# Patient Record
Sex: Female | Born: 1960
Health system: Southern US, Community
[De-identification: ages and names within clinical notes are randomized; demographics above are authoritative.]

## PROBLEM LIST (undated history)

## (undated) DIAGNOSIS — D563 Thalassemia minor: Secondary | ICD-10-CM

## (undated) DIAGNOSIS — E079 Disorder of thyroid, unspecified: Secondary | ICD-10-CM

## (undated) DIAGNOSIS — I10 Essential (primary) hypertension: Secondary | ICD-10-CM

## (undated) DIAGNOSIS — H409 Unspecified glaucoma: Secondary | ICD-10-CM

## (undated) DIAGNOSIS — T7840XA Allergy, unspecified, initial encounter: Secondary | ICD-10-CM

## (undated) DIAGNOSIS — E119 Type 2 diabetes mellitus without complications: Secondary | ICD-10-CM

## (undated) DIAGNOSIS — R7303 Prediabetes: Secondary | ICD-10-CM

## (undated) DIAGNOSIS — D249 Benign neoplasm of unspecified breast: Secondary | ICD-10-CM

## (undated) DIAGNOSIS — M1990 Unspecified osteoarthritis, unspecified site: Secondary | ICD-10-CM

## (undated) DIAGNOSIS — R7611 Nonspecific reaction to tuberculin skin test without active tuberculosis: Secondary | ICD-10-CM

## (undated) DIAGNOSIS — G4733 Obstructive sleep apnea (adult) (pediatric): Secondary | ICD-10-CM

## (undated) HISTORY — DX: Nonspecific reaction to tuberculin skin test without active tuberculosis: R76.11

## (undated) HISTORY — DX: Unspecified glaucoma: H40.9

## (undated) HISTORY — DX: Obstructive sleep apnea (adult) (pediatric): G47.33

## (undated) HISTORY — PX: TUBAL LIGATION: SHX77

## (undated) HISTORY — PX: GALLBLADDER SURGERY: SHX652

## (undated) HISTORY — DX: Disorder of thyroid, unspecified: E07.9

## (undated) HISTORY — PX: OTHER SURGICAL HISTORY: SHX169

## (undated) HISTORY — DX: Allergy, unspecified, initial encounter: T78.40XA

## (undated) HISTORY — DX: Type 2 diabetes mellitus without complications: E11.9

## (undated) HISTORY — DX: Essential (primary) hypertension: I10

## (undated) HISTORY — DX: Benign neoplasm of unspecified breast: D24.9

## (undated) HISTORY — DX: Thalassemia minor: D56.3

## (undated) HISTORY — PX: LAPAROSCOPY: SHX197

## (undated) HISTORY — PX: COLONOSCOPY: SHX174

## (undated) HISTORY — PX: JOINT REPLACEMENT: SHX530

---

## 1987-07-03 HISTORY — PX: ABDOMINAL HYSTERECTOMY: SHX81

## 1988-07-02 HISTORY — PX: TUBAL LIGATION: SHX77

## 2011-07-03 HISTORY — PX: ABDOMINAL HYSTERECTOMY: SHX81

## 2011-07-03 HISTORY — PX: BREAST BIOPSY: SHX20

## 2012-09-19 LAB — HM COLONOSCOPY

## 2013-07-02 HISTORY — PX: CHOLECYSTECTOMY: SHX55

## 2014-05-31 ENCOUNTER — Encounter: Payer: Self-pay | Admitting: Nurse Practitioner

## 2014-09-20 ENCOUNTER — Encounter: Payer: Self-pay | Admitting: Nurse Practitioner

## 2014-10-01 HISTORY — PX: BUNIONECTOMY: SHX129

## 2015-07-03 HISTORY — PX: CHOLECYSTECTOMY: SHX55

## 2015-10-18 ENCOUNTER — Encounter: Payer: Self-pay | Admitting: Nurse Practitioner

## 2016-04-01 DIAGNOSIS — R51 Headache: Secondary | ICD-10-CM | POA: Diagnosis not present

## 2016-04-01 DIAGNOSIS — I10 Essential (primary) hypertension: Secondary | ICD-10-CM | POA: Diagnosis not present

## 2016-05-17 ENCOUNTER — Telehealth: Payer: Self-pay | Admitting: Nurse Practitioner

## 2016-05-17 NOTE — Telephone Encounter (Signed)
Routing to charlotte----please advise, thanks 

## 2016-05-17 NOTE — Telephone Encounter (Signed)
I will not be able to provide any refill without an office visit. You can be scheduled tomorrow at 8:30am or 3pm. Thank you.

## 2016-05-17 NOTE — Telephone Encounter (Signed)
Patient has scheduled appt for 8:30am on 11/17 (tomorrow)----routing to charlotte---fyi...patient also bringing records from other office visit in case you need them

## 2016-05-17 NOTE — Telephone Encounter (Signed)
Patient states she has ran out of medication and has gone to ER b/c she has ran out of BP medication.  Patient's appt is not set to establish until 12/1.  Patient would like to know if Baldo Ash could send script for:  lisinopril HCTZ 20-25 mg Patient is [facility].

## 2016-05-18 ENCOUNTER — Encounter: Payer: Self-pay | Admitting: Nurse Practitioner

## 2016-05-18 ENCOUNTER — Other Ambulatory Visit (INDEPENDENT_AMBULATORY_CARE_PROVIDER_SITE_OTHER): Payer: 59

## 2016-05-18 ENCOUNTER — Other Ambulatory Visit: Payer: Self-pay | Admitting: *Deleted

## 2016-05-18 ENCOUNTER — Ambulatory Visit (INDEPENDENT_AMBULATORY_CARE_PROVIDER_SITE_OTHER): Payer: 59 | Admitting: Nurse Practitioner

## 2016-05-18 DIAGNOSIS — E039 Hypothyroidism, unspecified: Secondary | ICD-10-CM

## 2016-05-18 DIAGNOSIS — I10 Essential (primary) hypertension: Secondary | ICD-10-CM | POA: Diagnosis not present

## 2016-05-18 LAB — COMPREHENSIVE METABOLIC PANEL
ALBUMIN: 3.9 g/dL (ref 3.5–5.2)
ALT: 8 U/L (ref 0–35)
AST: 13 U/L (ref 0–37)
Alkaline Phosphatase: 75 U/L (ref 39–117)
BUN: 6 mg/dL (ref 6–23)
CHLORIDE: 107 meq/L (ref 96–112)
CO2: 30 meq/L (ref 19–32)
CREATININE: 0.79 mg/dL (ref 0.40–1.20)
Calcium: 9.1 mg/dL (ref 8.4–10.5)
GFR: 97.2 mL/min (ref >60.00)
Glucose, Bld: 95 mg/dL (ref 70–99)
Potassium: 3.5 mEq/L (ref 3.5–5.1)
SODIUM: 144 meq/L (ref 135–145)
Total Bilirubin: 0.3 mg/dL (ref 0.2–1.2)
Total Protein: 7.3 g/dL (ref 6.0–8.3)

## 2016-05-18 LAB — TSH: TSH: 2.43 u[IU]/mL (ref 0.35–4.50)

## 2016-05-18 LAB — T4, FREE: Free T4: 1.01 ng/dL (ref 0.60–1.60)

## 2016-05-18 MED ORDER — CLONIDINE HCL 0.1 MG PO TABS
0.2000 mg | ORAL_TABLET | Freq: Once | ORAL | Status: AC
  Administered 2016-05-18: 0.2 mg via ORAL

## 2016-05-18 MED ORDER — METOPROLOL SUCCINATE ER 50 MG PO TB24: 50.0000 mg | ORAL_TABLET | Freq: Every day | ORAL | 0 refills | Status: DC

## 2016-05-18 MED ORDER — POTASSIUM CHLORIDE ER 10 MEQ PO TBCR: 10.0000 meq | EXTENDED_RELEASE_TABLET | Freq: Every day | ORAL | 0 refills | Status: DC

## 2016-05-18 MED ORDER — LISINOPRIL-HYDROCHLOROTHIAZIDE 20-25 MG PO TABS: 1.0000 | ORAL_TABLET | Freq: Every day | ORAL | 0 refills | Status: DC

## 2016-05-18 MED ORDER — LEVOTHYROXINE SODIUM 50 MCG PO TABS: 50.0000 ug | ORAL_TABLET | Freq: Every day | ORAL | 0 refills | Status: DC

## 2016-05-18 MED FILL — LISINOPRIL-HCTZ 20-25 MG TA: 20-25 | 90 days supply | Qty: 90 | Fill #0

## 2016-05-18 MED FILL — METOPROLOL SUCC ER 50 MG TA: 50 | 90 days supply | Qty: 90 | Fill #0

## 2016-05-18 MED FILL — LEVOTHYROXINE 50 MCG TABLET: 50 | 90 days supply | Qty: 90 | Fill #0

## 2016-05-18 NOTE — Progress Notes (Signed)
Subjective:  Patient ID: Shelly Cohen, female    DOB: 10/19/1960  Age: 55 y.o. MRN: [id number]  CC: Medication Refill (Pt stated need refill on medication for blood pressure)  Moved to [place] from [place] ago. Last seen by pcp in [place] 665monthago : Dr. MiOctavio Graves Hypertension  This is a chronic problem. The current episode started more than 1 year ago. The problem has been gradually worsening since onset. The problem is uncontrolled. Associated symptoms include headaches. Pertinent negatives include no anxiety, blurred vision, chest pain, malaise/fatigue, neck pain, orthopnea, palpitations, peripheral edema, PND or shortness of breath. Agents associated with hypertension include thyroid hormones. Risk factors for coronary artery disease include family history. Past treatments include ACE inhibitors and diuretics. The current treatment provides significant improvement. There are no compliance problems.  Hypertensive end-organ damage includes a thyroid problem. There is no history of a hypertension causing med.  Thyroid Problem  Presents for follow-up visit. Patient reports no anxiety, depressed mood, leg swelling, palpitations, tremors or visual change. The symptoms have been stable.   Has been out of medications for 1week.  Outpatient Medications Prior to Visit  Medication Sig Dispense Refill  . ferrous sulfate 325 (65 FE) MG tablet Take by mouth.    . levothyroxine (SYNTHROID, LEVOTHROID) 50 MCG tablet Take by mouth.    . potassium chloride (K-DUR) 10 MEQ tablet Take by mouth.     No facility-administered medications prior to visit.     ROS See HPI  Social History   Social History  . Marital status: Married    Spouse name: N/A  . Number of children: N/A  . Years of education: N/A   Social History Main Topics  . Smoking status: Never Smoker  . Smokeless tobacco: Never Used  . Alcohol use No  . Drug use: No  . Sexual activity: Yes    Birth control/ protection:  Surgical   Other Topics Concern  . None   Social History Narrative  . None   Family History  Problem Relation Age of Onset  . Hypertension Mother   . Anemia Mother   . Arthritis Mother   . Kidney disease Mother   . Hypertension Sister   . Hypertension Brother   . Cancer Maternal Grandmother   . Cancer Maternal Grandfather    Last colonoscopy: 2016 Pneumovax: 2017 Tetanus: 2017 Last eye exam 10/2015 Last Mammogram: 2015  Objective:  BP (!) 182/116 (BP Location: Left Arm, Patient Position: Sitting, Cuff Size: Normal)   Pulse 86   Temp 97.8 F (36.6 C)   Ht '5\' 2"'$  (1.575 m)   Wt 204 lb (92.5 kg)   SpO2 95%   BMI 37.31 kg/m   BP Readings from Last 3 Encounters:  05/18/16 (!) 182/116    Wt Readings from Last 3 Encounters:  05/18/16 204 lb (92.5 kg)    Physical Exam  Constitutional: She is oriented to person, place, and time. No distress.  Eyes: Conjunctivae and EOM are normal. Pupils are equal, round, and reactive to light.  Neck: Normal range of motion.  Cardiovascular: Normal rate, regular rhythm and normal heart sounds.   Pulmonary/Chest: Effort normal and breath sounds normal.  Musculoskeletal: She exhibits no edema.  Neurological: She is alert and oriented to person, place, and time.  Skin: Skin is warm and dry.  Vitals reviewed.   No results found for: WBC, HGB, HCT, PLT, GLUCOSE, CHOL, TRIG, HDL, LDLDIRECT, LDLCALC, ALT, AST, NA, K, CL, CREATININE, BUN, CO2,  TSH, PSA, INR, GLUF, HGBA1C, MICROALBUR  Patient was never admitted.  Assessment & Plan:   Yehudit was seen today for medication refill.  Diagnoses and all orders for this visit:  Essential hypertension, malignant -     cloNIDine (CATAPRES) tablet 0.2 mg; Take 2 tablets (0.2 mg total) by mouth once. -     lisinopril-hydrochlorothiazide (PRINZIDE,ZESTORETIC) 20-25 MG tablet; Take 1 tablet by mouth daily. -     metoprolol succinate (TOPROL-XL) 50 MG 24 hr tablet; Take 1 tablet (50 mg total) by  mouth daily. Take with or immediately following a meal. -     Comp Met (CMET); Future -     potassium chloride (K-DUR) 10 MEQ tablet; Take 1 tablet (10 mEq total) by mouth daily.  Hypothyroidism, unspecified type -     TSH; Future -     T4, free; Future -     levothyroxine (SYNTHROID, LEVOTHROID) 50 MCG tablet; Take 1 tablet (50 mcg total) by mouth daily before breakfast.   I have discontinued Ms. Harris-Coley's Naltrexone-Bupropion HCl ER. I have also changed her potassium chloride and levothyroxine. Additionally, I am having her start on lisinopril-hydrochlorothiazide and metoprolol succinate. Lastly, I am having her maintain her ferrous sulfate. We administered cloNIDine.  Meds ordered this encounter  Medications  . cloNIDine (CATAPRES) tablet 0.2 mg  . lisinopril-hydrochlorothiazide (PRINZIDE,ZESTORETIC) 20-25 MG tablet    Sig: Take 1 tablet by mouth daily.    Dispense:  90 tablet    Refill:  0    Order Specific Question:   Supervising Provider    Answer:   Cassandria Anger [[id number]]  . metoprolol succinate (TOPROL-XL) 50 MG 24 hr tablet    Sig: Take 1 tablet (50 mg total) by mouth daily. Take with or immediately following a meal.    Dispense:  90 tablet    Refill:  0    Order Specific Question:   Supervising Provider    Answer:   Cassandria Anger [[id number]]  . potassium chloride (K-DUR) 10 MEQ tablet    Sig: Take 1 tablet (10 mEq total) by mouth daily.    Dispense:  90 tablet    Refill:  0    Order Specific Question:   Supervising Provider    Answer:   Cassandria Anger [[id number]]  . levothyroxine (SYNTHROID, LEVOTHROID) 50 MCG tablet    Sig: Take 1 tablet (50 mcg total) by mouth daily before breakfast.    Dispense:  90 tablet    Refill:  0    Order Specific Question:   Supervising Provider    Answer:   Cassandria Anger [[id number]]    Follow-up: Return in about 2 weeks (around 06/01/2016) for HTN.  Wilfred Lacy, NP

## 2016-05-18 NOTE — Progress Notes (Signed)
Pre visit review using our clinic review tool, if applicable. No additional management support is needed unless otherwise documented below in the visit note. 

## 2016-05-18 NOTE — Progress Notes (Signed)
Normal results

## 2016-05-18 NOTE — Patient Instructions (Signed)
Sign medical release to get records from previous pcp.  Check BP (once a day) at home and record. Bring BP records to next office visit.  Go to basement for lab draw today. You will be called with lab results.   Hypertension Hypertension is another name for high blood pressure. High blood pressure forces your heart to work harder to pump blood. A blood pressure reading has two numbers, which includes a higher number over a lower number (example: 110/72). Follow these instructions at home:  Have your blood pressure rechecked by your doctor.  Only take medicine as told by your doctor. Follow the directions carefully. The medicine does not work as well if you skip doses. Skipping doses also puts you at risk for problems.  Do not smoke.  Monitor your blood pressure at home as told by your doctor. Contact a doctor if:  You think you are having a reaction to the medicine you are taking.  You have repeat headaches or feel dizzy.  You have puffiness (swelling) in your ankles.  You have trouble with your vision. Get help right away if:  You get a very bad headache and are confused.  You feel weak, numb, or faint.  You get chest or belly (abdominal) pain.  You throw up (vomit).  You cannot breathe very well. This information is not intended to replace advice given to you by your health care provider. Make sure you discuss any questions you have with your health care provider. Document Released: 12/05/2007 Document Revised: 11/24/2015 Document Reviewed: 04/10/2013 Elsevier Interactive Patient Education  2017 [employer].

## 2016-05-18 NOTE — Progress Notes (Unsigned)
Pre visit review using our clinic review tool, if applicable. No additional management support is needed unless otherwise documented below in the visit note. 

## 2016-06-01 ENCOUNTER — Telehealth: Payer: Self-pay

## 2016-06-01 ENCOUNTER — Ambulatory Visit: Payer: Self-pay | Admitting: Nurse Practitioner

## 2016-06-01 ENCOUNTER — Other Ambulatory Visit: Payer: Self-pay

## 2016-06-01 ENCOUNTER — Ambulatory Visit: Payer: 59

## 2016-06-01 VITALS — BP 124/68

## 2016-06-01 DIAGNOSIS — Z0130 Encounter for examination of blood pressure without abnormal findings: Secondary | ICD-10-CM

## 2016-06-01 DIAGNOSIS — I10 Essential (primary) hypertension: Secondary | ICD-10-CM

## 2016-06-01 MED ORDER — LISINOPRIL-HYDROCHLOROTHIAZIDE 20-25 MG PO TABS: 0.5000 | ORAL_TABLET | Freq: Every day | ORAL | 0 refills | Status: DC

## 2016-06-01 NOTE — Telephone Encounter (Signed)
Left message asking patient to call back and talk with Essense Bousquet -----let Kihanna Kamiya know when patient calls so that I can talk with her NOW----she can either come back for bp recheck (nurse visit) today or Monday, or I can get her moved up sooner for "get establish" appt with charlotte before 12/29---per charlotte, ok to block 3 acute visits on Monday (or one day next week convenient for patient)----can talk with Kitti Mcclish if any questions

## 2016-06-01 NOTE — Telephone Encounter (Signed)
Patient came in on nurse visit for bp recheck, today's reading is 124/68----her trends (recorded at home) are as follows:  127/66, 140/80, 147/67, 134/69, 124/73, 121/73, 93/71, 108/64, 116/71, 102/57, 98/51, 95/56, and 92/67----charlotte has been advised of readings and has given verbal order for patient to begin taking 1/2 tab of lisinopril once daily and continue metoprolol as already ordered---patient repeated back for understanding and also will use pill cutter to 1/2 tablet appropriately---patient is to continue recording bp readings and call us back if she continues to drop too low or feel too tired before she is seen for follow up appt on 12/29---can talk with Yerania Chamorro if any questions

## 2016-06-29 ENCOUNTER — Encounter: Payer: Self-pay | Admitting: Nurse Practitioner

## 2016-06-29 ENCOUNTER — Other Ambulatory Visit (INDEPENDENT_AMBULATORY_CARE_PROVIDER_SITE_OTHER): Payer: 59

## 2016-06-29 ENCOUNTER — Ambulatory Visit (INDEPENDENT_AMBULATORY_CARE_PROVIDER_SITE_OTHER): Payer: 59 | Admitting: Nurse Practitioner

## 2016-06-29 VITALS — BP 112/62 | HR 79 | Temp 97.8°F | Resp 16 | Ht 62.0 in | Wt 207.0 lb

## 2016-06-29 DIAGNOSIS — E039 Hypothyroidism, unspecified: Secondary | ICD-10-CM | POA: Diagnosis not present

## 2016-06-29 DIAGNOSIS — I10 Essential (primary) hypertension: Secondary | ICD-10-CM

## 2016-06-29 DIAGNOSIS — D509 Iron deficiency anemia, unspecified: Secondary | ICD-10-CM | POA: Diagnosis not present

## 2016-06-29 DIAGNOSIS — M79674 Pain in right toe(s): Secondary | ICD-10-CM | POA: Insufficient documentation

## 2016-06-29 DIAGNOSIS — N6320 Unspecified lump in the left breast, unspecified quadrant: Secondary | ICD-10-CM | POA: Diagnosis not present

## 2016-06-29 DIAGNOSIS — Z713 Dietary counseling and surveillance: Secondary | ICD-10-CM

## 2016-06-29 DIAGNOSIS — Z9889 Other specified postprocedural states: Secondary | ICD-10-CM | POA: Insufficient documentation

## 2016-06-29 DIAGNOSIS — D563 Thalassemia minor: Secondary | ICD-10-CM

## 2016-06-29 LAB — CBC WITH DIFFERENTIAL/PLATELET
BASOS PCT: 0.7 % (ref 0.0–3.0)
Basophils Absolute: 0.1 10*3/uL (ref 0.0–0.1)
EOS ABS: 0.3 10*3/uL (ref 0.0–0.7)
EOS PCT: 2.9 % (ref 0.0–5.0)
HEMATOCRIT: 37.5 % (ref 36.0–46.0)
HEMOGLOBIN: 12.3 g/dL (ref 12.0–15.0)
LYMPHS PCT: 32.2 % (ref 12.0–46.0)
Lymphs Abs: 3.2 10*3/uL (ref 0.7–4.0)
MCHC: 32.8 g/dL (ref 30.0–36.0)
MCV: 78.5 fl (ref 78.0–100.0)
MONOS PCT: 6.2 % (ref 3.0–12.0)
Monocytes Absolute: 0.6 10*3/uL (ref 0.1–1.0)
Neutro Abs: 5.7 10*3/uL (ref 1.4–7.7)
Neutrophils Relative %: 58 % (ref 43.0–77.0)
Platelets: 334 10*3/uL (ref 150.0–400.0)
RBC: 4.77 Mil/uL (ref 3.87–5.11)
RDW: 17.6 % — AB (ref 11.5–15.5)
WBC: 9.9 10*3/uL (ref 4.0–10.5)

## 2016-06-29 LAB — IBC PANEL
IRON: 51 ug/dL (ref 42–145)
Saturation Ratios: 13.4 % — ABNORMAL LOW (ref 20.0–50.0)
Transferrin: 271 mg/dL (ref 212.0–360.0)

## 2016-06-29 LAB — FERRITIN: Ferritin: 74.6 ng/mL (ref 10.0–291.0)

## 2016-06-29 MED ORDER — PHENTERMINE HCL 15 MG PO CAPS: 15.0000 mg | ORAL_CAPSULE | ORAL | 2 refills | Status: DC

## 2016-06-29 MED FILL — POTASSIUM CL ER 10 MEQ TAB: 10 | 90 days supply | Qty: 90 | Fill #0

## 2016-06-29 MED FILL — PHENTERMINE 15 MG CAPSULE: 15 | 30 days supply | Qty: 30 | Fill #0

## 2016-06-29 NOTE — Progress Notes (Signed)
Subjective:    Patient ID: Shelly Cohen, female    DOB: July 05, 1960, 55 y.o.   MRN: [id number]  Patient presents today for establish care (new patient)   HPI BP Readings from Last 3 Encounters:  06/29/16 112/62  06/01/16 124/68  05/18/16 (!) 182/116   HTN: Controlled with BP medications. No edema, no Cp, no SOB, no headache, no palpitations, no change in vision.  Toe pain: S.p bunionectomy early this year while in [place]. Unable to follow up after surgery due to her move to [place]. She will like referral to local podiatrist.   Obesity: Unable to exercise properly due to toe pain post surgery. She has decreased portion size. No regular exercise at this time due to work schedule and cold weather. Tried contrave in past, but unable to tolerate due to headache. She will like to try phentermine.  Immunizations: (TDAP, Hep C screen, Pneumovax, Influenza, zoster)  Health Maintenance  Topic Date Due  . Mammogram  06/01/2016  . Colon Cancer Screening  09/20/2022  . Tetanus Vaccine  12/14/2025  . Flu Shot  Completed  .  Hepatitis C: One time screening is recommended by Center for Disease Control  (CDC) for  adults born from [birth year] through 1965.   Completed  . HIV Screening  Completed  . Pap Smear  Excluded   Diet:low fat and small portions Weight:  Wt Readings from Last 3 Encounters:  06/29/16 207 lb (93.9 kg)  05/18/16 204 lb (92.5 kg)   Exercise:walking occassionally Fall Risk: Fall Risk  05/18/2016  Falls in the past year? No   Home Safety:home with husband and daughter Depression/Suicide: Depression screen [facility] 2/9 05/18/2016  Decreased Interest 0  Down, Depressed, Hopeless 0  PHQ - 2 Score 0   No flowsheet data found. Colonoscopy (every 5-83yr, >50-763yr:up to date Mammogram (yearly, >4536yrup to date  Vision:up to date Dental:up to date Advanced Directive: Advanced Directives 05/18/2016  Does Patient Have a Medical Advance Directive? No  Would patient  like information on creating a medical advance directive? No - patient declined information   Sexual History (birth control, marital status, STD):married and sexually active  Medications and allergies reviewed with patient and updated if appropriate.  Patient Active Problem List   Diagnosis Date Noted  . Mass of breast, left 06/29/2016  . Thalassemia trait, alpha 06/29/2016  . Iron deficiency anemia 06/29/2016  . S/P bunionectomy 06/29/2016  . Great toe pain, right 06/29/2016  . Encounter for weight loss counseling 06/29/2016  . Essential hypertension, malignant 05/18/2016  . Hypothyroidism 05/18/2016    Current Outpatient Prescriptions on File Prior to Visit  Medication Sig Dispense Refill  . aspirin EC 81 MG tablet Take by mouth.    . ferrous sulfate 325 (65 FE) MG tablet Take by mouth.    . levothyroxine (SYNTHROID, LEVOTHROID) 50 MCG tablet Take 1 tablet (50 mcg total) by mouth daily before breakfast. 90 tablet 0  . lisinopril-hydrochlorothiazide (PRINZIDE,ZESTORETIC) 20-25 MG tablet Take 0.5 tablets by mouth daily. 90 tablet 0  . metoprolol succinate (TOPROL-XL) 50 MG 24 hr tablet Take 1 tablet (50 mg total) by mouth daily. Take with or immediately following a meal. 90 tablet 0  . Multiple Vitamin (MULTI-VITAMINS) TABS Take by mouth.    . Omega-3 Fatty Acids (FISH OIL PO) Take by mouth.    . potassium chloride (K-DUR) 10 MEQ tablet Take 1 tablet (10 mEq total) by mouth daily. 90 tablet 0   No current facility-administered medications on file prior  to visit.     Past Medical History:  Diagnosis Date  . Alpha thalassemia trait   . Fibroadenoma   . Glaucoma   . History of positive PPD, treatment status unknown   . Hypertension   . Thyroid disease     Past Surgical History:  Procedure Laterality Date  . ABDOMINAL HYSTERECTOMY  1989  . BREAST BIOPSY  2013  . BUNIONECTOMY  10/2014  . CHOLECYSTECTOMY  2017  . COLONOSCOPY    . GALLBLADDER SURGERY    . hallux limi    .  LAPAROSCOPY    . TUBAL LIGATION      Social History   Social History  . Marital status: Married    Spouse name: N/A  . Number of children: N/A  . Years of education: N/A   Social History Main Topics  . Smoking status: Never Smoker  . Smokeless tobacco: Never Used  . Alcohol use No  . Drug use: No  . Sexual activity: Yes    Birth control/ protection: Surgical   Other Topics Concern  . None   Social History Narrative  . None    Family History  Problem Relation Age of Onset  . Hypertension Mother   . Anemia Mother   . Arthritis Mother   . Kidney disease Mother   . Hypertension Sister   . Hypertension Brother   . Cancer Maternal Grandmother   . Cancer Maternal Grandfather    Hgb a1c of 5.7     Review of Systems  Constitutional: Negative for fever, malaise/fatigue and weight loss.  HENT: Negative for congestion and sore throat.   Eyes:       Negative for visual changes  Respiratory: Negative for cough and shortness of breath.   Cardiovascular: Negative for chest pain, palpitations and leg swelling.  Gastrointestinal: Negative for blood in stool, constipation, diarrhea and heartburn.  Genitourinary: Negative for dysuria, frequency and urgency.  Musculoskeletal: Positive for joint pain. Negative for falls and myalgias.  Skin: Negative for rash.  Neurological: Negative for dizziness, sensory change and headaches.  Endo/Heme/Allergies: Does not bruise/bleed easily.  Psychiatric/Behavioral: Negative for depression, substance abuse and suicidal ideas. The patient is not nervous/anxious.      Objective:   Vitals:   06/29/16 1359  BP: 112/62  Pulse: 79  Resp: 16  Temp: 97.8 F (36.6 C)    Body mass index is 37.86 kg/m.   Physical Examination:  Physical Exam  Constitutional: She is oriented to person, place, and time and well-developed, well-nourished, and in no distress. No distress.  HENT:  Right Ear: External ear normal.  Left Ear: External ear  normal.  Nose: Nose normal.  Mouth/Throat: Oropharynx is clear and moist. No oropharyngeal exudate.  Eyes: Conjunctivae and EOM are normal. Pupils are equal, round, and reactive to light. No scleral icterus.  Neck: Normal range of motion. Neck supple. No thyromegaly present.  Cardiovascular: Normal rate, normal heart sounds and intact distal pulses.   Pulmonary/Chest: Effort normal and breath sounds normal. She exhibits no tenderness.  Abdominal: Soft. Bowel sounds are normal. She exhibits no distension. There is no tenderness.  Musculoskeletal: She exhibits tenderness and deformity. She exhibits no edema.       Right foot: There is decreased range of motion, tenderness, bony tenderness, swelling and deformity. There is no laceration.       Feet:  Lymphadenopathy:    She has no cervical adenopathy.  Neurological: She is alert and oriented to person, place, and time.  Gait normal.  Skin: Skin is warm and dry.  Psychiatric: Affect and judgment normal.    ASSESSMENT and PLAN:  Shelly Cohen was seen today for establish care.  Diagnoses and all orders for this visit:  Essential hypertension  Mass of breast, left -     MM Digital Diagnostic [place]; Future -     US BREAST LTD UNI LEFT INC AXILLA; Future  Hypothyroidism, unspecified type  Iron deficiency anemia, unspecified iron deficiency anemia type -     CBC w/Diff; Future -     Ferritin; Future -     IBC panel; Future  S/P bunionectomy -     Ambulatory referral to Podiatry  Great toe pain, right -     Ambulatory referral to Podiatry  Encounter for weight loss counseling -     phentermine 15 MG capsule; Take 1 capsule (15 mg total) by mouth every morning.  Thalassemia trait, alpha   No problem-specific Assessment & Plan notes found for this encounter.     Recent Results (from the past 2160 hour(s))  Comp Met (CMET)     Status: None   Collection Time: 05/18/16  9:45 AM  Result Value Ref Range   Sodium 144 135 - 145 mEq/L    Potassium 3.5 3.5 - 5.1 mEq/L   Chloride 107 96 - 112 mEq/L   CO2 30 19 - 32 mEq/L   Glucose, Bld 95 70 - 99 mg/dL   BUN 6 6 - 23 mg/dL   Creatinine, Ser 0.79 0.40 - 1.20 mg/dL   Total Bilirubin 0.3 0.2 - 1.2 mg/dL   Alkaline Phosphatase 75 39 - 117 U/L   AST 13 0 - 37 U/L   ALT 8 0 - 35 U/L   Total Protein 7.3 6.0 - 8.3 g/dL   Albumin 3.9 3.5 - 5.2 g/dL   Calcium 9.1 8.4 - 10.5 mg/dL   GFR 97.20 >60.00 mL/min  TSH     Status: None   Collection Time: 05/18/16  9:45 AM  Result Value Ref Range   TSH 2.43 0.35 - 4.50 uIU/mL  T4, free     Status: None   Collection Time: 05/18/16  9:45 AM  Result Value Ref Range   Free T4 1.01 0.60 - 1.60 ng/dL    Comment: Specimens from patients who are undergoing biotin therapy and /or ingesting biotin supplements may contain high levels of biotin.  The higher biotin concentration in these specimens interferes with this Free T4 assay.  Specimens that contain high levels  of biotin may cause false high results for this Free T4 assay.  Please interpret results in light of the total clinical presentation of the patient.    CBC w/Diff     Status: Abnormal   Collection Time: 06/29/16  3:29 PM  Result Value Ref Range   WBC 9.9 4.0 - 10.5 K/uL   RBC 4.77 3.87 - 5.11 Mil/uL   Hemoglobin 12.3 12.0 - 15.0 g/dL   HCT 37.5 36.0 - 46.0 %   MCV 78.5 78.0 - 100.0 fl   MCHC 32.8 30.0 - 36.0 g/dL   RDW 17.6 (H) 11.5 - 15.5 %   Platelets 334.0 150.0 - 400.0 K/uL   Neutrophils Relative % 58.0 43.0 - 77.0 %   Lymphocytes Relative 32.2 12.0 - 46.0 %   Monocytes Relative 6.2 3.0 - 12.0 %   Eosinophils Relative 2.9 0.0 - 5.0 %   Basophils Relative 0.7 0.0 - 3.0 %   Neutro Abs 5.7  1.4 - 7.7 K/uL   Lymphs Abs 3.2 0.7 - 4.0 K/uL   Monocytes Absolute 0.6 0.1 - 1.0 K/uL   Eosinophils Absolute 0.3 0.0 - 0.7 K/uL   Basophils Absolute 0.1 0.0 - 0.1 K/uL  Ferritin     Status: None   Collection Time: 06/29/16  3:29 PM  Result Value Ref Range   Ferritin 74.6 10.0 -  291.0 ng/mL  IBC panel     Status: Abnormal   Collection Time: 06/29/16  3:29 PM  Result Value Ref Range   Iron 51 42 - 145 ug/dL   Transferrin 271.0 212.0 - 360.0 mg/dL   Saturation Ratios 13.4 (L) 20.0 - 50.0 %   Follow up: Return in about 3 months (around 09/27/2016) for weight loss, HTN and anemia.  Wilfred Lacy, NP

## 2016-06-29 NOTE — Progress Notes (Signed)
Pre visit review using our clinic review tool, if applicable. No additional management support is needed unless otherwise documented below in the visit note. 

## 2016-06-29 NOTE — Patient Instructions (Signed)

## 2016-07-03 ENCOUNTER — Encounter: Payer: Self-pay | Admitting: Nurse Practitioner

## 2016-07-05 ENCOUNTER — Other Ambulatory Visit: Payer: Self-pay | Admitting: Nurse Practitioner

## 2016-07-05 DIAGNOSIS — N6320 Unspecified lump in the left breast, unspecified quadrant: Secondary | ICD-10-CM

## 2016-07-10 ENCOUNTER — Encounter: Payer: Self-pay | Admitting: Podiatry

## 2016-07-10 ENCOUNTER — Ambulatory Visit (INDEPENDENT_AMBULATORY_CARE_PROVIDER_SITE_OTHER): Payer: 59

## 2016-07-10 ENCOUNTER — Ambulatory Visit (INDEPENDENT_AMBULATORY_CARE_PROVIDER_SITE_OTHER): Payer: 59 | Admitting: Podiatry

## 2016-07-10 VITALS — BP 111/70 | HR 95 | Resp 16

## 2016-07-10 DIAGNOSIS — M205X1 Other deformities of toe(s) (acquired), right foot: Secondary | ICD-10-CM

## 2016-07-10 DIAGNOSIS — T847XXA Infection and inflammatory reaction due to other internal orthopedic prosthetic devices, implants and grafts, initial encounter: Secondary | ICD-10-CM

## 2016-07-10 DIAGNOSIS — M2011 Hallux valgus (acquired), right foot: Secondary | ICD-10-CM | POA: Diagnosis not present

## 2016-07-10 NOTE — Progress Notes (Signed)
   Subjective:    Patient ID: Shelly Cohen, female    DOB: [birth year], 56 y.o.   MRN: [id number]  HPI: She presents today with a chief complaint of pain to the first metatarsophalangeal joint of the right foot 2 years. Had podiatric surgery with a hemi-joint replacement first metatarsal phalangeal joint right foot while living in [place]. She states that the toe locks and does not bend properly and has shortened considerably. She states this is painful anytime she is walking. She denies major past medical history other than underactive thyroid disease. She denies trauma to the foot.    Review of Systems  Musculoskeletal: Positive for arthralgias and myalgias.  All other systems reviewed and are negative.      Objective:   Physical Exam: Vital signs are stable she is alert and oriented 3 I reviewed her past mental history medications and allergies very pleasant nonurgent individual. She currently works for [employer]. Pulses are palpable bilateral. Neurologic sensorium is intact deep tendon reflexes are intact muscle strength symmetrical and equal bilateral. Orthopedic evaluation demonstrate limited range of motion of the first metatarsophalangeal joint of the right foot with a mild hallux malleus right. She has pain on palpation of the sesamoid apparatus and on attempted range of motion of the joint. Radiographic evaluation does demonstrate a well-placed distal hemi-implant with destruction of the head of the first metatarsal either intentional or mechanical. Cutaneous evaluation of Mr. supple well-hydrated cutis no signs of infection. Reactive postinflammatory hyperpigmentation with mild edema to the first metatarsophalangeal joint right foot.        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  Plan: Discussed etiology pathology concerned versus surgical therapies I expressed to her that this would require  surgical intervention she understands this and is amenable to it. I would like to refer her to Dr. Earleen Newport for second opinion should he feel this to be better corrected with a block of bone and an external fixator for fusion or whether or not a joint replacement with a single silicone implant may benefit her. I will follow-up with her in the near future if I am to do the surgery.

## 2016-07-20 ENCOUNTER — Ambulatory Visit (INDEPENDENT_AMBULATORY_CARE_PROVIDER_SITE_OTHER): Payer: 59 | Admitting: Podiatry

## 2016-07-20 DIAGNOSIS — T84498A Other mechanical complication of other internal orthopedic devices, implants and grafts, initial encounter: Secondary | ICD-10-CM

## 2016-07-20 DIAGNOSIS — M205X1 Other deformities of toe(s) (acquired), right foot: Secondary | ICD-10-CM | POA: Diagnosis not present

## 2016-07-26 NOTE — Progress Notes (Signed)
Subjective: 56 year old female presents the office they for second opinion and surgical consultation for her right foot. She appears he had a hemi-implant performed in her right first metatarsal phalangeal joint. She states that she's had pain with range of motion of the joint and she's having pain on daily basis to this joint it does not move. She has tried changing shoes as well as offloading without any significant improvement. Should discuss surgical intervention this time. Denies any systemic complaints such as fevers, chills, nausea, vomiting. No acute changes since last appointment, and no other complaints at this time.   Objective: AAO x3, NAD DP/PT pulses palpable bilaterally, CRT less than 3 seconds There is tenderness the right first metatarsal phalangeal joint and there is mild edema to this area. There is no erythema or increase in warmth. There is restriction of motion of the joint. There is no other area tenderness to bilateral lower extremity is. No open lesions or pre-ulcerative lesions.  No pain with calf compression, swelling, warmth, erythema  Assessment: 56 year old female right first metatarsal phalangeal joint hallux rigidus due to failed implant  Plan: -All treatment options discussed with the patient including all alternatives, risks, complications.  -X-rays and chart reviewed. I discussed with the patient both conservative and surgical treatment options. I recommended implant removal first metatarsal phalangeal joint fusion. Discussed that either bone grafting for possible Additive wedge plate. She said that she'll likely undergo surgery which led to hold off until January 2019. Discussed with her if symptoms continue in the meantime we may need to have surgery earlier. -Discussed the graphite insert but she believes that she may have this at home. If not she'll come back to the office to pick this up. -Patient encouraged to call the office with any questions, concerns,  change in symptoms.   Celesta Gentile, DPM

## 2016-07-30 MED FILL — PHENTERMINE 15 MG CAPSULE: 15 | 30 days supply | Qty: 30 | Fill #1

## 2016-08-02 ENCOUNTER — Encounter: Payer: Self-pay | Admitting: Nurse Practitioner

## 2016-08-02 DIAGNOSIS — Z713 Dietary counseling and surveillance: Secondary | ICD-10-CM

## 2016-08-08 ENCOUNTER — Ambulatory Visit
Admission: RE | Admit: 2016-08-08 | Discharge: 2016-08-08 | Disposition: A | Discharge status: Home / Self Care | Payer: 59 | Source: Ambulatory Visit | Attending: Nurse Practitioner | Admitting: Nurse Practitioner

## 2016-08-08 DIAGNOSIS — N6320 Unspecified lump in the left breast, unspecified quadrant: Secondary | ICD-10-CM

## 2016-08-08 DIAGNOSIS — R928 Other abnormal and inconclusive findings on diagnostic imaging of breast: Secondary | ICD-10-CM | POA: Diagnosis not present

## 2016-09-04 ENCOUNTER — Other Ambulatory Visit: Payer: Self-pay | Admitting: Nurse Practitioner

## 2016-09-04 ENCOUNTER — Encounter: Payer: Self-pay | Admitting: Nurse Practitioner

## 2016-09-04 DIAGNOSIS — I10 Essential (primary) hypertension: Secondary | ICD-10-CM

## 2016-09-04 DIAGNOSIS — Z713 Dietary counseling and surveillance: Secondary | ICD-10-CM

## 2016-09-04 MED ORDER — LISINOPRIL-HYDROCHLOROTHIAZIDE 20-25 MG PO TABS: 0.5000 | ORAL_TABLET | Freq: Every day | ORAL | 0 refills | Status: DC

## 2016-09-04 MED ORDER — PHENTERMINE HCL 15 MG PO CAPS: 15.0000 mg | ORAL_CAPSULE | ORAL | 0 refills | Status: DC

## 2016-09-05 ENCOUNTER — Telehealth: Payer: Self-pay | Admitting: Nurse Practitioner

## 2016-09-05 NOTE — Telephone Encounter (Signed)
Patient is going to call back to set up a nurse visit she has to get with the person who gives her a ride.

## 2016-09-05 NOTE — Telephone Encounter (Signed)
Shelly Cohen, will keep rx at my desk.

## 2016-09-05 NOTE — Telephone Encounter (Signed)
Left vm for pt to call back, rx for Phentermine is ready but we need nurse visit prior to pick this med for BP and HR reading.

## 2016-09-13 ENCOUNTER — Ambulatory Visit: Payer: 59 | Admitting: General Practice

## 2016-09-13 ENCOUNTER — Other Ambulatory Visit: Payer: Self-pay | Admitting: General Practice

## 2016-09-13 VITALS — BP 148/74

## 2016-09-13 DIAGNOSIS — I10 Essential (primary) hypertension: Secondary | ICD-10-CM

## 2016-09-13 MED ORDER — METOPROLOL SUCCINATE ER 50 MG PO TB24: 50.0000 mg | ORAL_TABLET | Freq: Every day | ORAL | 0 refills | Status: DC

## 2016-09-13 MED FILL — METOPROLOL SUCC ER 50 MG TA: 50 | 30 days supply | Qty: 30 | Fill #0

## 2016-09-13 MED FILL — PHENTERMINE 15 MG CAPSULE: 15 | 30 days supply | Qty: 30 | Fill #2

## 2016-09-13 NOTE — Telephone Encounter (Signed)
Spoke with [facility] pharmacy rep,verify that they have phentermine ready for her to pick up.

## 2016-09-14 MED FILL — LISINOPRIL-HCTZ 20-25 MG TA: 20-25 | 60 days supply | Qty: 30 | Fill #0

## 2016-09-14 NOTE — Progress Notes (Signed)
Please advise patient to maintain upcoming appt 10/05/16. Yesterday's BP is slightly elevated.  BP Readings from Last 3 Encounters:  09/13/16 (!) 148/74  07/10/16 111/70  06/29/16 112/62    She also needs to monitor BP at home (once a day). If BP is > 150/80 for more than 2days, she needs to make an appt to seen sooner. Thank you.

## 2016-09-14 NOTE — Progress Notes (Addendum)
Left another vm for pt to call back, need to inform [facility] and ask about med. 09/17/16 PN    Left vm for pt to call back, need to inform [facility] and ask about Phentamine.

## 2016-09-17 MED ORDER — POTASSIUM CHLORIDE ER 10 MEQ PO TBCR: 10.0000 meq | EXTENDED_RELEASE_TABLET | Freq: Every day | ORAL | 0 refills | Status: DC

## 2016-09-17 NOTE — Addendum Note (Signed)
Addended byShawnie Pons on: 09/17/2016 05:01 PM   Modules accepted: Orders

## 2016-09-27 ENCOUNTER — Ambulatory Visit: Payer: 59 | Admitting: *Deleted

## 2016-10-05 ENCOUNTER — Ambulatory Visit (INDEPENDENT_AMBULATORY_CARE_PROVIDER_SITE_OTHER): Payer: 59 | Admitting: Nurse Practitioner

## 2016-10-05 ENCOUNTER — Encounter: Payer: Self-pay | Admitting: Nurse Practitioner

## 2016-10-05 VITALS — BP 126/80 | HR 72 | Temp 97.7°F | Ht 62.0 in | Wt 203.0 lb

## 2016-10-05 DIAGNOSIS — Z713 Dietary counseling and surveillance: Secondary | ICD-10-CM | POA: Diagnosis not present

## 2016-10-05 DIAGNOSIS — E039 Hypothyroidism, unspecified: Secondary | ICD-10-CM | POA: Diagnosis not present

## 2016-10-05 DIAGNOSIS — I10 Essential (primary) hypertension: Secondary | ICD-10-CM

## 2016-10-05 MED ORDER — POTASSIUM CHLORIDE ER 10 MEQ PO TBCR
10.0000 meq | EXTENDED_RELEASE_TABLET | Freq: Every day | ORAL | 0 refills | Status: DC
Start: 2016-10-05 — End: 2017-01-04

## 2016-10-05 MED ORDER — METOPROLOL SUCCINATE ER 50 MG PO TB24: 50.0000 mg | ORAL_TABLET | Freq: Every day | ORAL | 0 refills | Status: DC

## 2016-10-05 MED ORDER — PHENTERMINE HCL 15 MG PO CAPS: 15.0000 mg | ORAL_CAPSULE | ORAL | 2 refills | Status: DC

## 2016-10-05 MED ORDER — LISINOPRIL-HYDROCHLOROTHIAZIDE 10-12.5 MG PO TABS: 1.0000 | ORAL_TABLET | Freq: Every day | ORAL | 0 refills | Status: DC

## 2016-10-05 MED ORDER — LEVOTHYROXINE SODIUM 50 MCG PO TABS: 50.0000 ug | ORAL_TABLET | Freq: Every day | ORAL | 0 refills | Status: DC

## 2016-10-05 MED FILL — POTASSIUM CL ER 10 MEQ TAB: 10 | 90 days supply | Qty: 90 | Fill #0

## 2016-10-05 MED FILL — LEVOTHYROXINE 50 MCG TABLET: 50 | 90 days supply | Qty: 90 | Fill #0

## 2016-10-05 NOTE — Patient Instructions (Signed)

## 2016-10-05 NOTE — Progress Notes (Signed)
Subjective:  Patient ID: Shelly Cohen, female    DOB: 1961-05-24  Age: 56 y.o. MRN: [id number]  CC: Follow-up (3 follow up BP. got list of BP. )   HPI Weight loss counseling: Has not changed diet. Unable to exercise due to foot pain. Take phentermine as prescribed. Denies any adverse effects.  HTN: Home BP reading ranges from 98/61 to 129/80. No dizziness, no palpitations, no headache, no CP, no edema.  Outpatient Medications Prior to Visit  Medication Sig Dispense Refill  . aspirin EC 81 MG tablet Take by mouth.    . ferrous sulfate 325 (65 FE) MG tablet Take by mouth.    . Multiple Vitamin (MULTI-VITAMINS) TABS Take by mouth.    . Omega-3 Fatty Acids (FISH OIL PO) Take by mouth.    . levothyroxine (SYNTHROID, LEVOTHROID) 50 MCG tablet Take 1 tablet (50 mcg total) by mouth daily before breakfast. 90 tablet 0  . lisinopril-hydrochlorothiazide (PRINZIDE,ZESTORETIC) 20-25 MG tablet Take 0.5 tablets by mouth daily. Follow-up appt is due must have appt for future refills 30 tablet 0  . metoprolol succinate (TOPROL-XL) 50 MG 24 hr tablet Take 1 tablet (50 mg total) by mouth daily. Take with or immediately following a meal. 30 tablet 0  . phentermine 15 MG capsule Take 1 capsule (15 mg total) by mouth every morning. 30 capsule 0  . potassium chloride (K-DUR) 10 MEQ tablet Take 1 tablet (10 mEq total) by mouth daily. 30 tablet 0   No facility-administered medications prior to visit.     ROS See HPI  Objective:  BP 126/80   Pulse 72   Temp 97.7 F (36.5 C)   Ht 5\' 2"  (1.575 m)   Wt 203 lb (92.1 kg)   SpO2 99%   BMI 37.13 kg/m   BP Readings from Last 3 Encounters:  10/05/16 126/80  09/13/16 (!) 148/74  07/10/16 111/70    Wt Readings from Last 3 Encounters:  10/05/16 203 lb (92.1 kg)  06/29/16 207 lb (93.9 kg)  05/18/16 204 lb (92.5 kg)    Physical Exam  Constitutional: She is oriented to person, place, and time. No distress.  Neck: Normal range of motion.  Neck supple.  Cardiovascular: Normal rate, regular rhythm and normal heart sounds.   Pulmonary/Chest: Effort normal and breath sounds normal. No respiratory distress.  Musculoskeletal: Normal range of motion. She exhibits no edema.  Neurological: She is alert and oriented to person, place, and time.  Skin: Skin is warm and dry.  Psychiatric: She has a normal mood and affect. Her behavior is normal.    Lab Results  Component Value Date   WBC 9.9 06/29/2016   HGB 12.3 06/29/2016   HCT 37.5 06/29/2016   PLT 334.0 06/29/2016   GLUCOSE 95 05/18/2016   ALT 8 05/18/2016   AST 13 05/18/2016   NA 144 05/18/2016   K 3.5 05/18/2016   CL 107 05/18/2016   CREATININE 0.79 05/18/2016   BUN 6 05/18/2016   CO2 30 05/18/2016   TSH 2.43 05/18/2016    Mm Diag Breast Tomo Bilateral  Result Date: 08/08/2016 CLINICAL DATA:  56 year old female presenting for follow-up status post benign left breast biopsy. The patient had a left breast biopsy in 2013 at an outside facility with pathology results indicating a benign fibroadenoma. The patient states that following the biopsy, the radiologist recommended a follow-up.The patient is asymptomatic today. EXAM: 2D DIGITAL DIAGNOSTIC BILATERAL MAMMOGRAM WITH CAD AND ADJUNCT TOMO COMPARISON:  Previous exam(s). ACR Breast  Density Category b: There are scattered areas of fibroglandular density. FINDINGS: The biopsy marking clip is identified in the lower-inner quadrant of the left breast. There are no suspicious mammographic findings at the site of the biopsy. No suspicious calcifications, masses or areas of distortion are seen in the bilateral breasts. Mammographic images were processed with CAD. IMPRESSION: 1.  Stable left breast biopsy site. 2.  No mammographic evidence of malignancy in the bilateral breasts. RECOMMENDATION: Screening mammogram in one year.(Code:[id number]) I have discussed the findings and recommendations with the patient. Results were also provided in  writing at the conclusion of the visit. If applicable, a reminder letter will be sent to the patient regarding the next appointment. BI-RADS CATEGORY  2: Benign. Electronically Signed   By: Ammie Ferrier M.D.   On: 08/08/2016 08:47    Assessment & Plan:   Shelly Cohen was seen today for follow-up.  Diagnoses and all orders for this visit:  HTN (hypertension), benign  Hypothyroidism, unspecified type -     levothyroxine (SYNTHROID, LEVOTHROID) 50 MCG tablet; Take 1 tablet (50 mcg total) by mouth daily before breakfast.  Encounter for weight loss counseling -     phentermine 15 MG capsule; Take 1 capsule (15 mg total) by mouth every morning.  Essential hypertension, malignant -     metoprolol succinate (TOPROL-XL) 50 MG 24 hr tablet; Take 1 tablet (50 mg total) by mouth daily. Take with or immediately following a meal. -     potassium chloride (K-DUR) 10 MEQ tablet; Take 1 tablet (10 mEq total) by mouth daily. -     lisinopril-hydrochlorothiazide (PRINZIDE,ZESTORETIC) 10-12.5 MG tablet; Take 1 tablet by mouth daily.   I have discontinued Shelly Cohen's lisinopril-hydrochlorothiazide. I am also having her start on lisinopril-hydrochlorothiazide. Additionally, I am having her maintain her MULTI-VITAMINS, ferrous sulfate, aspirin EC, Omega-3 Fatty Acids (FISH OIL PO), metoprolol succinate, levothyroxine, phentermine, and potassium chloride.  Meds ordered this encounter  Medications  . metoprolol succinate (TOPROL-XL) 50 MG 24 hr tablet    Sig: Take 1 tablet (50 mg total) by mouth daily. Take with or immediately following a meal.    Dispense:  90 tablet    Refill:  0    Order Specific Question:   Supervising Provider    Answer:   Cassandria Anger [[id number]]  . levothyroxine (SYNTHROID, LEVOTHROID) 50 MCG tablet    Sig: Take 1 tablet (50 mcg total) by mouth daily before breakfast.    Dispense:  90 tablet    Refill:  0    Order Specific Question:   Supervising Provider    Answer:    Cassandria Anger [[id number]]  . phentermine 15 MG capsule    Sig: Take 1 capsule (15 mg total) by mouth every morning.    Dispense:  30 capsule    Refill:  2    Order Specific Question:   Supervising Provider    Answer:   Cassandria Anger [[id number]]  . potassium chloride (K-DUR) 10 MEQ tablet    Sig: Take 1 tablet (10 mEq total) by mouth daily.    Dispense:  90 tablet    Refill:  0    Order Specific Question:   Supervising Provider    Answer:   Cassandria Anger [[id number]]  . lisinopril-hydrochlorothiazide (PRINZIDE,ZESTORETIC) 10-12.5 MG tablet    Sig: Take 1 tablet by mouth daily.    Dispense:  90 tablet    Refill:  0    Order Specific Question:   Supervising Provider  Answer:   Cassandria Anger [[id number]]    Follow-up: Return in about 3 months (around 01/04/2017) for CPE (fasting).  Wilfred Lacy, NP

## 2016-10-05 NOTE — Progress Notes (Signed)
Pre visit review using our clinic review tool, if applicable. No additional management support is needed unless otherwise documented below in the visit note. 

## 2016-10-19 ENCOUNTER — Encounter: Payer: Self-pay | Admitting: Registered"

## 2016-10-19 ENCOUNTER — Encounter: Payer: 59 | Attending: Nurse Practitioner | Admitting: Registered"

## 2016-10-19 DIAGNOSIS — E663 Overweight: Secondary | ICD-10-CM | POA: Insufficient documentation

## 2016-10-19 DIAGNOSIS — Z713 Dietary counseling and surveillance: Secondary | ICD-10-CM | POA: Insufficient documentation

## 2016-10-19 NOTE — Patient Instructions (Addendum)
Consider talking to your pharmacist about iron that won't cause constipation.  Consider water aerobics Meditation or yoga daily for stress 4 week program - [place] Work on getting a routine to get in more water Can start including yogurt for some of your breakfast Think about ways to unwind after work - walk around [employer]. Consider adding beans regularly to your diet.

## 2016-10-19 NOTE — Progress Notes (Signed)
Medical Nutrition Therapy:  Appt start time: 4742 end time:  1120.  Assessment:  Primary concerns today:  Pt states she has had some weight gain and she wants to lose weight to get off BP medication. Pt states after making lifestyle changes including reducing portion sizes, eating more fruits and veggies, and 2 days week no meat and Pt reports her doctor told her lab values are "steady" and she feels healthier.   Pt states that around 2014 she was diagnosed with hypothyroidism. Pt states the hysterectomy was d/t fibroids which were painful and caused weight gain. Pt reports she still has ovaries.   Pt stress includes recent move, living with daughter while waiting for contract on new house to close. Pt also states her mother is ill and she helps her out.For stress reduction pt reports she listens to music and takes melatonin to help with sleep and now getting 6 hrs which she says is enough.   Pt states she is still getting used to later work hours and eating dinner at a later time, 6-7 pm.   Menstruation history: Pt states until her first pregnancy she has always been a heavy bleeder, with severe cramps, fainting, vomiting.  Preferred Learning Style:   No preference indicated   Learning Readiness:   Ready  MEDICATIONS: reviewed.  Pt states she has been taking phentermine since Dec which seems to reduce morning appetite. Pt states she thinks her doctor will discontinuing medication at next visit.    DIETARY INTAKE:  Usual eating pattern includes 3 meals and 2-3 snacks per day.  Meats mostly chicken, some fish and occasionally liver (iron). Doesn't care for other meats.  24-hr recall:  B ( AM): smoothie protein OR oatmeal raisin OR special K OR waffle OR sausage patty OR left overs Snk ( AM): welches fruit snacks, mandarin oranges, occasionally cookie  L ( PM): olive garden 3 times a week OR nachoes a little cheese & salad OR lean cuisine meals  Snk ( PM): none or nuts D (6-7PM):  chicken or meatloaf, frozen veggies OR wings and corn, tea [employer] Snk ( PM): orange or cookie OR popcorn Beverages: water, tea  Usual physical activity:  Daily stairs -lives on 3rd floor, 3x week walking at lunch 1 mile loop.   Estimated energy needs: 1600 calories 180 g carbohydrates 120 g protein 44 g fat  Progress Towards Goal(s):  In progress.   Nutritional Diagnosis:  NB-1.1 Food and nutrition-related knowledge deficit As related to sources of plant based-carbohydrates.  As evidenced by pt stated learning new information on food groups.    Intervention:  Nutrition Education. Discussed factors affect body weight and metabolism including medications, stress, along with realistic expectations. Discussed food groups.  Plan:  Consider talking to your pharmacist about iron that won't cause constipation.  Consider water aerobics  Meditation or yoga daily for stress  4 week program - [place]  Work on getting a routine to get in more water  Can start including yogurt for some of your breakfast  Think about ways to unwind after work - walk around [employer].  Consider adding beans regularly to your diet.  Teaching Method Utilized:  Visual Auditory  Handouts given during visit include:  Plate Planner  Barriers to learning/adherence to lifestyle change: none  Demonstrated degree of understanding via:  Teach Back   Monitoring/Evaluation:  Dietary intake, exercise, and body weight in 1 month(s).

## 2016-10-22 MED FILL — PHENTERMINE 15 MG CAPSULE: 15 | 30 days supply | Qty: 30 | Fill #0

## 2016-10-22 MED FILL — LISINOPRIL-HCTZ 10-12.5 MG: 10-12.5 | 90 days supply | Qty: 90 | Fill #0

## 2016-10-22 MED FILL — METOPROLOL SUCC ER 50 MG TA: 50 | 90 days supply | Qty: 90 | Fill #0

## 2016-12-17 ENCOUNTER — Encounter (HOSPITAL_COMMUNITY): Payer: Self-pay | Admitting: *Deleted

## 2016-12-17 ENCOUNTER — Ambulatory Visit (HOSPITAL_COMMUNITY)
Admission: EM | Admit: 2016-12-17 | Discharge: 2016-12-17 | Disposition: A | Discharge status: Home / Self Care | Payer: 59 | Attending: Family Medicine | Admitting: Family Medicine

## 2016-12-17 DIAGNOSIS — J069 Acute upper respiratory infection, unspecified: Secondary | ICD-10-CM

## 2016-12-17 MED ORDER — IPRATROPIUM BROMIDE 0.03 % NA SOLN: 2.0000 | Freq: Two times a day (BID) | NASAL | 0 refills | Status: DC

## 2016-12-17 MED ORDER — PREDNISONE 10 MG PO TABS: 10.0000 mg | ORAL_TABLET | Freq: Two times a day (BID) | ORAL | 0 refills | Status: DC

## 2016-12-17 MED FILL — IPRATROPIUM 0.03% SPRAY: 0.03 | 43 days supply | Qty: 30 | Fill #0

## 2016-12-17 MED FILL — predniSONE 10 MG TABS: 10 | 5 days supply | Qty: 10 | Fill #0

## 2016-12-17 NOTE — ED Triage Notes (Signed)
Cough     Sinus   Drainage   Headache     With  Symptoms          X   3   Days       Pt is  Hoarse

## 2016-12-17 NOTE — ED Provider Notes (Signed)
[facility]    CSN: [id number] Arrival date & time: 12/17/16  1235     History   Chief Complaint Chief Complaint  Patient presents with  . Cough    HPI Shelly Cohen is a 56 y.o. female.   Is a 56 year old woman with 3 days of sinus congestion and hoarseness.  She had a fever on the second day but that has resolved. [facility] patient's past medical history is significant for hypertension. Over year ago, she had a case of pneumonia diagnosed.  She's had no shortness of breath.      Past Medical History:  Diagnosis Date  . Alpha thalassemia trait   . Fibroadenoma   . Glaucoma    monitored every 58months  . History of positive PPD, treatment status unknown   . Hypertension   . Thyroid disease     Patient Active Problem List   Diagnosis Date Noted  . Mass of breast, left 06/29/2016  . Thalassemia trait, alpha 06/29/2016  . Iron deficiency anemia 06/29/2016  . S/P bunionectomy 06/29/2016  . Great toe pain, right 06/29/2016  . Encounter for weight loss counseling 06/29/2016  . HTN (hypertension), benign 05/18/2016  . Hypothyroidism 05/18/2016    Past Surgical History:  Procedure Laterality Date  . ABDOMINAL HYSTERECTOMY  1989  . BREAST BIOPSY  2013  . BUNIONECTOMY  10/2014  . CHOLECYSTECTOMY  2017  . COLONOSCOPY    . GALLBLADDER SURGERY    . hallux limi    . LAPAROSCOPY    . TUBAL LIGATION      OB History    No data available       Home Medications    Prior to Admission medications   Medication Sig Start Date End Date Taking? Authorizing Provider  aspirin EC 81 MG tablet Take by mouth.    [provider]  ferrous sulfate 325 (65 FE) MG tablet Take by mouth.    [provider]  ipratropium (ATROVENT) 0.03 % nasal spray Place 2 sprays into both nostrils 2 (two) times daily. 12/17/16   Robyn Haber, MD  levothyroxine (SYNTHROID, LEVOTHROID) 50 MCG tablet Take 1 tablet (50 mcg total) by mouth daily before breakfast.  10/05/16   Nche, Charlene Brooke, NP  lisinopril-hydrochlorothiazide (PRINZIDE,ZESTORETIC) 10-12.5 MG tablet Take 1 tablet by mouth daily. 10/05/16   Nche, Charlene Brooke, NP  metoprolol succinate (TOPROL-XL) 50 MG 24 hr tablet Take 1 tablet (50 mg total) by mouth daily. Take with or immediately following a meal. 10/05/16   Nche, Charlene Brooke, NP  Multiple Vitamin (MULTI-VITAMINS) TABS Take by mouth.    [provider]  Omega-3 Fatty Acids (FISH OIL PO) Take by mouth.    [provider]  phentermine 15 MG capsule Take 1 capsule (15 mg total) by mouth every morning. 10/05/16   Nche, Charlene Brooke, NP  Polyethylene Glycol 3350 (MIRALAX PO) Take by mouth.    [provider]  potassium chloride (K-DUR) 10 MEQ tablet Take 1 tablet (10 mEq total) by mouth daily. 10/05/16   Nche, Charlene Brooke, NP  predniSONE (DELTASONE) 10 MG tablet Take 1 tablet (10 mg total) by mouth 2 (two) times daily with a meal. 12/17/16   Robyn Haber, MD    Family History Family History  Problem Relation Age of Onset  . Hypertension Mother   . Anemia Mother   . Arthritis Mother   . Kidney disease Mother   . Hypertension Sister   . Hypertension Brother   . Cancer  Maternal Grandmother   . Cancer Maternal Grandfather     Social History Social History  Substance Use Topics  . Smoking status: Never Smoker  . Smokeless tobacco: Never Used  . Alcohol use No     Allergies   Diphenhydramine hcl; Hydrocodone; Azithromycin; Cephalexin; Penicillin g; and Zolpidem   Review of Systems Review of Systems  Constitutional: Positive for fever.  HENT: Positive for congestion.   Respiratory: Positive for cough. Negative for shortness of breath.   All other systems reviewed and are negative.    Physical Exam Triage Vital Signs ED Triage Vitals  Enc Vitals Group     BP --      Pulse Rate 12/17/16 1301 85     Resp 12/17/16 1301 16     Temp 12/17/16 1301 97.7 F (36.5 C)     Temp Source 12/17/16 1301  Oral     SpO2 12/17/16 1301 100 %     Weight --      Height --      Head Circumference --      Peak Flow --      Pain Score 12/17/16 1302 5     Pain Loc --      Pain Edu? --      Excl. in [place]? --    No data found.   Updated Vital Signs Pulse 85   Temp 97.7 F (36.5 C) (Oral)   Resp 16   SpO2 100%    Physical Exam  Constitutional: She is oriented to person, place, and time. She appears well-developed and well-nourished.  HENT:  Right Ear: External ear normal.  Left Ear: External ear normal.  Nose: Nose normal.  Mouth/Throat: Oropharynx is clear and moist.  Eyes: Conjunctivae and EOM are normal. Pupils are equal, round, and reactive to light.  Neck: Normal range of motion. Neck supple.  Cardiovascular: Normal rate, regular rhythm and normal heart sounds.   Pulmonary/Chest: Effort normal and breath sounds normal.  Musculoskeletal: Normal range of motion.  Neurological: She is alert and oriented to person, place, and time.  Skin: Skin is warm and dry.  Nursing note and vitals reviewed.    UC Treatments / Results  Labs (all labs ordered are listed, but only abnormal results are displayed) Labs Reviewed - No data to display  EKG  EKG Interpretation None       Radiology No results found.  Procedures Procedures (including critical care time)  Medications Ordered in UC Medications - No data to display   Initial Impression / Assessment and Plan / UC Course  I have reviewed the triage vital signs and the nursing notes.  Pertinent labs & imaging results that were available during my care of the patient were reviewed by me and considered in my medical decision making (see chart for details).     Final Clinical Impressions(s) / UC Diagnoses   Final diagnoses:  Upper respiratory tract infection, unspecified type    New Prescriptions New Prescriptions   IPRATROPIUM (ATROVENT) 0.03 % NASAL SPRAY    Place 2 sprays into both nostrils 2 (two) times daily.    PREDNISONE (DELTASONE) 10 MG TABLET    Take 1 tablet (10 mg total) by mouth 2 (two) times daily with a meal.     Robyn Haber, MD 12/17/16 1321

## 2016-12-19 ENCOUNTER — Encounter: Payer: Self-pay | Admitting: Family Medicine

## 2016-12-19 ENCOUNTER — Encounter: Payer: Self-pay | Admitting: Emergency Medicine

## 2016-12-19 ENCOUNTER — Ambulatory Visit (INDEPENDENT_AMBULATORY_CARE_PROVIDER_SITE_OTHER): Payer: 59 | Admitting: Family Medicine

## 2016-12-19 VITALS — BP 154/82 | HR 79 | Temp 98.0°F | Wt 202.0 lb

## 2016-12-19 DIAGNOSIS — J22 Unspecified acute lower respiratory infection: Secondary | ICD-10-CM | POA: Diagnosis not present

## 2016-12-19 MED ORDER — HYDROCODONE-HOMATROPINE 5-1.5 MG/5ML PO SYRP
5.0000 mL | ORAL_SOLUTION | Freq: Three times a day (TID) | ORAL | 0 refills | Status: DC | PRN
Start: 2016-12-19 — End: 2017-01-04

## 2016-12-19 MED ORDER — ALBUTEROL SULFATE HFA 108 (90 BASE) MCG/ACT IN AERS: 2.0000 | INHALATION_SPRAY | RESPIRATORY_TRACT | 1 refills | Status: DC | PRN

## 2016-12-19 MED ORDER — AMOXICILLIN 875 MG PO TABS: 875.0000 mg | ORAL_TABLET | Freq: Two times a day (BID) | ORAL | 0 refills | Status: DC

## 2016-12-19 MED FILL — AMOXICILLIN 875 MG TABLET: 875 | 7 days supply | Qty: 14 | Fill #0

## 2016-12-19 MED FILL — VENTOLIN HFA 90 MCG INHALER: 108 (90 BAS | 16 days supply | Qty: 18 | Fill #0

## 2016-12-19 MED FILL — HYDROCODONE-HOMATROPINE SYR: 5-1.5 | 8 days supply | Qty: 120 | Fill #0

## 2016-12-19 NOTE — Progress Notes (Signed)
Subjective:    Patient ID: Shelly Cohen, female    DOB: March 22, 1961, 56 y.o.   MRN: [id number]  HPI This is a 56 yo female who presents today with 5 days of chills, hoarseness, cough with green sputum. She was seen at Urgent Care 6/18 and was given nasal spray and prednisone. Feels tight with coughing. Some SOB with lying down, coughing all night. Taking mucinex. Headache with cough. Right sided sore throat and ear pain. No nasal drainage or congestion. Had pneumonia 1.5 years ago.   Past Medical History:  Diagnosis Date  . Alpha thalassemia trait   . Fibroadenoma   . Glaucoma    monitored every 91months  . History of positive PPD, treatment status unknown   . Hypertension   . Thyroid disease    Past Surgical History:  Procedure Laterality Date  . ABDOMINAL HYSTERECTOMY  1989  . BREAST BIOPSY  2013  . BUNIONECTOMY  10/2014  . CHOLECYSTECTOMY  2017  . COLONOSCOPY    . GALLBLADDER SURGERY    . hallux limi    . LAPAROSCOPY    . TUBAL LIGATION     Family History  Problem Relation Age of Onset  . Hypertension Mother   . Anemia Mother   . Arthritis Mother   . Kidney disease Mother   . Hypertension Sister   . Hypertension Brother   . Cancer Maternal Grandmother   . Cancer Maternal Grandfather    Social History  Substance Use Topics  . Smoking status: Never Smoker  . Smokeless tobacco: Never Used  . Alcohol use No      Review of Systems Per HPI    Objective:   Physical Exam  Constitutional: She is oriented to person, place, and time. She appears well-developed and well-nourished. No distress.  Obese.  HENT:  Head: Normocephalic and atraumatic.  Right Ear: Tympanic membrane, external ear and ear canal normal.  Left Ear: Tympanic membrane, external ear and ear canal normal.  Nose: Mucosal edema and rhinorrhea present. Right sinus exhibits no maxillary sinus tenderness and no frontal sinus tenderness. Left sinus exhibits no maxillary sinus tenderness and no  frontal sinus tenderness.  Mouth/Throat: Uvula is midline and oropharynx is clear and moist.  Cardiovascular: Normal rate, regular rhythm and normal heart sounds.   Pulmonary/Chest: Effort normal and breath sounds normal. No respiratory distress. She has no wheezes. She has no rales.  Tight sounding, frequent cough.   Neurological: She is alert and oriented to person, place, and time.  Skin: Skin is warm and dry. She is not diaphoretic.  Vitals reviewed.     .BP (!) 160/86 (BP Location: Right Arm, Patient Position: Sitting, Cuff Size: Large)   Pulse 79   Temp 98 F (36.7 C) (Oral)   Wt 202 lb (91.6 kg)   SpO2 97%   BMI 36.95 kg/m  Wt Readings from Last 3 Encounters:  12/19/16 202 lb (91.6 kg)  10/19/16 202 lb 4.8 oz (91.8 kg)  10/05/16 203 lb (92.1 kg)   Recheck BP 154/82  BP Readings from Last 3 Encounters:  12/19/16 (!) 154/82  10/05/16 126/80  09/13/16 (!) 148/74       Assessment & Plan:  1. Lower respiratory infection - Provided written and verbal information regarding diagnosis and treatment. - HYDROcodone-homatropine (HYCODAN) 5-1.5 MG/5ML syrup; Take 5 mLs by mouth every 8 (eight) hours as needed for cough.  Dispense: 120 mL; Refill: 0 - albuterol (PROVENTIL HFA;VENTOLIN HFA) 108 (90 Base) MCG/ACT inhaler; Inhale 2  puffs into the lungs every 4 (four) hours as needed for wheezing or shortness of breath (cough, shortness of breath or wheezing.).  Dispense: 1 Inhaler; Refill: 1 - amoxicillin (AMOXIL) 875 MG tablet; Take 1 tablet (875 mg total) by mouth 2 (two) times daily.  Dispense: 14 tablet; Refill: 0   Clarene Reamer, FNP-BC  [facility]  12/22/2016 6:51 AM

## 2016-12-19 NOTE — Patient Instructions (Signed)
Continue Mucinex  For daytime cough suppression- delsym  Use inhaler every 4-6 hours for next 2-3 days then as needed  If not better in 3-5 days, please call office

## 2017-01-04 ENCOUNTER — Encounter: Payer: Self-pay | Admitting: Nurse Practitioner

## 2017-01-04 ENCOUNTER — Other Ambulatory Visit (INDEPENDENT_AMBULATORY_CARE_PROVIDER_SITE_OTHER): Payer: 59

## 2017-01-04 ENCOUNTER — Ambulatory Visit (INDEPENDENT_AMBULATORY_CARE_PROVIDER_SITE_OTHER): Payer: 59 | Admitting: Nurse Practitioner

## 2017-01-04 VITALS — BP 124/76 | HR 77 | Temp 97.7°F | Ht 62.0 in | Wt 201.0 lb

## 2017-01-04 DIAGNOSIS — Z136 Encounter for screening for cardiovascular disorders: Secondary | ICD-10-CM

## 2017-01-04 DIAGNOSIS — Z13220 Encounter for screening for lipoid disorders: Secondary | ICD-10-CM

## 2017-01-04 DIAGNOSIS — D509 Iron deficiency anemia, unspecified: Secondary | ICD-10-CM | POA: Diagnosis not present

## 2017-01-04 DIAGNOSIS — I10 Essential (primary) hypertension: Secondary | ICD-10-CM | POA: Diagnosis not present

## 2017-01-04 DIAGNOSIS — N951 Menopausal and female climacteric states: Secondary | ICD-10-CM

## 2017-01-04 DIAGNOSIS — E039 Hypothyroidism, unspecified: Secondary | ICD-10-CM

## 2017-01-04 DIAGNOSIS — Z0000 Encounter for general adult medical examination without abnormal findings: Secondary | ICD-10-CM

## 2017-01-04 LAB — LIPID PANEL
CHOL/HDL RATIO: 3
Cholesterol: 187 mg/dL (ref 0–200)
HDL: 55.9 mg/dL (ref >39.00)
LDL CALC: 113 mg/dL — AB (ref 0–99)
NonHDL: 131.25
TRIGLYCERIDES: 90 mg/dL (ref 0.0–149.0)
VLDL: 18 mg/dL (ref 0.0–40.0)

## 2017-01-04 LAB — CBC WITH DIFFERENTIAL/PLATELET
BASOS ABS: 0.1 10*3/uL (ref 0.0–0.1)
Basophils Relative: 1.2 % (ref 0.0–3.0)
Eosinophils Absolute: 0.2 10*3/uL (ref 0.0–0.7)
Eosinophils Relative: 2.6 % (ref 0.0–5.0)
HCT: 37.8 % (ref 36.0–46.0)
Hemoglobin: 12.4 g/dL (ref 12.0–15.0)
LYMPHS ABS: 2.2 10*3/uL (ref 0.7–4.0)
Lymphocytes Relative: 24.9 % (ref 12.0–46.0)
MCHC: 32.9 g/dL (ref 30.0–36.0)
MCV: 79.2 fl (ref 78.0–100.0)
MONO ABS: 0.5 10*3/uL (ref 0.1–1.0)
Monocytes Relative: 5.6 % (ref 3.0–12.0)
NEUTROS PCT: 65.7 % (ref 43.0–77.0)
Neutro Abs: 5.9 10*3/uL (ref 1.4–7.7)
Platelets: 281 10*3/uL (ref 150.0–400.0)
RBC: 4.77 Mil/uL (ref 3.87–5.11)
RDW: 16.1 % — ABNORMAL HIGH (ref 11.5–15.5)
WBC: 8.9 10*3/uL (ref 4.0–10.5)

## 2017-01-04 LAB — COMPREHENSIVE METABOLIC PANEL
ALK PHOS: 82 U/L (ref 39–117)
ALT: 11 U/L (ref 0–35)
AST: 12 U/L (ref 0–37)
Albumin: 4.1 g/dL (ref 3.5–5.2)
BILIRUBIN TOTAL: 0.5 mg/dL (ref 0.2–1.2)
BUN: 14 mg/dL (ref 6–23)
CO2: 32 mEq/L (ref 19–32)
CREATININE: 0.91 mg/dL (ref 0.40–1.20)
Calcium: 10.1 mg/dL (ref 8.4–10.5)
Chloride: 102 mEq/L (ref 96–112)
GFR: 82.37 mL/min (ref >60.00)
GLUCOSE: 107 mg/dL — AB (ref 70–99)
Potassium: 3.6 mEq/L (ref 3.5–5.1)
SODIUM: 141 meq/L (ref 135–145)
TOTAL PROTEIN: 8 g/dL (ref 6.0–8.3)

## 2017-01-04 LAB — TSH: TSH: 2.01 u[IU]/mL (ref 0.35–4.50)

## 2017-01-04 LAB — T4, FREE: Free T4: 0.85 ng/dL (ref 0.60–1.60)

## 2017-01-04 MED ORDER — POTASSIUM CHLORIDE ER 10 MEQ PO TBCR: 10.0000 meq | EXTENDED_RELEASE_TABLET | Freq: Every day | ORAL | 1 refills | Status: DC

## 2017-01-04 MED ORDER — CITALOPRAM HYDROBROMIDE 20 MG PO TABS: 20.0000 mg | ORAL_TABLET | Freq: Every day | ORAL | 3 refills | Status: DC

## 2017-01-04 MED ORDER — LISINOPRIL-HYDROCHLOROTHIAZIDE 10-12.5 MG PO TABS: 1.0000 | ORAL_TABLET | Freq: Every day | ORAL | 1 refills | Status: DC

## 2017-01-04 MED ORDER — LEVOTHYROXINE SODIUM 50 MCG PO TABS: 50.0000 ug | ORAL_TABLET | Freq: Every day | ORAL | 1 refills | Status: DC

## 2017-01-04 MED ORDER — METOPROLOL SUCCINATE ER 50 MG PO TB24: 50.0000 mg | ORAL_TABLET | Freq: Every day | ORAL | 1 refills | Status: DC

## 2017-01-04 NOTE — Progress Notes (Signed)
Subjective:    Patient ID: Shelly Cohen, female    DOB: 1960-08-08, 56 y.o.   MRN: [id number]  Patient presents today for complete physical  Other  This is a chronic (Hot flashes) problem. The current episode started more than 1 year ago. The problem occurs intermittently. The problem has been gradually worsening. Pertinent negatives include no chest pain, congestion, coughing, fatigue, fever, headaches, myalgias, nausea, numbness, rash, sore throat, swollen glands, urinary symptoms, vertigo, visual change, vomiting or weakness. Nothing aggravates the symptoms. She has tried nothing for the symptoms.   HTN: stable.  Immunizations: (TDAP, Hep C screen, Pneumovax, Influenza, zoster)  Health Maintenance  Topic Date Due  . Flu Shot  01/30/2017  . Mammogram  08/08/2018  . Colon Cancer Screening  09/20/2022  . Tetanus Vaccine  12/14/2025  .  Hepatitis C: One time screening is recommended by Center for Disease Control  (CDC) for  adults born from [birth year] through 1965.   Completed  . HIV Screening  Completed  . Pap Smear  Excluded   Diet:healthy.  Weight:  Wt Readings from Last 3 Encounters:  01/04/17 201 lb (91.2 kg)  12/19/16 202 lb (91.6 kg)  10/19/16 202 lb 4.8 oz (91.8 kg)    Exercise:walking daily.  Fall Risk: Fall Risk  10/19/2016 10/05/2016 05/18/2016  Falls in the past year? No No No   Home Safety:home with husband.  Depression/Suicide: Depression screen [facility] 2/9 10/19/2016 10/05/2016 05/18/2016  Decreased Interest 0 0 0  Down, Depressed, Hopeless 0 0 0  PHQ - 2 Score 0 0 0   Vision:up to date.  Dental:up to date.  Advanced Directive: Advanced Directives 10/19/2016  Does Patient Have a Medical Advance Directive? No  Would patient like information on creating a medical advance directive? Yes (MAU/Ambulatory/Procedural Areas - Information given)   Sexual History (birth control, marital status, STD):marries, sexually active with husband.  Medications and allergies  reviewed with patient and updated if appropriate.  Patient Active Problem List   Diagnosis Date Noted  . Mass of breast, left 06/29/2016  . Thalassemia trait, alpha 06/29/2016  . Iron deficiency anemia 06/29/2016  . S/P bunionectomy 06/29/2016  . Great toe pain, right 06/29/2016  . Encounter for weight loss counseling 06/29/2016  . HTN (hypertension), benign 05/18/2016  . Hypothyroidism 05/18/2016    Current Outpatient Prescriptions on File Prior to Visit  Medication Sig Dispense Refill  . albuterol (PROVENTIL HFA;VENTOLIN HFA) 108 (90 Base) MCG/ACT inhaler Inhale 2 puffs into the lungs every 4 (four) hours as needed for wheezing or shortness of breath (cough, shortness of breath or wheezing.). 1 Inhaler 1  . aspirin EC 81 MG tablet Take by mouth.    . ferrous sulfate 325 (65 FE) MG tablet Take by mouth.    . Multiple Vitamin (MULTI-VITAMINS) TABS Take by mouth.    . Omega-3 Fatty Acids (FISH OIL PO) Take by mouth.    . Polyethylene Glycol 3350 (MIRALAX PO) Take by mouth.     No current facility-administered medications on file prior to visit.     Past Medical History:  Diagnosis Date  . Alpha thalassemia trait   . Fibroadenoma   . Glaucoma    monitored every 55months  . History of positive PPD, treatment status unknown   . Hypertension   . Thyroid disease     Past Surgical History:  Procedure Laterality Date  . ABDOMINAL HYSTERECTOMY  1989  . BREAST BIOPSY  2013  . BUNIONECTOMY  10/2014  . CHOLECYSTECTOMY  2017  . COLONOSCOPY    . GALLBLADDER SURGERY    . hallux limi    . LAPAROSCOPY    . TUBAL LIGATION      Social History   Social History  . Marital status: Married    Spouse name: N/A  . Number of children: N/A  . Years of education: N/A   Social History Main Topics  . Smoking status: Never Smoker  . Smokeless tobacco: Never Used  . Alcohol use No  . Drug use: No  . Sexual activity: Yes    Birth control/ protection: Surgical   Other Topics Concern    . None   Social History Narrative  . None    Family History  Problem Relation Age of Onset  . Hypertension Mother   . Anemia Mother   . Arthritis Mother   . Kidney disease Mother   . Hypertension Sister   . Hypertension Brother   . Cancer Maternal Grandmother   . Cancer Maternal Grandfather         Review of Systems  Constitutional: Negative for fatigue, fever, malaise/fatigue and weight loss.       Worsening hot flashes  HENT: Negative for congestion and sore throat.   Eyes:       Negative for visual changes  Respiratory: Negative for cough and shortness of breath.   Cardiovascular: Negative for chest pain, palpitations and leg swelling.  Gastrointestinal: Negative for blood in stool, constipation, diarrhea, heartburn, nausea and vomiting.  Genitourinary: Negative for dysuria, frequency and urgency.  Musculoskeletal: Negative for falls, joint pain and myalgias.  Skin: Negative for rash.  Neurological: Negative for dizziness, vertigo, sensory change, weakness, numbness and headaches.  Endo/Heme/Allergies: Does not bruise/bleed easily.  Psychiatric/Behavioral: Negative for depression, substance abuse and suicidal ideas. The patient is not nervous/anxious.     Objective:   Vitals:   01/04/17 0821  BP: 124/76  Pulse: 77  Temp: 97.7 F (36.5 C)    Body mass index is 36.76 kg/m.   Physical Examination:  Physical Exam  Constitutional: She is oriented to person, place, and time and well-developed, well-nourished, and in no distress. No distress.  HENT:  Right Ear: External ear normal.  Left Ear: External ear normal.  Nose: Nose normal.  Mouth/Throat: No oropharyngeal exudate.  Eyes: Conjunctivae and EOM are normal. Pupils are equal, round, and reactive to light. No scleral icterus.  Neck: Normal range of motion. Neck supple. No thyromegaly present.  Cardiovascular: Normal rate, regular rhythm, normal heart sounds and intact distal pulses.   Pulmonary/Chest:  Effort normal and breath sounds normal. Right breast exhibits no mass, no nipple discharge and no skin change. Left breast exhibits no mass, no nipple discharge and no skin change. Breasts are symmetrical.  Abdominal: Soft. Bowel sounds are normal. She exhibits no distension. There is no tenderness.  Genitourinary: Left adnexa normal.  Musculoskeletal: Normal range of motion. She exhibits no edema or tenderness.  Lymphadenopathy:    She has no cervical adenopathy.  Neurological: She is alert and oriented to person, place, and time. Gait normal.  Skin: Skin is warm and dry.  Psychiatric: Affect and judgment normal.  Vitals reviewed.   ASSESSMENT and PLAN:  Hellon was seen today for annual exam.  Diagnoses and all orders for this visit:  Preventative health care -     CBC w/Diff; Future -     Comprehensive metabolic panel; Future -     TSH; Future -     T4, free;  Future -     Lipid panel; Future  Hypothyroidism, unspecified type -     levothyroxine (SYNTHROID, LEVOTHROID) 50 MCG tablet; Take 1 tablet (50 mcg total) by mouth daily before breakfast.  HTN (hypertension), benign  Iron deficiency anemia, unspecified iron deficiency anemia type  Hot flashes due to menopause -     citalopram (CELEXA) 20 MG tablet; Take 1 tablet (20 mg total) by mouth daily.  Encounter for lipid screening for cardiovascular disease -     Lipid panel; Future  Essential hypertension, malignant -     lisinopril-hydrochlorothiazide (PRINZIDE,ZESTORETIC) 10-12.5 MG tablet; Take 1 tablet by mouth daily. -     metoprolol succinate (TOPROL-XL) 50 MG 24 hr tablet; Take 1 tablet (50 mg total) by mouth daily. Take with or immediately following a meal. -     potassium chloride (K-DUR) 10 MEQ tablet; Take 1 tablet (10 mEq total) by mouth daily.   No problem-specific Assessment & Plan notes found for this encounter.     Follow up: Return in about 6 months (around 07/07/2017) for HTN and  hypothyroidism.  Wilfred Lacy, NP

## 2017-01-04 NOTE — Patient Instructions (Signed)
I instructed pt to start 1/2 tablet once daily for 1 week and then increase to a full tablet once daily on week two as tolerated.   We discussed common side effects such as nausea, drowsiness and weight gain.  Also discussed rare but serious side effect of suicide ideation.  She is instructed to discontinue medication go directly to ED if this occurs.  Pt verbalizes understanding.   Plan follow up in 1 month to evaluate progress.    Menopause Menopause is the normal time of life when menstrual periods stop completely. Menopause is complete when you have missed 12 consecutive menstrual periods. It usually occurs between the ages of 62 years and 6 years. Very rarely does a woman develop menopause before the age of 47 years. At menopause, your ovaries stop producing the female hormones estrogen and progesterone. This can cause undesirable symptoms and also affect your health. Sometimes the symptoms may occur 4-5 years before the menopause begins. There is no relationship between menopause and:  Oral contraceptives.  Number of children you had.  Race.  The age your menstrual periods started (menarche).  Heavy smokers and very thin women may develop menopause earlier in life. What are the causes?  The ovaries stop producing the female hormones estrogen and progesterone. Other causes include:  Surgery to remove both ovaries.  The ovaries stop functioning for no known reason.  Tumors of the pituitary gland in the brain.  Medical disease that affects the ovaries and hormone production.  Radiation treatment to the abdomen or pelvis.  Chemotherapy that affects the ovaries.  What are the signs or symptoms?  Hot flashes.  Night sweats.  Decrease in sex drive.  Vaginal dryness and thinning of the vagina causing painful intercourse.  Dryness of the skin and developing wrinkles.  Headaches.  Tiredness.  Irritability.  Memory problems.  Weight gain.  Bladder  infections.  Hair growth of the face and chest.  Infertility. More serious symptoms include:  Loss of bone (osteoporosis) causing breaks (fractures).  Depression.  Hardening and narrowing of the arteries (atherosclerosis) causing heart attacks and strokes.  How is this diagnosed?  When the menstrual periods have stopped for 12 straight months.  Physical exam.  Hormone studies of the blood. How is this treated? There are many treatment choices and nearly as many questions about them. The decisions to treat or not to treat menopausal changes is an individual choice made with your health care provider. Your health care provider can discuss the treatments with you. Together, you can decide which treatment will work best for you. Your treatment choices may include:  Hormone therapy (estrogen and progesterone).  Non-hormonal medicines.  Treating the individual symptoms with medicine (for example antidepressants for depression).  Herbal medicines that may help specific symptoms.  Counseling by a psychiatrist or psychologist.  Group therapy.  Lifestyle changes including: ? Eating healthy. ? Regular exercise. ? Limiting caffeine and alcohol. ? Stress management and meditation.  No treatment.  Follow these instructions at home:  Take the medicine your health care provider gives you as directed.  Get plenty of sleep and rest.  Exercise regularly.  Eat a diet that contains calcium (good for the bones) and soy products (acts like estrogen hormone).  Avoid alcoholic beverages.  Do not smoke.  If you have hot flashes, dress in layers.  Take supplements, calcium, and vitamin D to strengthen bones.  You can use over-the-counter lubricants or moisturizers for vaginal dryness.  Group therapy is sometimes very helpful.  Acupuncture may be helpful in some cases. Contact a health care provider if:  You are not sure you are in menopause.  You are having menopausal  symptoms and need advice and treatment.  You are still having menstrual periods after age 12 years.  You have pain with intercourse.  Menopause is complete (no menstrual period for 12 months) and you develop vaginal bleeding.  You need a referral to a specialist (gynecologist, psychiatrist, or psychologist) for treatment. Get help right away if:  You have severe depression.  You have excessive vaginal bleeding.  You fell and think you have a broken bone.  You have pain when you urinate.  You develop leg or chest pain.  You have a fast pounding heart beat (palpitations).  You have severe headaches.  You develop vision problems.  You feel a lump in your breast.  You have abdominal pain or severe indigestion. This information is not intended to replace advice given to you by your health care provider. Make sure you discuss any questions you have with your health care provider. Document Released: 09/08/2003 Document Revised: 11/24/2015 Document Reviewed: 01/15/2013 Elsevier Interactive Patient Education  2017 [employer] Maintenance, Female Adopting a healthy lifestyle and getting preventive care can go a long way to promote health and wellness. Talk with your health care provider about what schedule of regular examinations is right for you. This is a good chance for you to check in with your provider about disease prevention and staying healthy. In between checkups, there are plenty of things you can do on your own. Experts have done a lot of research about which lifestyle changes and preventive measures are most likely to keep you healthy. Ask your health care provider for more information. Weight and diet Eat a healthy diet  Be sure to include plenty of vegetables, fruits, low-fat dairy products, and lean protein.  Do not eat a lot of foods high in solid fats, added sugars, or salt.  Get regular exercise. This is one of the most important things you can do for  your health. ? Most adults should exercise for at least 150 minutes each week. The exercise should increase your heart rate and make you sweat (moderate-intensity exercise). ? Most adults should also do strengthening exercises at least twice a week. This is in addition to the moderate-intensity exercise.  Maintain a healthy weight  Body mass index (BMI) is a measurement that can be used to identify possible weight problems. It estimates body fat based on height and weight. Your health care provider can help determine your BMI and help you achieve or maintain a healthy weight.  For females 102 years of age and older: ? A BMI below 18.5 is considered underweight. ? A BMI of 18.5 to 24.9 is normal. ? A BMI of 25 to 29.9 is considered overweight. ? A BMI of 30 and above is considered obese.  Watch levels of cholesterol and blood lipids  You should start having your blood tested for lipids and cholesterol at 56 years of age, then have this test every 5 years.  You may need to have your cholesterol levels checked more often if: ? Your lipid or cholesterol levels are high. ? You are older than 56 years of age. ? You are at high risk for heart disease.  Cancer screening Lung Cancer  Lung cancer screening is recommended for adults 77-53 years old who are at high risk for lung cancer because of a history of smoking.  A yearly low-dose CT scan of the lungs is recommended for people who: ? Currently smoke. ? Have quit within the past 15 years. ? Have at least a 30-pack-year history of smoking. A pack year is smoking an average of one pack of cigarettes a day for 1 year.  Yearly screening should continue until it has been 15 years since you quit.  Yearly screening should stop if you develop a health problem that would prevent you from having lung cancer treatment.  Breast Cancer  Practice breast self-awareness. This means understanding how your breasts normally appear and feel.  It also  means doing regular breast self-exams. Let your health care provider know about any changes, no matter how small.  If you are in your 20s or 30s, you should have a clinical breast exam (CBE) by a health care provider every 1-3 years as part of a regular health exam.  If you are 48 or older, have a CBE every year. Also consider having a breast X-ray (mammogram) every year.  If you have a family history of breast cancer, talk to your health care provider about genetic screening.  If you are at high risk for breast cancer, talk to your health care provider about having an MRI and a mammogram every year.  Breast cancer gene (BRCA) assessment is recommended for women who have family members with BRCA-related cancers. BRCA-related cancers include: ? Breast. ? Ovarian. ? Tubal. ? Peritoneal cancers.  Results of the assessment will determine the need for genetic counseling and BRCA1 and BRCA2 testing.  Cervical Cancer Your health care provider may recommend that you be screened regularly for cancer of the pelvic organs (ovaries, uterus, and vagina). This screening involves a pelvic examination, including checking for microscopic changes to the surface of your cervix (Pap test). You may be encouraged to have this screening done every 3 years, beginning at age 47.  For women ages 22-65, health care providers may recommend pelvic exams and Pap testing every 3 years, or they may recommend the Pap and pelvic exam, combined with testing for human papilloma virus (HPV), every 5 years. Some types of HPV increase your risk of cervical cancer. Testing for HPV may also be done on women of any age with unclear Pap test results.  Other health care providers may not recommend any screening for nonpregnant women who are considered low risk for pelvic cancer and who do not have symptoms. Ask your health care provider if a screening pelvic exam is right for you.  If you have had past treatment for cervical cancer or  a condition that could lead to cancer, you need Pap tests and screening for cancer for at least 20 years after your treatment. If Pap tests have been discontinued, your risk factors (such as having a new sexual partner) need to be reassessed to determine if screening should resume. Some women have medical problems that increase the chance of getting cervical cancer. In these cases, your health care provider may recommend more frequent screening and Pap tests.  Colorectal Cancer  This type of cancer can be detected and often prevented.  Routine colorectal cancer screening usually begins at 56 years of age and continues through 56 years of age.  Your health care provider may recommend screening at an earlier age if you have risk factors for colon cancer.  Your health care provider may also recommend using home test kits to check for hidden blood in the stool.  A small camera at the end  of a tube can be used to examine your colon directly (sigmoidoscopy or colonoscopy). This is done to check for the earliest forms of colorectal cancer.  Routine screening usually begins at age 68.  Direct examination of the colon should be repeated every 5-10 years through 56 years of age. However, you may need to be screened more often if early forms of precancerous polyps or small growths are found.  Skin Cancer  Check your skin from head to toe regularly.  Tell your health care provider about any new moles or changes in moles, especially if there is a change in a mole's shape or color.  Also tell your health care provider if you have a mole that is larger than the size of a pencil eraser.  Always use sunscreen. Apply sunscreen liberally and repeatedly throughout the day.  Protect yourself by wearing long sleeves, pants, a wide-brimmed hat, and sunglasses whenever you are outside.  Heart disease, diabetes, and high blood pressure  High blood pressure causes heart disease and increases the risk of  stroke. High blood pressure is more likely to develop in: ? People who have blood pressure in the high end of the normal range (130-139/85-89 mm Hg). ? People who are overweight or obese. ? People who are African American.  If you are 58-69 years of age, have your blood pressure checked every 3-5 years. If you are 57 years of age or older, have your blood pressure checked every year. You should have your blood pressure measured twice-once when you are at a hospital or clinic, and once when you are not at a hospital or clinic. Record the average of the two measurements. To check your blood pressure when you are not at a hospital or clinic, you can use: ? An automated blood pressure machine at a pharmacy. ? A home blood pressure monitor.  If you are between 41 years and 12 years old, ask your health care provider if you should take aspirin to prevent strokes.  Have regular diabetes screenings. This involves taking a blood sample to check your fasting blood sugar level. ? If you are at a normal weight and have a low risk for diabetes, have this test once every three years after 56 years of age. ? If you are overweight and have a high risk for diabetes, consider being tested at a younger age or more often. Preventing infection Hepatitis B  If you have a higher risk for hepatitis B, you should be screened for this virus. You are considered at high risk for hepatitis B if: ? You were born in a country where hepatitis B is common. Ask your health care provider which countries are considered high risk. ? Your parents were born in a high-risk country, and you have not been immunized against hepatitis B (hepatitis B vaccine). ? You have HIV or AIDS. ? You use needles to inject street drugs. ? You live with someone who has hepatitis B. ? You have had sex with someone who has hepatitis B. ? You get hemodialysis treatment. ? You take certain medicines for conditions, including cancer, organ  transplantation, and autoimmune conditions.  Hepatitis C  Blood testing is recommended for: ? Everyone born from [birth year] through 1965. ? Anyone with known risk factors for hepatitis C.  Sexually transmitted infections (STIs)  You should be screened for sexually transmitted infections (STIs) including gonorrhea and chlamydia if: ? You are sexually active and are younger than 56 years of age. ? You are  older than 56 years of age and your health care provider tells you that you are at risk for this type of infection. ? Your sexual activity has changed since you were last screened and you are at an increased risk for chlamydia or gonorrhea. Ask your health care provider if you are at risk.  If you do not have HIV, but are at risk, it may be recommended that you take a prescription medicine daily to prevent HIV infection. This is called pre-exposure prophylaxis (PrEP). You are considered at risk if: ? You are sexually active and do not regularly use condoms or know the HIV status of your partner(s). ? You take drugs by injection. ? You are sexually active with a partner who has HIV.  Talk with your health care provider about whether you are at high risk of being infected with HIV. If you choose to begin PrEP, you should first be tested for HIV. You should then be tested every 3 months for as long as you are taking PrEP. Pregnancy  If you are premenopausal and you may become pregnant, ask your health care provider about preconception counseling.  If you may become pregnant, take 400 to 800 micrograms (mcg) of folic acid every day.  If you want to prevent pregnancy, talk to your health care provider about birth control (contraception). Osteoporosis and menopause  Osteoporosis is a disease in which the bones lose minerals and strength with aging. This can result in serious bone fractures. Your risk for osteoporosis can be identified using a bone density scan.  If you are 47 years of age or  older, or if you are at risk for osteoporosis and fractures, ask your health care provider if you should be screened.  Ask your health care provider whether you should take a calcium or vitamin D supplement to lower your risk for osteoporosis.  Menopause may have certain physical symptoms and risks.  Hormone replacement therapy may reduce some of these symptoms and risks. Talk to your health care provider about whether hormone replacement therapy is right for you. Follow these instructions at home:  Schedule regular health, dental, and eye exams.  Stay current with your immunizations.  Do not use any tobacco products including cigarettes, chewing tobacco, or electronic cigarettes.  If you are pregnant, do not drink alcohol.  If you are breastfeeding, limit how much and how often you drink alcohol.  Limit alcohol intake to no more than 1 drink per day for nonpregnant women. One drink equals 12 ounces of beer, 5 ounces of wine, or 1 ounces of hard liquor.  Do not use street drugs.  Do not share needles.  Ask your health care provider for help if you need support or information about quitting drugs.  Tell your health care provider if you often feel depressed.  Tell your health care provider if you have ever been abused or do not feel safe at home. This information is not intended to replace advice given to you by your health care provider. Make sure you discuss any questions you have with your health care provider. Document Released: 01/01/2011 Document Revised: 11/24/2015 Document Reviewed: 03/22/2015 Elsevier Interactive Patient Education  [employer].

## 2017-01-29 MED FILL — LEVOTHYROXINE 50 MCG TABLET: 50 | 90 days supply | Qty: 90 | Fill #0

## 2017-01-29 MED FILL — METOPROLOL SUCC ER 50 MG TA: 50 | 90 days supply | Qty: 90 | Fill #0

## 2017-01-29 MED FILL — POTASSIUM CL 10 MEQ TAB SA: 10 | 90 days supply | Qty: 90 | Fill #0

## 2017-01-29 MED FILL — LISINOPRIL-HCTZ 10-12.5 MG: 10-12.5 | 90 days supply | Qty: 90 | Fill #0

## 2017-05-16 ENCOUNTER — Encounter: Payer: Self-pay | Admitting: Nurse Practitioner

## 2017-05-16 MED FILL — LEVOTHYROXINE 50 MCG TABLET: 50 | 90 days supply | Qty: 90 | Fill #1

## 2017-06-05 ENCOUNTER — Encounter (HOSPITAL_COMMUNITY): Payer: Self-pay | Admitting: *Deleted

## 2017-06-05 ENCOUNTER — Telehealth (HOSPITAL_COMMUNITY): Payer: Self-pay | Admitting: Emergency Medicine

## 2017-06-05 ENCOUNTER — Ambulatory Visit (HOSPITAL_COMMUNITY)
Admission: EM | Admit: 2017-06-05 | Discharge: 2017-06-05 | Disposition: A | Discharge status: Home / Self Care | Payer: 59 | Attending: Family Medicine | Admitting: Family Medicine

## 2017-06-05 ENCOUNTER — Other Ambulatory Visit: Payer: Self-pay

## 2017-06-05 DIAGNOSIS — J069 Acute upper respiratory infection, unspecified: Secondary | ICD-10-CM | POA: Diagnosis not present

## 2017-06-05 DIAGNOSIS — B9789 Other viral agents as the cause of diseases classified elsewhere: Secondary | ICD-10-CM | POA: Diagnosis not present

## 2017-06-05 MED ORDER — BENZONATATE 100 MG PO CAPS: 100.0000 mg | ORAL_CAPSULE | Freq: Three times a day (TID) | ORAL | 0 refills | Status: DC

## 2017-06-05 MED ORDER — PREDNISONE 20 MG PO TABS: 40.0000 mg | ORAL_TABLET | Freq: Every day | ORAL | 0 refills | Status: AC

## 2017-06-05 MED ORDER — PREDNISONE 20 MG PO TABS: 40.0000 mg | ORAL_TABLET | Freq: Every day | ORAL | 0 refills | Status: DC

## 2017-06-05 NOTE — ED Notes (Signed)
Called in medication to [email or web address]

## 2017-06-05 NOTE — ED Provider Notes (Addendum)
[place]    CSN: [id number] Arrival date & time: 06/05/17  1715     History   Chief Complaint Chief Complaint  Patient presents with  . Cough    HPI Shelly Cohen is a 56 y.o. female.   Shelly Cohen presents with complaints of persistent cough. She started feeling ill originally approximately 3-4 days ago with fevers and sore throat. Cough has since persisted. Mild runny nose. Currently denies fevers, sore throat, rash, gi/gu complaints, shortness of breath or chest pain. She has an inhaler which she has used at home which has been helpful. Without history of asthma. She has had ill coworkers. Has taken mucinex which has helped with her symptoms. Cough is worse at night.  bp noted to be elevated, patient states she takes her metoprolol at night and she has not yet taken it. Denies current headache, chest pain, dizziness  ROS per HPI.       Past Medical History:  Diagnosis Date  . Alpha thalassemia trait   . Fibroadenoma   . Glaucoma    monitored every 59months  . History of positive PPD, treatment status unknown   . Hypertension   . Thyroid disease     Patient Active Problem List   Diagnosis Date Noted  . Mass of breast, left 06/29/2016  . Thalassemia trait, alpha 06/29/2016  . Iron deficiency anemia 06/29/2016  . S/P bunionectomy 06/29/2016  . Great toe pain, right 06/29/2016  . Encounter for weight loss counseling 06/29/2016  . HTN (hypertension), benign 05/18/2016  . Hypothyroidism 05/18/2016    Past Surgical History:  Procedure Laterality Date  . ABDOMINAL HYSTERECTOMY  1989  . BREAST BIOPSY  2013  . BUNIONECTOMY  10/2014  . CHOLECYSTECTOMY  2017  . COLONOSCOPY    . GALLBLADDER SURGERY    . hallux limi    . LAPAROSCOPY    . TUBAL LIGATION      OB History    No data available       Home Medications    Prior to Admission medications   Medication Sig Start Date End Date Taking? Authorizing Provider  albuterol (PROVENTIL  HFA;VENTOLIN HFA) 108 (90 Base) MCG/ACT inhaler Inhale 2 puffs into the lungs every 4 (four) hours as needed for wheezing or shortness of breath (cough, shortness of breath or wheezing.). 12/19/16  Yes Elby Beck, FNP  aspirin EC 81 MG tablet Take by mouth.   Yes [provider]  citalopram (CELEXA) 20 MG tablet Take 1 tablet (20 mg total) by mouth daily. 01/04/17  Yes Nche, Charlene Brooke, NP  levothyroxine (SYNTHROID, LEVOTHROID) 50 MCG tablet Take 1 tablet (50 mcg total) by mouth daily before breakfast. 01/04/17  Yes Nche, Charlene Brooke, NP  lisinopril-hydrochlorothiazide (PRINZIDE,ZESTORETIC) 10-12.5 MG tablet Take 1 tablet by mouth daily. 01/04/17  Yes Nche, Charlene Brooke, NP  metoprolol succinate (TOPROL-XL) 50 MG 24 hr tablet Take 1 tablet (50 mg total) by mouth daily. Take with or immediately following a meal. 01/04/17  Yes Nche, Charlene Brooke, NP  Multiple Vitamin (MULTI-VITAMINS) TABS Take by mouth.   Yes [provider]  Omega-3 Fatty Acids (FISH OIL PO) Take by mouth.   Yes [provider]  Polyethylene Glycol 3350 (MIRALAX PO) Take by mouth.   Yes [provider]  potassium chloride (K-DUR) 10 MEQ tablet Take 1 tablet (10 mEq total) by mouth daily. 01/04/17  Yes Nche, Charlene Brooke, NP  benzonatate (TESSALON) 100 MG capsule Take 1 capsule (100 mg total) by  mouth every 8 (eight) hours. 06/05/17   Augusto Gamble B, NP  ferrous sulfate 325 (65 FE) MG tablet Take by mouth.    [provider]  predniSONE (DELTASONE) 20 MG tablet Take 2 tablets (40 mg total) by mouth daily with breakfast for 5 days. 06/05/17 06/10/17  Zigmund Gottron, NP    Family History Family History  Problem Relation Age of Onset  . Hypertension Mother   . Anemia Mother   . Arthritis Mother   . Kidney disease Mother   . Hypertension Sister   . Hypertension Brother   . Cancer Maternal Grandmother   . Cancer Maternal Grandfather     Social History Social History    Tobacco Use  . Smoking status: Never Smoker  . Smokeless tobacco: Never Used  Substance Use Topics  . Alcohol use: No  . Drug use: No     Allergies   Diphenhydramine hcl; Hydrocodone; Azithromycin; Cephalexin; Penicillin g; and Zolpidem   Review of Systems Review of Systems   Physical Exam Triage Vital Signs ED Triage Vitals  Enc Vitals Group     BP 06/05/17 1809 (!) 207/83     Pulse Rate 06/05/17 1809 86     Resp --      Temp 06/05/17 1809 98.3 F (36.8 C)     Temp Source 06/05/17 1809 Oral     SpO2 06/05/17 1809 100 %     Weight --      Height --      Head Circumference --      Peak Flow --      Pain Score 06/05/17 1805 8     Pain Loc --      Pain Edu? --      Excl. in [place]? --    No data found.  Updated Vital Signs BP (!) 207/83 (BP Location: Left Arm)   Pulse 86   Temp 98.3 F (36.8 C) (Oral)   SpO2 100%   Visual Acuity Right Eye Distance:   Left Eye Distance:   Bilateral Distance:    Right Eye Near:   Left Eye Near:    Bilateral Near:     Physical Exam  Constitutional: She is oriented to person, place, and time. She appears well-developed and well-nourished. No distress.  HENT:  Head: Normocephalic and atraumatic.  Right Ear: Tympanic membrane, external ear and ear canal normal.  Left Ear: Tympanic membrane, external ear and ear canal normal.  Nose: Nose normal.  Mouth/Throat: Uvula is midline, oropharynx is clear and moist and mucous membranes are normal. No tonsillar exudate.  Eyes: Conjunctivae and EOM are normal. Pupils are equal, round, and reactive to light.  Cardiovascular: Normal rate, regular rhythm and normal heart sounds.  Pulmonary/Chest: Effort normal and breath sounds normal. No respiratory distress. She has no wheezes.  Strong frequent dry cough noted  Lymphadenopathy:    She has cervical adenopathy.  Neurological: She is alert and oriented to person, place, and time.  Skin: Skin is warm and dry.     UC Treatments /  Results  Labs (all labs ordered are listed, but only abnormal results are displayed) Labs Reviewed - No data to display  EKG  EKG Interpretation None       Radiology No results found.  Procedures Procedures (including critical care time)  Medications Ordered in UC Medications - No data to display   Initial Impression / Assessment and Plan / UC Course  I have reviewed the triage vital signs  and the nursing notes.  Pertinent labs & imaging results that were available during my care of the patient were reviewed by me and considered in my medical decision making (see chart for details).     Non toxic in appearance, without distress, benign physical findings. Consistent with viral illness. Supportive cares recommended. Push fluids to ensure adequate hydration and keep secretions thin. mucinex as needed. Inhaler as needed. 5 days of prednisone to help with cough as well as tessalon as needed. Take metoprolol once home. Return precautions provided. Patient verbalized understanding and agreeable to plan.  If symptoms worsen or do not improve in the next week to return to be seen or to follow up with PCP.    Final Clinical Impressions(s) / UC Diagnoses   Final diagnoses:  Viral URI with cough    ED Discharge Orders        Ordered    predniSONE (DELTASONE) 20 MG tablet  Daily with breakfast     06/05/17 1848    benzonatate (TESSALON) 100 MG capsule  Every 8 hours     06/05/17 1848       Controlled Substance Prescriptions [place] Controlled Substance Registry consulted? Not Applicable   Zigmund Gottron, NP 06/05/17 Randol Kern    Zigmund Gottron, NP 06/05/17 [phone]

## 2017-06-05 NOTE — ED Triage Notes (Signed)
Per pt started Saturday, sore throat, nasal drainage, green mucus, cough, per pt she's been using her inhaler.

## 2017-06-05 NOTE — ED Notes (Signed)
Informed Shelly Cohen and Shelly Cohen with pt BP

## 2017-06-05 NOTE — Discharge Instructions (Signed)
Take your blood pressure medication once you get home tonight.  Push fluids to ensure adequate hydration and keep secretions thin.  May try tessalon capsule every 8 hours as needed for cough May continue with mucinex as an expectorant to help loosen secretions. 5 days of prednisone to help with cough. Inhaler as needed. If develop headache with dizziness, chest pain, shortness of breath or difficulty breathing return to be seen or visit ed.

## 2017-06-13 ENCOUNTER — Emergency Department (HOSPITAL_COMMUNITY)
Admission: EM | Admit: 2017-06-13 | Discharge: 2017-06-13 | Disposition: A | Discharge status: Home / Self Care | Payer: 59 | Attending: Emergency Medicine | Admitting: Emergency Medicine

## 2017-06-13 ENCOUNTER — Emergency Department (HOSPITAL_COMMUNITY): Payer: 59

## 2017-06-13 ENCOUNTER — Encounter (HOSPITAL_COMMUNITY): Payer: Self-pay | Admitting: Emergency Medicine

## 2017-06-13 DIAGNOSIS — R0602 Shortness of breath: Secondary | ICD-10-CM | POA: Diagnosis not present

## 2017-06-13 DIAGNOSIS — Z7982 Long term (current) use of aspirin: Secondary | ICD-10-CM | POA: Insufficient documentation

## 2017-06-13 DIAGNOSIS — Z79899 Other long term (current) drug therapy: Secondary | ICD-10-CM | POA: Diagnosis not present

## 2017-06-13 DIAGNOSIS — I10 Essential (primary) hypertension: Secondary | ICD-10-CM | POA: Diagnosis not present

## 2017-06-13 DIAGNOSIS — E039 Hypothyroidism, unspecified: Secondary | ICD-10-CM | POA: Diagnosis not present

## 2017-06-13 DIAGNOSIS — R0789 Other chest pain: Secondary | ICD-10-CM | POA: Diagnosis not present

## 2017-06-13 DIAGNOSIS — R079 Chest pain, unspecified: Secondary | ICD-10-CM | POA: Diagnosis not present

## 2017-06-13 LAB — CBC
HCT: 37.3 % (ref 36.0–46.0)
Hemoglobin: 11.9 g/dL — ABNORMAL LOW (ref 12.0–15.0)
MCH: 25.6 pg — ABNORMAL LOW (ref 26.0–34.0)
MCHC: 31.9 g/dL (ref 30.0–36.0)
MCV: 80.2 fL (ref 78.0–100.0)
Platelets: 347 10*3/uL (ref 150–400)
RBC: 4.65 MIL/uL (ref 3.87–5.11)
RDW: 17.2 % — ABNORMAL HIGH (ref 11.5–15.5)
WBC: 9.5 10*3/uL (ref 4.0–10.5)

## 2017-06-13 LAB — I-STAT TROPONIN, ED: TROPONIN I, POC: 0 ng/mL (ref 0.00–0.08)

## 2017-06-13 LAB — BASIC METABOLIC PANEL
ANION GAP: 10 (ref 5–15)
BUN: 13 mg/dL (ref 6–20)
CALCIUM: 9.9 mg/dL (ref 8.9–10.3)
CHLORIDE: 101 mmol/L (ref 101–111)
CO2: 30 mmol/L (ref 22–32)
CREATININE: 0.72 mg/dL (ref 0.44–1.00)
GFR calc non Af Amer: >60 mL/min (ref >60)
Glucose, Bld: 101 mg/dL — ABNORMAL HIGH (ref 65–99)
Potassium: 3.1 mmol/L — ABNORMAL LOW (ref 3.5–5.1)
SODIUM: 141 mmol/L (ref 135–145)

## 2017-06-13 LAB — I-STAT BETA HCG BLOOD, ED (MC, WL, AP ONLY): HCG, QUANTITATIVE: 5.6 m[IU]/mL — AB (ref <5)

## 2017-06-13 MED ORDER — ACETAMINOPHEN 500 MG PO TABS
1000.0000 mg | ORAL_TABLET | Freq: Once | ORAL | Status: AC
  Administered 2017-06-13: 1000 mg via ORAL
  Filled 2017-06-13: qty 2

## 2017-06-13 MED ORDER — BENZONATATE 100 MG PO CAPS: 100.0000 mg | ORAL_CAPSULE | Freq: Three times a day (TID) | ORAL | 0 refills | Status: DC

## 2017-06-13 MED ORDER — KETOROLAC TROMETHAMINE 30 MG/ML IJ SOLN: 15.0000 mg | Freq: Once | INTRAMUSCULAR | Status: DC

## 2017-06-13 MED ORDER — KETOROLAC TROMETHAMINE 60 MG/2ML IM SOLN
15.0000 mg | Freq: Once | INTRAMUSCULAR | Status: AC
  Administered 2017-06-13: 15 mg via INTRAMUSCULAR
  Filled 2017-06-13: qty 2

## 2017-06-13 MED ORDER — HYDROCOD POLST-CPM POLST ER 10-8 MG/5ML PO SUER
5.0000 mL | Freq: Once | ORAL | Status: AC
  Administered 2017-06-13: 5 mL via ORAL
  Filled 2017-06-13: qty 5

## 2017-06-13 MED ORDER — ONDANSETRON 4 MG PO TBDP
4.0000 mg | ORAL_TABLET | Freq: Once | ORAL | Status: AC
  Administered 2017-06-13: 4 mg via ORAL
  Filled 2017-06-13: qty 1

## 2017-06-13 NOTE — ED Triage Notes (Signed)
Patient c/o central chest pain and SOB that started this morning. Patient reports that she is getting over URI.

## 2017-06-13 NOTE — ED Provider Notes (Signed)
[facility] DEPT Provider Note   CSN: [id number] Arrival date & time: 06/13/17  5188     History   Chief Complaint Chief Complaint  Patient presents with  . Chest Pain  . Shortness of Breath    HPI Shelly Cohen is a 56 y.o. female.  56 yo F with a chief complaint of left-sided chest pain.  This been a couple days.  Worse with coughing movement palpation.  Patient has had a URI for the past couple weeks.  That is starting to get significantly better though every time she takes a big deep breath or moves the pain in her chest is suddenly worse.  Also worse with lying back flat.  Denies fevers or chills denies shortness of breath.  Denies hemoptysis.  Denies prior history of PE or DVT.  Denies lower extremity edema denies estrogen use denies recent surgery denies prolonged travel.   The history is provided by the patient.  Chest Pain   This is a new problem. The current episode started 2 days ago. The problem occurs constantly. The problem has been rapidly worsening. The pain is associated with movement and breathing. The pain is present in the lateral region. The pain is at a severity of 9/10. The pain is moderate. The quality of the pain is described as brief, sharp and pleuritic. The pain does not radiate. Duration of episode(s) is 2 days. The symptoms are aggravated by certain positions. Pertinent negatives include no dizziness, no fever, no headaches, no nausea, no palpitations, no shortness of breath and no vomiting. She has tried nothing for the symptoms. The treatment provided no relief.  Shortness of Breath  Associated symptoms include chest pain. Pertinent negatives include no fever, no headaches, no rhinorrhea, no wheezing and no vomiting.    Past Medical History:  Diagnosis Date  . Alpha thalassemia trait   . Fibroadenoma   . Glaucoma    monitored every 87months  . History of positive PPD, treatment status unknown   . Hypertension   .  Thyroid disease     Patient Active Problem List   Diagnosis Date Noted  . Mass of breast, left 06/29/2016  . Thalassemia trait, alpha 06/29/2016  . Iron deficiency anemia 06/29/2016  . S/P bunionectomy 06/29/2016  . Great toe pain, right 06/29/2016  . Encounter for weight loss counseling 06/29/2016  . HTN (hypertension), benign 05/18/2016  . Hypothyroidism 05/18/2016    Past Surgical History:  Procedure Laterality Date  . ABDOMINAL HYSTERECTOMY  1989  . BREAST BIOPSY  2013  . BUNIONECTOMY  10/2014  . CHOLECYSTECTOMY  2017  . COLONOSCOPY    . GALLBLADDER SURGERY    . hallux limi    . LAPAROSCOPY    . TUBAL LIGATION      OB History    No data available       Home Medications    Prior to Admission medications   Medication Sig Start Date End Date Taking? Authorizing Provider  albuterol (PROVENTIL HFA;VENTOLIN HFA) 108 (90 Base) MCG/ACT inhaler Inhale 2 puffs into the lungs every 4 (four) hours as needed for wheezing or shortness of breath (cough, shortness of breath or wheezing.). 12/19/16   Elby Beck, FNP  aspirin EC 81 MG tablet Take by mouth.    [provider]  benzonatate (TESSALON) 100 MG capsule Take 1 capsule (100 mg total) by mouth every 8 (eight) hours. 06/13/17   Deno Etienne, DO  citalopram (CELEXA) 20 MG tablet Take 1  tablet (20 mg total) by mouth daily. 01/04/17   Nche, Charlene Brooke, NP  ferrous sulfate 325 (65 FE) MG tablet Take by mouth.    [provider]  levothyroxine (SYNTHROID, LEVOTHROID) 50 MCG tablet Take 1 tablet (50 mcg total) by mouth daily before breakfast. 01/04/17   Nche, Charlene Brooke, NP  lisinopril-hydrochlorothiazide (PRINZIDE,ZESTORETIC) 10-12.5 MG tablet Take 1 tablet by mouth daily. 01/04/17   Nche, Charlene Brooke, NP  metoprolol succinate (TOPROL-XL) 50 MG 24 hr tablet Take 1 tablet (50 mg total) by mouth daily. Take with or immediately following a meal. 01/04/17   Nche, Charlene Brooke, NP  Multiple Vitamin  (MULTI-VITAMINS) TABS Take by mouth.    [provider]  Omega-3 Fatty Acids (FISH OIL PO) Take by mouth.    [provider]  Polyethylene Glycol 3350 (MIRALAX PO) Take by mouth.    [provider]  potassium chloride (K-DUR) 10 MEQ tablet Take 1 tablet (10 mEq total) by mouth daily. 01/04/17   Nche, Charlene Brooke, NP    Family History Family History  Problem Relation Age of Onset  . Hypertension Mother   . Anemia Mother   . Arthritis Mother   . Kidney disease Mother   . Hypertension Sister   . Hypertension Brother   . Cancer Maternal Grandmother   . Cancer Maternal Grandfather     Social History Social History   Tobacco Use  . Smoking status: Never Smoker  . Smokeless tobacco: Never Used  Substance Use Topics  . Alcohol use: No  . Drug use: No     Allergies   Diphenhydramine hcl; Hydrocodone; Azithromycin; Cephalexin; Penicillin g; and Zolpidem   Review of Systems Review of Systems  Constitutional: Negative for chills and fever.  HENT: Negative for congestion and rhinorrhea.   Eyes: Negative for redness and visual disturbance.  Respiratory: Negative for shortness of breath and wheezing.   Cardiovascular: Positive for chest pain. Negative for palpitations.  Gastrointestinal: Negative for nausea and vomiting.  Genitourinary: Negative for dysuria and urgency.  Musculoskeletal: Negative for arthralgias and myalgias.  Skin: Negative for pallor and wound.  Neurological: Negative for dizziness and headaches.     Physical Exam Updated Vital Signs BP (!) 154/74   Pulse 69   Temp 98 F (36.7 C) (Oral)   Resp 10   Ht 5\' 3"  (1.6 m)   Wt 90.3 kg (199 lb)   SpO2 99%   BMI 35.25 kg/m   Physical Exam  Constitutional: She is oriented to person, place, and time. She appears well-developed and well-nourished. No distress.  HENT:  Head: Normocephalic and atraumatic.  Eyes: EOM are normal. Pupils are equal, round, and reactive to light.  Neck:  Normal range of motion. Neck supple.  Cardiovascular: Normal rate and regular rhythm. Exam reveals no gallop and no friction rub.  No murmur heard. Pulmonary/Chest: Effort normal. She has no wheezes. She has no rales. She exhibits tenderness ( Reproduces pain).  Abdominal: Soft. She exhibits no distension. There is no tenderness.  Musculoskeletal: She exhibits no edema or tenderness.  Neurological: She is alert and oriented to person, place, and time.  Skin: Skin is warm and dry. She is not diaphoretic.  Psychiatric: She has a normal mood and affect. Her behavior is normal.  Nursing note and vitals reviewed.    ED Treatments / Results  Labs (all labs ordered are listed, but only abnormal results are displayed) Labs Reviewed  BASIC METABOLIC PANEL - Abnormal; Notable for the following  components:      Result Value   Potassium 3.1 (*)    Glucose, Bld 101 (*)    All other components within normal limits  CBC - Abnormal; Notable for the following components:   Hemoglobin 11.9 (*)    MCH 25.6 (*)    RDW 17.2 (*)    All other components within normal limits  I-STAT BETA HCG BLOOD, ED ([facility], [facility], [facility] ONLY) - Abnormal; Notable for the following components:   I-stat hCG, quantitative 5.6 (*)    All other components within normal limits  I-STAT TROPONIN, ED    EKG  EKG Interpretation  Date/Time:  Thursday June 13 2017 09:30:50 EST Ventricular Rate:  92 PR Interval:    QRS Duration: 88 QT Interval:  354 QTC Calculation: 438 R Axis:   12 Text Interpretation:  Sinus rhythm Probable LVH with secondary repol abnrm Nonspecific T wave abnormality No old tracing to compare Confirmed by Deno Etienne [phone]) on 06/13/2017 11:05:46 AM       Radiology Dg Chest 2 View  Result Date: 06/13/2017 CLINICAL DATA:  Chest pain and upper respiratory tract symptoms EXAM: CHEST  2 VIEW COMPARISON:  None. FINDINGS: The lungs are clear. Heart size and pulmonary vascularity are normal. No adenopathy. No  pneumothorax. No bone lesions. IMPRESSION: No edema or consolidation. Electronically Signed   By: Lowella Grip III M.D.   On: 06/13/2017 10:04    Procedures Procedures (including critical care time)  Medications Ordered in ED Medications  acetaminophen (TYLENOL) tablet 1,000 mg (1,000 mg Oral Given 06/13/17 1227)  chlorpheniramine-HYDROcodone (TUSSIONEX) 10-8 MG/5ML suspension 5 mL (5 mLs Oral Given 06/13/17 1228)  ondansetron (ZOFRAN-ODT) disintegrating tablet 4 mg (4 mg Oral Given 06/13/17 1227)  ketorolac (TORADOL) injection 15 mg (15 mg Intramuscular Given 06/13/17 1227)     Initial Impression / Assessment and Plan / ED Course  I have reviewed the triage vital signs and the nursing notes.  Pertinent labs & imaging results that were available during my care of the patient were reviewed by me and considered in my medical decision making (see chart for details).     89 y F with a chief complaint of left-sided chest wall pain.  This is reproduced on exam.  Likely this is secondary to coughing.  Chest x-ray is negative.  Troponin negative.  EKG unchanged.  Discharge home.  3:39 PM:  I have discussed the diagnosis/risks/treatment options with the patient and family and believe the pt to be eligible for discharge home to follow-up with PCP. We also discussed returning to the ED immediately if new or worsening sx occur. We discussed the sx which are most concerning (e.g., sudden worsening pain, fever, inability to tolerate by mouth) that necessitate immediate return. Medications administered to the patient during their visit and any new prescriptions provided to the patient are listed below.  Medications given during this visit Medications  acetaminophen (TYLENOL) tablet 1,000 mg (1,000 mg Oral Given 06/13/17 1227)  chlorpheniramine-HYDROcodone (TUSSIONEX) 10-8 MG/5ML suspension 5 mL (5 mLs Oral Given 06/13/17 1228)  ondansetron (ZOFRAN-ODT) disintegrating tablet 4 mg (4 mg Oral Given  06/13/17 1227)  ketorolac (TORADOL) injection 15 mg (15 mg Intramuscular Given 06/13/17 1227)     The patient appears reasonably screen and/or stabilized for discharge and I doubt any other medical condition or other [facility] requiring further screening, evaluation, or treatment in the ED at this time prior to discharge.    Final Clinical Impressions(s) / ED Diagnoses   Final  diagnoses:  Atypical chest pain    ED Discharge Orders        Ordered    benzonatate (TESSALON) 100 MG capsule  Every 8 hours     06/13/17 [place], [place], DO 06/13/17 1539

## 2017-06-13 NOTE — Discharge Instructions (Signed)
Take 4 over the counter ibuprofen tablets 3 times a day or 2 over-the-counter naproxen tablets twice a day for pain. Also take tylenol 1000mg(2 extra strength) four times a day.    

## 2017-06-14 ENCOUNTER — Ambulatory Visit (INDEPENDENT_AMBULATORY_CARE_PROVIDER_SITE_OTHER): Payer: 59 | Admitting: Nurse Practitioner

## 2017-06-14 ENCOUNTER — Encounter: Payer: Self-pay | Admitting: Nurse Practitioner

## 2017-06-14 VITALS — BP 170/84 | HR 79 | Temp 97.8°F | Ht 63.0 in | Wt 212.0 lb

## 2017-06-14 DIAGNOSIS — E039 Hypothyroidism, unspecified: Secondary | ICD-10-CM | POA: Diagnosis not present

## 2017-06-14 DIAGNOSIS — I10 Essential (primary) hypertension: Secondary | ICD-10-CM | POA: Diagnosis not present

## 2017-06-14 DIAGNOSIS — J209 Acute bronchitis, unspecified: Secondary | ICD-10-CM

## 2017-06-14 DIAGNOSIS — N951 Menopausal and female climacteric states: Secondary | ICD-10-CM | POA: Diagnosis not present

## 2017-06-14 MED ORDER — CITALOPRAM HYDROBROMIDE 20 MG PO TABS: 20.0000 mg | ORAL_TABLET | Freq: Every day | ORAL | 1 refills | Status: DC

## 2017-06-14 MED ORDER — METOPROLOL SUCCINATE ER 50 MG PO TB24: 50.0000 mg | ORAL_TABLET | Freq: Every day | ORAL | 1 refills | Status: DC

## 2017-06-14 MED ORDER — LISINOPRIL-HYDROCHLOROTHIAZIDE 10-12.5 MG PO TABS: 1.0000 | ORAL_TABLET | Freq: Every day | ORAL | 1 refills | Status: DC

## 2017-06-14 MED ORDER — POTASSIUM CHLORIDE ER 10 MEQ PO TBCR: 10.0000 meq | EXTENDED_RELEASE_TABLET | Freq: Every day | ORAL | 1 refills | Status: DC

## 2017-06-14 MED ORDER — ALBUTEROL SULFATE HFA 108 (90 BASE) MCG/ACT IN AERS: 2.0000 | INHALATION_SPRAY | RESPIRATORY_TRACT | 0 refills | Status: DC | PRN

## 2017-06-14 MED ORDER — LEVOTHYROXINE SODIUM 50 MCG PO TABS: 50.0000 ug | ORAL_TABLET | Freq: Every day | ORAL | 1 refills | Status: DC

## 2017-06-14 MED FILL — CITALOPRAM HBR 20 MG TABLET: 20 | 90 days supply | Qty: 90 | Fill #0

## 2017-06-14 MED FILL — LISINOPRIL-HCTZ 10-12.5 MG: 10-12.5 | 90 days supply | Qty: 90 | Fill #0

## 2017-06-14 MED FILL — BENZONATATE 100 MG CAP: 100 | 7 days supply | Qty: 21 | Fill #0

## 2017-06-14 MED FILL — METOPROLOL SUCC ER 50 MG TA: 50 | 90 days supply | Qty: 90 | Fill #1

## 2017-06-14 MED FILL — VENTOLIN HFA 90 MCG INHALER: 108 (90 BAS | 16 days supply | Qty: 18 | Fill #0

## 2017-06-14 MED FILL — POTASSIUM CL ER 10 MEQ TABL: 10 | 90 days supply | Qty: 90 | Fill #1

## 2017-06-14 NOTE — Patient Instructions (Addendum)
Please return to office in 1week for BP re eval. Bring your home BP monitor for comparison.  Print phentermine Rx if BP <150/90 in 1week.   Calorie Counting for Weight Loss Calories are units of energy. Your body needs a certain amount of calories from food to keep you going throughout the day. When you eat more calories than your body needs, your body stores the extra calories as fat. When you eat fewer calories than your body needs, your body burns fat to get the energy it needs. Calorie counting means keeping track of how many calories you eat and drink each day. Calorie counting can be helpful if you need to lose weight. If you make sure to eat fewer calories than your body needs, you should lose weight. Ask your health care provider what a healthy weight is for you. For calorie counting to work, you will need to eat the right number of calories in a day in order to lose a healthy amount of weight per week. A dietitian can help you determine how many calories you need in a day and will give you suggestions on how to reach your calorie goal.  A healthy amount of weight to lose per week is usually 1-2 lb (0.5-0.9 kg). This usually means that your daily calorie intake should be reduced by 500-750 calories.  Eating 1,200 - 1,500 calories per day can help most women lose weight.  Eating 1,500 - 1,800 calories per day can help most men lose weight.  What is my plan? My goal is to have __________ calories per day. If I have this many calories per day, I should lose around __________ pounds per week. What do I need to know about calorie counting? In order to meet your daily calorie goal, you will need to:  Find out how many calories are in each food you would like to eat. Try to do this before you eat.  Decide how much of the food you plan to eat.  Write down what you ate and how many calories it had. Doing this is called keeping a food log.  To successfully lose weight, it is important to  balance calorie counting with a healthy lifestyle that includes regular activity. Aim for 150 minutes of moderate exercise (such as walking) or 75 minutes of vigorous exercise (such as running) each week. Where do I find calorie information?  The number of calories in a food can be found on a Nutrition Facts label. If a food does not have a Nutrition Facts label, try to look up the calories online or ask your dietitian for help. Remember that calories are listed per serving. If you choose to have more than one serving of a food, you will have to multiply the calories per serving by the amount of servings you plan to eat. For example, the label on a package of bread might say that a serving size is 1 slice and that there are 90 calories in a serving. If you eat 1 slice, you will have eaten 90 calories. If you eat 2 slices, you will have eaten 180 calories. How do I keep a food log? Immediately after each meal, record the following information in your food log:  What you ate. Don't forget to include toppings, sauces, and other extras on the food.  How much you ate. This can be measured in cups, ounces, or number of items.  How many calories each food and drink had.  The total number of  calories in the meal.  Keep your food log near you, such as in a small notebook in your pocket, or use a mobile app or website. Some programs will calculate calories for you and show you how many calories you have left for the day to meet your goal. What are some calorie counting tips?  Use your calories on foods and drinks that will fill you up and not leave you hungry: ? Some examples of foods that fill you up are nuts and nut butters, vegetables, lean proteins, and high-fiber foods like whole grains. High-fiber foods are foods with more than 5 g fiber per serving. ? Drinks such as sodas, specialty coffee drinks, alcohol, and juices have a lot of calories, yet do not fill you up.  Eat nutritious foods and avoid  empty calories. Empty calories are calories you get from foods or beverages that do not have many vitamins or protein, such as candy, sweets, and soda. It is better to have a nutritious high-calorie food (such as an avocado) than a food with few nutrients (such as a bag of chips).  Know how many calories are in the foods you eat most often. This will help you calculate calorie counts faster.  Pay attention to calories in drinks. Low-calorie drinks include water and unsweetened drinks.  Pay attention to nutrition labels for "low fat" or "fat free" foods. These foods sometimes have the same amount of calories or more calories than the full fat versions. They also often have added sugar, starch, or salt, to make up for flavor that was removed with the fat.  Find a way of tracking calories that works for you. Get creative. Try different apps or programs if writing down calories does not work for you. What are some portion control tips?  Know how many calories are in a serving. This will help you know how many servings of a certain food you can have.  Use a measuring cup to measure serving sizes. You could also try weighing out portions on a kitchen scale. With time, you will be able to estimate serving sizes for some foods.  Take some time to put servings of different foods on your favorite plates, bowls, and cups so you know what a serving looks like.  Try not to eat straight from a bag or box. Doing this can lead to overeating. Put the amount you would like to eat in a cup or on a plate to make sure you are eating the right portion.  Use smaller plates, glasses, and bowls to prevent overeating.  Try not to multitask (for example, watch TV or use your computer) while eating. If it is time to eat, sit down at a table and enjoy your food. This will help you to know when you are full. It will also help you to be aware of what you are eating and how much you are eating. What are tips for following  this plan? Reading food labels  Check the calorie count compared to the serving size. The serving size may be smaller than what you are used to eating.  Check the source of the calories. Make sure the food you are eating is high in vitamins and protein and low in saturated and trans fats. Shopping  Read nutrition labels while you shop. This will help you make healthy decisions before you decide to purchase your food.  Make a grocery list and stick to it. Cooking  Try to cook your favorite foods in  a healthier way. For example, try baking instead of frying.  Use low-fat dairy products. Meal planning  Use more fruits and vegetables. Half of your plate should be fruits and vegetables.  Include lean proteins like poultry and fish. How do I count calories when eating out?  Ask for smaller portion sizes.  Consider sharing an entree and sides instead of getting your own entree.  If you get your own entree, eat only half. Ask for a box at the beginning of your meal and put the rest of your entree in it so you are not tempted to eat it.  If calories are listed on the menu, choose the lower calorie options.  Choose dishes that include vegetables, fruits, whole grains, low-fat dairy products, and lean protein.  Choose items that are boiled, broiled, grilled, or steamed. Stay away from items that are buttered, battered, fried, or served with cream sauce. Items labeled "crispy" are usually fried, unless stated otherwise.  Choose water, low-fat milk, unsweetened iced tea, or other drinks without added sugar. If you want an alcoholic beverage, choose a lower calorie option such as a glass of wine or light beer.  Ask for dressings, sauces, and syrups on the side. These are usually high in calories, so you should limit the amount you eat.  If you want a salad, choose a garden salad and ask for grilled meats. Avoid extra toppings like bacon, cheese, or fried items. Ask for the dressing on the  side, or ask for olive oil and vinegar or lemon to use as dressing.  Estimate how many servings of a food you are given. For example, a serving of cooked rice is  cup or about the size of half a baseball. Knowing serving sizes will help you be aware of how much food you are eating at restaurants. The list below tells you how big or small some common portion sizes are based on everyday objects: ? 1 oz-4 stacked dice. ? 3 oz-1 deck of cards. ? 1 tsp-1 die. ? 1 Tbsp- a ping-pong ball. ? 2 Tbsp-1 ping-pong ball. ?  cup- baseball. ? 1 cup-1 baseball. Summary  Calorie counting means keeping track of how many calories you eat and drink each day. If you eat fewer calories than your body needs, you should lose weight.  A healthy amount of weight to lose per week is usually 1-2 lb (0.5-0.9 kg). This usually means reducing your daily calorie intake by 500-750 calories.  The number of calories in a food can be found on a Nutrition Facts label. If a food does not have a Nutrition Facts label, try to look up the calories online or ask your dietitian for help.  Use your calories on foods and drinks that will fill you up, and not on foods and drinks that will leave you hungry.  Use smaller plates, glasses, and bowls to prevent overeating. This information is not intended to replace advice given to you by your health care provider. Make sure you discuss any questions you have with your health care provider. Document Released: 06/18/2005 Document Revised: 05/18/2016 Document Reviewed: 05/18/2016 Elsevier Interactive Patient Education  2017 [employer].   Exercising to [employer] Exercising can help you to lose weight. In order to lose weight through exercise, you need to do vigorous-intensity exercise. You can tell that you are exercising with vigorous intensity if you are breathing very hard and fast and cannot hold a conversation while exercising. Moderate-intensity exercise helps to maintain your  current weight. You can tell that you are exercising at a moderate level if you have a higher heart rate and faster breathing, but you are still able to hold a conversation. How often should I exercise? Choose an activity that you enjoy and set realistic goals. Your health care provider can help you to make an activity plan that works for you. Exercise regularly as directed by your health care provider. This may include:  Doing resistance training twice each week, such as: ? Push-ups. ? Sit-ups. ? Lifting weights. ? Using resistance bands.  Doing a given intensity of exercise for a given amount of time. Choose from these options: ? 150 minutes of moderate-intensity exercise every week. ? 75 minutes of vigorous-intensity exercise every week. ? A mix of moderate-intensity and vigorous-intensity exercise every week.  Children, pregnant women, people who are out of shape, people who are overweight, and older adults may need to consult a health care provider for individual recommendations. If you have any sort of medical condition, be sure to consult your health care provider before starting a new exercise program. What are some activities that can help me to lose weight?  Walking at a rate of at least 4.5 miles an hour.  Jogging or running at a rate of 5 miles per hour.  Biking at a rate of at least 10 miles per hour.  Lap swimming.  Roller-skating or in-line skating.  Cross-country skiing.  Vigorous competitive sports, such as football, basketball, and soccer.  Jumping rope.  Aerobic dancing. How can I be more active in my day-to-day activities?  Use the stairs instead of the elevator.  Take a walk during your lunch break.  If you drive, park your car farther away from work or school.  If you take public transportation, get off one stop early and walk the rest of the way.  Make all of your phone calls while standing up and walking around.  Get up, stretch, and walk around  every 30 minutes throughout the day. What guidelines should I follow while exercising?  Do not exercise so much that you hurt yourself, feel dizzy, or get very short of breath.  Consult your health care provider prior to starting a new exercise program.  Wear comfortable clothes and shoes with good support.  Drink plenty of water while you exercise to prevent dehydration or heat stroke. Body water is lost during exercise and must be replaced.  Work out until you breathe faster and your heart beats faster. This information is not intended to replace advice given to you by your health care provider. Make sure you discuss any questions you have with your health care provider. Document Released: 07/21/2010 Document Revised: 11/24/2015 Document Reviewed: 11/19/2013 Elsevier Interactive Patient Education  [employer].

## 2017-06-14 NOTE — Progress Notes (Signed)
Subjective:  Patient ID: Shelly Cohen, female    DOB: [birth year]  Age: 56 y.o. MRN: [id number]  CC: Follow-up (6 mo fu/ med refills) and Fall (fall on monday hit left side face/sore)  Fall  The accident occurred 12 to 24 hours ago. The fall occurred while walking. She fell from an unknown height. She landed on concrete. The point of impact was the buttocks (hit face with cooking pain she was holding). The pain is present in the face (left cheek). The pain is mild. Pertinent negatives include no abdominal pain, bowel incontinence, fever, headaches, hearing loss, hematuria, loss of consciousness, nausea, numbness, tingling, visual change or vomiting. She has tried ice for the symptoms. The treatment provided moderate relief.  " I slipped on ice yesterday while trying to shovel snow with baking pan"  HTN: BP readings at home: 120s/80s to 140/90s. Elevated reading in last 30month. Reports she has been sick with acute URI: treated with oral prednisone, albuterol and cough suppressant. She denies use of any decongestant. BP Readings from Last 3 Encounters:  06/14/17 (!) 170/84  06/13/17 (!) 154/74  06/05/17 (!) 207/83   URI: cough and nasal congestion have improved in last 1week. Needs albuterol refilled.   Outpatient Medications Prior to Visit  Medication Sig Dispense Refill  . aspirin EC 81 MG tablet Take by mouth.    . benzonatate (TESSALON) 100 MG capsule Take 1 capsule (100 mg total) by mouth every 8 (eight) hours. 21 capsule 0  . ferrous sulfate 325 (65 FE) MG tablet Take by mouth.    . Multiple Vitamin (MULTI-VITAMINS) TABS Take by mouth.    . Omega-3 Fatty Acids (FISH OIL PO) Take by mouth.    . Polyethylene Glycol 3350 (MIRALAX PO) Take by mouth.    Marland Kitchen albuterol (PROVENTIL HFA;VENTOLIN HFA) 108 (90 Base) MCG/ACT inhaler Inhale 2 puffs into the lungs every 4 (four) hours as needed for wheezing or shortness of breath (cough, shortness of breath or wheezing.). 1 Inhaler 1  .  citalopram (CELEXA) 20 MG tablet Take 1 tablet (20 mg total) by mouth daily. 30 tablet 3  . levothyroxine (SYNTHROID, LEVOTHROID) 50 MCG tablet Take 1 tablet (50 mcg total) by mouth daily before breakfast. 90 tablet 1  . lisinopril-hydrochlorothiazide (PRINZIDE,ZESTORETIC) 10-12.5 MG tablet Take 1 tablet by mouth daily. 90 tablet 1  . metoprolol succinate (TOPROL-XL) 50 MG 24 hr tablet Take 1 tablet (50 mg total) by mouth daily. Take with or immediately following a meal. 90 tablet 1  . potassium chloride (K-DUR) 10 MEQ tablet Take 1 tablet (10 mEq total) by mouth daily. 90 tablet 1   No facility-administered medications prior to visit.     ROS See   Objective:  BP (!) 170/84   Pulse 79   Temp 97.8 F (36.6 C)   Ht 5\' 3"  (1.6 m)   Wt 212 lb (96.2 kg)   SpO2 98%   BMI 37.55 kg/m   BP Readings from Last 3 Encounters:  06/14/17 (!) 170/84  06/13/17 (!) 154/74  06/05/17 (!) 207/83    Wt Readings from Last 3 Encounters:  06/14/17 212 lb (96.2 kg)  06/13/17 199 lb (90.3 kg)  01/04/17 201 lb (91.2 kg)    Physical Exam  Constitutional: She is oriented to person, place, and time. No distress.  HENT:  Head: Head is with contusion. Head is without raccoon's eyes, without Battle's sign, without abrasion, without laceration, without right periorbital erythema and without left periorbital erythema.  Right Ear: Tympanic membrane, external ear and ear canal normal.  Left Ear: Tympanic membrane, external ear and ear canal normal.  Nose: No nasal deformity. Right sinus exhibits no maxillary sinus tenderness and no frontal sinus tenderness. Left sinus exhibits maxillary sinus tenderness. Left sinus exhibits no frontal sinus tenderness.  Eyes: Conjunctivae, EOM and lids are normal. Pupils are equal, round, and reactive to light.  Neck: Normal range of motion. Neck supple.  Cardiovascular: Normal rate and regular rhythm.  Pulmonary/Chest: Effort normal and breath sounds normal.    Musculoskeletal: Normal range of motion. She exhibits no tenderness.  Lymphadenopathy:    She has no cervical adenopathy.  Neurological: She is alert and oriented to person, place, and time.  Skin: Skin is warm and dry.  Psychiatric: She has a normal mood and affect. Her behavior is normal.  Vitals reviewed.   Lab Results  Component Value Date   WBC 9.5 06/13/2017   HGB 11.9 (L) 06/13/2017   HCT 37.3 06/13/2017   PLT 347 06/13/2017   GLUCOSE 101 (H) 06/13/2017   CHOL 187 01/04/2017   TRIG 90.0 01/04/2017   HDL 55.90 01/04/2017   LDLCALC 113 (H) 01/04/2017   ALT 11 01/04/2017   AST 12 01/04/2017   NA 141 06/13/2017   K 3.1 (L) 06/13/2017   CL 101 06/13/2017   CREATININE 0.72 06/13/2017   BUN 13 06/13/2017   CO2 30 06/13/2017   TSH 2.01 01/04/2017    Dg Chest 2 View  Result Date: 06/13/2017 CLINICAL DATA:  Chest pain and upper respiratory tract symptoms EXAM: CHEST  2 VIEW COMPARISON:  None. FINDINGS: The lungs are clear. Heart size and pulmonary vascularity are normal. No adenopathy. No pneumothorax. No bone lesions. IMPRESSION: No edema or consolidation. Electronically Signed   By: Lowella Grip III M.D.   On: 06/13/2017 10:04    Assessment & Plan:   Shelly Cohen was seen today for follow-up and fall.  Diagnoses and all orders for this visit:  HTN (hypertension), benign -     lisinopril-hydrochlorothiazide (PRINZIDE,ZESTORETIC) 10-12.5 MG tablet; Take 1 tablet by mouth daily. -     metoprolol succinate (TOPROL-XL) 50 MG 24 hr tablet; Take 1 tablet (50 mg total) by mouth daily. Take with or immediately following a meal. -     potassium chloride (K-DUR) 10 MEQ tablet; Take 1 tablet (10 mEq total) by mouth daily.  Hypothyroidism, unspecified type -     levothyroxine (SYNTHROID, LEVOTHROID) 50 MCG tablet; Take 1 tablet (50 mcg total) by mouth daily before breakfast.  Hot flashes due to menopause -     citalopram (CELEXA) 20 MG tablet; Take 1 tablet (20 mg total) by  mouth daily.  Acute bronchitis, unspecified organism -     albuterol (PROVENTIL HFA;VENTOLIN HFA) 108 (90 Base) MCG/ACT inhaler; Inhale 2 puffs into the lungs every 4 (four) hours as needed for wheezing or shortness of breath (cough, shortness of breath or wheezing.).   I am having [name] maintain her MULTI-VITAMINS, ferrous sulfate, aspirin EC, Omega-3 Fatty Acids (FISH OIL PO), Polyethylene Glycol 3350 (MIRALAX PO), benzonatate, levothyroxine, citalopram, lisinopril-hydrochlorothiazide, metoprolol succinate, potassium chloride, and albuterol.  Meds ordered this encounter  Medications  . levothyroxine (SYNTHROID, LEVOTHROID) 50 MCG tablet    Sig: Take 1 tablet (50 mcg total) by mouth daily before breakfast.    Dispense:  90 tablet    Refill:  1    Order Specific Question:   Supervising Provider    Answer:   Deborra Medina,  TALIA M [[id number]]  . citalopram (CELEXA) 20 MG tablet    Sig: Take 1 tablet (20 mg total) by mouth daily.    Dispense:  90 tablet    Refill:  1    Order Specific Question:   Supervising Provider    Answer:   Lucille Passy [[id number]]  . lisinopril-hydrochlorothiazide (PRINZIDE,ZESTORETIC) 10-12.5 MG tablet    Sig: Take 1 tablet by mouth daily.    Dispense:  90 tablet    Refill:  1    Order Specific Question:   Supervising Provider    Answer:   Lucille Passy [[id number]]  . metoprolol succinate (TOPROL-XL) 50 MG 24 hr tablet    Sig: Take 1 tablet (50 mg total) by mouth daily. Take with or immediately following a meal.    Dispense:  90 tablet    Refill:  1    Order Specific Question:   Supervising Provider    Answer:   Lucille Passy [[id number]]  . potassium chloride (K-DUR) 10 MEQ tablet    Sig: Take 1 tablet (10 mEq total) by mouth daily.    Dispense:  90 tablet    Refill:  1    Order Specific Question:   Supervising Provider    Answer:   Lucille Passy [[id number]]  . albuterol (PROVENTIL HFA;VENTOLIN HFA) 108 (90 Base) MCG/ACT inhaler    Sig: Inhale 2 puffs into the lungs every  4 (four) hours as needed for wheezing or shortness of breath (cough, shortness of breath or wheezing.).    Dispense:  1 Inhaler    Refill:  0    Order Specific Question:   Supervising Provider    Answer:   Lucille Passy [[id number]]    Follow-up: Return in about 1 week (around 06/21/2017) for BP check (nurse visit).  Wilfred Lacy, NP

## 2017-06-21 ENCOUNTER — Ambulatory Visit: Payer: 59 | Admitting: Nurse Practitioner

## 2017-06-28 ENCOUNTER — Ambulatory Visit: Payer: 59 | Admitting: Nurse Practitioner

## 2017-07-02 HISTORY — PX: OTHER SURGICAL HISTORY: SHX169

## 2017-08-02 ENCOUNTER — Ambulatory Visit: Payer: Self-pay | Admitting: Nurse Practitioner

## 2017-08-02 ENCOUNTER — Encounter: Payer: Self-pay | Admitting: Nurse Practitioner

## 2017-08-02 ENCOUNTER — Ambulatory Visit: Payer: 59 | Admitting: Nurse Practitioner

## 2017-08-02 DIAGNOSIS — I10 Essential (primary) hypertension: Secondary | ICD-10-CM | POA: Diagnosis not present

## 2017-08-02 DIAGNOSIS — E039 Hypothyroidism, unspecified: Secondary | ICD-10-CM

## 2017-08-02 MED ORDER — LISINOPRIL-HYDROCHLOROTHIAZIDE 10-12.5 MG PO TABS: 1.0000 | ORAL_TABLET | Freq: Every day | ORAL | 1 refills | Status: DC

## 2017-08-02 MED ORDER — POTASSIUM CHLORIDE ER 10 MEQ PO TBCR: 10.0000 meq | EXTENDED_RELEASE_TABLET | Freq: Every day | ORAL | 1 refills | Status: DC

## 2017-08-02 MED ORDER — LEVOTHYROXINE SODIUM 50 MCG PO TABS: 50.0000 ug | ORAL_TABLET | Freq: Every day | ORAL | 1 refills | Status: DC

## 2017-08-02 MED ORDER — METOPROLOL SUCCINATE ER 50 MG PO TB24: 50.0000 mg | ORAL_TABLET | Freq: Every day | ORAL | 1 refills | Status: DC

## 2017-08-02 NOTE — Progress Notes (Signed)
Subjective:  Patient ID: Shelly Cohen, female    DOB: 10-18-1960  Age: 57 y.o. MRN: [id number]  CC: Follow-up (BP follow up. has BP cup with her today. loss both brother pass away with 1 mo)  HPI  HTN: Improved BP with lisinopril/HCTZ and metoprolol. BP Readings from Last 3 Encounters:  08/02/17 134/72  06/14/17 (!) 170/84  06/13/17 (!) 154/74   Outpatient Medications Prior to Visit  Medication Sig Dispense Refill  . aspirin EC 81 MG tablet Take by mouth.    . ferrous sulfate 325 (65 FE) MG tablet Take by mouth.    . Multiple Vitamin (MULTI-VITAMINS) TABS Take by mouth.    . Omega-3 Fatty Acids (FISH OIL PO) Take by mouth.    . Polyethylene Glycol 3350 (MIRALAX PO) Take by mouth.    Marland Kitchen albuterol (PROVENTIL HFA;VENTOLIN HFA) 108 (90 Base) MCG/ACT inhaler Inhale 2 puffs into the lungs every 4 (four) hours as needed for wheezing or shortness of breath (cough, shortness of breath or wheezing.). 1 Inhaler 0  . benzonatate (TESSALON) 100 MG capsule Take 1 capsule (100 mg total) by mouth every 8 (eight) hours. 21 capsule 0  . citalopram (CELEXA) 20 MG tablet Take 1 tablet (20 mg total) by mouth daily. 90 tablet 1  . levothyroxine (SYNTHROID, LEVOTHROID) 50 MCG tablet Take 1 tablet (50 mcg total) by mouth daily before breakfast. 90 tablet 1  . lisinopril-hydrochlorothiazide (PRINZIDE,ZESTORETIC) 10-12.5 MG tablet Take 1 tablet by mouth daily. 90 tablet 1  . metoprolol succinate (TOPROL-XL) 50 MG 24 hr tablet Take 1 tablet (50 mg total) by mouth daily. Take with or immediately following a meal. 90 tablet 1  . potassium chloride (K-DUR) 10 MEQ tablet Take 1 tablet (10 mEq total) by mouth daily. 90 tablet 1   No facility-administered medications prior to visit.     ROS See HPI  Objective:  BP 134/72 (BP Location: Left Arm, Patient Position: Sitting, Cuff Size: Large)   Pulse 79   Temp 97.7 F (36.5 C) (Oral)   Ht 5\' 3"  (1.6 m)   Wt 212 lb (96.2 kg)   SpO2 98%   BMI 37.55 kg/m    BP Readings from Last 3 Encounters:  08/02/17 134/72  06/14/17 (!) 170/84  06/13/17 (!) 154/74    Wt Readings from Last 3 Encounters:  08/02/17 212 lb (96.2 kg)  06/14/17 212 lb (96.2 kg)  06/13/17 199 lb (90.3 kg)    Physical Exam  Constitutional: She is oriented to person, place, and time.  Neck: Normal range of motion. Neck supple. No thyromegaly present.  Cardiovascular: Normal rate and regular rhythm.  Pulmonary/Chest: Effort normal and breath sounds normal.  Musculoskeletal: She exhibits no edema.  Neurological: She is alert and oriented to person, place, and time.  Vitals reviewed.   Lab Results  Component Value Date   WBC 9.5 06/13/2017   HGB 11.9 (L) 06/13/2017   HCT 37.3 06/13/2017   PLT 347 06/13/2017   GLUCOSE 101 (H) 06/13/2017   CHOL 187 01/04/2017   TRIG 90.0 01/04/2017   HDL 55.90 01/04/2017   LDLCALC 113 (H) 01/04/2017   ALT 11 01/04/2017   AST 12 01/04/2017   NA 141 06/13/2017   K 3.1 (L) 06/13/2017   CL 101 06/13/2017   CREATININE 0.72 06/13/2017   BUN 13 06/13/2017   CO2 30 06/13/2017   TSH 2.01 01/04/2017    Dg Chest 2 View  Result Date: 06/13/2017 CLINICAL DATA:  Chest pain and upper  respiratory tract symptoms EXAM: CHEST  2 VIEW COMPARISON:  None. FINDINGS: The lungs are clear. Heart size and pulmonary vascularity are normal. No adenopathy. No pneumothorax. No bone lesions. IMPRESSION: No edema or consolidation. Electronically Signed   By: Lowella Grip III M.D.   On: 06/13/2017 10:04    Assessment & Plan:   Krissi was seen today for follow-up.  Diagnoses and all orders for this visit:  HTN (hypertension), benign -     potassium chloride (K-DUR) 10 MEQ tablet; Take 1 tablet (10 mEq total) by mouth daily. -     metoprolol succinate (TOPROL-XL) 50 MG 24 hr tablet; Take 1 tablet (50 mg total) by mouth daily. Take with or immediately following a meal. -     lisinopril-hydrochlorothiazide (PRINZIDE,ZESTORETIC) 10-12.5 MG tablet; Take  1 tablet by mouth daily.  Hypothyroidism, unspecified type -     levothyroxine (SYNTHROID, LEVOTHROID) 50 MCG tablet; Take 1 tablet (50 mcg total) by mouth daily before breakfast.   I have discontinued [name]'s benzonatate, citalopram, and albuterol. I am also having her maintain her MULTI-VITAMINS, ferrous sulfate, aspirin EC, Omega-3 Fatty Acids (FISH OIL PO), Polyethylene Glycol 3350 (MIRALAX PO), potassium chloride, metoprolol succinate, lisinopril-hydrochlorothiazide, and levothyroxine.  Meds ordered this encounter  Medications  . potassium chloride (K-DUR) 10 MEQ tablet    Sig: Take 1 tablet (10 mEq total) by mouth daily.    Dispense:  90 tablet    Refill:  1    Order Specific Question:   Supervising Provider    Answer:   Lucille Passy [[id number]]  . metoprolol succinate (TOPROL-XL) 50 MG 24 hr tablet    Sig: Take 1 tablet (50 mg total) by mouth daily. Take with or immediately following a meal.    Dispense:  90 tablet    Refill:  1    Order Specific Question:   Supervising Provider    Answer:   Lucille Passy [[id number]]  . lisinopril-hydrochlorothiazide (PRINZIDE,ZESTORETIC) 10-12.5 MG tablet    Sig: Take 1 tablet by mouth daily.    Dispense:  90 tablet    Refill:  1    Order Specific Question:   Supervising Provider    Answer:   Lucille Passy [[id number]]  . levothyroxine (SYNTHROID, LEVOTHROID) 50 MCG tablet    Sig: Take 1 tablet (50 mcg total) by mouth daily before breakfast.    Dispense:  90 tablet    Refill:  1    Order Specific Question:   Supervising Provider    Answer:   Lucille Passy [[id number]]    Follow-up: No Follow-up on file.  Wilfred Lacy, NP

## 2017-08-12 ENCOUNTER — Telehealth: Payer: Self-pay | Admitting: *Deleted

## 2017-08-12 NOTE — Telephone Encounter (Signed)
"  I'm trying to schedule a surgery date for this year.  Give me a call back.  I work from 44 - 5 at [employer]."

## 2017-08-12 NOTE — Telephone Encounter (Signed)
Called pt and left her a voicemail letting her know that Delydia is out this afternoon and I'm helping her out. I told her we received her messages and that Delydia would be in touch with her tomorrow via phone or MyChart to let her know what the next steps would be.

## 2017-08-12 NOTE — Telephone Encounter (Signed)
Hey this is Abbe Bula calling you back again to schedule surgery later. You can call me back at [phone] and leave a message or I'm on MyChart and you can send me message that way.

## 2017-08-12 NOTE — Telephone Encounter (Signed)
Hey. I was giving you a call to schedule an upcoming surgery with Dr. Milinda Pointer. Please give me a call at [phone] to schedule my surgery this spring with Dr. Milinda Pointer. You can reach me this number after hours because I work the same hours as you from 8-5 at [employer]. I will try giving you a call back.

## 2017-08-13 ENCOUNTER — Telehealth: Payer: Self-pay | Admitting: *Deleted

## 2017-08-13 NOTE — Telephone Encounter (Signed)
"  I'm calling to schedule my surgery.  I saw Dr. Milinda Pointer about a year ago."  You need to schedule an appointment with Dr. Milinda Pointer for a consultation.  "I told that girl that and she sent me to you anyway."  I'm sorry, I can send you to a scheduler if you like.  "That will be good, thank you.  I'll speak with you when I come in for my appointment."

## 2017-08-23 MED FILL — LEVOTHYROXINE 50 MCG TABLET: 50 | 90 days supply | Qty: 90 | Fill #0

## 2017-08-30 ENCOUNTER — Ambulatory Visit (INDEPENDENT_AMBULATORY_CARE_PROVIDER_SITE_OTHER): Payer: 59

## 2017-08-30 ENCOUNTER — Ambulatory Visit (INDEPENDENT_AMBULATORY_CARE_PROVIDER_SITE_OTHER): Payer: 59 | Admitting: Podiatry

## 2017-08-30 ENCOUNTER — Encounter: Payer: Self-pay | Admitting: Podiatry

## 2017-08-30 DIAGNOSIS — T84498A Other mechanical complication of other internal orthopedic devices, implants and grafts, initial encounter: Secondary | ICD-10-CM | POA: Diagnosis not present

## 2017-08-30 DIAGNOSIS — M779 Enthesopathy, unspecified: Secondary | ICD-10-CM

## 2017-08-30 DIAGNOSIS — M205X1 Other deformities of toe(s) (acquired), right foot: Secondary | ICD-10-CM

## 2017-08-30 DIAGNOSIS — M79676 Pain in unspecified toe(s): Secondary | ICD-10-CM

## 2017-08-30 NOTE — Patient Instructions (Signed)

## 2017-09-04 NOTE — Progress Notes (Signed)
Subjective: 57 year old female presents the office today for continued pain to the right foot.  She presents today for surgical consultation.  She states that she has quite a bit of pain as well as swelling on the first big toe joint.  This is been getting worse over the last year since I saw her this point he wishes to proceed with surgical intervention given the ongoing pain and swelling.  She denies any recent injury or trauma.  She has no other concerns today. Denies any systemic complaints such as fevers, chills, nausea, vomiting. No acute changes since last appointment, and no other complaints at this time.   Objective: AAO x3, NAD DP/PT pulses palpable bilaterally, CRT less than 3 seconds There is decreased range of motion of the right first MTPJ and there is pain with MPJ range of motion.  There is chronic swelling along the joint and there is no erythema or increase in warmth.  There is no other area tenderness identified at this time. No open lesions or pre-ulcerative lesions.  No pain with calf compression, swelling, warmth, erythema  Assessment: Hallux limitus due to failed implant right first MTPJ  Plan: -All treatment options discussed with the patient including all alternatives, risks, complications.  -X-rays were obtained and reviewed.  Arthritic changes present the first MTPJ and there is failed implant within the joint.  At this time we discussed both conservative as well as surgical treatment options.  She wishes to proceed with surgical intervention. -We discussed removal of hardware with first MTPJ arthrodesis with additive implant.  We also discussed utilizing autogenous bone but after discussion we are going to use the Additive impla we discussed the surgery as well as the postoperative course.nt.  -The incision placement as well as the postoperative course was discussed with the patient. I discussed risks of the surgery which include, but not limited to, infection, bleeding,  pain, swelling, need for further surgery, delayed or nonhealing, painful or ugly scar, numbness or sensation changes, over/under correction, recurrence, transfer lesions, further deformity, hardware failure, DVT/PE, loss of toe/foot. Patient understands these risks and wishes to proceed with surgery. The surgical consent was reviewed with the patient all 3 pages were signed. No promises or guarantees were given to the outcome of the procedure. All questions were answered to the best of my ability. Before the surgery the patient was encouraged to call the office if there is any further questions. The surgery will be performed at the [facility] on an outpatient basis. -Patient encouraged to call the office with any questions, concerns, change in symptoms.   Trula Slade DPM

## 2017-09-13 DIAGNOSIS — M79676 Pain in unspecified toe(s): Secondary | ICD-10-CM

## 2017-09-27 MED FILL — LISINOPRIL-HCTZ 10-12.5 MG: 10-12.5 | 90 days supply | Qty: 90 | Fill #0

## 2017-09-27 MED FILL — METOPROLOL SUCC ER 50 MG TA: 50 | 90 days supply | Qty: 90 | Fill #0

## 2017-10-07 ENCOUNTER — Telehealth: Payer: Self-pay | Admitting: *Deleted

## 2017-10-07 NOTE — Telephone Encounter (Signed)
"  I'm scheduled for surgery on the 10th.  I am calling to get my arrival time.  I was told my surgery would be at 1 pm by the doctor."  Someone from the surgical center will call you with the arrival time a day or two prior to your surgery date.  "Okay, I'll just wait on their call."

## 2017-10-09 ENCOUNTER — Other Ambulatory Visit: Payer: Self-pay | Admitting: Podiatry

## 2017-10-09 ENCOUNTER — Encounter: Payer: Self-pay | Admitting: Podiatry

## 2017-10-09 DIAGNOSIS — M2021 Hallux rigidus, right foot: Secondary | ICD-10-CM | POA: Diagnosis not present

## 2017-10-09 DIAGNOSIS — Z96698 Presence of other orthopedic joint implants: Secondary | ICD-10-CM | POA: Diagnosis not present

## 2017-10-09 DIAGNOSIS — I10 Essential (primary) hypertension: Secondary | ICD-10-CM | POA: Diagnosis not present

## 2017-10-09 DIAGNOSIS — M25571 Pain in right ankle and joints of right foot: Secondary | ICD-10-CM | POA: Diagnosis not present

## 2017-10-09 DIAGNOSIS — Z4889 Encounter for other specified surgical aftercare: Secondary | ICD-10-CM | POA: Diagnosis not present

## 2017-10-09 DIAGNOSIS — Z981 Arthrodesis status: Secondary | ICD-10-CM

## 2017-10-09 DIAGNOSIS — Z472 Encounter for removal of internal fixation device: Secondary | ICD-10-CM | POA: Diagnosis not present

## 2017-10-09 MED FILL — PROMETHAZINE 25 MG TABLET: 25 | 6 days supply | Qty: 20 | Fill #0

## 2017-10-09 MED FILL — CLINDAMYCIN HCL 150 MG CAPS: 150 | 7 days supply | Qty: 21 | Fill #0

## 2017-10-09 MED FILL — OXYCODONE-ACETAMINOPHEN 5-3: 5-325 | 3 days supply | Qty: 25 | Fill #0

## 2017-10-09 NOTE — Progress Notes (Signed)
Knee scooter ordered [place] contacted

## 2017-10-10 ENCOUNTER — Telehealth: Payer: Self-pay | Admitting: Podiatry

## 2017-10-10 NOTE — Telephone Encounter (Signed)
I called the patient to see how she was doing. She states that she is doing "really good". She started pain medication this morning and has been taking regularly and her pain is controlled. She has been staying off of her foot and keeping it iced and elevated. She has no other concerns and she is doing well. She denies any fevers, chills, nausea, vomiting. No chest pain, calf pain, SOB. Encouraged to call with any questions or concerns.   Shelly Cohen

## 2017-10-11 ENCOUNTER — Telehealth: Payer: Self-pay | Admitting: Podiatry

## 2017-10-11 NOTE — Telephone Encounter (Signed)
I called the pt back to let her know that it was the automated system calling to remind her of her first post-op visit appointment with Dr. Jacqualyn Posey on Monday at 9:15 am.

## 2017-10-11 NOTE — Telephone Encounter (Signed)
Someone had called me and I missed the call. Please call me back at [phone]. Thank you.

## 2017-10-14 ENCOUNTER — Ambulatory Visit (INDEPENDENT_AMBULATORY_CARE_PROVIDER_SITE_OTHER): Payer: 59

## 2017-10-14 ENCOUNTER — Ambulatory Visit (INDEPENDENT_AMBULATORY_CARE_PROVIDER_SITE_OTHER): Payer: 59 | Admitting: Podiatry

## 2017-10-14 DIAGNOSIS — Z981 Arthrodesis status: Secondary | ICD-10-CM

## 2017-10-14 DIAGNOSIS — M2021 Hallux rigidus, right foot: Secondary | ICD-10-CM

## 2017-10-15 ENCOUNTER — Telehealth: Payer: Self-pay | Admitting: Podiatry

## 2017-10-15 ENCOUNTER — Encounter: Payer: Self-pay | Admitting: Podiatry

## 2017-10-15 ENCOUNTER — Other Ambulatory Visit: Payer: Self-pay | Admitting: Podiatry

## 2017-10-15 DIAGNOSIS — Z9889 Other specified postprocedural states: Secondary | ICD-10-CM

## 2017-10-15 MED ORDER — OXYCODONE-ACETAMINOPHEN 5-325 MG PO TABS: 1.0000 | ORAL_TABLET | Freq: Four times a day (QID) | ORAL | 0 refills | Status: DC | PRN

## 2017-10-15 MED FILL — OXYCODONE-ACETAMINOPHEN 5-3: 5-325 | 5 days supply | Qty: 20 | Fill #0

## 2017-10-15 NOTE — Telephone Encounter (Signed)
-----   Message from Marlou Sa sent at 10/14/2017  9:42 AM EDT ----- Regarding: FYI--Knee Scooter Per patient, she does not want to have knee scooter ordered for her. Apparently it was mentioned at this visit that she would be getting once, but patient declined scooter.

## 2017-10-15 NOTE — Telephone Encounter (Signed)
I'm a pt of Dr. Leigh Aurora. I was calling to see if I can get a refill or new prescription for pain medication. I had a rough night last night without any pain medication. Advil did not work with the pain. Please give me a call back so I can get some medication called in for the pain. Thanks. Have a good day.

## 2017-10-15 NOTE — Telephone Encounter (Signed)
done

## 2017-10-15 NOTE — Telephone Encounter (Addendum)
Pt states she has run out of the oxycodone 5/3mg  1-2 tablets every 6 hours. Pt states she is taking 2 tablets every 6 hours. I told pt I would get Dr. Jacqualyn Posey to send to [facility].

## 2017-10-15 NOTE — Progress Notes (Signed)
Refilled percocet

## 2017-10-15 NOTE — Addendum Note (Signed)
Addended by: Harriett Sine D on: 10/15/2017 02:39 PM   Modules accepted: Orders

## 2017-10-15 NOTE — Progress Notes (Signed)
Subjective: Shelly Cohen is a 57 y.o. is seen today in office s/p right first MPJ implant removal with spacer MPJ arthrodesis performed on 10/09/2017.  She states that her pain is improving she is only taking over-the-counter medication at this point.  She has been walking up only minimal pressure on the area which is been using a walker.  She is still within the knee scooter.  Denies any systemic complaints such as fevers, chills, nausea, vomiting. No calf pain, chest pain, shortness of breath.   Objective: General: No acute distress, AAOx3  DP/PT pulses palpable 2/4, CRT < 3 sec to all digits.  Protective sensation intact. Motor function intact.  RIGHT foot: Incision is well coapted without any evidence of dehiscence and sutures are intact. There is no surrounding erythema, ascending cellulitis, fluctuance, crepitus, malodor, drainage/purulence. There is mild edema around the surgical site. There is mild pain along the surgical site.  Toes and rectus position. No other areas of tenderness to bilateral lower extremities.  No other open lesions or pre-ulcerative lesions.  No pain with calf compression, swelling, warmth, erythema.   Assessment and Plan:  Status post right first MPJ arthrodesis, doing well with no complications   -Treatment options discussed including all alternatives, risks, and complications -X-rays were obtained and reviewed.  Status post first MPJ arthrodesis with implant present.  No evidence of acute fracture or otherwise. -Antibiotic ointment was applied followed by a bandage.  Keep the dressing clean, dry, intact. -Urged nonweightbearing.  We will follow-up on the knee scooter. -Ice/elevation -Pain medication as needed. -Monitor for any clinical signs or symptoms of infection and DVT/PE and directed to call the office immediately should any occur or go to the ER. -Follow-up 10 days for possible suture removal or sooner if any problems arise. In the meantime,  encouraged to call the office with any questions, concerns, change in symptoms.   Celesta Gentile, DPM

## 2017-10-15 NOTE — Telephone Encounter (Signed)
Noted pt does not want.

## 2017-10-16 DIAGNOSIS — M205X1 Other deformities of toe(s) (acquired), right foot: Secondary | ICD-10-CM | POA: Diagnosis not present

## 2017-10-16 DIAGNOSIS — T84498A Other mechanical complication of other internal orthopedic devices, implants and grafts, initial encounter: Secondary | ICD-10-CM | POA: Diagnosis not present

## 2017-10-22 ENCOUNTER — Encounter: Payer: Self-pay | Admitting: Podiatry

## 2017-10-22 ENCOUNTER — Ambulatory Visit (INDEPENDENT_AMBULATORY_CARE_PROVIDER_SITE_OTHER): Payer: 59 | Admitting: Podiatry

## 2017-10-22 DIAGNOSIS — M2021 Hallux rigidus, right foot: Secondary | ICD-10-CM

## 2017-10-22 DIAGNOSIS — Z9889 Other specified postprocedural states: Secondary | ICD-10-CM

## 2017-10-23 NOTE — Progress Notes (Signed)
Subjective: Ellakate Gonsalves is a 57 y.o. is seen today in office s/p right first MPJ implant removal with spacer MPJ arthrodesis performed on 10/09/2017.  She states that she is doing well.  She is only taking over-the-counter Aleve for pain.  She denies any recent injury or trauma.  She been trying to have her foot as much as possible.  She is been trying ice and elevate.  She declined a knee scooter.  Denies any systemic complaints such as fevers, chills, nausea, vomiting. No calf pain, chest pain, shortness of breath.   Objective: General: No acute distress, AAOx3  DP/PT pulses palpable 2/4, CRT < 3 sec to all digits.  Protective sensation intact. Motor function intact.  RIGHT foot: Incision is well coapted without any evidence of dehiscence and sutures are intact.  There is mild motion across the incision however it appears to be healing well.  There is no surrounding erythema, ascending cellulitis.  There is no fluctuation or crepitation or malodor.  No clinical signs of infection.  Mild tenderness to palpation of the surgical site but no other areas of tenderness. No other open lesions or pre-ulcerative lesions.  No pain with calf compression, swelling, warmth, erythema.   Assessment and Plan:  Status post right first MPJ arthrodesis, doing well with no complications   -Treatment options discussed including all alternatives, risks, and complications -Incision appears to be healing well however I did leave the sutures intact today.  Antibiotic ointment was applied followed by a bandage.  Keep the dressing clean, dry, intact.  Continue to ice and elevate as well as remain nonweightbearing in the cam boot at all times. -Pain medication as needed -Monitor for any clinical signs or symptoms of infection and directed to call the office immediately should any occur or go to the ER. -Follow-up in 1 week for suture removal or sooner if needed.  Trula Slade DPM

## 2017-11-01 ENCOUNTER — Encounter: Payer: Self-pay | Admitting: Podiatry

## 2017-11-01 ENCOUNTER — Ambulatory Visit (INDEPENDENT_AMBULATORY_CARE_PROVIDER_SITE_OTHER): Payer: 59 | Admitting: Podiatry

## 2017-11-01 DIAGNOSIS — M2021 Hallux rigidus, right foot: Secondary | ICD-10-CM

## 2017-11-01 DIAGNOSIS — Z981 Arthrodesis status: Secondary | ICD-10-CM

## 2017-11-03 NOTE — Progress Notes (Signed)
Subjective: Shelly Cohen is a 57 y.o. is seen today in office s/p right first MPJ implant removal with spacer MPJ arthrodesis performed on 10/09/2017.  She states that overall she is doing better she is not taking any pain medication.  She states that overall she has an occasional sharp pain but overall this is getting better as well.  She denies any recent injury or fall.  She is been trying to have her foot and she presents today utilizing her knee scooter and Cam boot.  Denies any systemic complaints such as fevers, chills, nausea, vomiting. No calf pain, chest pain, shortness of breath.   Objective: General: No acute distress, AAOx3  DP/PT pulses palpable 2/4, CRT < 3 sec to all digits.  Protective sensation intact. Motor function intact.  RIGHT foot: Incision is well coapted without any evidence of dehiscence and sutures are intact.  Incision appears to be healing very well.  There is no surrounding erythema, ascending sialitis.  There is no drainage or pus or any clinical signs of infection noted today.  Minimal tenderness palpation surgical site. No other open lesions or pre-ulcerative lesions.  No pain with calf compression, swelling, warmth, erythema.   Assessment and Plan:  Status post right first MPJ arthrodesis, doing well with no complications   -Treatment options discussed including all alternatives, risks, and complications -Incision is healing well.  Sutures removed today without any complications. Antibiotic ointment was applied followed by a bandage.  In 2 days she can remove the bandage and start to shower.  Afterwards dry thoroughly and apply antibiotic ointment and a bandage.  Continue with cam boot and nonweightbearing.  Continue to ice and elevate.  Pain medication as needed although she has not been needing this. -Monitor for any clinical signs or symptoms of infection/DVT and directed to call the office immediately should any occur or go to the ER. -Follow-up in 1 week  or sooner if needed.  *We will extend her FMLA for about 2 more weeks.  Trula Slade DPM

## 2017-11-04 ENCOUNTER — Other Ambulatory Visit: Payer: Self-pay | Admitting: Nurse Practitioner

## 2017-11-04 DIAGNOSIS — Z1231 Encounter for screening mammogram for malignant neoplasm of breast: Secondary | ICD-10-CM

## 2017-11-05 ENCOUNTER — Ambulatory Visit
Admission: RE | Admit: 2017-11-05 | Discharge: 2017-11-05 | Disposition: A | Discharge status: Home / Self Care | Payer: 59 | Source: Ambulatory Visit | Attending: Nurse Practitioner | Admitting: Nurse Practitioner

## 2017-11-05 ENCOUNTER — Encounter: Payer: Self-pay | Admitting: Podiatry

## 2017-11-05 DIAGNOSIS — Z1231 Encounter for screening mammogram for malignant neoplasm of breast: Secondary | ICD-10-CM

## 2017-11-07 DIAGNOSIS — H52223 Regular astigmatism, bilateral: Secondary | ICD-10-CM | POA: Diagnosis not present

## 2017-11-07 DIAGNOSIS — H5213 Myopia, bilateral: Secondary | ICD-10-CM | POA: Diagnosis not present

## 2017-11-07 DIAGNOSIS — H524 Presbyopia: Secondary | ICD-10-CM | POA: Diagnosis not present

## 2017-11-08 ENCOUNTER — Encounter: Payer: Self-pay | Admitting: Nurse Practitioner

## 2017-11-08 ENCOUNTER — Encounter: Payer: 59 | Admitting: Nurse Practitioner

## 2017-11-08 ENCOUNTER — Encounter: Payer: Self-pay | Admitting: Podiatry

## 2017-11-08 ENCOUNTER — Ambulatory Visit (INDEPENDENT_AMBULATORY_CARE_PROVIDER_SITE_OTHER): Payer: 59 | Admitting: Podiatry

## 2017-11-08 ENCOUNTER — Ambulatory Visit (INDEPENDENT_AMBULATORY_CARE_PROVIDER_SITE_OTHER): Payer: 59

## 2017-11-08 DIAGNOSIS — M2021 Hallux rigidus, right foot: Secondary | ICD-10-CM

## 2017-11-08 DIAGNOSIS — Z981 Arthrodesis status: Secondary | ICD-10-CM

## 2017-11-08 NOTE — Progress Notes (Signed)
Subjective: Shelly Cohen is a 57 y.o. is seen today in office s/p right first MPJ implant removal with spacer MPJ arthrodesis performed on 10/09/2017.  She states that she is doing well.  She still gets some intermittent sharp pain a couple times during the day which is been about the same.  She is not requiring any pain medication.  She does not take any medicine at all in the over-the-counter for the pain.  She is requesting to go back to work on Monday.  She has been nonweightbearing utilizing the cam boot. Denies any systemic complaints such as fevers, chills, nausea, vomiting. No calf pain, chest pain, shortness of breath.   Objective: General: No acute distress, AAOx3  DP/PT pulses palpable 2/4, CRT < 3 sec to all digits.  Protective sensation intact. Motor function intact.  RIGHT foot: Incision is well coapted without any evidence of dehiscence.  She has showered twice and there is some macerated tissue underneath the Steri-Strips.  There is no opening on the incision there is no drainage or pus any surrounding erythema, ascending cellulitis.  There is mild swelling to the area.  There is no significant tenderness palpation to the surgical site.  There is no other open lesions or pre-ulcerative lesions.  There is no other areas of tenderness.  There is no pain with calf compression, swelling, warmth, erythema.  No evidence of DVT. No pain with calf compression, swelling, warmth, erythema.   Assessment and Plan:  Status post right first MPJ arthrodesis, doing well with no complications   -Treatment options discussed including all alternatives, risks, and complications -X-rays were obtained and reviewed.  Hardware intact.  No evidence of acute fracture. -Small amount of Betadine was painted over the incision followed by dry sterile dressing.  We will continue with this daily as well due to the mild macerated tissue.  She can change the bandage twice a day if she needs. -Continue cam boot,  nonweightbearing.  Continue ice and elevation.  Discussed anti-inflammatories as needed for pain but she has not been requiring that she states. -She is requesting a blood work.  On her return to work on Monday but she has been nonweightbearing and utilize a knee scooter, cam boot to keep her foot elevated.  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 -RTC 2 weeks or sooner if needed  Trula Slade DPM

## 2017-11-11 NOTE — Progress Notes (Signed)
Not due for CPE. No acute complaint Patient decided to cancel appt and reschedule This encounter was created in error - please disregard.

## 2017-11-18 ENCOUNTER — Encounter: Payer: Self-pay | Admitting: Podiatry

## 2017-11-19 ENCOUNTER — Encounter: Payer: Self-pay | Admitting: Podiatry

## 2017-11-19 ENCOUNTER — Ambulatory Visit (INDEPENDENT_AMBULATORY_CARE_PROVIDER_SITE_OTHER): Payer: 59 | Admitting: Podiatry

## 2017-11-19 DIAGNOSIS — Z981 Arthrodesis status: Secondary | ICD-10-CM

## 2017-11-19 DIAGNOSIS — M2021 Hallux rigidus, right foot: Secondary | ICD-10-CM

## 2017-11-21 NOTE — Progress Notes (Signed)
Subjective: Shelly Cohen is a 57 y.o. is seen today in office s/p right first MPJ implant removal with spacer MPJ arthrodesis performed on 10/09/2017.  She states that overall she is doing well.  She has gone back to work and she is been using her knee scooter at work but she is internal walking the boot around the house.  Overall she states that she is doing well.  The sharp pain that she was having previously has been getting much better and has been dissipating.  She denies any recent injury or trauma she states the swelling has also improved.  Overall she states that she is doing well she is happy with the outcome so far. Denies any systemic complaints such as fevers, chills, nausea, vomiting. No calf pain, chest pain, shortness of breath.   Objective: General: No acute distress, AAOx3  DP/PT pulses palpable 2/4, CRT < 3 sec to all digits.  Protective sensation intact. Motor function intact.  RIGHT foot: Incision is well coapted without any evidence of dehiscence an eschar is well formed.  There is no tenderness palpation of the surgical site.  Arthrodesis site appears to be stable.  There is some mild swelling to the area but there is no erythema or increase in warmth.  She presents today wearing a cam boot utilizing her knee scooter.  There is no other areas of tenderness identified today.   No pain with calf compression, swelling, warmth, erythema.  No evidence of DVT.  Assessment and Plan:  Status post right first MPJ arthrodesis, doing well with no complications   -Treatment options discussed including all alternatives, risks, and complications -At this time on her start to transition to weightbearing in the cam boot as she is able.  Start physical therapy.  She will continue the knee scooter at work as she is able to will start to walk in the cam boot.  Continue to ice and elevate as well as compression anklet.  Pain medication as needed but she states that she has not been eating this.   I will see her back in 3 weeks and hopefully she will will start physical therapy in the near future.  Trula Slade DPM

## 2017-11-22 ENCOUNTER — Encounter: Payer: 59 | Admitting: Podiatry

## 2017-11-28 DIAGNOSIS — H401121 Primary open-angle glaucoma, left eye, mild stage: Secondary | ICD-10-CM | POA: Diagnosis not present

## 2017-11-28 MED FILL — LATANOPROST 0.005% EYE DRP: 0.005 | 30 days supply | Qty: 3 | Fill #0

## 2017-12-05 ENCOUNTER — Ambulatory Visit (INDEPENDENT_AMBULATORY_CARE_PROVIDER_SITE_OTHER): Payer: 59

## 2017-12-05 ENCOUNTER — Encounter: Payer: Self-pay | Admitting: Podiatry

## 2017-12-05 ENCOUNTER — Ambulatory Visit (INDEPENDENT_AMBULATORY_CARE_PROVIDER_SITE_OTHER): Payer: 59 | Admitting: Podiatry

## 2017-12-05 DIAGNOSIS — M2021 Hallux rigidus, right foot: Secondary | ICD-10-CM | POA: Diagnosis not present

## 2017-12-08 NOTE — Progress Notes (Signed)
Subjective: Shelly Cohen is a 57 y.o. is seen today in office s/p right first MPJ implant removal with spacer MPJ arthrodesis performed on 10/09/2017.  Overall she states that she is doing well.  She gets some discomfort after being on her feet a lot and she is wearing the cam boot.  She states the swelling is improved.  The sharp pain that she is getting is also been improving and getting better.  She denies any recent injury or falls. She states that she is still wearing this boot at work and using the scooter that is what I asked her to do but she does not feel that she needs to do this.  She has no other concerns today.   Objective: General: No acute distress, AAOx3  DP/PT pulses palpable 2/4, CRT < 3 sec to all digits.  Protective sensation intact. Motor function intact.  RIGHT foot: Incision is well coapted without any evidence of dehiscence a scar has well formed.  There is no tenderness palpation of the surgical site.  Arthrodesis site appears to be stable.  There is still some mild swelling however this appears to be improved there is no erythema or increase in warmth.   No pain with calf compression, swelling, warmth, erythema.  No evidence of DVT.  Assessment and Plan:  Status post right first MPJ arthrodesis, doing well with no complications   -Treatment options discussed including all alternatives, risks, and complications -She is doing well.  She is using the boot at all times.Have her continue wearing the boot however she can start to be completely weightbearing in the cam boot even while at work.  Note was provided for this.  Also will start physical therapy and the prescription was written for physical therapy today for benchmark.  Continue to ice and elevate.  She is not taking any pain medication. -RTC 2-3 weeks or sooner if needed  Trula Slade DPM

## 2017-12-11 NOTE — Addendum Note (Signed)
Addended by: Harriett Sine D on: 12/11/2017 04:21 PM   Modules accepted: Orders

## 2017-12-17 MED FILL — LISINOPRIL-HCTZ 10-12.5 MG: 10-12.5 | 90 days supply | Qty: 90 | Fill #1

## 2017-12-17 MED FILL — POTASSIUM CL ER 10 MEQ TABL: 10 | 90 days supply | Qty: 90 | Fill #0

## 2017-12-19 ENCOUNTER — Ambulatory Visit (INDEPENDENT_AMBULATORY_CARE_PROVIDER_SITE_OTHER): Payer: 59

## 2017-12-19 ENCOUNTER — Ambulatory Visit (INDEPENDENT_AMBULATORY_CARE_PROVIDER_SITE_OTHER): Payer: 59 | Admitting: Podiatry

## 2017-12-19 ENCOUNTER — Encounter: Payer: Self-pay | Admitting: Podiatry

## 2017-12-19 DIAGNOSIS — M2021 Hallux rigidus, right foot: Secondary | ICD-10-CM | POA: Diagnosis not present

## 2017-12-19 DIAGNOSIS — Z981 Arthrodesis status: Secondary | ICD-10-CM

## 2017-12-19 MED FILL — LEVOTHYROXINE 50 MCG TABLET: 50 | 90 days supply | Qty: 90 | Fill #1

## 2017-12-20 ENCOUNTER — Encounter: Payer: Self-pay | Admitting: Podiatry

## 2017-12-20 DIAGNOSIS — M25474 Effusion, right foot: Secondary | ICD-10-CM | POA: Diagnosis not present

## 2017-12-20 DIAGNOSIS — M25674 Stiffness of right foot, not elsewhere classified: Secondary | ICD-10-CM | POA: Diagnosis not present

## 2017-12-20 DIAGNOSIS — R269 Unspecified abnormalities of gait and mobility: Secondary | ICD-10-CM | POA: Diagnosis not present

## 2017-12-20 DIAGNOSIS — M25571 Pain in right ankle and joints of right foot: Secondary | ICD-10-CM | POA: Diagnosis not present

## 2017-12-20 NOTE — Progress Notes (Signed)
Subjective: Shelly Cohen is a 57 y.o. is seen today in office s/p right first MPJ implant removal with spacer MPJ arthrodesis performed on 10/09/2017.  Overall she states that she is doing well.  She states that she has been able to wear a bedroom slipper around the house without any pain.  She does notice when she does a lot of walking showed a little bit of soreness to her foot but overall she states that she is doing well and that she is very happy that she did the surgery so far.  She denies any recent injury or trauma.  She is scheduled to start physical therapy tomorrow.  Objective: General: No acute distress, AAOx3 -presents today walking in the cam boot. DP/PT pulses palpable 2/4, CRT < 3 sec to all digits.  Protective sensation intact. Motor function intact.  RIGHT foot: Incision is well coapted without any evidence of dehiscence a scar has well formed.  There is no significant edema to the foot there is no erythema or increase in warmth there is no clinical signs of infection.  There is no tenderness palpation of the surgical site and the arthrodesis site appears to be stable.  There is no tenderness palpation of the surgical site.  Arthrodesis site appears to be stable.  There is still some mild swelling however this appears to be improved there is no erythema or increase in warmth.   No pain with calf compression, swelling, warmth, erythema.  No evidence of DVT.  Assessment and Plan:  Status post right first MPJ arthrodesis, doing well with no complications   -Treatment options discussed including all alternatives, risks, and complications -X-rays obtained and reviewed.  Hardware intact without any evidence of fracture or hardware breakage. -She can continue to wear the cam boot.  Will start physical therapy tomorrow.  As she is able to work and start to transition to regular shoe.  Make this a gradual transition gradually increase activity level.  I think it is encouraging that she  wear a regular bedroom slipper around the house without any discomfort.  Only continue ice elevate and gradual increase activity level.  Trula Slade DPM

## 2017-12-24 MED FILL — SULFAMETHOXAZOLE-TMP DS TAB: 800-160 | 14 days supply | Qty: 28 | Fill #0

## 2017-12-25 DIAGNOSIS — R269 Unspecified abnormalities of gait and mobility: Secondary | ICD-10-CM | POA: Diagnosis not present

## 2017-12-25 DIAGNOSIS — M25474 Effusion, right foot: Secondary | ICD-10-CM | POA: Diagnosis not present

## 2017-12-25 DIAGNOSIS — M25674 Stiffness of right foot, not elsewhere classified: Secondary | ICD-10-CM | POA: Diagnosis not present

## 2017-12-25 DIAGNOSIS — M25571 Pain in right ankle and joints of right foot: Secondary | ICD-10-CM | POA: Diagnosis not present

## 2017-12-25 DIAGNOSIS — H401121 Primary open-angle glaucoma, left eye, mild stage: Secondary | ICD-10-CM | POA: Diagnosis not present

## 2017-12-30 DIAGNOSIS — M25571 Pain in right ankle and joints of right foot: Secondary | ICD-10-CM | POA: Diagnosis not present

## 2017-12-30 DIAGNOSIS — M25674 Stiffness of right foot, not elsewhere classified: Secondary | ICD-10-CM | POA: Diagnosis not present

## 2017-12-30 DIAGNOSIS — R269 Unspecified abnormalities of gait and mobility: Secondary | ICD-10-CM | POA: Diagnosis not present

## 2017-12-30 DIAGNOSIS — M25474 Effusion, right foot: Secondary | ICD-10-CM | POA: Diagnosis not present

## 2018-01-01 DIAGNOSIS — M25474 Effusion, right foot: Secondary | ICD-10-CM | POA: Diagnosis not present

## 2018-01-01 DIAGNOSIS — M25674 Stiffness of right foot, not elsewhere classified: Secondary | ICD-10-CM | POA: Diagnosis not present

## 2018-01-01 DIAGNOSIS — M25571 Pain in right ankle and joints of right foot: Secondary | ICD-10-CM | POA: Diagnosis not present

## 2018-01-01 DIAGNOSIS — R269 Unspecified abnormalities of gait and mobility: Secondary | ICD-10-CM | POA: Diagnosis not present

## 2018-01-07 DIAGNOSIS — M25474 Effusion, right foot: Secondary | ICD-10-CM | POA: Diagnosis not present

## 2018-01-07 DIAGNOSIS — M25674 Stiffness of right foot, not elsewhere classified: Secondary | ICD-10-CM | POA: Diagnosis not present

## 2018-01-07 DIAGNOSIS — R269 Unspecified abnormalities of gait and mobility: Secondary | ICD-10-CM | POA: Diagnosis not present

## 2018-01-07 DIAGNOSIS — M25571 Pain in right ankle and joints of right foot: Secondary | ICD-10-CM | POA: Diagnosis not present

## 2018-01-08 ENCOUNTER — Ambulatory Visit (INDEPENDENT_AMBULATORY_CARE_PROVIDER_SITE_OTHER): Payer: 59 | Admitting: Nurse Practitioner

## 2018-01-08 ENCOUNTER — Encounter: Payer: Self-pay | Admitting: Nurse Practitioner

## 2018-01-08 VITALS — BP 124/70 | HR 78 | Temp 97.9°F | Ht 63.0 in | Wt 213.0 lb

## 2018-01-08 DIAGNOSIS — M25674 Stiffness of right foot, not elsewhere classified: Secondary | ICD-10-CM | POA: Diagnosis not present

## 2018-01-08 DIAGNOSIS — R269 Unspecified abnormalities of gait and mobility: Secondary | ICD-10-CM | POA: Diagnosis not present

## 2018-01-08 DIAGNOSIS — Z136 Encounter for screening for cardiovascular disorders: Secondary | ICD-10-CM | POA: Diagnosis not present

## 2018-01-08 DIAGNOSIS — M25474 Effusion, right foot: Secondary | ICD-10-CM | POA: Diagnosis not present

## 2018-01-08 DIAGNOSIS — Z0001 Encounter for general adult medical examination with abnormal findings: Secondary | ICD-10-CM | POA: Diagnosis not present

## 2018-01-08 DIAGNOSIS — H401190 Primary open-angle glaucoma, unspecified eye, stage unspecified: Secondary | ICD-10-CM | POA: Insufficient documentation

## 2018-01-08 DIAGNOSIS — M25571 Pain in right ankle and joints of right foot: Secondary | ICD-10-CM | POA: Diagnosis not present

## 2018-01-08 DIAGNOSIS — I10 Essential (primary) hypertension: Secondary | ICD-10-CM

## 2018-01-08 DIAGNOSIS — D509 Iron deficiency anemia, unspecified: Secondary | ICD-10-CM | POA: Diagnosis not present

## 2018-01-08 DIAGNOSIS — Z13220 Encounter for screening for lipoid disorders: Secondary | ICD-10-CM | POA: Diagnosis not present

## 2018-01-08 DIAGNOSIS — E039 Hypothyroidism, unspecified: Secondary | ICD-10-CM | POA: Diagnosis not present

## 2018-01-08 DIAGNOSIS — M25551 Pain in right hip: Secondary | ICD-10-CM | POA: Diagnosis not present

## 2018-01-08 LAB — COMPREHENSIVE METABOLIC PANEL
ALT: 13 U/L (ref 0–35)
AST: 15 U/L (ref 0–37)
Albumin: 4 g/dL (ref 3.5–5.2)
Alkaline Phosphatase: 83 U/L (ref 39–117)
BILIRUBIN TOTAL: 0.4 mg/dL (ref 0.2–1.2)
BUN: 10 mg/dL (ref 6–23)
CO2: 32 meq/L (ref 19–32)
CREATININE: 0.81 mg/dL (ref 0.40–1.20)
Calcium: 9.7 mg/dL (ref 8.4–10.5)
Chloride: 102 mEq/L (ref 96–112)
GFR: 93.87 mL/min (ref >60.00)
GLUCOSE: 117 mg/dL — AB (ref 70–99)
Potassium: 3.3 mEq/L — ABNORMAL LOW (ref 3.5–5.1)
Sodium: 143 mEq/L (ref 135–145)
TOTAL PROTEIN: 7.9 g/dL (ref 6.0–8.3)

## 2018-01-08 LAB — CBC
HCT: 35.7 % — ABNORMAL LOW (ref 36.0–46.0)
Hemoglobin: 11.3 g/dL — ABNORMAL LOW (ref 12.0–15.0)
MCHC: 31.7 g/dL (ref 30.0–36.0)
MCV: 78.2 fl (ref 78.0–100.0)
PLATELETS: 332 10*3/uL (ref 150.0–400.0)
RBC: 4.56 Mil/uL (ref 3.87–5.11)
RDW: 17.4 % — ABNORMAL HIGH (ref 11.5–15.5)
WBC: 7.6 10*3/uL (ref 4.0–10.5)

## 2018-01-08 LAB — LIPID PANEL
Cholesterol: 167 mg/dL (ref 0–200)
HDL: 55.8 mg/dL (ref >39.00)
LDL CALC: 95 mg/dL (ref 0–99)
NONHDL: 111.15
Total CHOL/HDL Ratio: 3
Triglycerides: 80 mg/dL (ref 0.0–149.0)
VLDL: 16 mg/dL (ref 0.0–40.0)

## 2018-01-08 LAB — T4, FREE: FREE T4: 0.93 ng/dL (ref 0.60–1.60)

## 2018-01-08 LAB — TSH: TSH: 2.61 u[IU]/mL (ref 0.35–4.50)

## 2018-01-08 MED ORDER — METOPROLOL SUCCINATE ER 50 MG PO TB24: 50.0000 mg | ORAL_TABLET | Freq: Every day | ORAL | 3 refills | Status: DC

## 2018-01-08 MED ORDER — DICLOFENAC SODIUM 2 % TD SOLN: 1.0000 | Freq: Two times a day (BID) | TRANSDERMAL | 1 refills | Status: DC | PRN

## 2018-01-08 MED ORDER — LIDOCAINE 5 % EX PTCH: 1.0000 | MEDICATED_PATCH | CUTANEOUS | 1 refills | Status: DC

## 2018-01-08 MED ORDER — LEVOTHYROXINE SODIUM 50 MCG PO TABS: 50.0000 ug | ORAL_TABLET | Freq: Every day | ORAL | 3 refills | Status: DC

## 2018-01-08 MED ORDER — LISINOPRIL 10 MG PO TABS: 10.0000 mg | ORAL_TABLET | Freq: Every day | ORAL | 1 refills | Status: DC

## 2018-01-08 NOTE — Progress Notes (Signed)
Subjective:    Patient ID: Shelly Cohen, female    DOB: 09-30-1960, 57 y.o.   MRN: [id number]  Patient presents today for complete physical  And medication refill.  Hip Pain   Incident onset: onset after use of right foot surgical boot. There was no injury mechanism. The pain is present in the right hip. The quality of the pain is described as aching. The pain is moderate. The pain has been constant since onset. Pertinent negatives include no inability to bear weight, loss of motion, loss of sensation, muscle weakness, numbness or tingling. She reports no foreign bodies present. The symptoms are aggravated by weight bearing, palpation and movement. She has tried ice for the symptoms.  ongoing physical therapy.  HTN: Controlled with lisinopril/HCTZ. BP Readings from Last 3 Encounters:  01/08/18 124/70  08/02/17 134/72  06/14/17 (!) 170/84   Hypothyroidism: stable with current dose of levothyroxine.  Immunizations: (TDAP, Hep C screen, Pneumovax, Influenza, zoster)  Health Maintenance  Topic Date Due  . Flu Shot  01/30/2018  . Mammogram  11/06/2019  . Colon Cancer Screening  09/20/2022  . Tetanus Vaccine  12/14/2025  .  Hepatitis C: One time screening is recommended by Center for Disease Control  (CDC) for  adults born from [birth year] through 1965.   Completed  . HIV Screening  Completed  . Pap Smear  Discontinued   Diet:regular.  Weight:  Wt Readings from Last 3 Encounters:  01/08/18 213 lb (96.6 kg)  08/02/17 212 lb (96.2 kg)  06/14/17 212 lb (96.2 kg)    Exercise:unable to exercise at this time due to recent foot surgery.  Fall Risk: Fall Risk  01/08/2018 06/14/2017 10/19/2016 10/05/2016 05/18/2016  Falls in the past year? No Yes No No No  Number falls in past yr: - 1 - - -  Injury with Fall? - Yes - - -  Comment - hit left side face - - -   Home Safety:home with husband.  Depression/Suicide: Depression screen [facility] 2/9 01/08/2018 06/14/2017 10/19/2016 10/05/2016  05/18/2016  Decreased Interest 0 0 0 0 0  Down, Depressed, Hopeless 0 0 0 0 0  PHQ - 2 Score 0 0 0 0 0   Vision: up to date, diagnosed with glaucoma (L>R), use of eye drops, f/up in 6weeks.  Dental: Upcoming appt in 2weeks.  Advanced Directive: Advanced Directives 10/19/2016  Does Patient Have a Medical Advance Directive? No  Would patient like information on creating a medical advance directive? Yes (MAU/Ambulatory/Procedural Areas - Information given)    Medications and allergies reviewed with patient and updated if appropriate.  Patient Active Problem List   Diagnosis Date Noted  . Glaucoma 01/08/2018  . Mass of breast, left 06/29/2016  . Thalassemia trait, alpha 06/29/2016  . Iron deficiency anemia 06/29/2016  . S/P bunionectomy 06/29/2016  . Great toe pain, right 06/29/2016  . Encounter for weight loss counseling 06/29/2016  . HTN (hypertension), benign 05/18/2016  . Hypothyroidism 05/18/2016    Current Outpatient Medications on File Prior to Visit  Medication Sig Dispense Refill  . aspirin EC 81 MG tablet Take by mouth.    . ferrous sulfate 325 (65 FE) MG tablet Take by mouth.    . latanoprost (XALATAN) 0.005 % ophthalmic solution   5  . Multiple Vitamin (MULTI-VITAMINS) TABS Take by mouth.    . Omega-3 Fatty Acids (FISH OIL PO) Take by mouth.    . Polyethylene Glycol 3350 (MIRALAX PO) Take by mouth.    . promethazine (  PHENERGAN) 25 MG tablet   0   No current facility-administered medications on file prior to visit.     Past Medical History:  Diagnosis Date  . Alpha thalassemia trait   . Fibroadenoma   . Glaucoma    monitored every 49months  . History of positive PPD, treatment status unknown   . Hypertension   . Thyroid disease     Past Surgical History:  Procedure Laterality Date  . ABDOMINAL HYSTERECTOMY  1989  . BREAST BIOPSY  2013  . BUNIONECTOMY  10/2014  . CHOLECYSTECTOMY  2017  . COLONOSCOPY    . GALLBLADDER SURGERY    . hallux limi    .  LAPAROSCOPY    . TUBAL LIGATION      Social History   Socioeconomic History  . Marital status: Married    Spouse name: Not on file  . Number of children: Not on file  . Years of education: Not on file  . Highest education level: Not on file  Occupational History  . Not on file  Social Needs  . Financial resource strain: Not on file  . Food insecurity:    Worry: Not on file    Inability: Not on file  . Transportation needs:    Medical: Not on file    Non-medical: Not on file  Tobacco Use  . Smoking status: Never Smoker  . Smokeless tobacco: Never Used  Substance and Sexual Activity  . Alcohol use: No  . Drug use: No  . Sexual activity: Yes    Birth control/protection: Surgical  Lifestyle  . Physical activity:    Days per week: Not on file    Minutes per session: Not on file  . Stress: Not on file  Relationships  . Social connections:    Talks on phone: Not on file    Gets together: Not on file    Attends religious service: Not on file    Active member of club or organization: Not on file    Attends meetings of clubs or organizations: Not on file    Relationship status: Not on file  Other Topics Concern  . Not on file  Social History Narrative  . Not on file    Family History  Problem Relation Age of Onset  . Hypertension Mother   . Anemia Mother   . Arthritis Mother   . Kidney disease Mother   . Cataracts Mother   . Hypertension Sister   . Cataracts Sister   . Hypertension Brother   . Glaucoma Brother   . Diabetes Brother   . Cancer Maternal Grandmother        leukemia  . Cancer Maternal Grandfather        lung  . Glaucoma Maternal Aunt         Review of Systems  Constitutional: Negative for fever, malaise/fatigue and weight loss.  HENT: Negative for congestion and sore throat.   Eyes:       Negative for visual changes  Respiratory: Negative for cough and shortness of breath.   Cardiovascular: Negative for chest pain, palpitations and leg  swelling.  Gastrointestinal: Negative for blood in stool, constipation, diarrhea and heartburn.  Genitourinary: Negative for dysuria, frequency and urgency.  Musculoskeletal: Positive for joint pain. Negative for falls and myalgias.  Skin: Negative for rash.  Neurological: Negative for dizziness, tingling, sensory change, weakness, numbness and headaches.  Endo/Heme/Allergies: Does not bruise/bleed easily.  Psychiatric/Behavioral: Negative for depression, substance abuse and suicidal ideas. The  patient is not nervous/anxious.     Objective:   Vitals:   01/08/18 0808  BP: 124/70  Pulse: 78  Temp: 97.9 F (36.6 C)  SpO2: 98%    Body mass index is 37.73 kg/m.   Physical Examination:  Physical Exam  Constitutional: She is oriented to person, place, and time. She appears well-developed and well-nourished.  HENT:  Right Ear: External ear normal.  Left Ear: External ear normal.  Nose: Nose normal.  Mouth/Throat: Oropharynx is clear and moist. No oropharyngeal exudate.  Eyes: Pupils are equal, round, and reactive to light. Conjunctivae and EOM are normal.  Neck: Normal range of motion. Neck supple. No thyromegaly present.  Cardiovascular: Normal rate, regular rhythm and normal heart sounds.  Pulmonary/Chest: Effort normal and breath sounds normal. No respiratory distress. She exhibits no tenderness.  Declined breast exam, mammogram done 10/2017.  Abdominal: Soft. Bowel sounds are normal. She exhibits no distension. There is no tenderness.  Genitourinary:  Genitourinary Comments: S/p hysterectomy.  Musculoskeletal: Normal range of motion. She exhibits no edema.  Lymphadenopathy:    She has no cervical adenopathy.  Neurological: She is alert and oriented to person, place, and time. She has normal reflexes.  Skin: Skin is warm and dry. No rash noted.  Psychiatric: She has a normal mood and affect. Her behavior is normal. Thought content normal.  Vitals reviewed.   ASSESSMENT  and PLAN:  Tammye was seen today for follow-up.  Diagnoses and all orders for this visit:  Encounter for preventative adult health care exam with abnormal findings -     Comprehensive metabolic panel -     Lipid panel  HTN (hypertension), benign -     lisinopril (PRINIVIL,ZESTRIL) 10 MG tablet; Take 1 tablet (10 mg total) by mouth daily. -     metoprolol succinate (TOPROL-XL) 50 MG 24 hr tablet; Take 1 tablet (50 mg total) by mouth daily. Take with or immediately following a meal.  Hypothyroidism, unspecified type -     TSH -     T4, free -     levothyroxine (SYNTHROID, LEVOTHROID) 50 MCG tablet; Take 1 tablet (50 mcg total) by mouth daily before breakfast.  Acute right hip pain -     lidocaine (LIDODERM) 5 %; Place 1 patch onto the skin daily. Remove & Discard patch within 12 hours or as directed by MD -     Diclofenac Sodium (PENNSAID) 2 % SOLN; Place 1 Pump onto the skin 2 (two) times daily as needed.  Iron deficiency anemia, unspecified iron deficiency anemia type -     CBC -     Iron, TIBC and Ferritin Panel  Encounter for lipid screening for cardiovascular disease -     Lipid panel   No problem-specific Assessment & Plan notes found for this encounter.     Follow up: Return in about 6 months (around 07/11/2018) for HTN and hypothyroidism.  Wilfred Lacy, NP

## 2018-01-08 NOTE — Patient Instructions (Addendum)
Sign medical release to get recent ophthalmology report.  Alternate between lidocaine patch and diclofenac gel for hip pain. Continue to exercise and cold compress as directed by PT. Diclofenac gel will be mailed to you by [facility].  Follow DASH diet and portion control to help decrease weight. Once released by podiatry, resume exercise.  Normal and stable lab results, except persistent low potassium due to use of HCTZ. Changed lisinopril/HCTZ to lisinopril. Discontinue potassium supplement. Continue other medications  DASH Eating Plan DASH stands for "Dietary Approaches to Stop Hypertension." The DASH eating plan is a healthy eating plan that has been shown to reduce high blood pressure (hypertension). It may also reduce your risk for type 2 diabetes, heart disease, and stroke. The DASH eating plan may also help with weight loss. What are tips for following this plan? General guidelines  Avoid eating more than 2,300 mg (milligrams) of salt (sodium) a day. If you have hypertension, you may need to reduce your sodium intake to 1,500 mg a day.  Limit alcohol intake to no more than 1 drink a day for nonpregnant women and 2 drinks a day for men. One drink equals 12 oz of beer, 5 oz of wine, or 1 oz of hard liquor.  Work with your health care provider to maintain a healthy body weight or to lose weight. Ask what an ideal weight is for you.  Get at least 30 minutes of exercise that causes your heart to beat faster (aerobic exercise) most days of the week. Activities may include walking, swimming, or biking.  Work with your health care provider or diet and nutrition specialist (dietitian) to adjust your eating plan to your individual calorie needs. Reading food labels  Check food labels for the amount of sodium per serving. Choose foods with less than 5 percent of the Daily Value of sodium. Generally, foods with less than 300 mg of sodium per serving fit into this eating plan.  To  find whole grains, look for the word "whole" as the first word in the ingredient list. Shopping  Buy products labeled as "low-sodium" or "no salt added."  Buy fresh foods. Avoid canned foods and premade or frozen meals. Cooking  Avoid adding salt when cooking. Use salt-free seasonings or herbs instead of table salt or sea salt. Check with your health care provider or pharmacist before using salt substitutes.  Do not fry foods. Cook foods using healthy methods such as baking, boiling, grilling, and broiling instead.  Cook with heart-healthy oils, such as olive, canola, soybean, or sunflower oil. Meal planning   Eat a balanced diet that includes: ? 5 or more servings of fruits and vegetables each day. At each meal, try to fill half of your plate with fruits and vegetables. ? Up to 6-8 servings of whole grains each day. ? Less than 6 oz of lean meat, poultry, or fish each day. A 3-oz serving of meat is about the same size as a deck of cards. One egg equals 1 oz. ? 2 servings of low-fat dairy each day. ? A serving of nuts, seeds, or beans 5 times each week. ? Heart-healthy fats. Healthy fats called Omega-3 fatty acids are found in foods such as flaxseeds and coldwater fish, like sardines, salmon, and mackerel.  Limit how much you eat of the following: ? Canned or prepackaged foods. ? Food that is high in trans fat, such as fried foods. ? Food that is high in saturated fat, such as fatty meat. ? Sweets,  desserts, sugary drinks, and other foods with added sugar. ? Full-fat dairy products.  Do not salt foods before eating.  Try to eat at least 2 vegetarian meals each week.  Eat more home-cooked food and less restaurant, buffet, and fast food.  When eating at a restaurant, ask that your food be prepared with less salt or no salt, if possible. What foods are recommended? The items listed may not be a complete list. Talk with your dietitian about what dietary choices are best for  you. Grains Whole-grain or whole-wheat bread. Whole-grain or whole-wheat pasta. Brown rice. [place] Morrow. Bulgur. Whole-grain and low-sodium cereals. Pita bread. Low-fat, low-sodium crackers. Whole-wheat flour tortillas. Vegetables Fresh or frozen vegetables (raw, steamed, roasted, or grilled). Low-sodium or reduced-sodium tomato and vegetable juice. Low-sodium or reduced-sodium tomato sauce and tomato paste. Low-sodium or reduced-sodium canned vegetables. Fruits All fresh, dried, or frozen fruit. Canned fruit in natural juice (without added sugar). Meat and other protein foods Skinless chicken or [place]. Ground chicken or [place]. Pork with fat trimmed off. Fish and seafood. Egg whites. Dried beans, peas, or lentils. Unsalted nuts, nut butters, and seeds. Unsalted canned beans. Lean cuts of beef with fat trimmed off. Low-sodium, lean deli meat. Dairy Low-fat (1%) or fat-free (skim) milk. Fat-free, low-fat, or reduced-fat cheeses. Nonfat, low-sodium ricotta or cottage cheese. Low-fat or nonfat yogurt. Low-fat, low-sodium cheese. Fats and oils Soft margarine without trans fats. Vegetable oil. Low-fat, reduced-fat, or light mayonnaise and salad dressings (reduced-sodium). Canola, safflower, olive, soybean, and sunflower oils. Avocado. Seasoning and other foods Herbs. Spices. Seasoning mixes without salt. Unsalted popcorn and pretzels. Fat-free sweets. What foods are not recommended? The items listed may not be a complete list. Talk with your dietitian about what dietary choices are best for you. Grains Baked goods made with fat, such as croissants, muffins, or some breads. Dry pasta or rice meal packs. Vegetables Creamed or fried vegetables. Vegetables in a cheese sauce. Regular canned vegetables (not low-sodium or reduced-sodium). Regular canned tomato sauce and paste (not low-sodium or reduced-sodium). Regular tomato and vegetable juice (not low-sodium or reduced-sodium). Angie Fava.  Olives. Fruits Canned fruit in a light or heavy syrup. Fried fruit. Fruit in cream or butter sauce. Meat and other protein foods Fatty cuts of meat. Ribs. Fried meat. Berniece Salines. Sausage. Bologna and other processed lunch meats. Salami. Fatback. Hotdogs. Bratwurst. Salted nuts and seeds. Canned beans with added salt. Canned or smoked fish. Whole eggs or egg yolks. Chicken or [place] with skin. Dairy Whole or 2% milk, cream, and half-and-half. Whole or full-fat cream cheese. Whole-fat or sweetened yogurt. Full-fat cheese. Nondairy creamers. Whipped toppings. Processed cheese and cheese spreads. Fats and oils Butter. Stick margarine. Lard. Shortening. Ghee. Bacon fat. Tropical oils, such as coconut, palm kernel, or palm oil. Seasoning and other foods Salted popcorn and pretzels. Onion salt, garlic salt, seasoned salt, table salt, and sea salt. Worcestershire sauce. Tartar sauce. Barbecue sauce. Teriyaki sauce. Soy sauce, including reduced-sodium. Steak sauce. Canned and packaged gravies. Fish sauce. Oyster sauce. Cocktail sauce. Horseradish that you find on the shelf. Ketchup. Mustard. Meat flavorings and tenderizers. Bouillon cubes. Hot sauce and Tabasco sauce. Premade or packaged marinades. Premade or packaged taco seasonings. Relishes. Regular salad dressings. Where to find more information:  National Heart, Lung, and [place]: https://wilson-eaton.com/  American Heart Association: www.heart.org Summary  The DASH eating plan is a healthy eating plan that has been shown to reduce high blood pressure (hypertension). It may also reduce your risk for type 2 diabetes, heart  disease, and stroke.  With the DASH eating plan, you should limit salt (sodium) intake to 2,300 mg a day. If you have hypertension, you may need to reduce your sodium intake to 1,500 mg a day.  When on the DASH eating plan, aim to eat more fresh fruits and vegetables, whole grains, lean proteins, low-fat dairy, and heart-healthy  fats.  Work with your health care provider or diet and nutrition specialist (dietitian) to adjust your eating plan to your individual calorie needs. This information is not intended to replace advice given to you by your health care provider. Make sure you discuss any questions you have with your health care provider. Document Released: 06/07/2011 Document Revised: 06/11/2016 Document Reviewed: 06/11/2016 Elsevier Interactive Patient Education  [employer].

## 2018-01-09 LAB — IRON,TIBC AND FERRITIN PANEL
%SAT: 12 % — AB (ref 16–45)
Ferritin: 106 ng/mL (ref 16–232)
IRON: 38 ug/dL — AB (ref 45–160)
TIBC: 320 ug/dL (ref 250–450)

## 2018-01-13 ENCOUNTER — Encounter: Payer: Self-pay | Admitting: Nurse Practitioner

## 2018-01-13 ENCOUNTER — Encounter: Payer: Self-pay | Admitting: Podiatry

## 2018-01-13 ENCOUNTER — Ambulatory Visit (INDEPENDENT_AMBULATORY_CARE_PROVIDER_SITE_OTHER): Payer: 59 | Admitting: Podiatry

## 2018-01-13 DIAGNOSIS — R269 Unspecified abnormalities of gait and mobility: Secondary | ICD-10-CM | POA: Diagnosis not present

## 2018-01-13 DIAGNOSIS — M25474 Effusion, right foot: Secondary | ICD-10-CM | POA: Diagnosis not present

## 2018-01-13 DIAGNOSIS — Z981 Arthrodesis status: Secondary | ICD-10-CM

## 2018-01-13 DIAGNOSIS — M25674 Stiffness of right foot, not elsewhere classified: Secondary | ICD-10-CM | POA: Diagnosis not present

## 2018-01-13 DIAGNOSIS — M25571 Pain in right ankle and joints of right foot: Secondary | ICD-10-CM | POA: Diagnosis not present

## 2018-01-13 DIAGNOSIS — M2021 Hallux rigidus, right foot: Secondary | ICD-10-CM

## 2018-01-14 MED FILL — LISINOPRIL 10 MG TABLET: 10 | 90 days supply | Qty: 90 | Fill #0

## 2018-01-14 MED FILL — LIDOCAINE PATCH 5%: 5 | 15 days supply | Qty: 15 | Fill #0

## 2018-01-14 NOTE — Progress Notes (Signed)
Subjective: Shelly Cohen is a 57 y.o. is seen today in office s/p right first MPJ implant removal with spacer MPJ arthrodesis performed on 10/09/2017.  She is going to PT this afternoon and this will be her last session. She states that she has been doing very well she is not having any pain.  She is been in regular shoe full-time and she states that she is very "pround" of her foot.  She has had no issues and no pain.  The swelling is also continue to improve.  She has no other concerns today.  Objective: General: No acute distress, AAOx3 -presents today walking in the cam boot. DP/PT pulses palpable 2/4, CRT < 3 sec to all digits.  Protective sensation intact. Motor function intact.  RIGHT foot: Incision is well coapted without any evidence of dehiscence a scar has well formed.  There is decrease edema at the surgical site there is no erythema or increase in warmth.  There is no tenderness palpation of the surgical site.  Arthrodesis site appears to be stable.  There is no other areas of tenderness identified at this time. No pain with calf compression, swelling, warmth, erythema.  No evidence of DVT.  Assessment and Plan:  Status post right first MPJ arthrodesis, doing well with no complications   -Treatment options discussed including all alternatives, risks, and complications -She is doing well at this time and she is having no pain.  She is back to a regular shoe.  I want her to gradually increase her activity level and get her back to walking that she was doing prior to surgery.  Continue with supportive shoes.  Will consider an orthotic if needed.  Will finish physical therapy today and after that as she is doing well she does not feel that she needs any further physical therapy so we will hold off on that.  Return in about 6 weeks (around 02/24/2018).  Trula Slade DPM

## 2018-01-15 MED FILL — LATANOPROST 0.005% EYE DRP: 0.005 | 30 days supply | Qty: 3 | Fill #1

## 2018-01-17 DIAGNOSIS — H401121 Primary open-angle glaucoma, left eye, mild stage: Secondary | ICD-10-CM | POA: Diagnosis not present

## 2018-01-21 ENCOUNTER — Encounter: Payer: Self-pay | Admitting: Nurse Practitioner

## 2018-01-22 ENCOUNTER — Encounter: Payer: Self-pay | Admitting: Podiatry

## 2018-01-22 ENCOUNTER — Encounter: Payer: Self-pay | Admitting: Nurse Practitioner

## 2018-01-23 ENCOUNTER — Other Ambulatory Visit: Payer: Self-pay | Admitting: Nurse Practitioner

## 2018-01-23 ENCOUNTER — Encounter: Payer: Self-pay | Admitting: Nurse Practitioner

## 2018-01-23 DIAGNOSIS — H401131 Primary open-angle glaucoma, bilateral, mild stage: Secondary | ICD-10-CM

## 2018-01-27 ENCOUNTER — Encounter: Payer: Self-pay | Admitting: Nurse Practitioner

## 2018-01-27 ENCOUNTER — Ambulatory Visit (INDEPENDENT_AMBULATORY_CARE_PROVIDER_SITE_OTHER): Payer: 59 | Admitting: Nurse Practitioner

## 2018-01-27 VITALS — BP 174/86 | HR 80 | Temp 98.1°F | Ht 63.0 in | Wt 216.0 lb

## 2018-01-27 DIAGNOSIS — R600 Localized edema: Secondary | ICD-10-CM | POA: Diagnosis not present

## 2018-01-27 DIAGNOSIS — I10 Essential (primary) hypertension: Secondary | ICD-10-CM | POA: Diagnosis not present

## 2018-01-27 DIAGNOSIS — E876 Hypokalemia: Secondary | ICD-10-CM

## 2018-01-27 LAB — BASIC METABOLIC PANEL
BUN: 9 mg/dL (ref 6–23)
CHLORIDE: 107 meq/L (ref 96–112)
CO2: 28 meq/L (ref 19–32)
Calcium: 9.2 mg/dL (ref 8.4–10.5)
Creatinine, Ser: 0.78 mg/dL (ref 0.40–1.20)
GFR: 98.03 mL/min (ref >60.00)
GLUCOSE: 101 mg/dL — AB (ref 70–99)
POTASSIUM: 3.4 meq/L — AB (ref 3.5–5.1)
SODIUM: 145 meq/L (ref 135–145)

## 2018-01-27 MED ORDER — FUROSEMIDE 20 MG PO TABS: 10.0000 mg | ORAL_TABLET | ORAL | 0 refills | Status: DC

## 2018-01-27 MED ORDER — LISINOPRIL 20 MG PO TABS: 20.0000 mg | ORAL_TABLET | Freq: Every day | ORAL | 3 refills | Status: DC

## 2018-01-27 MED ORDER — POTASSIUM CHLORIDE CRYS ER 20 MEQ PO TBCR: 40.0000 meq | EXTENDED_RELEASE_TABLET | Freq: Every day | ORAL | 0 refills | Status: DC

## 2018-01-27 MED FILL — LISINOPRIL 20 MG TABLET: 20 | 90 days supply | Qty: 90 | Fill #0

## 2018-01-27 MED FILL — FUROSEMIDE 20 MG TABS: 20 | 70 days supply | Qty: 10 | Fill #0

## 2018-01-27 MED FILL — POTASSIUM CL ER 20 MEQ TABL: 20 | 3 days supply | Qty: 6 | Fill #0

## 2018-01-27 NOTE — Progress Notes (Signed)
Subjective:  Patient ID: Shelly Cohen, female    DOB: Aug 14, 1960  Age: 57 y.o. MRN: [id number]  CC: Leg Swelling (patient is complaining of both ankles swelling since started lisinopril, today is better due to elavated her legs when rest. patient has BP reading in phone with her today. this has been going on about 2 weeks. )  HPI  HTN: Elevated after discontinuation of HCTZ. HCTZ was discontinue due to persistent low potassium, despite use of potassium supplement. Reports Home BP reading of 150s-170s/80s-90s BP Readings from Last 3 Encounters:  01/27/18 (!) 174/86  01/08/18 124/70  08/02/17 134/72    Bilateral LE edema: Onset after discontinuation of HCTZ. Improves with elevation at bedtime. Maintains DASH diet.  Reviewed past Medical, Social and Family history today.  Outpatient Medications Prior to Visit  Medication Sig Dispense Refill  . aspirin EC 81 MG tablet Take by mouth.    . Diclofenac Sodium (PENNSAID) 2 % SOLN Place 1 Pump onto the skin 2 (two) times daily as needed. 112 g 1  . ferrous sulfate 325 (65 FE) MG tablet Take by mouth.    . latanoprost (XALATAN) 0.005 % ophthalmic solution   5  . Latanoprostene Bunod (VYZULTA) 0.024 % SOLN Apply 1 drop to eye 2 (two) times daily.    Marland Kitchen levothyroxine (SYNTHROID, LEVOTHROID) 50 MCG tablet Take 1 tablet (50 mcg total) by mouth daily before breakfast. 90 tablet 3  . lidocaine (LIDODERM) 5 % Place 1 patch onto the skin daily. Remove & Discard patch within 12 hours or as directed by MD 15 patch 1  . metoprolol succinate (TOPROL-XL) 50 MG 24 hr tablet Take 1 tablet (50 mg total) by mouth daily. Take with or immediately following a meal. 90 tablet 3  . Multiple Vitamin (MULTI-VITAMINS) TABS Take by mouth.    . Omega-3 Fatty Acids (FISH OIL PO) Take by mouth.    . Polyethylene Glycol 3350 (MIRALAX PO) Take by mouth.    . promethazine (PHENERGAN) 25 MG tablet   0  . lisinopril (PRINIVIL,ZESTRIL) 10 MG tablet Take 1 tablet (10  mg total) by mouth daily. 90 tablet 1   No facility-administered medications prior to visit.     ROS Review of Systems  Constitutional: Negative for malaise/fatigue.  Respiratory: Negative.   Cardiovascular: Positive for leg swelling. Negative for chest pain and palpitations.  Neurological: Negative.      Objective:  BP (!) 174/86   Pulse 80   Temp 98.1 F (36.7 C) (Oral)   Ht 5\' 3"  (1.6 m)   Wt 216 lb (98 kg)   SpO2 99%   BMI 38.26 kg/m   BP Readings from Last 3 Encounters:  01/27/18 (!) 174/86  01/08/18 124/70  08/02/17 134/72    Wt Readings from Last 3 Encounters:  01/27/18 216 lb (98 kg)  01/08/18 213 lb (96.6 kg)  08/02/17 212 lb (96.2 kg)    Physical Exam  Constitutional: She is oriented to person, place, and time. No distress.  Cardiovascular: Normal rate and regular rhythm.  Pulmonary/Chest: Effort normal and breath sounds normal.  Musculoskeletal: She exhibits edema.  Neurological: She is alert and oriented to person, place, and time.  Vitals reviewed.   Lab Results  Component Value Date   WBC 7.6 01/08/2018   HGB 11.3 (L) 01/08/2018   HCT 35.7 (L) 01/08/2018   PLT 332.0 01/08/2018   GLUCOSE 101 (H) 01/27/2018   CHOL 167 01/08/2018   TRIG 80.0 01/08/2018   HDL 55.80  01/08/2018   LDLCALC 95 01/08/2018   ALT 13 01/08/2018   AST 15 01/08/2018   NA 145 01/27/2018   K 3.4 (L) 01/27/2018   CL 107 01/27/2018   CREATININE 0.78 01/27/2018   BUN 9 01/27/2018   CO2 28 01/27/2018   TSH 2.61 01/08/2018    Mm 3d Screen Breast Bilateral  Result Date: 11/05/2017 CLINICAL DATA:  Screening. EXAM: DIGITAL SCREENING BILATERAL MAMMOGRAM WITH TOMO AND CAD COMPARISON:  Previous exam(s). ACR Breast Density Category b: There are scattered areas of fibroglandular density. FINDINGS: There are no findings suspicious for malignancy. Images were processed with CAD. IMPRESSION: No mammographic evidence of malignancy. A result letter of this screening mammogram will be  mailed directly to the patient. RECOMMENDATION: Screening mammogram in one year. (Code:SM-B-01Y) BI-RADS CATEGORY  1: Negative. Electronically Signed   By: Margarette Canada M.D.   On: 11/05/2017 16:52    Assessment & Plan:   Shelly Cohen was seen today for leg swelling.  Diagnoses and all orders for this visit:  HTN (hypertension), benign -     Basic metabolic panel -     lisinopril (PRINIVIL,ZESTRIL) 20 MG tablet; Take 1 tablet (20 mg total) by mouth daily.  Bilateral leg edema -     Basic metabolic panel -     furosemide (LASIX) 20 MG tablet; Take 0.5 tablets (10 mg total) by mouth 2 (two) times a week.  Hypokalemia -     potassium chloride SA (K-DUR,KLOR-CON) 20 MEQ tablet; Take 2 tablets (40 mEq total) by mouth daily.   I have changed Shelly Cohen's lisinopril. I am also having her start on potassium chloride SA and furosemide. Additionally, I am having her maintain her MULTI-VITAMINS, ferrous sulfate, aspirin EC, Omega-3 Fatty Acids (FISH OIL PO), Polyethylene Glycol 3350 (MIRALAX PO), promethazine, latanoprost, lidocaine, Diclofenac Sodium, levothyroxine, metoprolol succinate, and Latanoprostene Bunod.  Meds ordered this encounter  Medications  . lisinopril (PRINIVIL,ZESTRIL) 20 MG tablet    Sig: Take 1 tablet (20 mg total) by mouth daily.    Dispense:  90 tablet    Refill:  3    Order Specific Question:   Supervising Provider    Answer:   Lucille Passy [[id number]]  . potassium chloride SA (K-DUR,KLOR-CON) 20 MEQ tablet    Sig: Take 2 tablets (40 mEq total) by mouth daily.    Dispense:  6 tablet    Refill:  0    Order Specific Question:   Supervising Provider    Answer:   Lucille Passy [[id number]]  . furosemide (LASIX) 20 MG tablet    Sig: Take 0.5 tablets (10 mg total) by mouth 2 (two) times a week.    Dispense:  10 tablet    Refill:  0    Order Specific Question:   Supervising Provider    Answer:   Lucille Passy [[id number]]    Follow-up: Return in about 3 months (around 04/29/2018)  for HTN and edema.  Wilfred Lacy, NP

## 2018-01-27 NOTE — Patient Instructions (Addendum)
Use compression stocking during the day and off at night.  Continue to monitor BP 2-3 times a week. Let me know if BP is persistently >140/80.  Persistent decrease in potassium. Increase lisinopril to 20mg . Sent potassium supplement x 3days only Furosemide sent to be used only 2x/week as needed for severe LE edema. Compression stocking daily  Will send medication after review of lab results.

## 2018-01-28 ENCOUNTER — Encounter: Payer: Self-pay | Admitting: Nurse Practitioner

## 2018-02-11 MED FILL — METOPROLOL SUCCINATE ER 50: 50 | 90 days supply | Qty: 90 | Fill #1

## 2018-02-24 ENCOUNTER — Ambulatory Visit (INDEPENDENT_AMBULATORY_CARE_PROVIDER_SITE_OTHER): Payer: 59 | Admitting: Podiatry

## 2018-02-24 ENCOUNTER — Encounter: Payer: 59 | Admitting: Podiatry

## 2018-02-24 DIAGNOSIS — Z981 Arthrodesis status: Secondary | ICD-10-CM

## 2018-02-24 DIAGNOSIS — M2021 Hallux rigidus, right foot: Secondary | ICD-10-CM

## 2018-02-25 NOTE — Progress Notes (Signed)
Subjective: Shelly Cohen is a 57 y.o. is seen today in office s/p right first MPJ implant removal with spacer MPJ arthrodesis performed on 10/09/2017.  Overall she states that she is doing very well and she feels that she is "100% better" than she was before surgery.  She is been trying to regular tennis shoe with overall much improvement.  She gets occasional sharp pains at times but she is been having after the surgery but this is getting much better for her as well.  She still gets some numbness from the incision site.  No significant swelling and overall swelling is improving as well.  She has no other issues she has no other concerns today.  Objective: General: No acute distress, AAOx3 -presents today walking in the cam boot. DP/PT pulses palpable 2/4, CRT < 3 sec to all digits.  Protective sensation intact. Motor function intact.  RIGHT foot: Incision is well coapted without any evidence of dehiscence a scar has well formed.  Minimal swelling to the foot today. There is no pain on the surgical site.  Arthrodesis site is stable.  There is no erythema increased warmth there is no clinical signs of infection.  Decreased range of motion of the hallux IPJ.  Still some subjective numbness along the incision but overall her sensation is intact.  No other areas of tenderness identified at this time. No pain with calf compression, swelling, warmth, erythema.  Assessment and Plan:  Status post right first MPJ arthrodesis, doing well with no complications   -Treatment options discussed including all alternatives, risks, and complications -Overall she is doing much better.  She is back to regular shoe.  Will gradually increase her activity level and we discussed getting back to exercising on a gradual basis.  She is been on her feet quite a bit but she is not specifically exercising.  Gradually work on this and discussed compression sock.  Overall I do think she will benefit from an orthotic, for  follow-up with Liliane Channel in a couple weeks to get measured for the insert.  Check orthotic coverage from her insurance for her as well.  At this point she is doing well with discharge of the postoperative course however encouraged her to call any questions or concerns or any changes in symptoms.  She agrees with this plan has no further questions or concerns today.  Trula Slade DPM

## 2018-03-04 ENCOUNTER — Other Ambulatory Visit: Payer: 59 | Admitting: Orthotics

## 2018-03-04 DIAGNOSIS — H401121 Primary open-angle glaucoma, left eye, mild stage: Secondary | ICD-10-CM | POA: Diagnosis not present

## 2018-03-14 ENCOUNTER — Ambulatory Visit: Payer: 59 | Admitting: Nurse Practitioner

## 2018-03-17 ENCOUNTER — Other Ambulatory Visit: Payer: 59 | Admitting: Orthotics

## 2018-04-01 MED FILL — LEVOTHYROXINE 50 MCG TABLET: 50 | 90 days supply | Qty: 90 | Fill #0

## 2018-04-21 ENCOUNTER — Ambulatory Visit: Payer: 59 | Admitting: Nurse Practitioner

## 2018-04-22 ENCOUNTER — Encounter: Payer: Self-pay | Admitting: Nurse Practitioner

## 2018-04-22 ENCOUNTER — Ambulatory Visit (INDEPENDENT_AMBULATORY_CARE_PROVIDER_SITE_OTHER): Payer: 59

## 2018-04-22 ENCOUNTER — Ambulatory Visit (INDEPENDENT_AMBULATORY_CARE_PROVIDER_SITE_OTHER): Payer: 59 | Admitting: Nurse Practitioner

## 2018-04-22 VITALS — BP 152/94 | HR 81 | Temp 98.0°F | Ht 63.0 in | Wt 213.0 lb

## 2018-04-22 DIAGNOSIS — M1611 Unilateral primary osteoarthritis, right hip: Secondary | ICD-10-CM

## 2018-04-22 DIAGNOSIS — I10 Essential (primary) hypertension: Secondary | ICD-10-CM | POA: Diagnosis not present

## 2018-04-22 DIAGNOSIS — M169 Osteoarthritis of hip, unspecified: Secondary | ICD-10-CM | POA: Insufficient documentation

## 2018-04-22 DIAGNOSIS — M25551 Pain in right hip: Secondary | ICD-10-CM

## 2018-04-22 DIAGNOSIS — M79645 Pain in left finger(s): Secondary | ICD-10-CM | POA: Diagnosis not present

## 2018-04-22 MED ORDER — METOPROLOL SUCCINATE ER 100 MG PO TB24: 100.0000 mg | ORAL_TABLET | Freq: Every day | ORAL | 5 refills | Status: DC

## 2018-04-22 MED FILL — METOPROLOL SUCCINATE ER 100: 100 | 30 days supply | Qty: 30 | Fill #0

## 2018-04-22 NOTE — Progress Notes (Signed)
Subjective:  Patient ID: Shelly Cohen, female    DOB: 08-27-1960  Age: 57 y.o. MRN: [id number]  CC: Pain (patient is complaining of hip pain at times, painful when move certain way. diclofenac do not helping. BP med consult--time to take?) and Hand Pain (left hand painful, seem like trigger finger. )  HPI  HTN: Elevated with headache. Some improvement with increase dose of lisinopril. BP Readings from Last 3 Encounters:  04/22/18 (!) 152/94  01/27/18 (!) 174/86  01/08/18 124/70   Right hip pain: Ongoing for 3-70months, no injury. Lateral hip pain with joint stiffness and tingling at hip, no radiation, no weakness, has difficulty with standing and walking for long. No improvement with exercise, NSAIDs and lidocaine.  Also complains of left thumb pain and stiffness, onset several months ago, getting worse, difficulty in holding objects due to restricted ROM. Denies any trauma.  Reviewed past Medical, Social and Family history today.  Outpatient Medications Prior to Visit  Medication Sig Dispense Refill  . aspirin EC 81 MG tablet Take by mouth.    . Diclofenac Sodium (PENNSAID) 2 % SOLN Place 1 Pump onto the skin 2 (two) times daily as needed. 112 g 1  . ferrous sulfate 325 (65 FE) MG tablet Take by mouth.    . latanoprost (XALATAN) 0.005 % ophthalmic solution   5  . Latanoprostene Bunod (VYZULTA) 0.024 % SOLN Apply 1 drop to eye 2 (two) times daily.    Marland Kitchen levothyroxine (SYNTHROID, LEVOTHROID) 50 MCG tablet Take 1 tablet (50 mcg total) by mouth daily before breakfast. 90 tablet 3  . lidocaine (LIDODERM) 5 % Place 1 patch onto the skin daily. Remove & Discard patch within 12 hours or as directed by MD 15 patch 1  . lisinopril (PRINIVIL,ZESTRIL) 20 MG tablet Take 1 tablet (20 mg total) by mouth daily. 90 tablet 3  . Multiple Vitamin (MULTI-VITAMINS) TABS Take by mouth.    . Omega-3 Fatty Acids (FISH OIL PO) Take by mouth.    . metoprolol succinate (TOPROL-XL) 50 MG 24 hr tablet  Take 1 tablet (50 mg total) by mouth daily. Take with or immediately following a meal. 90 tablet 3  . potassium chloride SA (K-DUR,KLOR-CON) 20 MEQ tablet Take 2 tablets (40 mEq total) by mouth daily. 6 tablet 0  . Polyethylene Glycol 3350 (MIRALAX PO) Take by mouth.    . promethazine (PHENERGAN) 25 MG tablet   0  . furosemide (LASIX) 20 MG tablet Take 0.5 tablets (10 mg total) by mouth 2 (two) times a week. (Patient not taking: Reported on 04/22/2018) 10 tablet 0   No facility-administered medications prior to visit.     ROS See HPI  Objective:  BP (!) 152/94   Pulse 81   Temp 98 F (36.7 C) (Oral)   Ht 5\' 3"  (1.6 m)   Wt 213 lb (96.6 kg)   SpO2 98%   BMI 37.73 kg/m   BP Readings from Last 3 Encounters:  04/22/18 (!) 152/94  01/27/18 (!) 174/86  01/08/18 124/70    Wt Readings from Last 3 Encounters:  04/22/18 213 lb (96.6 kg)  01/27/18 216 lb (98 kg)  01/08/18 213 lb (96.6 kg)   Physical Exam  Constitutional: She is oriented to person, place, and time.  Cardiovascular: Normal rate.  Pulmonary/Chest: Effort normal.  Musculoskeletal: She exhibits tenderness. She exhibits no deformity.       Left wrist: Normal.       Right hip: She exhibits decreased range of  motion, tenderness and bony tenderness. She exhibits normal strength.       Right knee: Normal.       Lumbar back: Normal.       Hands:      Right upper leg: Normal.  Unable to flex left 5th MCP.  Pain with external right hip rotation. Pain over lateral hip joint No tenderness over SI joint or   Neurological: She is alert and oriented to person, place, and time.  Vitals reviewed.   Lab Results  Component Value Date   WBC 7.6 01/08/2018   HGB 11.3 (L) 01/08/2018   HCT 35.7 (L) 01/08/2018   PLT 332.0 01/08/2018   GLUCOSE 101 (H) 01/27/2018   CHOL 167 01/08/2018   TRIG 80.0 01/08/2018   HDL 55.80 01/08/2018   LDLCALC 95 01/08/2018   ALT 13 01/08/2018   AST 15 01/08/2018   NA 145 01/27/2018   K 3.4  (L) 01/27/2018   CL 107 01/27/2018   CREATININE 0.78 01/27/2018   BUN 9 01/27/2018   CO2 28 01/27/2018   TSH 2.61 01/08/2018    Mm 3d Screen Breast Bilateral  Result Date: 11/05/2017 CLINICAL DATA:  Screening. EXAM: DIGITAL SCREENING BILATERAL MAMMOGRAM WITH TOMO AND CAD COMPARISON:  Previous exam(s). ACR Breast Density Category b: There are scattered areas of fibroglandular density. FINDINGS: There are no findings suspicious for malignancy. Images were processed with CAD. IMPRESSION: No mammographic evidence of malignancy. A result letter of this screening mammogram will be mailed directly to the patient. RECOMMENDATION: Screening mammogram in one year. (Code:SM-B-01Y) BI-RADS CATEGORY  1: Negative. Electronically Signed   By: Margarette Canada M.D.   On: 11/05/2017 16:52    Assessment & Plan:   Lariya was seen today for pain and hand pain.  Diagnoses and all orders for this visit:  HTN (hypertension), benign -     metoprolol succinate (TOPROL-XL) 100 MG 24 hr tablet; Take 1 tablet (100 mg total) by mouth daily. Take with or immediately following a meal.  Pain of left thumb -     Ambulatory referral to Orthopedics -     Ambulatory referral to Sports Medicine  Primary osteoarthritis of right hip -     DG HIP UNILAT WITH PELVIS 2-3 VIEWS RIGHT -     Ambulatory referral to Sports Medicine   I have discontinued [name]'s potassium chloride SA and furosemide. I have also changed her metoprolol succinate. Additionally, I am having her maintain her MULTI-VITAMINS, ferrous sulfate, aspirin EC, Omega-3 Fatty Acids (FISH OIL PO), Polyethylene Glycol 3350 (MIRALAX PO), promethazine, latanoprost, lidocaine, Diclofenac Sodium, levothyroxine, Latanoprostene Bunod, and lisinopril.  Meds ordered this encounter  Medications  . metoprolol succinate (TOPROL-XL) 100 MG 24 hr tablet    Sig: Take 1 tablet (100 mg total) by mouth daily. Take with or immediately following a meal.    Dispense:  30  tablet    Refill:  5    Order Specific Question:   Supervising Provider    Answer:   Lucille Passy [[id number]]    Follow-up: Return in about 4 weeks (around 05/20/2018) for HTN (needs repeat BMP, TSH, T4).  Wilfred Lacy, NP

## 2018-04-22 NOTE — Assessment & Plan Note (Signed)
Hip pain x 23months. No injury, onset after right foot surgery and use of podiatry boot. No improvement with NSAIDs and lidocaine patch. No improvement with hip and leg exercise. DG hip ordered today.

## 2018-04-22 NOTE — Assessment & Plan Note (Signed)
Elevated BP with metoprolol and lisinopril. HCTZ discontinued due to hypokalemia despite use of potassium supplement. BP Readings from Last 3 Encounters:  04/22/18 (!) 152/94  01/27/18 (!) 174/86  01/08/18 124/70

## 2018-04-22 NOTE — Patient Instructions (Addendum)
Increase toprol XL to 100mg  once a day Continue lisinopril as prescribed.  OA of bilateral hips (right >left). Referral to sport entered. You will be called to schedule appt with ortho for further evaluation of left thumb.

## 2018-04-23 ENCOUNTER — Ambulatory Visit (INDEPENDENT_AMBULATORY_CARE_PROVIDER_SITE_OTHER): Payer: 59 | Admitting: Orthotics

## 2018-04-23 DIAGNOSIS — Z981 Arthrodesis status: Secondary | ICD-10-CM

## 2018-04-23 DIAGNOSIS — M2021 Hallux rigidus, right foot: Secondary | ICD-10-CM

## 2018-04-23 DIAGNOSIS — M2022 Hallux rigidus, left foot: Secondary | ICD-10-CM | POA: Diagnosis not present

## 2018-04-23 NOTE — Progress Notes (Signed)
Patient is here today to be evaluated and cast for CMFO.  Patient has hx of Hallux Rigidus, and needs a supportive orthoses that will plantarflex first ray in order to lower hinge pin of first MPJ and enhance windless effect. Also due to joint immobility, ned morton extenstion. Plan of deep heel cup, hug arch, and Morton ext .Marland KitchenMarland KitchenEver to fab

## 2018-04-25 ENCOUNTER — Encounter: Payer: Self-pay | Admitting: Nurse Practitioner

## 2018-04-28 ENCOUNTER — Ambulatory Visit (INDEPENDENT_AMBULATORY_CARE_PROVIDER_SITE_OTHER): Payer: 59 | Admitting: Physician Assistant

## 2018-04-28 ENCOUNTER — Encounter: Payer: Self-pay | Admitting: Nurse Practitioner

## 2018-04-28 ENCOUNTER — Ambulatory Visit: Payer: Self-pay

## 2018-04-28 DIAGNOSIS — I10 Essential (primary) hypertension: Secondary | ICD-10-CM

## 2018-04-28 MED ORDER — HYDRALAZINE HCL 25 MG PO TABS: 25.0000 mg | ORAL_TABLET | Freq: Two times a day (BID) | ORAL | 1 refills | Status: DC

## 2018-04-28 MED FILL — hydrALAZINE HCL 25 MG TABS: 25 | 30 days supply | Qty: 60 | Fill #0

## 2018-04-28 NOTE — Telephone Encounter (Signed)
Charlotte please see note below.

## 2018-04-28 NOTE — Telephone Encounter (Signed)
Also Reports heart rate of 83. Advised to go home due to elevated BP. She is to stay home tomorrow and Wednesday. She is scheduled to see me on Wednesday at [facility] for re eval of BP. She is to send mychart message with BP readings tomorrow. She verbalized understanding.

## 2018-04-28 NOTE — Addendum Note (Signed)
Addended by: Wilfred Lacy L on: 04/28/2018 02:59 PM   Modules accepted: Orders

## 2018-04-28 NOTE — Telephone Encounter (Signed)
Incoming call from Patient with c/o the following blood pressures:  196/136, 196/120 192/128.  Patient states that she was in the office on last Tuesday.  Her blood pressure medication of Metoprolol  Was increased to 100mg  at night.  Takes Lisinopril 20 mg in am.   Patient states that she has headache since starting medication.  Has not missed any medication.  Denies numbness, and chest pain.  Denies blurred vision.  Patient states she ate 1/2/ bagel an 1/2 grapefruit.for breakfast.  Was preparing to eat lunch now.  Reviewed protocol with Patient.  Explained protocol and reviewed care advice with Patient.  Recommended that she go to ED/Urgent Care for further evaluation.  Patient states that Dr.  Lorayne Marek related to Patient to call her before going to ED. Explained to Patient that I would route telephone Encounter to Office with [facility].  Patient awaits phone call from Dr. Lorayne Marek.    Reason for Disposition . [6] Systolic BP  >= 226 OR Diastolic >= 333 AND [5] cardiac or neurologic symptoms (e.g., chest pain, difficulty breathing, unsteady gait, blurred vision)  Answer Assessment - Initial Assessment Questions 1. BLOOD PRESSURE: "What is the blood pressure?" "Did you take at least two measurements 5 minutes apart?"     196/130 196 /120 2. ONSET: "When did you take your blood pressure?"     *No Answer*  1: 49 pm 3. HOW: "How did you obtain the blood pressure?" (e.g., visiting nurse, automatic home BP monitor)     manually 4. HISTORY: "Do you have a history of high blood pressure?"     yes 5. MEDICATIONS: "Are you taking any medications for blood pressure?" "Have you missed any doses recently?"     no 6. OTHER SYMPTOMS: "Do you have any symptoms?" (e.g., headache, chest pain, blurred vision, difficulty breathing, weakness)     no 7. PREGNANCY: "Is there any chance you are pregnant?" "When was your last menstrual period?"     Never.  Protocols used: HIGH BLOOD PRESSURE-A-AH

## 2018-04-29 ENCOUNTER — Encounter: Payer: Self-pay | Admitting: Nurse Practitioner

## 2018-04-29 DIAGNOSIS — I10 Essential (primary) hypertension: Secondary | ICD-10-CM

## 2018-04-29 DIAGNOSIS — R519 Headache, unspecified: Secondary | ICD-10-CM

## 2018-04-29 DIAGNOSIS — R51 Headache: Secondary | ICD-10-CM

## 2018-04-29 MED ORDER — TRAMADOL HCL 50 MG PO TABS: 50.0000 mg | ORAL_TABLET | Freq: Three times a day (TID) | ORAL | 0 refills | Status: AC | PRN

## 2018-04-29 MED FILL — traMADol HCL 50 MG TABS: 50 | 3 days supply | Qty: 9 | Fill #0

## 2018-04-30 ENCOUNTER — Encounter: Payer: Self-pay | Admitting: Nurse Practitioner

## 2018-04-30 ENCOUNTER — Ambulatory Visit (INDEPENDENT_AMBULATORY_CARE_PROVIDER_SITE_OTHER): Payer: 59 | Admitting: Nurse Practitioner

## 2018-04-30 ENCOUNTER — Ambulatory Visit (INDEPENDENT_AMBULATORY_CARE_PROVIDER_SITE_OTHER): Payer: 59 | Admitting: Physician Assistant

## 2018-04-30 VITALS — BP 184/96 | HR 74 | Temp 98.0°F | Ht 63.0 in | Wt 212.0 lb

## 2018-04-30 DIAGNOSIS — E876 Hypokalemia: Secondary | ICD-10-CM

## 2018-04-30 DIAGNOSIS — I10 Essential (primary) hypertension: Secondary | ICD-10-CM

## 2018-04-30 DIAGNOSIS — I160 Hypertensive urgency: Secondary | ICD-10-CM | POA: Diagnosis not present

## 2018-04-30 DIAGNOSIS — E039 Hypothyroidism, unspecified: Secondary | ICD-10-CM | POA: Diagnosis not present

## 2018-04-30 LAB — BASIC METABOLIC PANEL
BUN: 9 mg/dL (ref 6–23)
CALCIUM: 9.4 mg/dL (ref 8.4–10.5)
CO2: 27 meq/L (ref 19–32)
CREATININE: 0.79 mg/dL (ref 0.40–1.20)
Chloride: 104 mEq/L (ref 96–112)
GFR: 96.52 mL/min (ref >60.00)
GLUCOSE: 86 mg/dL (ref 70–99)
Potassium: 3.4 mEq/L — ABNORMAL LOW (ref 3.5–5.1)
SODIUM: 139 meq/L (ref 135–145)

## 2018-04-30 LAB — CBC WITH DIFFERENTIAL/PLATELET
BASOS PCT: 0.5 % (ref 0.0–3.0)
Basophils Absolute: 0 10*3/uL (ref 0.0–0.1)
EOS PCT: 2.1 % (ref 0.0–5.0)
Eosinophils Absolute: 0.1 10*3/uL (ref 0.0–0.7)
HCT: 37.5 % (ref 36.0–46.0)
HEMOGLOBIN: 11.9 g/dL — AB (ref 12.0–15.0)
LYMPHS ABS: 2.1 10*3/uL (ref 0.7–4.0)
Lymphocytes Relative: 31.3 % (ref 12.0–46.0)
MCHC: 31.7 g/dL (ref 30.0–36.0)
MCV: 75.8 fl — AB (ref 78.0–100.0)
MONO ABS: 0.3 10*3/uL (ref 0.1–1.0)
Monocytes Relative: 4.8 % (ref 3.0–12.0)
Neutro Abs: 4.1 10*3/uL (ref 1.4–7.7)
Neutrophils Relative %: 61.3 % (ref 43.0–77.0)
Platelets: 287 10*3/uL (ref 150.0–400.0)
RBC: 4.94 Mil/uL (ref 3.87–5.11)
RDW: 18.5 % — ABNORMAL HIGH (ref 11.5–15.5)
WBC: 6.8 10*3/uL (ref 4.0–10.5)

## 2018-04-30 LAB — URINALYSIS
BILIRUBIN URINE: NEGATIVE
Hgb urine dipstick: NEGATIVE
KETONES UR: NEGATIVE
Leukocytes, UA: NEGATIVE
Nitrite: NEGATIVE
PH: 6 (ref 5.0–8.0)
SPECIFIC GRAVITY, URINE: 1.01 (ref 1.000–1.030)
URINE GLUCOSE: NEGATIVE
Urobilinogen, UA: 0.2 (ref 0.0–1.0)

## 2018-04-30 LAB — TSH: TSH: 3.39 u[IU]/mL (ref 0.35–4.50)

## 2018-04-30 LAB — T4, FREE: FREE T4: 0.86 ng/dL (ref 0.60–1.60)

## 2018-04-30 MED ORDER — AMLODIPINE BESYLATE 5 MG PO TABS: 5.0000 mg | ORAL_TABLET | Freq: Every day | ORAL | 3 refills | Status: DC

## 2018-04-30 MED FILL — AMLODIPINE BESYLATE 5 MG TA: 5 | 90 days supply | Qty: 90 | Fill #0

## 2018-04-30 NOTE — Patient Instructions (Addendum)
Normal ECG. Stop hydralazine Start amlodipine. Continue lisinopril and metoprolol.  Stable lab results. Persistent mild decrease in potassium, but stable Trace protein in urine but negative for RBCs Need to do additional labs and need renal US Return to lab for blood draw You will be contacted to schedule appt for renal US  F/up in 1week as discussed.  Send BP reading via mychart on Friday. Check BP once a day in morning.   Managing Your Hypertension Hypertension is commonly called high blood pressure. This is when the force of your blood pressing against the walls of your arteries is too strong. Arteries are blood vessels that carry blood from your heart throughout your body. Hypertension forces the heart to work harder to pump blood, and may cause the arteries to become narrow or stiff. Having untreated or uncontrolled hypertension can cause heart attack, stroke, kidney disease, and other problems. What are blood pressure readings? A blood pressure reading consists of a higher number over a lower number. Ideally, your blood pressure should be below 120/80. The first ("top") number is called the systolic pressure. It is a measure of the pressure in your arteries as your heart beats. The second ("bottom") number is called the diastolic pressure. It is a measure of the pressure in your arteries as the heart relaxes. What does my blood pressure reading mean? Blood pressure is classified into four stages. Based on your blood pressure reading, your health care provider may use the following stages to determine what type of treatment you need, if any. Systolic pressure and diastolic pressure are measured in a unit called mm Hg. Normal  Systolic pressure: below 557.  Diastolic pressure: below 80. Elevated  Systolic pressure: 322-025.  Diastolic pressure: below 80. Hypertension stage 1  Systolic pressure: 427-062.  Diastolic pressure: 37-62. Hypertension stage 2  Systolic pressure:  831 or above.  Diastolic pressure: 90 or above. What health risks are associated with hypertension? Managing your hypertension is an important responsibility. Uncontrolled hypertension can lead to:  A heart attack.  A stroke.  A weakened blood vessel (aneurysm).  Heart failure.  Kidney damage.  Eye damage.  Metabolic syndrome.  Memory and concentration problems.  What changes can I make to manage my hypertension? Hypertension can be managed by making lifestyle changes and possibly by taking medicines. Your health care provider will help you make a plan to bring your blood pressure within a normal range. Eating and drinking  Eat a diet that is high in fiber and potassium, and low in salt (sodium), added sugar, and fat. An example eating plan is called the DASH (Dietary Approaches to Stop Hypertension) diet. To eat this way: ? Eat plenty of fresh fruits and vegetables. Try to fill half of your plate at each meal with fruits and vegetables. ? Eat whole grains, such as whole wheat pasta, brown rice, or whole grain bread. Fill about one quarter of your plate with whole grains. ? Eat low-fat diary products. ? Avoid fatty cuts of meat, processed or cured meats, and poultry with skin. Fill about one quarter of your plate with lean proteins such as fish, chicken without skin, beans, eggs, and tofu. ? Avoid premade and processed foods. These tend to be higher in sodium, added sugar, and fat.  Reduce your daily sodium intake. Most people with hypertension should eat less than 1,500 mg of sodium a day.  Limit alcohol intake to no more than 1 drink a day for nonpregnant women and 2 drinks a day  for men. One drink equals 12 oz of beer, 5 oz of wine, or 1 oz of hard liquor. Lifestyle  Work with your health care provider to maintain a healthy body weight, or to lose weight. Ask what an ideal weight is for you.  Get at least 30 minutes of exercise that causes your heart to beat faster  (aerobic exercise) most days of the week. Activities may include walking, swimming, or biking.  Include exercise to strengthen your muscles (resistance exercise), such as weight lifting, as part of your weekly exercise routine. Try to do these types of exercises for 30 minutes at least 3 days a week.  Do not use any products that contain nicotine or tobacco, such as cigarettes and e-cigarettes. If you need help quitting, ask your health care provider.  Control any long-term (chronic) conditions you have, such as high cholesterol or diabetes. Monitoring  Monitor your blood pressure at home as told by your health care provider. Your personal target blood pressure may vary depending on your medical conditions, your age, and other factors.  Have your blood pressure checked regularly, as often as told by your health care provider. Working with your health care provider  Review all the medicines you take with your health care provider because there may be side effects or interactions.  Talk with your health care provider about your diet, exercise habits, and other lifestyle factors that may be contributing to hypertension.  Visit your health care provider regularly. Your health care provider can help you create and adjust your plan for managing hypertension. Will I need medicine to control my blood pressure? Your health care provider may prescribe medicine if lifestyle changes are not enough to get your blood pressure under control, and if:  Your systolic blood pressure is 130 or higher.  Your diastolic blood pressure is 80 or higher.  Take medicines only as told by your health care provider. Follow the directions carefully. Blood pressure medicines must be taken as prescribed. The medicine does not work as well when you skip doses. Skipping doses also puts you at risk for problems. Contact a health care provider if:  You think you are having a reaction to medicines you have taken.  You have  repeated (recurrent) headaches.  You feel dizzy.  You have swelling in your ankles.  You have trouble with your vision. Get help right away if:  You develop a severe headache or confusion.  You have unusual weakness or numbness, or you feel faint.  You have severe pain in your chest or abdomen.  You vomit repeatedly.  You have trouble breathing. Summary  Hypertension is when the force of blood pumping through your arteries is too strong. If this condition is not controlled, it may put you at risk for serious complications.  Your personal target blood pressure may vary depending on your medical conditions, your age, and other factors. For most people, a normal blood pressure is less than 120/80.  Hypertension is managed by lifestyle changes, medicines, or both. Lifestyle changes include weight loss, eating a healthy, low-sodium diet, exercising more, and limiting alcohol. This information is not intended to replace advice given to you by your health care provider. Make sure you discuss any questions you have with your health care provider. Document Released: 03/12/2012 Document Revised: 05/16/2016 Document Reviewed: 05/16/2016 Elsevier Interactive Patient Education  [employer].

## 2018-04-30 NOTE — Progress Notes (Signed)
Subjective:  Patient ID: Shelly Cohen, female    DOB: 08-25-1960  Age: 57 y.o. MRN: [id number]  CC: Follow-up (follow up BP/headache is getting better. )  HPI  HTN urgency: Some improvement in headache with tramadol, no focal headache, no NSAID used. No paresthesia, no change in vision, no weakness, no dizziness. Current use of hydralazine, metoprolol and lisinopril. BP Readings from Last 3 Encounters:  04/30/18 (!) 184/96  04/22/18 (!) 152/94  01/27/18 (!) 174/86   Hypokalemia: Persistent despite discontinuation of HCTZ and potassium supplement.  Reviewed past Medical, Social and Family history today.  Outpatient Medications Prior to Visit  Medication Sig Dispense Refill  . aspirin EC 81 MG tablet Take by mouth.    . Diclofenac Sodium (PENNSAID) 2 % SOLN Place 1 Pump onto the skin 2 (two) times daily as needed. 112 g 1  . ferrous sulfate 325 (65 FE) MG tablet Take by mouth.    . latanoprost (XALATAN) 0.005 % ophthalmic solution   5  . Latanoprostene Bunod (VYZULTA) 0.024 % SOLN Apply 1 drop to eye 2 (two) times daily.    Marland Kitchen levothyroxine (SYNTHROID, LEVOTHROID) 50 MCG tablet Take 1 tablet (50 mcg total) by mouth daily before breakfast. 90 tablet 3  . lidocaine (LIDODERM) 5 % Place 1 patch onto the skin daily. Remove & Discard patch within 12 hours or as directed by MD 15 patch 1  . lisinopril (PRINIVIL,ZESTRIL) 20 MG tablet Take 1 tablet (20 mg total) by mouth daily. 90 tablet 3  . metoprolol succinate (TOPROL-XL) 100 MG 24 hr tablet Take 1 tablet (100 mg total) by mouth daily. Take with or immediately following a meal. 30 tablet 5  . Multiple Vitamin (MULTI-VITAMINS) TABS Take by mouth.    . Omega-3 Fatty Acids (FISH OIL PO) Take by mouth.    . Polyethylene Glycol 3350 (MIRALAX PO) Take by mouth.    . promethazine (PHENERGAN) 25 MG tablet   0  . traMADol (ULTRAM) 50 MG tablet Take 1 tablet (50 mg total) by mouth every 8 (eight) hours as needed for up to 3 days (for  headache). 9 tablet 0  . hydrALAZINE (APRESOLINE) 25 MG tablet Take 1 tablet (25 mg total) by mouth 2 (two) times daily. 60 tablet 1   No facility-administered medications prior to visit.     ROS See HPI  Objective:  BP (!) 184/96   Pulse 74   Temp 98 F (36.7 C) (Oral)   Ht 5\' 3"  (1.6 m)   Wt 212 lb (96.2 kg)   SpO2 99%   BMI 37.55 kg/m   BP Readings from Last 3 Encounters:  04/30/18 (!) 184/96  04/22/18 (!) 152/94  01/27/18 (!) 174/86    Wt Readings from Last 3 Encounters:  04/30/18 212 lb (96.2 kg)  04/22/18 213 lb (96.6 kg)  01/27/18 216 lb (98 kg)    Physical Exam  Constitutional: She is oriented to person, place, and time. She appears well-developed and well-nourished.  Eyes: Pupils are equal, round, and reactive to light. Conjunctivae and EOM are normal.  Neck: No JVD present.  Cardiovascular: Normal rate, regular rhythm and normal heart sounds.  Pulmonary/Chest: Effort normal and breath sounds normal.  Musculoskeletal: She exhibits no edema.  Neurological: She is alert and oriented to person, place, and time. No cranial nerve deficit.  Psychiatric: She has a normal mood and affect. Her behavior is normal. Thought content normal.  Vitals reviewed.   Lab Results  Component Value Date  WBC 6.8 04/30/2018   HGB 11.9 (L) 04/30/2018   HCT 37.5 04/30/2018   PLT 287.0 04/30/2018   GLUCOSE 86 04/30/2018   CHOL 167 01/08/2018   TRIG 80.0 01/08/2018   HDL 55.80 01/08/2018   LDLCALC 95 01/08/2018   ALT 13 01/08/2018   AST 15 01/08/2018   NA 139 04/30/2018   K 3.4 (L) 04/30/2018   CL 104 04/30/2018   CREATININE 0.79 04/30/2018   BUN 9 04/30/2018   CO2 27 04/30/2018   TSH 3.39 04/30/2018    Mm 3d Screen Breast Bilateral  Result Date: 11/05/2017 CLINICAL DATA:  Screening. EXAM: DIGITAL SCREENING BILATERAL MAMMOGRAM WITH TOMO AND CAD COMPARISON:  Previous exam(s). ACR Breast Density Category b: There are scattered areas of fibroglandular density. FINDINGS:  There are no findings suspicious for malignancy. Images were processed with CAD. IMPRESSION: No mammographic evidence of malignancy. A result letter of this screening mammogram will be mailed directly to the patient. RECOMMENDATION: Screening mammogram in one year. (Code:SM-B-01Y) BI-RADS CATEGORY  1: Negative. Electronically Signed   By: Margarette Canada M.D.   On: 11/05/2017 16:52    Assessment & Plan:   Shelly Cohen was seen today for follow-up.  Diagnoses and all orders for this visit:  HTN (hypertension), benign -     Cancel: POCT urinalysis dipstick -     EKG 12-Lead -     CBC w/Diff -     Basic metabolic panel -     Aldosterone + renin activity w/ ratio; Future -     VAS US RENAL ARTERY DUPLEX; Future -     amLODipine (NORVASC) 5 MG tablet; Take 1 tablet (5 mg total) by mouth daily.  Hypothyroidism, unspecified type -     TSH -     T4, free  Hypertensive urgency -     EKG 12-Lead -     CBC w/Diff -     Basic metabolic panel -     Urinalysis -     Aldosterone + renin activity w/ ratio; Future -     VAS US RENAL ARTERY DUPLEX; Future -     amLODipine (NORVASC) 5 MG tablet; Take 1 tablet (5 mg total) by mouth daily.  Hypokalemia -     Aldosterone + renin activity w/ ratio; Future   I have discontinued Shelly Cohen's hydrALAZINE. I am also having her start on amLODipine. Additionally, I am having her maintain her MULTI-VITAMINS, ferrous sulfate, aspirin EC, Omega-3 Fatty Acids (FISH OIL PO), Polyethylene Glycol 3350 (MIRALAX PO), promethazine, latanoprost, lidocaine, Diclofenac Sodium, levothyroxine, Latanoprostene Bunod, metoprolol succinate, lisinopril, and traMADol.  Meds ordered this encounter  Medications  . amLODipine (NORVASC) 5 MG tablet    Sig: Take 1 tablet (5 mg total) by mouth daily.    Dispense:  30 tablet    Refill:  3    Order Specific Question:   Supervising Provider    Answer:   Lucille Passy [[id number]]   Follow-up: Return in about 1 week (around 05/07/2018) for  HTN.  Wilfred Lacy, NP

## 2018-05-01 MED FILL — LISINOPRIL 20 MG TABLET: 20 | 90 days supply | Qty: 90 | Fill #0

## 2018-05-02 ENCOUNTER — Encounter: Payer: Self-pay | Admitting: Nurse Practitioner

## 2018-05-05 DIAGNOSIS — Z0279 Encounter for issue of other medical certificate: Secondary | ICD-10-CM

## 2018-05-06 ENCOUNTER — Ambulatory Visit (INDEPENDENT_AMBULATORY_CARE_PROVIDER_SITE_OTHER): Payer: 59 | Admitting: Orthopaedic Surgery

## 2018-05-07 ENCOUNTER — Ambulatory Visit (INDEPENDENT_AMBULATORY_CARE_PROVIDER_SITE_OTHER): Payer: 59 | Admitting: Nurse Practitioner

## 2018-05-07 ENCOUNTER — Encounter (INDEPENDENT_AMBULATORY_CARE_PROVIDER_SITE_OTHER): Payer: Self-pay | Admitting: Orthopaedic Surgery

## 2018-05-07 ENCOUNTER — Encounter: Payer: Self-pay | Admitting: Nurse Practitioner

## 2018-05-07 ENCOUNTER — Ambulatory Visit (INDEPENDENT_AMBULATORY_CARE_PROVIDER_SITE_OTHER): Payer: 59 | Admitting: Orthopaedic Surgery

## 2018-05-07 VITALS — BP 154/84 | HR 77 | Temp 97.9°F | Ht 63.0 in | Wt 209.0 lb

## 2018-05-07 DIAGNOSIS — I160 Hypertensive urgency: Secondary | ICD-10-CM | POA: Diagnosis not present

## 2018-05-07 DIAGNOSIS — I10 Essential (primary) hypertension: Secondary | ICD-10-CM

## 2018-05-07 DIAGNOSIS — M65312 Trigger thumb, left thumb: Secondary | ICD-10-CM | POA: Diagnosis not present

## 2018-05-07 DIAGNOSIS — M1611 Unilateral primary osteoarthritis, right hip: Secondary | ICD-10-CM

## 2018-05-07 MED ORDER — METOPROLOL SUCCINATE ER 100 MG PO TB24: 100.0000 mg | ORAL_TABLET | Freq: Every day | ORAL | 1 refills | Status: DC

## 2018-05-07 MED ORDER — METHYLPREDNISOLONE ACETATE 40 MG/ML IJ SUSP
13.3300 mg | INTRAMUSCULAR | Status: AC | PRN
  Administered 2018-05-07: 13.33 mg

## 2018-05-07 MED ORDER — BUPIVACAINE HCL 0.25 % IJ SOLN
0.3300 mL | INTRAMUSCULAR | Status: AC | PRN
  Administered 2018-05-07: .33 mL

## 2018-05-07 MED ORDER — LIDOCAINE HCL 1 % IJ SOLN
1.0000 mL | INTRAMUSCULAR | Status: AC | PRN
  Administered 2018-05-07: 1 mL

## 2018-05-07 MED ORDER — AMLODIPINE BESYLATE 2.5 MG PO TABS: 2.5000 mg | ORAL_TABLET | Freq: Every day | ORAL | 1 refills | Status: DC

## 2018-05-07 NOTE — Patient Instructions (Addendum)
Continue current mediations. Maintain DASH diet and adequate oral hydration  Maintain appt for renal US.  A temporary handicap placard form was completed today due to severe right hip pain  Make appt with Sport medicine to address hip pain.

## 2018-05-07 NOTE — Progress Notes (Signed)
Office Visit Note   Patient: Shelly Cohen           Date of Birth: [birth year]           MRN: [id number] Visit Date: 05/07/2018              Requested by: Flossie Buffy, NP [place], [place] [address] PCP: Flossie Buffy, NP   Assessment & Plan: Visit Diagnoses:  1. Trigger thumb, left thumb     Plan: Impression is left trigger thumb.  Today, we will proceed with a cortisone injection of the left A1 pulley.  I have also discussed surgical intervention should the need arise.  She will follow-up with [place] as needed.  Follow-Up Instructions: Return if symptoms worsen or fail to improve.   Orders:  Orders Placed This Encounter  Procedures  . Hand/UE Inj: L thumb A1   No orders of the defined types were placed in this encounter.     Procedures: Hand/UE Inj: L thumb A1 for trigger finger on 05/07/2018 2:16 PM Indications: pain Details: 25 G needle Medications: 1 mL lidocaine 1 %; 0.33 mL bupivacaine 0.25 %; 13.33 mg methylPREDNISolone acetate 40 MG/ML      Clinical Data: No additional findings.   Subjective: Chief Complaint  Patient presents with  . Left Thumb - Pain    HPI patient is a pleasant 57 year old female who presents to our clinic today with triggering to the left thumb.  This began approximately 4 to 5 months ago with the onset of pain followed by triggering.  This is progressively worsened.  She does note that she types all day which seems to make this worse.  She also has worsening of pain and triggering at night and while she is sleeping.  She does have a history of right thumb A1 pulley release without having previous cortisone injection a while back when living in [place].  Doing well with the right thumb.  Review of Systems as detailed in HPI.  All others reviewed and are negative.   Objective: Vital Signs: There were no vitals taken for this visit.  Physical Exam well-developed well-nourished female no acute  distress.  Alert and oriented x3.  Ortho Exam examination of the left thumb reveals a markedly tender and palpable nodule A1 pulley with evidence of triggering.  Specialty Comments:  No specialty comments available.  Imaging: No imaging   PMFS History: Patient Active Problem List   Diagnosis Date Noted  . Trigger thumb, left thumb 05/07/2018  . Acute right hip pain 04/22/2018  . Open angle primary glaucoma 01/08/2018  . Mass of breast, left 06/29/2016  . Thalassemia trait, alpha 06/29/2016  . Iron deficiency anemia 06/29/2016  . S/P bunionectomy 06/29/2016  . Great toe pain, right 06/29/2016  . Encounter for weight loss counseling 06/29/2016  . HTN (hypertension), benign 05/18/2016  . Hypothyroidism 05/18/2016   Past Medical History:  Diagnosis Date  . Alpha thalassemia trait   . Fibroadenoma   . Glaucoma    monitored every 58months  . History of positive PPD, treatment status unknown   . Hypertension   . Thyroid disease     Family History  Problem Relation Age of Onset  . Hypertension Mother   . Anemia Mother   . Arthritis Mother        rheumatoid arthritis  . Kidney disease Mother   . Cataracts Mother   . Heart disease Mother   . Hypertension Sister   .  Cataracts Sister   . Hypertension Brother   . Glaucoma Brother   . Diabetes Brother   . Cancer Maternal Grandmother        leukemia  . Cancer Maternal Grandfather        lung  . Glaucoma Maternal Aunt     Past Surgical History:  Procedure Laterality Date  . ABDOMINAL HYSTERECTOMY  1989  . BREAST BIOPSY  2013  . BUNIONECTOMY  10/2014  . CHOLECYSTECTOMY  2017  . COLONOSCOPY    . GALLBLADDER SURGERY    . hallux limi    . LAPAROSCOPY    . TUBAL LIGATION     Social History   Occupational History  . Not on file  Tobacco Use  . Smoking status: Never Smoker  . Smokeless tobacco: Never Used  Substance and Sexual Activity  . Alcohol use: No  . Drug use: No  . Sexual activity: Yes    Birth  control/protection: Surgical

## 2018-05-07 NOTE — Progress Notes (Signed)
Subjective:  Patient ID: Shelly Cohen, female    DOB: [birth year]  Age: 57 y.o. MRN: [id number]  CC: Follow-up (1 follow up/ has BP reading/ still has some headache. still has hip pain. 11/6:132/79, 11/5: 132/76, 11/4: 156/83, 11/3: 136/79)   HPI  HTN: Unable to tolerate amlodipine 5mg , caused leg cramps, symptoms improved with decreased dose. Has persistent headache, mild, worse in morning. Denies any snoring or daytime somnolence or edema Renal duplex scheduled for 05/12/18. Home BP readings: 130-140/70-80. BP Readings from Last 3 Encounters:  05/07/18 (!) 154/84  04/30/18 (!) 184/96  04/22/18 (!) 152/94    She is requesting for temporary handicap placard due to worsening right hip pain She is to make appt with sports medicine.  Reviewed past Medical, Social and Family history today.  Outpatient Medications Prior to Visit  Medication Sig Dispense Refill  . Ascorbic Acid (VITAMIN C PO) Take by mouth.    Marland Kitchen aspirin EC 81 MG tablet Take by mouth.    . Diclofenac Sodium (PENNSAID) 2 % SOLN Place 1 Pump onto the skin 2 (two) times daily as needed. 112 g 1  . ferrous sulfate 325 (65 FE) MG tablet Take by mouth.    . Latanoprostene Bunod (VYZULTA) 0.024 % SOLN Apply 1 drop to eye 2 (two) times daily.    Marland Kitchen levothyroxine (SYNTHROID, LEVOTHROID) 50 MCG tablet Take 1 tablet (50 mcg total) by mouth daily before breakfast. 90 tablet 3  . lidocaine (LIDODERM) 5 % Place 1 patch onto the skin daily. Remove & Discard patch within 12 hours or as directed by MD 15 patch 1  . lisinopril (PRINIVIL,ZESTRIL) 20 MG tablet Take 1 tablet (20 mg total) by mouth daily. 90 tablet 3  . Multiple Vitamin (MULTI-VITAMINS) TABS Take by mouth.    . Omega-3 Fatty Acids (FISH OIL PO) Take by mouth.    . Polyethylene Glycol 3350 (MIRALAX PO) Take by mouth.    . metoprolol succinate (TOPROL-XL) 100 MG 24 hr tablet Take 1 tablet (100 mg total) by mouth daily. Take with or immediately following a meal. 30  tablet 5  . latanoprost (XALATAN) 0.005 % ophthalmic solution   5  . promethazine (PHENERGAN) 25 MG tablet   0  . amLODipine (NORVASC) 5 MG tablet Take 1 tablet (5 mg total) by mouth daily. (Patient not taking: Reported on 05/07/2018) 30 tablet 3   No facility-administered medications prior to visit.     ROS See HPI  Objective:  BP (!) 154/84   Pulse 77   Temp 97.9 F (36.6 C) (Oral)   Ht 5\' 3"  (1.6 m)   Wt 209 lb (94.8 kg)   SpO2 98%   BMI 37.02 kg/m   BP Readings from Last 3 Encounters:  05/07/18 (!) 154/84  04/30/18 (!) 184/96  04/22/18 (!) 152/94    Wt Readings from Last 3 Encounters:  05/07/18 209 lb (94.8 kg)  04/30/18 212 lb (96.2 kg)  04/22/18 213 lb (96.6 kg)    Physical Exam  Constitutional: She is oriented to person, place, and time. She appears well-developed and well-nourished.  Neck: No JVD present.  Cardiovascular: Normal rate, regular rhythm, normal heart sounds and intact distal pulses.  Pulmonary/Chest: Effort normal and breath sounds normal.  Musculoskeletal: She exhibits no edema.  Neurological: She is alert and oriented to person, place, and time.  Skin: Skin is warm and dry.  Vitals reviewed.   Lab Results  Component Value Date   WBC 6.8 04/30/2018  HGB 11.9 (L) 04/30/2018   HCT 37.5 04/30/2018   PLT 287.0 04/30/2018   GLUCOSE 86 04/30/2018   CHOL 167 01/08/2018   TRIG 80.0 01/08/2018   HDL 55.80 01/08/2018   LDLCALC 95 01/08/2018   ALT 13 01/08/2018   AST 15 01/08/2018   NA 139 04/30/2018   K 3.4 (L) 04/30/2018   CL 104 04/30/2018   CREATININE 0.79 04/30/2018   BUN 9 04/30/2018   CO2 27 04/30/2018   TSH 3.39 04/30/2018    Mm 3d Screen Breast Bilateral  Result Date: 11/05/2017 CLINICAL DATA:  Screening. EXAM: DIGITAL SCREENING BILATERAL MAMMOGRAM WITH TOMO AND CAD COMPARISON:  Previous exam(s). ACR Breast Density Category b: There are scattered areas of fibroglandular density. FINDINGS: There are no findings suspicious for  malignancy. Images were processed with CAD. IMPRESSION: No mammographic evidence of malignancy. A result letter of this screening mammogram will be mailed directly to the patient. RECOMMENDATION: Screening mammogram in one year. (Code:SM-B-01Y) BI-RADS CATEGORY  1: Negative. Electronically Signed   By: Margarette Canada M.D.   On: 11/05/2017 16:52    Assessment & Plan:   Shelly Cohen was seen today for follow-up.  Diagnoses and all orders for this visit:  HTN (hypertension), benign -     amLODipine (NORVASC) 2.5 MG tablet; Take 1 tablet (2.5 mg total) by mouth daily. -     metoprolol succinate (TOPROL-XL) 100 MG 24 hr tablet; Take 1 tablet (100 mg total) by mouth daily. Take with or immediately following a meal.  Hypertensive urgency -     amLODipine (NORVASC) 2.5 MG tablet; Take 1 tablet (2.5 mg total) by mouth daily.   I have changed Shelly Cohen's amLODipine. I am also having her maintain her MULTI-VITAMINS, ferrous sulfate, aspirin EC, Omega-3 Fatty Acids (FISH OIL PO), Polyethylene Glycol 3350 (MIRALAX PO), promethazine, latanoprost, lidocaine, Diclofenac Sodium, levothyroxine, Latanoprostene Bunod, lisinopril, Ascorbic Acid (VITAMIN C PO), and metoprolol succinate.  Meds ordered this encounter  Medications  . amLODipine (NORVASC) 2.5 MG tablet    Sig: Take 1 tablet (2.5 mg total) by mouth daily.    Dispense:  90 tablet    Refill:  1    Order Specific Question:   Supervising Provider    Answer:   Lucille Passy [[id number]]  . metoprolol succinate (TOPROL-XL) 100 MG 24 hr tablet    Sig: Take 1 tablet (100 mg total) by mouth daily. Take with or immediately following a meal.    Dispense:  90 tablet    Refill:  1    Order Specific Question:   Supervising Provider    Answer:   Lucille Passy [[id number]]    Follow-up: Return in about 3 months (around 08/07/2018) for HTN and hypothyroidism.  Shelly Lacy, NP

## 2018-05-12 ENCOUNTER — Ambulatory Visit (HOSPITAL_COMMUNITY)
Admission: RE | Admit: 2018-05-12 | Discharge: 2018-05-12 | Disposition: A | Discharge status: Home / Self Care | Payer: 59 | Source: Ambulatory Visit | Attending: Nurse Practitioner | Admitting: Nurse Practitioner

## 2018-05-12 DIAGNOSIS — I160 Hypertensive urgency: Secondary | ICD-10-CM | POA: Diagnosis not present

## 2018-05-12 DIAGNOSIS — I10 Essential (primary) hypertension: Secondary | ICD-10-CM

## 2018-05-13 ENCOUNTER — Ambulatory Visit: Payer: 59 | Admitting: Family Medicine

## 2018-05-15 ENCOUNTER — Other Ambulatory Visit: Payer: 59 | Admitting: Orthotics

## 2018-05-16 ENCOUNTER — Ambulatory Visit (INDEPENDENT_AMBULATORY_CARE_PROVIDER_SITE_OTHER): Payer: 59 | Admitting: Family Medicine

## 2018-05-16 ENCOUNTER — Ambulatory Visit: Payer: Self-pay

## 2018-05-16 ENCOUNTER — Encounter: Payer: Self-pay | Admitting: Family Medicine

## 2018-05-16 VITALS — BP 134/88 | HR 79 | Resp 16 | Wt 212.0 lb

## 2018-05-16 DIAGNOSIS — M25551 Pain in right hip: Secondary | ICD-10-CM

## 2018-05-16 MED ORDER — METHYLPREDNISOLONE ACETATE 40 MG/ML IJ SUSP: 40.0000 mg | Freq: Once | INTRAMUSCULAR | Status: DC

## 2018-05-16 NOTE — Progress Notes (Signed)
Shelly Cohen - 57 y.o. female MRN [id number]  Date of birth: 1960-10-25  SUBJECTIVE:  Including CC & ROS.  No chief complaint on file.   Shelly Cohen is a 57 y.o. female that is presenting with right lateral hip pain.  The pain is been ongoing for a few months.  She had foot surgery and was placed in a cam walker.  During this time she developed his right lateral hip pain.  Pain is localized to the greater trochanter.  She denies any injury.  Pain is significant and nature.  It is sharp and stabbing.  No improvement with modalities today.  Denies any prior hip surgery.  Pain is worse with walking.   Review of Systems  Constitutional: Negative for fever.  HENT: Negative for congestion.   Respiratory: Negative for cough.   Cardiovascular: Negative for chest pain.  Gastrointestinal: Negative for abdominal pain.  Musculoskeletal: Positive for gait problem.  Skin: Negative for color change.  Neurological: Negative for weakness.  Hematological: Negative for adenopathy.  Psychiatric/Behavioral: Negative for agitation.    HISTORY: Past Medical, Surgical, Social, and Family History Reviewed & Updated per EMR.   Pertinent Historical Findings include:  Past Medical History:  Diagnosis Date  . Alpha thalassemia trait   . Fibroadenoma   . Glaucoma    monitored every 51months  . History of positive PPD, treatment status unknown   . Hypertension   . Thyroid disease     Past Surgical History:  Procedure Laterality Date  . ABDOMINAL HYSTERECTOMY  1989  . BREAST BIOPSY  2013  . BUNIONECTOMY  10/2014  . CHOLECYSTECTOMY  2017  . COLONOSCOPY    . GALLBLADDER SURGERY    . hallux limi    . LAPAROSCOPY    . TUBAL LIGATION      Allergies  Allergen Reactions  . Diphenhydramine Hcl Other (See Comments)  . Hydrocodone Nausea Only  . Azithromycin Itching and Rash  . Cephalexin Rash  . Penicillin G Rash  . Zolpidem Itching and Rash    Family History  Problem Relation Age of  Onset  . Hypertension Mother   . Anemia Mother   . Arthritis Mother        rheumatoid arthritis  . Kidney disease Mother   . Cataracts Mother   . Heart disease Mother   . Hypertension Sister   . Cataracts Sister   . Hypertension Brother   . Glaucoma Brother   . Diabetes Brother   . Cancer Maternal Grandmother        leukemia  . Cancer Maternal Grandfather        lung  . Glaucoma Maternal Aunt      Social History   Socioeconomic History  . Marital status: Married    Spouse name: Not on file  . Number of children: Not on file  . Years of education: Not on file  . Highest education level: Not on file  Occupational History  . Not on file  Social Needs  . Financial resource strain: Not on file  . Food insecurity:    Worry: Not on file    Inability: Not on file  . Transportation needs:    Medical: Not on file    Non-medical: Not on file  Tobacco Use  . Smoking status: Never Smoker  . Smokeless tobacco: Never Used  Substance and Sexual Activity  . Alcohol use: No  . Drug use: No  . Sexual activity: Yes    Birth control/protection: Surgical  Lifestyle  . Physical activity:    Days per week: Not on file    Minutes per session: Not on file  . Stress: Not on file  Relationships  . Social connections:    Talks on phone: Not on file    Gets together: Not on file    Attends religious service: Not on file    Active member of club or organization: Not on file    Attends meetings of clubs or organizations: Not on file    Relationship status: Not on file  . Intimate partner violence:    Fear of current or ex partner: Not on file    Emotionally abused: Not on file    Physically abused: Not on file    Forced sexual activity: Not on file  Other Topics Concern  . Not on file  Social History Narrative  . Not on file     PHYSICAL EXAM:  VS: BP 134/88   Pulse 79   Resp 16   Wt 212 lb (96.2 kg)   SpO2 98%   BMI 37.55 kg/m  Physical Exam Gen: NAD, alert,  cooperative with exam, well-appearing ENT: normal lips, normal nasal mucosa,  Eye: normal EOM, normal conjunctiva and lids CV:  no edema, +2 pedal pulses   Resp: no accessory muscle use, non-labored,   Skin: no rashes, no areas of induration  Neuro: normal tone, normal sensation to touch Psych:  normal insight, alert and oriented MSK:  Right hip: Tenderness palpation of the greater trochanter. No tenderness palpation of the piriformis, SI joint, or paraspinal muscles. Normal internal and external rotation of the hip. Normal strength resistance with hip flexion. Weakness with hip abduction reproduction of pain. Negative straight leg raise bilaterally. Normal FABER and FADIR  Neurovascularly intact    Aspiration/Injection Procedure Note Shelly Cohen Apr 07, 1961  Procedure: Injection Indications: right hip pain   Procedure Details Consent: Risks of procedure as well as the alternatives and risks of each were explained to the (patient/caregiver).  Consent for procedure obtained. Time Out: Verified patient identification, verified procedure, site/side was marked, verified correct patient position, special equipment/implants available, medications/allergies/relevent history reviewed, required imaging and test results available.  Performed.  The area was cleaned with iodine and alcohol swabs.    The right greater trochanteric bursa was injected using 1 cc's of 40 mg Depomedrol and 4 cc's of 0.5% bupivacaine with a 22 3 1/2" needle.  Ultrasound was used. Images were obtained in Transverse views showing the injection.   A sterile dressing was applied.  Patient did tolerate procedure well.       ASSESSMENT & PLAN:   Greater trochanteric pain syndrome Pain seems to be consistent with greater trochanteric bursitis versus gluteus medius.  Likely result of using a cam walker for such an extended period of time.  Possible to have a radicular component as well. -Greater trochanter  injection today. -Counseled on home exercise therapy and supportive care. -If no improvement will consider imaging versus physical therapy.  May need to look into the back as a source.  Less likely to be the hip joint itself.

## 2018-05-16 NOTE — Patient Instructions (Signed)
Nice to meet you  Please try the exercises  Please try putting heat or ice on the area  Please see me back in 3-4 weeks if your pain doesn't improve.

## 2018-05-18 NOTE — Assessment & Plan Note (Signed)
Pain seems to be consistent with greater trochanteric bursitis versus gluteus medius.  Likely result of using a cam walker for such an extended period of time.  Possible to have a radicular component as well. -Greater trochanter injection today. -Counseled on home exercise therapy and supportive care. -If no improvement will consider imaging versus physical therapy.  May need to look into the back as a source.  Less likely to be the hip joint itself.

## 2018-05-21 ENCOUNTER — Ambulatory Visit: Payer: 59 | Admitting: Nurse Practitioner

## 2018-06-02 ENCOUNTER — Encounter: Payer: Self-pay | Admitting: Family Medicine

## 2018-06-03 DIAGNOSIS — Z0279 Encounter for issue of other medical certificate: Secondary | ICD-10-CM

## 2018-06-05 ENCOUNTER — Telehealth: Payer: Self-pay | Admitting: Family Medicine

## 2018-06-05 NOTE — Telephone Encounter (Signed)
Left VM for patient. If she calls back please have her speak with a nurse/CMA and inform that her xray shows degenerative changes in the right hip. We can try an injection into the hip joint. The [facility] can report results to patient.   If any questions then please take the best time and phone number to call and I will try to call her back.   Rosemarie Ax, MD [facility] 06/05/2018, 8:57 AM

## 2018-06-09 ENCOUNTER — Ambulatory Visit: Payer: 59 | Admitting: Orthotics

## 2018-06-09 DIAGNOSIS — M205X1 Other deformities of toe(s) (acquired), right foot: Secondary | ICD-10-CM

## 2018-06-09 DIAGNOSIS — M2021 Hallux rigidus, right foot: Secondary | ICD-10-CM

## 2018-06-09 NOTE — Progress Notes (Signed)
Patient came in today to pick up custom made foot orthotics.  The goals were accomplished and the patient reported no dissatisfaction with said orthotics.  Patient was advised of breakin period and how to report any issues. 

## 2018-06-16 MED FILL — METOPROLOL SUCCINATE ER 100: 100 | 30 days supply | Qty: 30 | Fill #1

## 2018-06-20 ENCOUNTER — Ambulatory Visit (INDEPENDENT_AMBULATORY_CARE_PROVIDER_SITE_OTHER): Payer: 59 | Admitting: Family Medicine

## 2018-06-20 VITALS — BP 140/84 | HR 86 | Resp 16 | Wt 207.0 lb

## 2018-06-20 DIAGNOSIS — G8929 Other chronic pain: Secondary | ICD-10-CM | POA: Diagnosis not present

## 2018-06-20 DIAGNOSIS — M7918 Myalgia, other site: Secondary | ICD-10-CM

## 2018-06-20 NOTE — Progress Notes (Signed)
Shelly Cohen - 57 y.o. female MRN [id number]  Date of birth: [birth year]  SUBJECTIVE:  Including CC & ROS.  No chief complaint on file.   Shelly Cohen is a 57 y.o. female that is ongoing right gluteal pain.  This pain is been ongoing since the summer.  She experienced experiencing this pain after she was placed in a cam walker for a period of time.  The pain is moderate to severe.  The pain is constant.  It is located in the gluteal region.  She is received a greater trochanteric injection with minor improvement of her pain.  She has some problems with walking as she feels off balance.  She denies any radicular symptoms.  Independent review of the right hip x-ray from 10/22 shows degenerative changes in the right hip.   Review of Systems  Constitutional: Negative for fever.  HENT: Negative for congestion.   Respiratory: Negative for cough.   Cardiovascular: Negative for chest pain.  Gastrointestinal: Negative for abdominal pain.  Musculoskeletal: Positive for gait problem.  Skin: Negative for color change.  Neurological: Negative for weakness.  Hematological: Negative for adenopathy.  Psychiatric/Behavioral: Negative for agitation.    HISTORY: Past Medical, Surgical, Social, and Family History Reviewed & Updated per EMR.   Pertinent Historical Findings include:  Past Medical History:  Diagnosis Date  . Alpha thalassemia trait   . Fibroadenoma   . Glaucoma    monitored every 76months  . History of positive PPD, treatment status unknown   . Hypertension   . Thyroid disease     Past Surgical History:  Procedure Laterality Date  . ABDOMINAL HYSTERECTOMY  1989  . BREAST BIOPSY  2013  . BUNIONECTOMY  10/2014  . CHOLECYSTECTOMY  2017  . COLONOSCOPY    . GALLBLADDER SURGERY    . hallux limi    . LAPAROSCOPY    . TUBAL LIGATION      Allergies  Allergen Reactions  . Diphenhydramine Hcl Other (See Comments)  . Hydrocodone Nausea Only  . Azithromycin Itching and  Rash  . Cephalexin Rash  . Penicillin G Rash  . Zolpidem Itching and Rash    Family History  Problem Relation Age of Onset  . Hypertension Mother   . Anemia Mother   . Arthritis Mother        rheumatoid arthritis  . Kidney disease Mother   . Cataracts Mother   . Heart disease Mother   . Hypertension Sister   . Cataracts Sister   . Hypertension Brother   . Glaucoma Brother   . Diabetes Brother   . Cancer Maternal Grandmother        leukemia  . Cancer Maternal Grandfather        lung  . Glaucoma Maternal Aunt      Social History   Socioeconomic History  . Marital status: Married    Spouse name: Not on file  . Number of children: Not on file  . Years of education: Not on file  . Highest education level: Not on file  Occupational History  . Not on file  Social Needs  . Financial resource strain: Not on file  . Food insecurity:    Worry: Not on file    Inability: Not on file  . Transportation needs:    Medical: Not on file    Non-medical: Not on file  Tobacco Use  . Smoking status: Never Smoker  . Smokeless tobacco: Never Used  Substance and Sexual Activity  . Alcohol  use: No  . Drug use: No  . Sexual activity: Yes    Birth control/protection: Surgical  Lifestyle  . Physical activity:    Days per week: Not on file    Minutes per session: Not on file  . Stress: Not on file  Relationships  . Social connections:    Talks on phone: Not on file    Gets together: Not on file    Attends religious service: Not on file    Active member of club or organization: Not on file    Attends meetings of clubs or organizations: Not on file    Relationship status: Not on file  . Intimate partner violence:    Fear of current or ex partner: Not on file    Emotionally abused: Not on file    Physically abused: Not on file    Forced sexual activity: Not on file  Other Topics Concern  . Not on file  Social History Narrative  . Not on file     PHYSICAL EXAM:  VS: BP  140/84   Pulse 86   Resp 16   Wt 207 lb (93.9 kg)   SpO2 98%   BMI 36.67 kg/m  Physical Exam Gen: NAD, alert, cooperative with exam, well-appearing ENT: normal lips, normal nasal mucosa,  Eye: normal EOM, normal conjunctiva and lids CV:  no edema, +2 pedal pulses   Resp: no accessory muscle use, non-labored,  Skin: no rashes, no areas of induration  Neuro: normal tone, normal sensation to touch Psych:  normal insight, alert and oriented MSK:  Right hip: No obvious effusion or swelling. No tenderness to palpation of the greater trochanter. Some clicking with internal rotation. Normal strength resistance with hip flexion. Some pain with FADIR and FABER Neurovascular intact     ASSESSMENT & PLAN:   Chronic gluteal pain Has been dealing with this pain for about 5 to 6 months.  The pain is been ongoing and progressively getting worse.  Is located in the right gluteal region.  Possibly associated with a labral issue.  Could also be associated with the SI joint -MRI of the right hip to evaluate the labrum. -Continue home exercise therapy and supportive care.

## 2018-06-20 NOTE — Patient Instructions (Signed)
Good to see you  You will get a call to set up the MRI  I will after you it has been completed  Happy Holidays

## 2018-06-20 NOTE — Assessment & Plan Note (Signed)
Has been dealing with this pain for about 5 to 6 months.  The pain is been ongoing and progressively getting worse.  Is located in the right gluteal region.  Possibly associated with a labral issue.  Could also be associated with the SI joint -MRI of the right hip to evaluate the labrum. -Continue home exercise therapy and supportive care.

## 2018-07-03 ENCOUNTER — Telehealth: Payer: Self-pay | Admitting: Family Medicine

## 2018-07-03 ENCOUNTER — Ambulatory Visit
Admission: RE | Admit: 2018-07-03 | Discharge: 2018-07-03 | Disposition: A | Discharge status: Home / Self Care | Payer: 59 | Source: Ambulatory Visit | Attending: Family Medicine | Admitting: Family Medicine

## 2018-07-03 DIAGNOSIS — G8929 Other chronic pain: Secondary | ICD-10-CM

## 2018-07-03 DIAGNOSIS — M1611 Unilateral primary osteoarthritis, right hip: Secondary | ICD-10-CM | POA: Diagnosis not present

## 2018-07-03 DIAGNOSIS — M7918 Myalgia, other site: Secondary | ICD-10-CM

## 2018-07-03 NOTE — Telephone Encounter (Signed)
Informed of results.   Rosemarie Ax, MD [facility] 07/03/2018, 2:17 PM

## 2018-07-07 ENCOUNTER — Encounter: Payer: Self-pay | Admitting: Nurse Practitioner

## 2018-07-07 ENCOUNTER — Ambulatory Visit: Payer: Self-pay

## 2018-07-07 ENCOUNTER — Ambulatory Visit (INDEPENDENT_AMBULATORY_CARE_PROVIDER_SITE_OTHER): Payer: 59 | Admitting: Nurse Practitioner

## 2018-07-07 VITALS — BP 180/110 | HR 81 | Temp 97.9°F | Ht 63.0 in | Wt 207.4 lb

## 2018-07-07 DIAGNOSIS — I10 Essential (primary) hypertension: Secondary | ICD-10-CM

## 2018-07-07 DIAGNOSIS — J209 Acute bronchitis, unspecified: Secondary | ICD-10-CM | POA: Diagnosis not present

## 2018-07-07 DIAGNOSIS — I160 Hypertensive urgency: Secondary | ICD-10-CM | POA: Diagnosis not present

## 2018-07-07 MED ORDER — ALBUTEROL SULFATE HFA 108 (90 BASE) MCG/ACT IN AERS: 1.0000 | INHALATION_SPRAY | Freq: Four times a day (QID) | RESPIRATORY_TRACT | 0 refills | Status: DC | PRN

## 2018-07-07 MED ORDER — BENZONATATE 100 MG PO CAPS: 100.0000 mg | ORAL_CAPSULE | Freq: Three times a day (TID) | ORAL | 0 refills | Status: DC | PRN

## 2018-07-07 MED ORDER — METHYLPREDNISOLONE ACETATE 40 MG/ML IJ SUSP
40.0000 mg | Freq: Once | INTRAMUSCULAR | Status: AC
  Administered 2018-07-07: 40 mg via INTRAMUSCULAR

## 2018-07-07 MED FILL — VENTOLIN HFA 90 MCG INHALER: 108 (90 BAS | 25 days supply | Qty: 18 | Fill #0

## 2018-07-07 MED FILL — BENZONATATE 100 MG CAP: 100 | 3 days supply | Qty: 20 | Fill #0

## 2018-07-07 NOTE — Progress Notes (Signed)
Subjective:  Patient ID: Shelly Cohen, female    DOB: 12-Aug-1960  Age: 58 y.o. MRN: [id number]  CC: Follow-up (  BP concerns last BP read was 190/120 at work/ been taking BP meds/ headache)   HPI  URI since 07/02/2018 Use of OTC medication last week: colcidin, mucinex, and antihistamine. Noticed increase in BP 2days later.  Reviewed past Medical, Social and Family history today.  Outpatient Medications Prior to Visit  Medication Sig Dispense Refill  . amLODipine (NORVASC) 2.5 MG tablet Take 1 tablet (2.5 mg total) by mouth daily. 90 tablet 1  . Ascorbic Acid (VITAMIN C PO) Take by mouth.    Marland Kitchen aspirin EC 81 MG tablet Take by mouth.    . Diclofenac Sodium (PENNSAID) 2 % SOLN Place 1 Pump onto the skin 2 (two) times daily as needed. 112 g 1  . ferrous sulfate 325 (65 FE) MG tablet Take by mouth.    . latanoprost (XALATAN) 0.005 % ophthalmic solution   5  . Latanoprostene Bunod (VYZULTA) 0.024 % SOLN Apply 1 drop to eye 2 (two) times daily.    Marland Kitchen levothyroxine (SYNTHROID, LEVOTHROID) 50 MCG tablet Take 1 tablet (50 mcg total) by mouth daily before breakfast. 90 tablet 3  . lidocaine (LIDODERM) 5 % Place 1 patch onto the skin daily. Remove & Discard patch within 12 hours or as directed by MD 15 patch 1  . lisinopril (PRINIVIL,ZESTRIL) 20 MG tablet Take 1 tablet (20 mg total) by mouth daily. 90 tablet 3  . metoprolol succinate (TOPROL-XL) 100 MG 24 hr tablet Take 1 tablet (100 mg total) by mouth daily. Take with or immediately following a meal. 90 tablet 1  . Multiple Vitamin (MULTI-VITAMINS) TABS Take by mouth.    . Omega-3 Fatty Acids (FISH OIL PO) Take by mouth.    . Polyethylene Glycol 3350 (MIRALAX PO) Take by mouth.    . promethazine (PHENERGAN) 25 MG tablet   0   Facility-Administered Medications Prior to Visit  Medication Dose Route Frequency Provider Last Rate Last Dose  . methylPREDNISolone acetate (DEPO-MEDROL) injection 40 mg  40 mg Intramuscular Once Rosemarie Ax,  MD        ROS See HPI  Objective:  BP (!) 180/110   Pulse 81   Temp 97.9 F (36.6 C) (Oral)   Ht 5\' 3"  (1.6 m)   Wt 207 lb 6.4 oz (94.1 kg)   SpO2 97%   BMI 36.74 kg/m   BP Readings from Last 3 Encounters:  07/07/18 (!) 180/110  06/20/18 140/84  05/16/18 134/88    Wt Readings from Last 3 Encounters:  07/07/18 207 lb 6.4 oz (94.1 kg)  06/20/18 207 lb (93.9 kg)  05/16/18 212 lb (96.2 kg)    Physical Exam Vitals signs reviewed.  HENT:     Nose:     Right Sinus: Maxillary sinus tenderness present.     Left Sinus: Maxillary sinus tenderness present.     Mouth/Throat:     Pharynx: Posterior oropharyngeal erythema present.     Tonsils: Swelling: 2+ on the right. 2+ on the left.  Cardiovascular:     Rate and Rhythm: Normal rate and regular rhythm.  Pulmonary:     Effort: Pulmonary effort is normal.     Breath sounds: Normal breath sounds.  Musculoskeletal:     Right lower leg: No edema.     Left lower leg: No edema.  Neurological:     Mental Status: She is alert and oriented  to person, place, and time.  Psychiatric:        Mood and Affect: Mood normal.        Behavior: Behavior normal.        Thought Content: Thought content normal.     Lab Results  Component Value Date   WBC 6.8 04/30/2018   HGB 11.9 (L) 04/30/2018   HCT 37.5 04/30/2018   PLT 287.0 04/30/2018   GLUCOSE 86 04/30/2018   CHOL 167 01/08/2018   TRIG 80.0 01/08/2018   HDL 55.80 01/08/2018   LDLCALC 95 01/08/2018   ALT 13 01/08/2018   AST 15 01/08/2018   NA 139 04/30/2018   K 3.4 (L) 04/30/2018   CL 104 04/30/2018   CREATININE 0.79 04/30/2018   BUN 9 04/30/2018   CO2 27 04/30/2018   TSH 3.39 04/30/2018    Mr Hip Right Wo Contrast  Result Date: 07/03/2018 CLINICAL DATA:  Right hip pain for 5 months EXAM: MR OF THE RIGHT HIP WITHOUT CONTRAST TECHNIQUE: Multiplanar, multisequence MR imaging was performed. No intravenous contrast was administered. COMPARISON:  None. FINDINGS: Bones: No hip  fracture, dislocation or avascular necrosis. No periosteal reaction or bone destruction. No aggressive osseous lesion. Normal sacrum and sacroiliac joints. No SI joint widening or erosive changes. Articular cartilage and labrum Articular cartilage: Extensive full-thickness cartilage loss of the right femoral head and acetabulum with subchondral cystic changes and subchondral marrow edema. Mild left hip cartilage loss. Labrum: Evaluation of the labrum is limited secondary to lack of intra-articular fluid and patient motion degrading image quality. Mild right labral degeneration. Joint or bursal effusion Joint effusion:  No hip joint effusion.  No SI joint effusion. Bursae:  No bursa formation. Muscles and tendons Flexors: Normal. Extensors: Normal. Abductors: Normal. Adductors: Normal. Gluteals: Normal. Hamstrings: Normal. Other findings Miscellaneous: No pelvic free fluid. No fluid collection or hematoma. No inguinal lymphadenopathy. No inguinal hernia. IMPRESSION: 1. Severe osteoarthritis of the right hip. Electronically Signed   By: Kathreen Devoid   On: 07/03/2018 07:55    Assessment & Plan:   Shelly Cohen was seen today for follow-up.  Diagnoses and all orders for this visit:  HTN (hypertension), benign  Hypertensive urgency  Acute bronchitis, unspecified organism -     methylPREDNISolone acetate (DEPO-MEDROL) injection 40 mg -     benzonatate (TESSALON) 100 MG capsule; Take 1-2 capsules (100-200 mg total) by mouth 3 (three) times daily as needed for cough. -     albuterol (PROVENTIL HFA;VENTOLIN HFA) 108 (90 Base) MCG/ACT inhaler; Inhale 1-2 puffs into the lungs every 6 (six) hours as needed.   I am having Shelly Cohen start on benzonatate and albuterol. I am also having her maintain her MULTI-VITAMINS, ferrous sulfate, aspirin EC, Omega-3 Fatty Acids (FISH OIL PO), Polyethylene Glycol 3350 (MIRALAX PO), promethazine, latanoprost, lidocaine, Diclofenac Sodium, levothyroxine, Latanoprostene Bunod,  lisinopril, Ascorbic Acid (VITAMIN C PO), amLODipine, and metoprolol succinate. We will stop administering methylPREDNISolone acetate. Additionally, we will continue to administer methylPREDNISolone acetate.  Meds ordered this encounter  Medications  . methylPREDNISolone acetate (DEPO-MEDROL) injection 40 mg  . benzonatate (TESSALON) 100 MG capsule    Sig: Take 1-2 capsules (100-200 mg total) by mouth 3 (three) times daily as needed for cough.    Dispense:  20 capsule    Refill:  0    Order Specific Question:   Supervising Provider    Answer:   Lucille Passy [[id number]]  . albuterol (PROVENTIL HFA;VENTOLIN HFA) 108 (90 Base) MCG/ACT inhaler  Sig: Inhale 1-2 puffs into the lungs every 6 (six) hours as needed.    Dispense:  1 Inhaler    Refill:  0    Order Specific Question:   Supervising Provider    Answer:   Lucille Passy [[id number]]    Problem List Items Addressed This Visit      Cardiovascular and Mediastinum   HTN (hypertension), benign - Primary    Other Visit Diagnoses    Hypertensive urgency       Acute bronchitis, unspecified organism       Relevant Medications   methylPREDNISolone acetate (DEPO-MEDROL) injection 40 mg (Start on 07/07/2018  5:00 PM)   benzonatate (TESSALON) 100 MG capsule   albuterol (PROVENTIL HFA;VENTOLIN HFA) 108 (90 Base) MCG/ACT inhaler       Follow-up: Return if symptoms worsen or fail to improve.  Wilfred Lacy, NP

## 2018-07-07 NOTE — Telephone Encounter (Signed)
Pt called from work stating that she had the nurse check her BP. It was 190/122 and 200/120.  She denies missing her medication.  She states she has had sinus symptoms and taking BP friendly medications for her symptoms. She denies neurological symptoms. She states she is a little tired. Appointment scheduled per protocol.  Care advice read to patient. Pt verbalized understanding of all instructions  Reason for Disposition . [3] Systolic BP  >= 382 OR Diastolic >= 505  AND [3] having NO cardiac or neurologic symptoms  Answer Assessment - Initial Assessment Questions 1. BLOOD PRESSURE: "What is the blood pressure?" "Did you take at least two measurements 5 minutes apart?"     190/122  200/120 2. ONSET: "When did you take your blood pressure?"     At work 3. HOW: "How did you obtain the blood pressure?" (e.g., visiting nurse, automatic home BP monitor)     Work nurse 4. HISTORY: "Do you have a history of high blood pressure?"     yes 5. MEDICATIONS: "Are you taking any medications for blood pressure?" "Have you missed any doses recently?"     No 6. OTHER SYMPTOMS: "Do you have any symptoms?" (e.g., headache, chest pain, blurred vision, difficulty breathing, weakness)     none 7. PREGNANCY: "Is there any chance you are pregnant?" "When was your last menstrual period?"     N/A  Protocols used: HIGH BLOOD PRESSURE-A-AH

## 2018-07-07 NOTE — Patient Instructions (Signed)
Stay out of work tomorrow.  Take 1tab of amlodipine this evening.  Call office with BP this evening.  Stop all OTC medications.  Maintain adequate oral hydration.

## 2018-07-08 ENCOUNTER — Encounter: Payer: Self-pay | Admitting: Nurse Practitioner

## 2018-07-08 MED FILL — LEVOTHYROXINE 50 MCG TABLET: 50 | 90 days supply | Qty: 90 | Fill #1

## 2018-07-10 ENCOUNTER — Encounter: Payer: Self-pay | Admitting: Nurse Practitioner

## 2018-07-11 ENCOUNTER — Ambulatory Visit: Payer: Self-pay

## 2018-07-11 ENCOUNTER — Encounter: Payer: Self-pay | Admitting: Family Medicine

## 2018-07-11 ENCOUNTER — Ambulatory Visit (INDEPENDENT_AMBULATORY_CARE_PROVIDER_SITE_OTHER): Payer: 59 | Admitting: Family Medicine

## 2018-07-11 VITALS — BP 144/80 | HR 86 | Resp 16 | Ht 63.0 in | Wt 204.0 lb

## 2018-07-11 DIAGNOSIS — M1611 Unilateral primary osteoarthritis, right hip: Secondary | ICD-10-CM

## 2018-07-11 NOTE — Patient Instructions (Signed)
Good to see you  Take tylenol 650 mg three times a day is the best evidence based medicine we have for arthritis.  Glucosamine sulfate 750mg twice a day is a supplement that has been shown to help moderate to severe arthritis. Vitamin D 2000 IU daily Fish oil 2 grams daily.  Tumeric 500mg twice daily.  Capsaicin topically up to four times a day may also help with pain. Please see me back in 3-4 weeks if no better.   

## 2018-07-11 NOTE — Progress Notes (Signed)
Shelly Cohen - 58 y.o. female MRN [id number]  Date of birth: 08-Jan-1961  SUBJECTIVE:  Including CC & ROS.  No chief complaint on file.   Shelly Cohen is a 58 y.o. female that is following up for right buttock and hip pain.  Recent MRI has shown severe degenerative changes of the joint.  She is tried different medications and a greater trochanteric injection with limited benefit.  Her initial pain likely stem from the long time in the boot on her left foot.  This likely exacerbated the underlying arthritis.  Pain is constant and severe.  Denies any radicular symptoms.  Has been ongoing for several months now..   Independent review of the MRI hip from 1/2 shows severe arthritis of the right hip joint.  Review of Systems  Constitutional: Negative for fever.  HENT: Negative for congestion.   Respiratory: Negative for cough.   Cardiovascular: Negative for chest pain.  Gastrointestinal: Negative for abdominal pain.  Musculoskeletal: Positive for arthralgias and gait problem.  Skin: Negative for color change.  Neurological: Negative for weakness.  Hematological: Negative for adenopathy.  Psychiatric/Behavioral: Negative for agitation.    HISTORY: Past Medical, Surgical, Social, and Family History Reviewed & Updated per EMR.   Pertinent Historical Findings include:  Past Medical History:  Diagnosis Date  . Alpha thalassemia trait   . Fibroadenoma   . Glaucoma    monitored every 52months  . History of positive PPD, treatment status unknown   . Hypertension   . Thyroid disease     Past Surgical History:  Procedure Laterality Date  . ABDOMINAL HYSTERECTOMY  1989  . BREAST BIOPSY  2013  . BUNIONECTOMY  10/2014  . CHOLECYSTECTOMY  2017  . COLONOSCOPY    . GALLBLADDER SURGERY    . hallux limi    . LAPAROSCOPY    . TUBAL LIGATION      Allergies  Allergen Reactions  . Diphenhydramine Hcl Other (See Comments)  . Hydrocodone Nausea Only  . Azithromycin Itching and  Rash  . Cephalexin Rash  . Penicillin G Rash  . Zolpidem Itching and Rash    Family History  Problem Relation Age of Onset  . Hypertension Mother   . Anemia Mother   . Arthritis Mother        rheumatoid arthritis  . Kidney disease Mother   . Cataracts Mother   . Heart disease Mother   . Hypertension Sister   . Cataracts Sister   . Hypertension Brother   . Glaucoma Brother   . Diabetes Brother   . Cancer Maternal Grandmother        leukemia  . Cancer Maternal Grandfather        lung  . Glaucoma Maternal Aunt      Social History   Socioeconomic History  . Marital status: Married    Spouse name: Not on file  . Number of children: Not on file  . Years of education: Not on file  . Highest education level: Not on file  Occupational History  . Not on file  Social Needs  . Financial resource strain: Not on file  . Food insecurity:    Worry: Not on file    Inability: Not on file  . Transportation needs:    Medical: Not on file    Non-medical: Not on file  Tobacco Use  . Smoking status: Never Smoker  . Smokeless tobacco: Never Used  Substance and Sexual Activity  . Alcohol use: No  . Drug use:  No  . Sexual activity: Yes    Birth control/protection: Surgical  Lifestyle  . Physical activity:    Days per week: Not on file    Minutes per session: Not on file  . Stress: Not on file  Relationships  . Social connections:    Talks on phone: Not on file    Gets together: Not on file    Attends religious service: Not on file    Active member of club or organization: Not on file    Attends meetings of clubs or organizations: Not on file    Relationship status: Not on file  . Intimate partner violence:    Fear of current or ex partner: Not on file    Emotionally abused: Not on file    Physically abused: Not on file    Forced sexual activity: Not on file  Other Topics Concern  . Not on file  Social History Narrative  . Not on file     PHYSICAL EXAM:  VS: BP  (!) 144/80   Pulse 86   Resp 16   Ht 5\' 3"  (1.6 m)   Wt 204 lb (92.5 kg)   SpO2 98%   BMI 36.14 kg/m  Physical Exam Gen: NAD, alert, cooperative with exam, well-appearing ENT: normal lips, normal nasal mucosa,  Eye: normal EOM, normal conjunctiva and lids CV:  no edema, +2 pedal pulses   Resp: no accessory muscle use, non-labored,  Skin: no rashes, no areas of induration  Neuro: normal tone, normal sensation to touch Psych:  normal insight, alert and oriented MSK:  Right hip:  No TTP of the GT and SI joints Pain with IR  Normal strength to resistance  Negative SLR b/l  Neurovascularly intact.    Aspiration/Injection Procedure Note Shelly Cohen [birth year]  Procedure: Injection Indications: Right hip pain  Procedure Details Consent: Risks of procedure as well as the alternatives and risks of each were explained to the (patient/caregiver).  Consent for procedure obtained. Time Out: Verified patient identification, verified procedure, site/side was marked, verified correct patient position, special equipment/implants available, medications/allergies/relevent history reviewed, required imaging and test results available.  Performed.  The area was cleaned with iodine and alcohol swabs.    The right hip joint was injected using 1 cc's of 40 mg Kenalog and 4 cc's of 0.5% bupivacaine with a 22 3 1/2" needle.  Ultrasound was used. Images were obtained in trans views showing the injection.     A sterile dressing was applied.  Patient did tolerate procedure well.        ASSESSMENT & PLAN:   OA (osteoarthritis) of hip Her pain seems to be stemming from the arthritis of the joint.  Likely related to being in a cam walker and altering her gait for period of time. -Intra-articular injection today. -Counseled on home exercise therapy and supportive care. -If no improvement may need to have a discussion for arthroplasty.

## 2018-07-14 NOTE — Assessment & Plan Note (Signed)
Her pain seems to be stemming from the arthritis of the joint.  Likely related to being in a cam walker and altering her gait for period of time. -Intra-articular injection today. -Counseled on home exercise therapy and supportive care. -If no improvement may need to have a discussion for arthroplasty.

## 2018-07-18 ENCOUNTER — Ambulatory Visit: Payer: Self-pay | Admitting: *Deleted

## 2018-07-18 ENCOUNTER — Emergency Department (HOSPITAL_COMMUNITY)
Admission: EM | Admit: 2018-07-18 | Discharge: 2018-07-18 | Disposition: A | Discharge status: Home / Self Care | Payer: 59 | Attending: Emergency Medicine | Admitting: Emergency Medicine

## 2018-07-18 DIAGNOSIS — I10 Essential (primary) hypertension: Secondary | ICD-10-CM | POA: Diagnosis not present

## 2018-07-18 DIAGNOSIS — E039 Hypothyroidism, unspecified: Secondary | ICD-10-CM | POA: Diagnosis not present

## 2018-07-18 DIAGNOSIS — Z79899 Other long term (current) drug therapy: Secondary | ICD-10-CM | POA: Diagnosis not present

## 2018-07-18 DIAGNOSIS — Z7982 Long term (current) use of aspirin: Secondary | ICD-10-CM | POA: Diagnosis not present

## 2018-07-18 MED ORDER — ACETAMINOPHEN 325 MG PO TABS
650.0000 mg | ORAL_TABLET | Freq: Once | ORAL | Status: AC
  Administered 2018-07-18: 650 mg via ORAL
  Filled 2018-07-18: qty 2

## 2018-07-18 NOTE — ED Notes (Signed)
Pt discharged in good condition. Ambulatory and left with family

## 2018-07-18 NOTE — ED Provider Notes (Signed)
[facility] EMERGENCY DEPARTMENT Provider Note   CSN: [id number] Arrival date & time: 07/18/18  0932   History   Chief Complaint Chief Complaint  Patient presents with  . Hypertension    HPI Shelly Cohen is a 58 y.o. female.  HPI    58 year old female with a past medical history of hypertension presents today with elevation of blood pressure.  Patient notes he was at work when she checked her blood pressure was noted to be in the 008 systolic range.  She thought this was odd as she did not feel like her blood pressure was that high, she was encouraged to come to the emergency room.  She notes she has had some recent difficulty with managing her blood pressure, and has been up into the 676 systolic range as she follows it very closely.  It generally does not reach that high.  She notes she takes 3 medications including lisinopril, Norvasc, metoprolol.  She denies any associated chest pain shortness of breath or neurological deficits, she notes normal vision.  She notes she has been having hip pain and taking Tylenol for this.     Past Medical History:  Diagnosis Date  . Alpha thalassemia trait   . Fibroadenoma   . Glaucoma    monitored every 76months  . History of positive PPD, treatment status unknown   . Hypertension   . Thyroid disease     Patient Active Problem List   Diagnosis Date Noted  . Trigger thumb, left thumb 05/07/2018  . OA (osteoarthritis) of hip 04/22/2018  . Open angle primary glaucoma 01/08/2018  . Mass of breast, left 06/29/2016  . Thalassemia trait, alpha 06/29/2016  . Iron deficiency anemia 06/29/2016  . S/P bunionectomy 06/29/2016  . Great toe pain, right 06/29/2016  . Encounter for weight loss counseling 06/29/2016  . HTN (hypertension), benign 05/18/2016  . Hypothyroidism 05/18/2016    Past Surgical History:  Procedure Laterality Date  . ABDOMINAL HYSTERECTOMY  1989  . BREAST BIOPSY  2013  . BUNIONECTOMY  10/2014  .  CHOLECYSTECTOMY  2017  . COLONOSCOPY    . GALLBLADDER SURGERY    . hallux limi    . LAPAROSCOPY    . TUBAL LIGATION       OB History   No obstetric history on file.      Home Medications    Prior to Admission medications   Medication Sig Start Date End Date Taking? Authorizing Provider  albuterol (PROVENTIL HFA;VENTOLIN HFA) 108 (90 Base) MCG/ACT inhaler Inhale 1-2 puffs into the lungs every 6 (six) hours as needed. 07/07/18   Nche, Charlene Brooke, NP  amLODipine (NORVASC) 2.5 MG tablet Take 1 tablet (2.5 mg total) by mouth daily. 05/07/18   Nche, Charlene Brooke, NP  Ascorbic Acid (VITAMIN C PO) Take by mouth.    [provider]  aspirin EC 81 MG tablet Take by mouth.    [provider]  benzonatate (TESSALON) 100 MG capsule Take 1-2 capsules (100-200 mg total) by mouth 3 (three) times daily as needed for cough. 07/07/18   Nche, Charlene Brooke, NP  Diclofenac Sodium (PENNSAID) 2 % SOLN Place 1 Pump onto the skin 2 (two) times daily as needed. 01/08/18   Nche, Charlene Brooke, NP  ferrous sulfate 325 (65 FE) MG tablet Take by mouth.    [provider]  latanoprost (XALATAN) 0.005 % ophthalmic solution  11/28/17   [provider]  Latanoprostene Bunod (VYZULTA) 0.024 % SOLN Apply 1  drop to eye 2 (two) times daily. 01/23/18   Nche, Charlene Brooke, NP  levothyroxine (SYNTHROID, LEVOTHROID) 50 MCG tablet Take 1 tablet (50 mcg total) by mouth daily before breakfast. 01/08/18   Nche, Charlene Brooke, NP  lidocaine (LIDODERM) 5 % Place 1 patch onto the skin daily. Remove & Discard patch within 12 hours or as directed by MD 01/08/18   Nche, Charlene Brooke, NP  lisinopril (PRINIVIL,ZESTRIL) 20 MG tablet Take 1 tablet (20 mg total) by mouth daily. 04/28/18   Nche, Charlene Brooke, NP  metoprolol succinate (TOPROL-XL) 100 MG 24 hr tablet Take 1 tablet (100 mg total) by mouth daily. Take with or immediately following a meal. 05/07/18   Nche, Charlene Brooke, NP  Multiple Vitamin  (MULTI-VITAMINS) TABS Take by mouth.    [provider]  Omega-3 Fatty Acids (FISH OIL PO) Take by mouth.    [provider]  Polyethylene Glycol 3350 (MIRALAX PO) Take by mouth.    [provider]  promethazine (PHENERGAN) 25 MG tablet  10/09/17   [provider]    Family History Family History  Problem Relation Age of Onset  . Hypertension Mother   . Anemia Mother   . Arthritis Mother        rheumatoid arthritis  . Kidney disease Mother   . Cataracts Mother   . Heart disease Mother   . Hypertension Sister   . Cataracts Sister   . Hypertension Brother   . Glaucoma Brother   . Diabetes Brother   . Cancer Maternal Grandmother        leukemia  . Cancer Maternal Grandfather        lung  . Glaucoma Maternal Aunt     Social History Social History   Tobacco Use  . Smoking status: Never Smoker  . Smokeless tobacco: Never Used  Substance Use Topics  . Alcohol use: No  . Drug use: No     Allergies   Diphenhydramine hcl; Hydrocodone; Azithromycin; Cephalexin; Penicillin g; and Zolpidem   Review of Systems Review of Systems  All other systems reviewed and are negative.    Physical Exam Updated Vital Signs BP (!) 166/82   Pulse 63   Temp (!) 97.5 F (36.4 C) (Oral)   Resp 18   Ht 5\' 3"  (1.6 m)   Wt 91.6 kg   SpO2 100%   BMI 35.78 kg/m   Physical Exam Vitals signs and nursing note reviewed.  Constitutional:      Appearance: She is well-developed.  HENT:     Head: Normocephalic and atraumatic.  Eyes:     General: No scleral icterus.       Right eye: No discharge.        Left eye: No discharge.     Conjunctiva/sclera: Conjunctivae normal.     Pupils: Pupils are equal, round, and reactive to light.  Neck:     Musculoskeletal: Normal range of motion.     Vascular: No JVD.     Trachea: No tracheal deviation.  Cardiovascular:     Rate and Rhythm: Normal rate and regular rhythm.  Pulmonary:     Effort: Pulmonary effort  is normal.     Breath sounds: No stridor.  Neurological:     Mental Status: She is alert and oriented to person, place, and time.     Coordination: Coordination normal.  Psychiatric:        Behavior: Behavior normal.        Thought Content: Thought  content normal.        Judgment: Judgment normal.      ED Treatments / Results  Labs (all labs ordered are listed, but only abnormal results are displayed) Labs Reviewed - No data to display  EKG None  Radiology No results found.  Procedures Procedures (including critical care time)  Medications Ordered in ED Medications  acetaminophen (TYLENOL) tablet 650 mg (650 mg Oral Given 07/18/18 1048)     Initial Impression / Assessment and Plan / ED Course  I have reviewed the triage vital signs and the nursing notes.  Pertinent labs & imaging results that were available during my care of the patient were reviewed by me and considered in my medical decision making (see chart for details).     58 year old female presents today with complaints of hypertension.  She is hypertensive here although I was unable to find any significant readings in the 200 range.  I took her blood pressure manually and got a reading of 160/90.  She was monitored here with no significant changes, no signs of endorgan damage.  Patient will be discharged with outpatient follow-up with her primary care for ongoing evaluation and management, she is given strict return precautions.  She verbalized understanding and agreement to today's plan had no further questions or concerns.  Final Clinical Impressions(s) / ED Diagnoses   Final diagnoses:  Hypertension, unspecified type    ED Discharge Orders    None       Okey Regal, PA-C 07/18/18 [place], MD 07/19/18 [phone]

## 2018-07-18 NOTE — ED Triage Notes (Signed)
Pt pt she has been having HBP for the last couple months and been going to her PCP to get her BP meds adjusted.  Her BP remains high so she came in today for follow up.  Pt complains of 6/10 headache and state last BP at work today was 252 118.  Aox4 NAD noted at this time.

## 2018-07-18 NOTE — Telephone Encounter (Signed)
   Reason for Disposition . [3] Systolic BP  >= 212 OR Diastolic >= 248 AND [2] cardiac or neurologic symptoms (e.g., chest pain, difficulty breathing, unsteady gait, blurred vision)  Answer Assessment - Initial Assessment Questions 1. BLOOD PRESSURE: "What is the blood pressure?" "Did you take at least two measurements 5 minutes apart?"     230/120,  252/118 2. ONSET: "When did you take your blood pressure?"     9:30 3. HOW: "How did you obtain the blood pressure?" (e.g., visiting nurse, automatic home BP monitor)     manual 4. HISTORY: "Do you have a history of high blood pressure?"     yes 5. MEDICATIONS: "Are you taking any medications for blood pressure?" "Have you missed any doses recently?"     Yes- no missed doses 6. OTHER SYMPTOMS: "Do you have any symptoms?" (e.g., headache, chest pain, blurred vision, difficulty breathing, weakness)     Slight headache, does not feel well 7. PREGNANCY: "Is there any chance you are pregnant?" "When was your last menstrual period?"     n/a  Protocols used: HIGH BLOOD PRESSURE-A-AH

## 2018-07-18 NOTE — Discharge Instructions (Addendum)
Please read attached information. If you experience any new or worsening signs or symptoms please return to the emergency room for evaluation. Please follow-up with your primary care provider or specialist as discussed.  Please use previous prescribed medication as directed.

## 2018-07-20 ENCOUNTER — Encounter: Payer: Self-pay | Admitting: Nurse Practitioner

## 2018-07-20 DIAGNOSIS — R51 Headache: Principal | ICD-10-CM

## 2018-07-20 DIAGNOSIS — R519 Headache, unspecified: Secondary | ICD-10-CM

## 2018-07-21 ENCOUNTER — Ambulatory Visit (INDEPENDENT_AMBULATORY_CARE_PROVIDER_SITE_OTHER): Payer: 59 | Admitting: Family Medicine

## 2018-07-21 ENCOUNTER — Encounter: Payer: Self-pay | Admitting: Family Medicine

## 2018-07-21 ENCOUNTER — Ambulatory Visit: Payer: Self-pay

## 2018-07-21 VITALS — BP 200/100 | HR 80 | Temp 97.8°F | Ht 63.0 in | Wt 206.0 lb

## 2018-07-21 DIAGNOSIS — I160 Hypertensive urgency: Secondary | ICD-10-CM | POA: Diagnosis not present

## 2018-07-21 DIAGNOSIS — E876 Hypokalemia: Secondary | ICD-10-CM

## 2018-07-21 DIAGNOSIS — I10 Essential (primary) hypertension: Secondary | ICD-10-CM

## 2018-07-21 MED ORDER — AMLODIPINE BESYLATE 10 MG PO TABS: 10.0000 mg | ORAL_TABLET | Freq: Every day | ORAL | 3 refills | Status: DC

## 2018-07-21 MED FILL — METOPROLOL SUCCINATE ER 100: 100 | 30 days supply | Qty: 30 | Fill #2

## 2018-07-21 NOTE — Telephone Encounter (Signed)
Dr. Loletha Grayer please advise Can you see the pt today or for her to go back to ED?

## 2018-07-21 NOTE — Telephone Encounter (Signed)
Do not think headache is new but I will confirm with the pt as well.   Left vm for the pt to call back, need to schedule appt with Dr. Bryan Lemma today.

## 2018-07-21 NOTE — Progress Notes (Signed)
Chief Complaint  Patient presents with  . Follow-up    BP problems 190/120 at ED/ headaches    HPI: Shelly Cohen is a 58 y.o. female here for HTN follow-up. Pt was seen in ER on 07/18/18 for uncontrolled HTN (was given tylenol and discharged) and this has been on going issue for pt. She complaints of headache. She denies CP, SOB, vision changes (she does have h/o glaucoma B/L). She notes feeling lightheaded at night.   Pt is on lisinopril 20mg  daily in AM, norvasc 2.5mg  daily in AM, and metoprolol 100mg  daily in PM. She is taking these daily as directed. The amlodipine was most recently added. She was on lisinopril/HCTZ with good control of her BP but was taking potassium supplement d/t low K. PCP d/c'd this med (pt isn't certain as to reason for this, ? Persistent hypokalemia) and pts states since that time, BP has been elevated and difficult to control.   Renal artery US done 05/2018 and normal. Also, PCP had ordered aldosterone, renin lab in 04/2018 but this does not appear to have been drawn. Will do today. Pt requests referral to cardiology.   Her home BP readings are below: 178/98, 173/97, 158/88, 169/83, 169/89  BP Readings from Last 3 Encounters:  07/21/18 (!) 200/100  07/18/18 (!) 166/82  07/11/18 (!) 144/80   Lab Results  Component Value Date   CREATININE 0.79 04/30/2018   BUN 9 04/30/2018   NA 139 04/30/2018   K 3.4 (L) 04/30/2018   CL 104 04/30/2018   CO2 27 04/30/2018    Past Medical History:  Diagnosis Date  . Alpha thalassemia trait   . Fibroadenoma   . Glaucoma    monitored every 27months  . History of positive PPD, treatment status unknown   . Hypertension   . Thyroid disease     Past Surgical History:  Procedure Laterality Date  . ABDOMINAL HYSTERECTOMY  1989  . BREAST BIOPSY  2013  . BUNIONECTOMY  10/2014  . CHOLECYSTECTOMY  2017  . COLONOSCOPY    . GALLBLADDER SURGERY    . hallux limi    . LAPAROSCOPY    . TUBAL LIGATION      Social  History   Socioeconomic History  . Marital status: Married    Spouse name: Not on file  . Number of children: Not on file  . Years of education: Not on file  . Highest education level: Not on file  Occupational History  . Not on file  Social Needs  . Financial resource strain: Not on file  . Food insecurity:    Worry: Not on file    Inability: Not on file  . Transportation needs:    Medical: Not on file    Non-medical: Not on file  Tobacco Use  . Smoking status: Never Smoker  . Smokeless tobacco: Never Used  Substance and Sexual Activity  . Alcohol use: No  . Drug use: No  . Sexual activity: Yes    Birth control/protection: Surgical  Lifestyle  . Physical activity:    Days per week: Not on file    Minutes per session: Not on file  . Stress: Not on file  Relationships  . Social connections:    Talks on phone: Not on file    Gets together: Not on file    Attends religious service: Not on file    Active member of club or organization: Not on file    Attends meetings of clubs or  organizations: Not on file    Relationship status: Not on file  . Intimate partner violence:    Fear of current or ex partner: Not on file    Emotionally abused: Not on file    Physically abused: Not on file    Forced sexual activity: Not on file  Other Topics Concern  . Not on file  Social History Narrative  . Not on file    Family History  Problem Relation Age of Onset  . Hypertension Mother   . Anemia Mother   . Arthritis Mother        rheumatoid arthritis  . Kidney disease Mother   . Cataracts Mother   . Heart disease Mother   . Hypertension Sister   . Cataracts Sister   . Hypertension Brother   . Glaucoma Brother   . Diabetes Brother   . Cancer Maternal Grandmother        leukemia  . Cancer Maternal Grandfather        lung  . Glaucoma Maternal Aunt      Immunization History  Administered Date(s) Administered  . Influenza-Unspecified 03/19/2016, 04/01/2017, 03/19/2018    . Tdap 12/15/2015, 12/26/2017    Outpatient Encounter Medications as of 07/21/2018  Medication Sig Note  . albuterol (PROVENTIL HFA;VENTOLIN HFA) 108 (90 Base) MCG/ACT inhaler Inhale 1-2 puffs into the lungs every 6 (six) hours as needed.   Marland Kitchen amLODipine (NORVASC) 2.5 MG tablet Take 1 tablet (2.5 mg total) by mouth daily.   . Ascorbic Acid (VITAMIN C PO) Take by mouth.   Marland Kitchen aspirin EC 81 MG tablet Take by mouth. 05/18/2016: Received from: [place]: Take 81 mg by mouth once daily.  . benzonatate (TESSALON) 100 MG capsule Take 1-2 capsules (100-200 mg total) by mouth 3 (three) times daily as needed for cough.   . Diclofenac Sodium (PENNSAID) 2 % SOLN Place 1 Pump onto the skin 2 (two) times daily as needed.   . ferrous sulfate 325 (65 FE) MG tablet Take by mouth. 05/18/2016: Received from: [place]: Take 325 mg by mouth daily with breakfast.  . latanoprost (XALATAN) 0.005 % ophthalmic solution    . Latanoprostene Bunod (VYZULTA) 0.024 % SOLN Apply 1 drop to eye 2 (two) times daily.   Marland Kitchen levothyroxine (SYNTHROID, LEVOTHROID) 50 MCG tablet Take 1 tablet (50 mcg total) by mouth daily before breakfast.   . lidocaine (LIDODERM) 5 % Place 1 patch onto the skin daily. Remove & Discard patch within 12 hours or as directed by MD   . lisinopril (PRINIVIL,ZESTRIL) 20 MG tablet Take 1 tablet (20 mg total) by mouth daily.   . metoprolol succinate (TOPROL-XL) 100 MG 24 hr tablet Take 1 tablet (100 mg total) by mouth daily. Take with or immediately following a meal.   . Multiple Vitamin (MULTI-VITAMINS) TABS Take by mouth. 05/18/2016: Received from: [place]: Take 1 tablet by mouth once daily.  . Omega-3 Fatty Acids (FISH OIL PO) Take by mouth. 05/18/2016: Received from: [place]: Take 1 capsule by mouth.  . Polyethylene Glycol 3350 (MIRALAX PO) Take by mouth.   . promethazine  (PHENERGAN) 25 MG tablet     No facility-administered encounter medications on file as of 07/21/2018.      ROS: Pertinent positives and negatives noted in HPI. Remainder of ROS non-contributory    Allergies  Allergen Reactions  . Diphenhydramine Hcl Other (See Comments)  .  Hydrocodone Nausea Only  . Azithromycin Itching and Rash  . Cephalexin Rash  . Penicillin G Rash  . Zolpidem Itching and Rash    BP (!) 200/100   Pulse 80   Temp 97.8 F (36.6 C) (Oral)   Ht 5\' 3"  (1.6 m)   Wt 206 lb (93.4 kg)   SpO2 98%   BMI 36.49 kg/m  BP Readings from Last 3 Encounters:  07/21/18 (!) 200/100  07/18/18 (!) 166/82  07/11/18 (!) 144/80    Physical Exam  Constitutional: She is oriented to person, place, and time. She appears well-developed and well-nourished. No distress.  HENT:  Head: Normocephalic and atraumatic.  Eyes: Pupils are equal, round, and reactive to light.  Neck: Neck supple. No JVD present. No thyromegaly present.  Cardiovascular: Normal rate, regular rhythm and normal heart sounds.  Musculoskeletal:        General: No edema.  Neurological: She is alert and oriented to person, place, and time.  Psychiatric: She has a normal mood and affect. Her behavior is normal.     A/P:  1. HTN (hypertension), benign - uncontrolled and pt with persistent headache Increase: - amLODipine (NORVASC) 10 MG tablet; Take 1 tablet (10 mg total) by mouth daily.  Dispense: 90 tablet; Refill: 3 - increased from 2.5mg  daily Continue: - lisinopril 20mg  daily and metoprolol 100mg  daily - Ambulatory referral to Cardiology - Aldosterone + renin activity w/ ratio - previously ordered by PCP - if BP not improved significantly with increased dose of norvasc, would increase lisinopril and/or considering adding back diuretic. - pt will send MyChart message to PCP in 1-1.5 wks with BP readings and had f/u appt schedule in 3 wks Discussed plan and reviewed medications with patient, including  risks, benefits, and potential side effects. Pt expressed understand. All questions answered.  I spent 25 min with the patient today and greater than 50% was spent in counseling, coordination of care, education

## 2018-07-21 NOTE — Patient Instructions (Signed)
Referral to cardiology placed Increase amlodipine (norvasc) to 10mg  daily, new Rx printed for you Cont lisinopril 20mg  daily and metoprolol 100mg  Send mychart message in 1-1.5 wks with BP readings to [place] or myself

## 2018-07-21 NOTE — Telephone Encounter (Signed)
Is the patient's headache new since being seen in the ER on 07/18/18? If it is new, then she needs to go back to ER for eval of HTN urgency. If it is not, then she can be scheduled with me this afternoon.

## 2018-07-21 NOTE — Telephone Encounter (Signed)
Pt c/o BP 173/98, 172/100 to the left arm and 174/102 to the right arm and a constant headache. Headache pain is constant and a 6 out of 10 on the pain scale. Pt states the headache is located to the front part of the head near her eyes.  Pt took 2 extra strength Tylenol with no relief.  Pt was seen in the ED January 17 th for her hypertension. Called PCP office to discuss disposition. Per protocol pt is to go to the ED for evaluation. Called PCP office and spoke with Laurey Arrow who asked NT to send a high priority note back to office to discuss with Dr Bryan Lemma. Laurey Arrow stated she will then call the pt for advice.  Reason for Disposition . [8] Systolic BP  >= 657 OR Diastolic >= 846 AND [9] cardiac or neurologic symptoms (e.g., chest pain, difficulty breathing, unsteady gait, blurred vision)    headache  Answer Assessment - Initial Assessment Questions 1. BLOOD PRESSURE: "What is the blood pressure?" "Did you take at least two measurements 5 minutes apart?"     173/98  172/100 left arm  174/102 right arm 2. ONSET: "When did you take your blood pressure?"     0530  Rechecked during triage call 3. HOW: "How did you obtain the blood pressure?" (e.g., visiting nurse, automatic home BP monitor)    Automatic BP machine and second one taken with manually 4. HISTORY: "Do you have a history of high blood pressure?"     yes 5. MEDICATIONS: "Are you taking any medications for blood pressure?" "Have you missed any doses recently?"     Yes- no 6. OTHER SYMPTOMS: "Do you have any symptoms?" (e.g., headache, chest pain, blurred vision, difficulty breathing, weakness)    Headache 6/10 front part of head near eyes. Took 2 extra strength Tylenol for the headache. Did not help with the pain.  7. PREGNANCY: "Is there any chance you are pregnant?" "When was your last menstrual period?"     n/a  Protocols used: HIGH BLOOD PRESSURE-A-AH

## 2018-07-21 NOTE — Telephone Encounter (Signed)
appt made with Dr. Loletha Grayer today.

## 2018-07-22 ENCOUNTER — Ambulatory Visit: Payer: 59 | Admitting: Nurse Practitioner

## 2018-07-22 MED ORDER — TRAMADOL-ACETAMINOPHEN 37.5-325 MG PO TABS: 1.0000 | ORAL_TABLET | Freq: Three times a day (TID) | ORAL | 0 refills | Status: AC | PRN

## 2018-07-22 MED FILL — AMLODIPINE BESYLATE 10 MG T: 10 | 90 days supply | Qty: 90 | Fill #0

## 2018-07-25 ENCOUNTER — Encounter: Payer: Self-pay | Admitting: Nurse Practitioner

## 2018-07-26 LAB — ALDOSTERONE + RENIN ACTIVITY W/ RATIO
ALDO / PRA RATIO: 12.5 ratio (ref 0.9–28.9)
Aldosterone: 2 ng/dL
RENIN ACTIVITY: 0.16 ng/mL/h — AB (ref 0.25–5.82)

## 2018-08-03 ENCOUNTER — Encounter: Payer: Self-pay | Admitting: Family Medicine

## 2018-08-07 ENCOUNTER — Ambulatory Visit (INDEPENDENT_AMBULATORY_CARE_PROVIDER_SITE_OTHER): Payer: Commercial Managed Care - PPO | Admitting: Nurse Practitioner

## 2018-08-07 ENCOUNTER — Encounter: Payer: Self-pay | Admitting: Nurse Practitioner

## 2018-08-07 VITALS — BP 162/82 | HR 78 | Temp 98.4°F | Ht 63.0 in | Wt 203.0 lb

## 2018-08-07 DIAGNOSIS — M1611 Unilateral primary osteoarthritis, right hip: Secondary | ICD-10-CM | POA: Diagnosis not present

## 2018-08-07 DIAGNOSIS — I10 Essential (primary) hypertension: Secondary | ICD-10-CM | POA: Diagnosis not present

## 2018-08-07 DIAGNOSIS — E039 Hypothyroidism, unspecified: Secondary | ICD-10-CM | POA: Diagnosis not present

## 2018-08-07 DIAGNOSIS — I160 Hypertensive urgency: Secondary | ICD-10-CM

## 2018-08-07 LAB — BASIC METABOLIC PANEL
BUN: 12 mg/dL (ref 6–23)
CALCIUM: 9.7 mg/dL (ref 8.4–10.5)
CO2: 28 meq/L (ref 19–32)
Chloride: 105 mEq/L (ref 96–112)
Creatinine, Ser: 0.71 mg/dL (ref 0.40–1.20)
GFR: 102.62 mL/min (ref >60.00)
GLUCOSE: 82 mg/dL (ref 70–99)
Potassium: 3.6 mEq/L (ref 3.5–5.1)
SODIUM: 141 meq/L (ref 135–145)

## 2018-08-07 LAB — T4, FREE: Free T4: 1.14 ng/dL (ref 0.60–1.60)

## 2018-08-07 LAB — TSH: TSH: 1.8 u[IU]/mL (ref 0.35–4.50)

## 2018-08-07 MED ORDER — CYCLOBENZAPRINE HCL 5 MG PO TABS: 5.0000 mg | ORAL_TABLET | Freq: Every day | ORAL | 0 refills | Status: DC

## 2018-08-07 MED ORDER — HYDROCODONE-ACETAMINOPHEN 5-325 MG PO TABS: 1.0000 | ORAL_TABLET | Freq: Four times a day (QID) | ORAL | 0 refills | Status: AC | PRN

## 2018-08-07 MED ORDER — LISINOPRIL 40 MG PO TABS: 40.0000 mg | ORAL_TABLET | Freq: Every day | ORAL | 1 refills | Status: DC

## 2018-08-07 MED ORDER — DOCUSATE SODIUM 100 MG PO CAPS: 100.0000 mg | ORAL_CAPSULE | Freq: Two times a day (BID) | ORAL | 0 refills | Status: DC

## 2018-08-07 MED FILL — CYCLOBENZAPRINE HCL 5 MG TA: 5 | 14 days supply | Qty: 14 | Fill #0

## 2018-08-07 MED FILL — LISINOPRIL 40 MG TABLET: 40 | 90 days supply | Qty: 90 | Fill #0

## 2018-08-07 MED FILL — HYDROCODON-APAP 5-325: 5-325 | 3 days supply | Qty: 12 | Fill #0

## 2018-08-07 NOTE — Assessment & Plan Note (Signed)
Waxing and waning BP with amlodipine, lisinopril and metoprolol. Persistent headache.  Home BP readings 1/29-2/6: 138-169/80-89. Normal renal duplex, negative renin-angiotensin. Upcoming appt with cardiology 08/22/2018.  Increased lisinopril to 40mg  today. Ordered sleep study, PTH, catecholamine and metanephrine today. F/up in 1week

## 2018-08-07 NOTE — Addendum Note (Signed)
Addended by: Leana Gamer on: 08/07/2018 04:49 PM   Modules accepted: Orders

## 2018-08-07 NOTE — Assessment & Plan Note (Signed)
Entered referral to ortho. Hydrocodone and flexeril given today. Use of cane to ambulate

## 2018-08-07 NOTE — Patient Instructions (Addendum)
Stable thyroid function and renal function. Pending test to evaluate adrenal gland and parathyroid.  Use hydrocodone for severe right hip pain.  Alternate with tylenol 500mg  every 8hrs as needed. Start colace 100mg  BID to prevent constipation. You will be contacted to schedule appt with ortho.  Maintain appt with cardiology. You will be contacted to schedule appt with neurology for possible sleep study.  Increase lisinopril to 40mg . New rx sent F/up in 1week.

## 2018-08-07 NOTE — Progress Notes (Addendum)
Subjective:  Patient ID: Shelly Cohen, female    DOB: 06/18/1961  Age: 58 y.o. MRN: [id number]  CC: Follow-up (follow up on BP and hip pain consult. has BP reading. )  HPI  Home BP readings 1/29-2/6: 138-169/80-89. Persistent headache, no change in vision, no chest pain, no paresthesia, no SOB or PND, no edema. BP Readings from Last 3 Encounters:  08/07/18 (!) 162/82  07/21/18 (!) 200/100  07/18/18 (!) 166/82   Severe Right Hip pain secondary to severe OA per MRI. Worsening hip pain. No improvement with tylenol and joint injection. Use of cane at this time. Difficulty with ambulation and prolonged standing. Unable to perform duties at work. Will like referral to ortho.  Reviewed past Medical, Social and Family history today.  Outpatient Medications Prior to Visit  Medication Sig Dispense Refill  . albuterol (PROVENTIL HFA;VENTOLIN HFA) 108 (90 Base) MCG/ACT inhaler Inhale 1-2 puffs into the lungs every 6 (six) hours as needed. 1 Inhaler 0  . amLODipine (NORVASC) 10 MG tablet Take 1 tablet (10 mg total) by mouth daily. 90 tablet 3  . Ascorbic Acid (VITAMIN C PO) Take by mouth.    Marland Kitchen aspirin EC 81 MG tablet Take by mouth.    . Diclofenac Sodium (PENNSAID) 2 % SOLN Place 1 Pump onto the skin 2 (two) times daily as needed. 112 g 1  . ferrous sulfate 325 (65 FE) MG tablet Take by mouth.    . latanoprost (XALATAN) 0.005 % ophthalmic solution   5  . Latanoprostene Bunod (VYZULTA) 0.024 % SOLN Apply 1 drop to eye 2 (two) times daily.    Marland Kitchen levothyroxine (SYNTHROID, LEVOTHROID) 50 MCG tablet Take 1 tablet (50 mcg total) by mouth daily before breakfast. 90 tablet 3  . lidocaine (LIDODERM) 5 % Place 1 patch onto the skin daily. Remove & Discard patch within 12 hours or as directed by MD 15 patch 1  . metoprolol succinate (TOPROL-XL) 100 MG 24 hr tablet Take 1 tablet (100 mg total) by mouth daily. Take with or immediately following a meal. 90 tablet 1  . Multiple Vitamin  (MULTI-VITAMINS) TABS Take by mouth.    . Omega-3 Fatty Acids (FISH OIL PO) Take by mouth.    . Polyethylene Glycol 3350 (MIRALAX PO) Take by mouth.    Marland Kitchen lisinopril (PRINIVIL,ZESTRIL) 20 MG tablet Take 1 tablet (20 mg total) by mouth daily. 90 tablet 3  . promethazine (PHENERGAN) 25 MG tablet   0  . benzonatate (TESSALON) 100 MG capsule Take 1-2 capsules (100-200 mg total) by mouth 3 (three) times daily as needed for cough. (Patient not taking: Reported on 08/07/2018) 20 capsule 0   No facility-administered medications prior to visit.     ROS See HPI  Objective:  BP (!) 162/82   Pulse 78   Temp 98.4 F (36.9 C) (Oral)   Ht 5\' 3"  (1.6 m)   Wt 203 lb (92.1 kg)   SpO2 99%   BMI 35.96 kg/m   BP Readings from Last 3 Encounters:  08/07/18 (!) 162/82  07/21/18 (!) 200/100  07/18/18 (!) 166/82    Wt Readings from Last 3 Encounters:  08/07/18 203 lb (92.1 kg)  07/21/18 206 lb (93.4 kg)  07/18/18 202 lb (91.6 kg)    Physical Exam Neck:     Musculoskeletal: Normal range of motion and neck supple.  Cardiovascular:     Rate and Rhythm: Normal rate and regular rhythm.  Pulmonary:     Effort: Pulmonary effort is  normal.     Breath sounds: Normal breath sounds.  Musculoskeletal:        General: Tenderness present. No swelling or deformity.     Right lower leg: No edema.     Left lower leg: No edema.     Comments: Arthralgic gait with use of cane  Neurological:     Mental Status: She is alert and oriented to person, place, and time.     Lab Results  Component Value Date   WBC 6.8 04/30/2018   HGB 11.9 (L) 04/30/2018   HCT 37.5 04/30/2018   PLT 287.0 04/30/2018   GLUCOSE 82 08/07/2018   CHOL 167 01/08/2018   TRIG 80.0 01/08/2018   HDL 55.80 01/08/2018   LDLCALC 95 01/08/2018   ALT 13 01/08/2018   AST 15 01/08/2018   NA 141 08/07/2018   K 3.6 08/07/2018   CL 105 08/07/2018   CREATININE 0.71 08/07/2018   BUN 12 08/07/2018   CO2 28 08/07/2018   TSH 1.80 08/07/2018     No results found.  Assessment & Plan:   Shelly Cohen was seen today for follow-up.  Diagnoses and all orders for this visit:  Hypothyroidism, unspecified type -     Basic metabolic panel -     TSH -     T4, free  Hypertensive urgency -     PTH, Intact and Calcium -     Basic metabolic panel -     Ambulatory referral to Sleep Studies -     Catecholamines, fractionated, plasma -     Metanephrines, plasma  Primary osteoarthritis of right hip -     HYDROcodone-acetaminophen (NORCO/VICODIN) 5-325 MG tablet; Take 1 tablet by mouth every 6 (six) hours as needed for up to 3 days for moderate pain. -     docusate sodium (COLACE) 100 MG capsule; Take 1 capsule (100 mg total) by mouth 2 (two) times daily. -     Ambulatory referral to Orthopedic Surgery -     cyclobenzaprine (FLEXERIL) 5 MG tablet; Take 1 tablet (5 mg total) by mouth at bedtime.  HTN (hypertension), benign -     lisinopril (PRINIVIL,ZESTRIL) 40 MG tablet; Take 1 tablet (40 mg total) by mouth daily.   I have discontinued Tarah Harris-Coley's promethazine. I have also changed her lisinopril. Additionally, I am having her start on HYDROcodone-acetaminophen, docusate sodium, and cyclobenzaprine. Lastly, I am having her maintain her MULTI-VITAMINS, ferrous sulfate, aspirin EC, Omega-3 Fatty Acids (FISH OIL PO), Polyethylene Glycol 3350 (MIRALAX PO), latanoprost, lidocaine, Diclofenac Sodium, levothyroxine, Latanoprostene Bunod, Ascorbic Acid (VITAMIN C PO), metoprolol succinate, benzonatate, albuterol, and amLODipine.  Meds ordered this encounter  Medications  . HYDROcodone-acetaminophen (NORCO/VICODIN) 5-325 MG tablet    Sig: Take 1 tablet by mouth every 6 (six) hours as needed for up to 3 days for moderate pain.    Dispense:  12 tablet    Refill:  0    Order Specific Question:   Supervising Provider    Answer:   Lucille Passy [[id number]]  . docusate sodium (COLACE) 100 MG capsule    Sig: Take 1 capsule (100 mg total) by mouth 2  (two) times daily.    Dispense:  10 capsule    Refill:  0    Order Specific Question:   Supervising Provider    Answer:   Lucille Passy [[id number]]  . lisinopril (PRINIVIL,ZESTRIL) 40 MG tablet    Sig: Take 1 tablet (40 mg total) by mouth daily.  Dispense:  90 tablet    Refill:  1    Order Specific Question:   Supervising Provider    Answer:   Lucille Passy [[id number]]  . cyclobenzaprine (FLEXERIL) 5 MG tablet    Sig: Take 1 tablet (5 mg total) by mouth at bedtime.    Dispense:  14 tablet    Refill:  0    Order Specific Question:   Supervising Provider    Answer:   Lucille Passy [[id number]]    Problem List Items Addressed This Visit      Cardiovascular and Mediastinum   HTN (hypertension), benign    Waxing and waning BP with amlodipine, lisinopril and metoprolol. Persistent headache.  Home BP readings 1/29-2/6: 138-169/80-89. Normal renal duplex, negative renin-angiotensin. Upcoming appt with cardiology 08/22/2018.  Increased lisinopril to 40mg  today. Ordered sleep study, PTH, catecholamine and metanephrine today. F/up in 1week      Relevant Medications   lisinopril (PRINIVIL,ZESTRIL) 40 MG tablet     Endocrine   Hypothyroidism - Primary   Relevant Orders   Basic metabolic panel (Completed)   TSH (Completed)   T4, free (Completed)     Musculoskeletal and Integument   OA (osteoarthritis) of hip    Entered referral to ortho. Hydrocodone and flexeril given today. Use of cane to ambulate      Relevant Medications   HYDROcodone-acetaminophen (NORCO/VICODIN) 5-325 MG tablet   docusate sodium (COLACE) 100 MG capsule   cyclobenzaprine (FLEXERIL) 5 MG tablet   Other Relevant Orders   Ambulatory referral to Orthopedic Surgery    Other Visit Diagnoses    Hypertensive urgency       Relevant Medications   lisinopril (PRINIVIL,ZESTRIL) 40 MG tablet   Other Relevant Orders   PTH, Intact and Calcium   Basic metabolic panel (Completed)   Ambulatory referral to Sleep Studies    Catecholamines, fractionated, plasma   Metanephrines, plasma      Follow-up: Return in about 1 week (around 08/14/2018) for HTN (42mins).  Wilfred Lacy, NP

## 2018-08-08 ENCOUNTER — Ambulatory Visit: Payer: 59 | Admitting: Nurse Practitioner

## 2018-08-15 ENCOUNTER — Ambulatory Visit (INDEPENDENT_AMBULATORY_CARE_PROVIDER_SITE_OTHER): Payer: 59 | Admitting: Nurse Practitioner

## 2018-08-15 ENCOUNTER — Encounter: Payer: Self-pay | Admitting: Nurse Practitioner

## 2018-08-15 DIAGNOSIS — M1611 Unilateral primary osteoarthritis, right hip: Secondary | ICD-10-CM | POA: Diagnosis not present

## 2018-08-15 DIAGNOSIS — E039 Hypothyroidism, unspecified: Secondary | ICD-10-CM | POA: Diagnosis not present

## 2018-08-15 DIAGNOSIS — I10 Essential (primary) hypertension: Secondary | ICD-10-CM

## 2018-08-15 LAB — METANEPHRINES, PLASMA
Metanephrine, Free: 44 pg/mL (ref <=57)
Normetanephrine, Free: 52 pg/mL (ref <=148)
Total Metanephrines-Plasma: 96 pg/mL (ref <=205)

## 2018-08-15 LAB — CATECHOLAMINES, FRACTIONATED, PLASMA
Catecholamines, Total: 638 pg/mL
Norepinephrine: 638 pg/mL

## 2018-08-15 LAB — PTH, INTACT AND CALCIUM
Calcium: 9.4 mg/dL (ref 8.6–10.4)
PTH: 30 pg/mL (ref 14–64)

## 2018-08-15 MED ORDER — HYDROCODONE-ACETAMINOPHEN 5-325 MG PO TABS: 0.5000 | ORAL_TABLET | Freq: Two times a day (BID) | ORAL | 0 refills | Status: AC | PRN

## 2018-08-15 MED ORDER — CYCLOBENZAPRINE HCL 5 MG PO TABS: 5.0000 mg | ORAL_TABLET | Freq: Every evening | ORAL | 0 refills | Status: DC | PRN

## 2018-08-15 MED ORDER — METOPROLOL SUCCINATE ER 100 MG PO TB24: 100.0000 mg | ORAL_TABLET | Freq: Every day | ORAL | 3 refills | Status: DC

## 2018-08-15 MED ORDER — LISINOPRIL 40 MG PO TABS: 40.0000 mg | ORAL_TABLET | Freq: Every day | ORAL | 3 refills | Status: DC

## 2018-08-15 NOTE — Progress Notes (Signed)
Subjective:  Patient ID: Shelly Cohen, female    DOB: 14-Jul-1960  Age: 58 y.o. MRN: [id number]  CC: Hypertension (pt here for HTN follow up, she has been keeping a log of her BPs for 1 week (range from 132/76 - 152/91)  and she has been taking her meds daily)  HPI  Hip pain secondary to severe OA: Improved pain with Use of flexeril at bedtime and 0.5tab norco in morning Has upcoming appt with ortho 08/18/2018  HTN: Improved with amlodipine, lisinopril, and metoprolol. Denies any adverse effects. Upcoming appt with cardiology 08/22/2018. Negative renal US and adrenal gland dysfunction. appt with neurology to discuss sleep study 09/16/2018. BP Readings from Last 3 Encounters:  08/15/18 132/76  08/07/18 (!) 162/82  07/21/18 (!) 200/100   Reviewed past Medical, Social and Family history today.  Outpatient Medications Prior to Visit  Medication Sig Dispense Refill  . albuterol (PROVENTIL HFA;VENTOLIN HFA) 108 (90 Base) MCG/ACT inhaler Inhale 1-2 puffs into the lungs every 6 (six) hours as needed. 1 Inhaler 0  . amLODipine (NORVASC) 10 MG tablet Take 1 tablet (10 mg total) by mouth daily. 90 tablet 3  . Ascorbic Acid (VITAMIN C PO) Take by mouth.    Marland Kitchen aspirin EC 81 MG tablet Take by mouth.    . Diclofenac Sodium (PENNSAID) 2 % SOLN Place 1 Pump onto the skin 2 (two) times daily as needed. 112 g 1  . docusate sodium (COLACE) 100 MG capsule Take 1 capsule (100 mg total) by mouth 2 (two) times daily. 10 capsule 0  . ferrous sulfate 325 (65 FE) MG tablet Take by mouth.    . latanoprost (XALATAN) 0.005 % ophthalmic solution   5  . Latanoprostene Bunod (VYZULTA) 0.024 % SOLN Apply 1 drop to eye 2 (two) times daily.    Marland Kitchen levothyroxine (SYNTHROID, LEVOTHROID) 50 MCG tablet Take 1 tablet (50 mcg total) by mouth daily before breakfast. 90 tablet 3  . Multiple Vitamin (MULTI-VITAMINS) TABS Take by mouth.    . Omega-3 Fatty Acids (FISH OIL PO) Take by mouth.    . Polyethylene Glycol 3350  (MIRALAX PO) Take by mouth.    . benzonatate (TESSALON) 100 MG capsule Take 1-2 capsules (100-200 mg total) by mouth 3 (three) times daily as needed for cough. 20 capsule 0  . cyclobenzaprine (FLEXERIL) 5 MG tablet Take 1 tablet (5 mg total) by mouth at bedtime. 14 tablet 0  . lidocaine (LIDODERM) 5 % Place 1 patch onto the skin daily. Remove & Discard patch within 12 hours or as directed by MD 15 patch 1  . lisinopril (PRINIVIL,ZESTRIL) 40 MG tablet Take 1 tablet (40 mg total) by mouth daily. 90 tablet 1  . metoprolol succinate (TOPROL-XL) 100 MG 24 hr tablet Take 1 tablet (100 mg total) by mouth daily. Take with or immediately following a meal. 90 tablet 1   No facility-administered medications prior to visit.     ROS See HPI  Objective:  BP 132/76   Pulse 78   Temp 97.6 F (36.4 C) (Oral)   Ht 5\' 3"  (1.6 m)   Wt 206 lb (93.4 kg)   SpO2 98%   BMI 36.49 kg/m   BP Readings from Last 3 Encounters:  08/15/18 132/76  08/07/18 (!) 162/82  07/21/18 (!) 200/100    Wt Readings from Last 3 Encounters:  08/15/18 206 lb (93.4 kg)  08/07/18 203 lb (92.1 kg)  07/21/18 206 lb (93.4 kg)    Physical Exam Vitals signs  reviewed.  Cardiovascular:     Rate and Rhythm: Normal rate and regular rhythm.     Pulses: Normal pulses.     Heart sounds: Normal heart sounds.  Pulmonary:     Effort: Pulmonary effort is normal.     Breath sounds: Normal breath sounds.  Musculoskeletal:     Left lower leg: No edema.  Neurological:     Mental Status: She is alert and oriented to person, place, and time.    Lab Results  Component Value Date   WBC 6.8 04/30/2018   HGB 11.9 (L) 04/30/2018   HCT 37.5 04/30/2018   PLT 287.0 04/30/2018   GLUCOSE 82 08/07/2018   CHOL 167 01/08/2018   TRIG 80.0 01/08/2018   HDL 55.80 01/08/2018   LDLCALC 95 01/08/2018   ALT 13 01/08/2018   AST 15 01/08/2018   NA 141 08/07/2018   K 3.6 08/07/2018   CL 105 08/07/2018   CREATININE 0.71 08/07/2018   BUN 12  08/07/2018   CO2 28 08/07/2018   TSH 1.80 08/07/2018    Assessment & Plan:   Shelly Cohen was seen today for hypertension.  Diagnoses and all orders for this visit:  Primary osteoarthritis of right hip -     HYDROcodone-acetaminophen (NORCO/VICODIN) 5-325 MG tablet; Take 0.5-1 tablets by mouth every 12 (twelve) hours as needed for up to 5 days for moderate pain. -     cyclobenzaprine (FLEXERIL) 5 MG tablet; Take 1 tablet (5 mg total) by mouth at bedtime as needed for muscle spasms.  HTN (hypertension), benign -     metoprolol succinate (TOPROL-XL) 100 MG 24 hr tablet; Take 1 tablet (100 mg total) by mouth daily. Take with or immediately following a meal. -     lisinopril (PRINIVIL,ZESTRIL) 40 MG tablet; Take 1 tablet (40 mg total) by mouth daily.  Hypothyroidism, unspecified type   I have discontinued Shelly Cohen's lidocaine and benzonatate. I have also changed her cyclobenzaprine. Additionally, I am having her start on HYDROcodone-acetaminophen. Lastly, I am having her maintain her MULTI-VITAMINS, ferrous sulfate, aspirin EC, Omega-3 Fatty Acids (FISH OIL PO), Polyethylene Glycol 3350 (MIRALAX PO), latanoprost, Diclofenac Sodium, levothyroxine, Latanoprostene Bunod, Ascorbic Acid (VITAMIN C PO), albuterol, amLODipine, docusate sodium, metoprolol succinate, and lisinopril.  Meds ordered this encounter  Medications  . HYDROcodone-acetaminophen (NORCO/VICODIN) 5-325 MG tablet    Sig: Take 0.5-1 tablets by mouth every 12 (twelve) hours as needed for up to 5 days for moderate pain.    Dispense:  10 tablet    Refill:  0    Order Specific Question:   Supervising Provider    Answer:   MATTHEWS, CODY [[id number]]  . cyclobenzaprine (FLEXERIL) 5 MG tablet    Sig: Take 1 tablet (5 mg total) by mouth at bedtime as needed for muscle spasms.    Dispense:  14 tablet    Refill:  0    Order Specific Question:   Supervising Provider    Answer:   MATTHEWS, CODY [[id number]]  . metoprolol succinate  (TOPROL-XL) 100 MG 24 hr tablet    Sig: Take 1 tablet (100 mg total) by mouth daily. Take with or immediately following a meal.    Dispense:  90 tablet    Refill:  3    Order Specific Question:   Supervising Provider    Answer:   MATTHEWS, CODY [[id number]]  . lisinopril (PRINIVIL,ZESTRIL) 40 MG tablet    Sig: Take 1 tablet (40 mg total) by mouth daily.    Dispense:  90 tablet    Refill:  3    Order Specific Question:   Supervising Provider    Answer:   MATTHEWS, CODY [[id number]]    Problem List Items Addressed This Visit      Cardiovascular and Mediastinum   HTN (hypertension), benign    Improved with amlodipine, lisinopril, and metoprolol. Denies any adverse effects. Upcoming appt with cardiology 08/22/2018. Negative renal US and adrenal gland dysfunction. appt with neurology to discuss sleep study 09/16/2018. BP Readings from Last 3 Encounters:  08/15/18 132/76  08/07/18 (!) 162/82  07/21/18 (!) 200/100         Relevant Medications   metoprolol succinate (TOPROL-XL) 100 MG 24 hr tablet   lisinopril (PRINIVIL,ZESTRIL) 40 MG tablet     Endocrine   Hypothyroidism    Stable with levothyroxine 51mcg      Relevant Medications   metoprolol succinate (TOPROL-XL) 100 MG 24 hr tablet     Musculoskeletal and Integument   OA (osteoarthritis) of hip   Relevant Medications   HYDROcodone-acetaminophen (NORCO/VICODIN) 5-325 MG tablet   cyclobenzaprine (FLEXERIL) 5 MG tablet       Follow-up: Return in about 4 weeks (around 09/12/2018) for HTN.  Wilfred Lacy, NP

## 2018-08-15 NOTE — Patient Instructions (Addendum)
Maintain current medications.  F/up with me in 38month if Dr.Turner does not change medication regimen. If she does, maintain appts with her and f/up with me in 77months.  Maintain appt with ortho and neurology.  Will complete FMLA for hip pain from 08/07/2018 till appt with ortho 08/18/2018.

## 2018-08-15 NOTE — Assessment & Plan Note (Signed)
Improved with amlodipine, lisinopril, and metoprolol. Denies any adverse effects. Upcoming appt with cardiology 08/22/2018. Negative renal US and adrenal gland dysfunction. appt with neurology to discuss sleep study 09/16/2018. BP Readings from Last 3 Encounters:  08/15/18 132/76  08/07/18 (!) 162/82  07/21/18 (!) 200/100

## 2018-08-15 NOTE — Assessment & Plan Note (Signed)
Stable with levothyroxine 63mcg

## 2018-08-18 ENCOUNTER — Ambulatory Visit (INDEPENDENT_AMBULATORY_CARE_PROVIDER_SITE_OTHER): Payer: 59 | Admitting: Orthopaedic Surgery

## 2018-08-18 ENCOUNTER — Encounter (INDEPENDENT_AMBULATORY_CARE_PROVIDER_SITE_OTHER): Payer: Self-pay | Admitting: Orthopaedic Surgery

## 2018-08-18 VITALS — BP 174/82 | HR 91 | Ht 63.0 in | Wt 203.0 lb

## 2018-08-18 DIAGNOSIS — M25551 Pain in right hip: Secondary | ICD-10-CM

## 2018-08-18 NOTE — Progress Notes (Signed)
Office Visit Note   Patient: Shelly Cohen           Date of Birth: 25-Mar-1961           MRN: [id number] Visit Date: 08/18/2018              Requested by: Flossie Buffy, NP [place], [place] [address] PCP: Flossie Buffy, NP   Assessment & Plan: Visit Diagnoses:  1. Pain of right hip joint     Plan: Advanced osteoarthritis right hip.  Long discussion regarding pathology and treatment options.  She is ready for hip replacement.  I think she be better served with an anterior approach to the hip and referred her to Dr. Ninfa Linden  Follow-Up Instructions: Return will refewr to Dr Ninfa Linden for [facility].   Orders:  No orders of the defined types were placed in this encounter.  No orders of the defined types were placed in this encounter.     Procedures: No procedures performed   Clinical Data: No additional findings.   Subjective: Chief Complaint  Patient presents with  . Right Hip - Pain  Patient presents today with right hip pain. She said that it started after April of 2019 when she had foot surgery. She had to wear a walking boot afterwards and states it caused her right hip to start hurting. Her pain is located in her groin. She is taking oxycodone at night for pain. Her pain wakes her night, but the oxycodone has helped. She states that her right leg feels weak and has affected her gait. Her PCP ordered x-rays on 04/22/18 and she had an MRI on 07/03/18. I reviewed the films of her right hip on the PACS system and there is significant narrowing of the joint space with subchondral sclerosis and small subchondral cysts on both sides of the joint.  She also has had an MRI scan revealing advanced osteoarthritis of the same hip.  She has been treated with over-the-counter medicines and a hip injection without much relief  HPI  Review of Systems   Objective: Vital Signs: BP (!) 174/82   Pulse 91   Ht 5\' 3"  (1.6 m)   Wt 203 lb (92.1 kg)   BMI  35.96 kg/m   Physical Exam Constitutional:      Appearance: She is well-developed.  Eyes:     Pupils: Pupils are equal, round, and reactive to light.  Pulmonary:     Effort: Pulmonary effort is normal.  Skin:    General: Skin is warm and dry.  Neurological:     Mental Status: She is alert and oriented to person, place, and time.  Psychiatric:        Behavior: Behavior normal.     Ortho Exam awake alert and oriented x3.  Comfortable sitting.  Does have some limited range of motion of right hip compared to the left with internal and external rotation.  I could get about 15 to 20 degrees of both motions.  No pain with flexion extension.  Large thighs.  Neurologically intact.  Leg lengths symmetrical  Specialty Comments:  No specialty comments available.  Imaging: No results found.   PMFS History: Patient Active Problem List   Diagnosis Date Noted  . Trigger thumb, left thumb 05/07/2018  . OA (osteoarthritis) of hip 04/22/2018  . Open angle primary glaucoma 01/08/2018  . Mass of breast, left 06/29/2016  . Thalassemia trait, alpha 06/29/2016  . Iron deficiency anemia 06/29/2016  . S/P  bunionectomy 06/29/2016  . Great toe pain, right 06/29/2016  . Encounter for weight loss counseling 06/29/2016  . HTN (hypertension), benign 05/18/2016  . Hypothyroidism 05/18/2016   Past Medical History:  Diagnosis Date  . Alpha thalassemia trait   . Fibroadenoma   . Glaucoma    monitored every 65months  . History of positive PPD, treatment status unknown   . Hypertension   . Thyroid disease     Family History  Problem Relation Age of Onset  . Hypertension Mother   . Anemia Mother   . Arthritis Mother        rheumatoid arthritis  . Kidney disease Mother   . Cataracts Mother   . Heart disease Mother   . Hypertension Sister   . Cataracts Sister   . Hypertension Brother   . Glaucoma Brother   . Diabetes Brother   . Cancer Maternal Grandmother        leukemia  . Cancer  Maternal Grandfather        lung  . Glaucoma Maternal Aunt     Past Surgical History:  Procedure Laterality Date  . ABDOMINAL HYSTERECTOMY  1989  . BREAST BIOPSY  2013  . BUNIONECTOMY  10/2014  . CHOLECYSTECTOMY  2017  . COLONOSCOPY    . GALLBLADDER SURGERY    . hallux limi    . LAPAROSCOPY    . TUBAL LIGATION     Social History   Occupational History  . Not on file  Tobacco Use  . Smoking status: Never Smoker  . Smokeless tobacco: Never Used  Substance and Sexual Activity  . Alcohol use: No  . Drug use: No  . Sexual activity: Yes    Birth control/protection: Surgical

## 2018-08-21 ENCOUNTER — Encounter (INDEPENDENT_AMBULATORY_CARE_PROVIDER_SITE_OTHER): Payer: Self-pay | Admitting: Orthopaedic Surgery

## 2018-08-21 ENCOUNTER — Ambulatory Visit (INDEPENDENT_AMBULATORY_CARE_PROVIDER_SITE_OTHER): Payer: 59 | Admitting: Orthopaedic Surgery

## 2018-08-21 DIAGNOSIS — M1611 Unilateral primary osteoarthritis, right hip: Secondary | ICD-10-CM

## 2018-08-21 DIAGNOSIS — M25551 Pain in right hip: Secondary | ICD-10-CM | POA: Diagnosis not present

## 2018-08-21 NOTE — Progress Notes (Signed)
Office Visit Note   Patient: Shelly Cohen           Date of Birth: [birth year]           MRN: [id number] Visit Date: 08/21/2018              Requested by: Flossie Buffy, NP [place], [place] [address] PCP: Flossie Buffy, NP   Assessment & Plan: Visit Diagnoses:  1. Pain of right hip joint   2. Unilateral primary osteoarthritis, right hip     Plan: Based on her signs and symptoms, her clinical exam, and her MRI findings as well as plain film findings she does have severe osteoarthritis and degenerative joint disease of the right hip.  At this point with a failed conservative treatment and considering the level of her pain we are recommending a total hip arthroplasty through direct anterior approach.  I showed her hip model and went over x-rays and MRI with her in detail.  I talked about her intraoperative and postoperative course and described the risk of minutes of this type of surgery.  All questions concerns were answered and addressed.  She would like to have this scheduled in the near future and I agree with this as well.  Follow-Up Instructions: Return for 2 weeks post-op.   Orders:  No orders of the defined types were placed in this encounter.  No orders of the defined types were placed in this encounter.     Procedures: No procedures performed   Clinical Data: No additional findings.   Subjective: Chief Complaint  Patient presents with  . Right Hip - Pain  The patient is here today to discuss hip replacement surgery.  She is actually been seen by 1 of her partners who recommended total hip arthroplasty.  She is referred to me for potential of anterior hip surgery.  She has a BMI of 36.  She works as a Teaching laboratory technician.  She is very pleasant 58 years old.  She is been having worsening hip pain for over a year now.  It did get worse after foot surgery where she had to wear a boot.  It hurts in her groin and is daily.  Her hip is  become very stiff.  She is tried failed all forms conservative treatment.  The medications that she takes and start to bother her stomach too much and do not make her feel well.  Osteoarthritis and joint replacement surgery does run in the family.  She is not a diabetic and not a smoker.  She is worked on activity modification anti-inflammatories and has had conservative treatment for over a year including attempts at weight loss and strengthening of her hip.  At this point her right hip pain is detrimentally affecting her actives daily living, her mobility and her quality of life  HPI  Review of Systems She currently denies any headache, chest pain, shortness of breath, fever, chills, nausea, vomiting. Objective: Vital Signs: There were no vitals taken for this visit.  Physical Exam She is alert and orient x3 and in no acute distress Ortho Exam Examination of her left hip is normal examination of her right hip shows significant stiffness and pain in the groin.  Internal X rotation causes a significant bout of pain. Specialty Comments:  No specialty comments available.  Imaging: No results found. X-rays and MRI of her right hip are reviewed and show severe end-stage arthritis.  There is full-thickness cartilage loss in the  femoral head.  This is also in the acetabulum as well.  There are large cystic changes in acetabular roof and particular osteophytes.  PMFS History: Patient Active Problem List   Diagnosis Date Noted  . Unilateral primary osteoarthritis, right hip 08/21/2018  . Trigger thumb, left thumb 05/07/2018  . OA (osteoarthritis) of hip 04/22/2018  . Open angle primary glaucoma 01/08/2018  . Mass of breast, left 06/29/2016  . Thalassemia trait, alpha 06/29/2016  . Iron deficiency anemia 06/29/2016  . S/P bunionectomy 06/29/2016  . Great toe pain, right 06/29/2016  . Encounter for weight loss counseling 06/29/2016  . HTN (hypertension), benign 05/18/2016  . Hypothyroidism  05/18/2016   Past Medical History:  Diagnosis Date  . Alpha thalassemia trait   . Fibroadenoma   . Glaucoma    monitored every 40months  . History of positive PPD, treatment status unknown   . Hypertension   . Thyroid disease     Family History  Problem Relation Age of Onset  . Hypertension Mother   . Anemia Mother   . Arthritis Mother        rheumatoid arthritis  . Kidney disease Mother   . Cataracts Mother   . Heart disease Mother   . Hypertension Sister   . Cataracts Sister   . Hypertension Brother   . Glaucoma Brother   . Diabetes Brother   . Cancer Maternal Grandmother        leukemia  . Cancer Maternal Grandfather        lung  . Glaucoma Maternal Aunt     Past Surgical History:  Procedure Laterality Date  . ABDOMINAL HYSTERECTOMY  1989  . BREAST BIOPSY  2013  . BUNIONECTOMY  10/2014  . CHOLECYSTECTOMY  2017  . COLONOSCOPY    . GALLBLADDER SURGERY    . hallux limi    . LAPAROSCOPY    . TUBAL LIGATION     Social History   Occupational History  . Not on file  Tobacco Use  . Smoking status: Never Smoker  . Smokeless tobacco: Never Used  Substance and Sexual Activity  . Alcohol use: No  . Drug use: No  . Sexual activity: Yes    Birth control/protection: Surgical

## 2018-08-22 ENCOUNTER — Encounter: Payer: Self-pay | Admitting: Cardiology

## 2018-08-22 ENCOUNTER — Ambulatory Visit (INDEPENDENT_AMBULATORY_CARE_PROVIDER_SITE_OTHER): Payer: 59 | Admitting: Cardiology

## 2018-08-22 ENCOUNTER — Telehealth: Payer: Self-pay | Admitting: *Deleted

## 2018-08-22 VITALS — BP 160/86 | HR 87 | Ht 63.0 in | Wt 207.4 lb

## 2018-08-22 DIAGNOSIS — I10 Essential (primary) hypertension: Secondary | ICD-10-CM

## 2018-08-22 DIAGNOSIS — E669 Obesity, unspecified: Secondary | ICD-10-CM | POA: Diagnosis not present

## 2018-08-22 MED ORDER — CARVEDILOL 25 MG PO TABS: 25.0000 mg | ORAL_TABLET | Freq: Two times a day (BID) | ORAL | 3 refills | Status: DC

## 2018-08-22 MED ORDER — SPIRONOLACTONE 25 MG PO TABS: 25.0000 mg | ORAL_TABLET | Freq: Every day | ORAL | 3 refills | Status: DC

## 2018-08-22 MED FILL — SPIRONOLACTONE 25 MG TABLET: 25 | 90 days supply | Qty: 90 | Fill #0

## 2018-08-22 MED FILL — CYCLOBENZAPRINE 5 MG TABLET: 5 | 14 days supply | Qty: 14 | Fill #0

## 2018-08-22 MED FILL — CARVEDILOL 25 MG TABLET: 25 | 90 days supply | Qty: 180 | Fill #0

## 2018-08-22 MED FILL — HYDROCODON-APAP 5-325: 5-325 | 5 days supply | Qty: 10 | Fill #0

## 2018-08-22 NOTE — Telephone Encounter (Signed)
Staff message sent to [facility]. Patient has [facility] UMR. No PA is required. Ok to schedule sleep study.

## 2018-08-22 NOTE — Progress Notes (Signed)
Cardiology Office Note    Date:  08/22/2018   ID:  Shelly Cohen, DOB [birth year], MRN [id number]  PCP:  Flossie Buffy, NP  Cardiologist:  Fransico Him, MD   Chief Complaint  Patient presents with  . Hypertension    History of Present Illness:he  Flossie Wexler is a 58 y.o. female who is being seen today for the evaluation of HTN at the request of Ronnald Nian, DO.  This is a 58yo female with a history of HTN and thyroid disease who is referred by her PCP for evaluation of poorly controlled HTN.  She was seen in the ER 07/18/2018 for uncontrolled HTN and HA.  She had been on Lisinopril 20mg  daily, amlodipine 2.5mg  daily and Toprol XL 100mg  daily.  She did not tolerate diuretics in the past due to hypokalemia.  RSheenal artery duplex was normal.  Currently she is on amlodipine 10 mg daily, lisinopril 40 mg daily and Toprol-XL 100 mg daily.  Patient requested evaluation by cardiology due to continued poorly controlled blood pressure.  Of note aldosterone and Aldo PRA ratio were normal.  Plasma catecholamine levels were normal but no urine catecholamines were done.  She is here today for followup and is doing well.  She denies any chest pain or pressure, SOB, DOE, PND, orthopnea, LE edema, dizziness, palpitations or syncope. She is compliant with her meds and is tolerating meds with no SE.    Past Medical History:  Diagnosis Date  . Alpha thalassemia trait   . Fibroadenoma   . Glaucoma    monitored every 63months  . History of positive PPD, treatment status unknown   . Hypertension   . Thyroid disease     Past Surgical History:  Procedure Laterality Date  . ABDOMINAL HYSTERECTOMY  1989  . BREAST BIOPSY  2013  . BUNIONECTOMY  10/2014  . CHOLECYSTECTOMY  2017  . COLONOSCOPY    . GALLBLADDER SURGERY    . hallux limi    . LAPAROSCOPY    . TUBAL LIGATION      Current Medications: Current Meds  Medication Sig  . albuterol (PROVENTIL HFA;VENTOLIN HFA) 108  (90 Base) MCG/ACT inhaler Inhale 1-2 puffs into the lungs every 6 (six) hours as needed.  Marland Kitchen amLODipine (NORVASC) 10 MG tablet Take 1 tablet (10 mg total) by mouth daily.  . Ascorbic Acid (VITAMIN C PO) Take by mouth.  Marland Kitchen aspirin EC 81 MG tablet Take by mouth.  . cyclobenzaprine (FLEXERIL) 5 MG tablet Take 1 tablet (5 mg total) by mouth at bedtime as needed for muscle spasms.  . Diclofenac Sodium (PENNSAID) 2 % SOLN Place 1 Pump onto the skin 2 (two) times daily as needed.  . docusate sodium (COLACE) 100 MG capsule Take 1 capsule (100 mg total) by mouth 2 (two) times daily.  . ferrous sulfate 325 (65 FE) MG tablet Take by mouth.  . latanoprost (XALATAN) 0.005 % ophthalmic solution   . Latanoprostene Bunod (VYZULTA) 0.024 % SOLN Apply 1 drop to eye 2 (two) times daily.  Marland Kitchen levothyroxine (SYNTHROID, LEVOTHROID) 50 MCG tablet Take 1 tablet (50 mcg total) by mouth daily before breakfast.  . lisinopril (PRINIVIL,ZESTRIL) 40 MG tablet Take 1 tablet (40 mg total) by mouth daily.  . metoprolol succinate (TOPROL-XL) 100 MG 24 hr tablet Take 1 tablet (100 mg total) by mouth daily. Take with or immediately following a meal.  . Multiple Vitamin (MULTI-VITAMINS) TABS Take by mouth.  . Omega-3 Fatty Acids (FISH OIL PO)  Take by mouth.  . Polyethylene Glycol 3350 (MIRALAX PO) Take by mouth.    Allergies:   Diphenhydramine hcl; Hydrocodone; Azithromycin; Cephalexin; Penicillin g; and Zolpidem   Social History   Socioeconomic History  . Marital status: Married    Spouse name: Not on file  . Number of children: Not on file  . Years of education: Not on file  . Highest education level: Not on file  Occupational History  . Not on file  Social Needs  . Financial resource strain: Not on file  . Food insecurity:    Worry: Not on file    Inability: Not on file  . Transportation needs:    Medical: Not on file    Non-medical: Not on file  Tobacco Use  . Smoking status: Never Smoker  . Smokeless tobacco:  Never Used  Substance and Sexual Activity  . Alcohol use: No  . Drug use: No  . Sexual activity: Yes    Birth control/protection: Surgical  Lifestyle  . Physical activity:    Days per week: Not on file    Minutes per session: Not on file  . Stress: Not on file  Relationships  . Social connections:    Talks on phone: Not on file    Gets together: Not on file    Attends religious service: Not on file    Active member of club or organization: Not on file    Attends meetings of clubs or organizations: Not on file    Relationship status: Not on file  Other Topics Concern  . Not on file  Social History Narrative  . Not on file     Family History:  The patient's family history includes Anemia in her mother; Arthritis in her mother; Cancer in her maternal grandfather and maternal grandmother; Cataracts in her mother and sister; Diabetes in her brother; Glaucoma in her brother and maternal aunt; Heart disease in her mother; Hypertension in her brother, mother, and sister; Kidney disease in her mother.   ROS:   Please see the history of present illness.    ROS All other systems reviewed and are negative.  No flowsheet data found.     PHYSICAL EXAM:   VS:  BP (!) 160/86   Pulse 87   Ht 5\' 3"  (1.6 m)   Wt 207 lb 6.4 oz (94.1 kg)   SpO2 98%   BMI 36.74 kg/m    GEN: Well nourished, well developed, in no acute distress  HEENT: normal  Neck: no JVD, carotid bruits, or masses Cardiac: RRR; no murmurs, rubs, or gallops,no edema.  Intact distal pulses bilaterally.  Respiratory:  clear to auscultation bilaterally, normal work of breathing GI: soft, nontender, nondistended, + BS MS: no deformity or atrophy  Skin: warm and dry, no rash Neuro:  Alert and Oriented x 3, Strength and sensation are intact Psych: euthymic mood, full affect  Wt Readings from Last 3 Encounters:  08/22/18 207 lb 6.4 oz (94.1 kg)  08/18/18 203 lb (92.1 kg)  08/15/18 206 lb (93.4 kg)      Studies/Labs  Reviewed:   EKG:  EKG is not ordered today.   Recent Labs: 01/08/2018: ALT 13 04/30/2018: Hemoglobin 11.9; Platelets 287.0 08/07/2018: BUN 12; Creatinine, Ser 0.71; Potassium 3.6; Sodium 141; TSH 1.80   Lipid Panel    Component Value Date/Time   CHOL 167 01/08/2018 0855   TRIG 80.0 01/08/2018 0855   HDL 55.80 01/08/2018 0855   CHOLHDL 3 01/08/2018 0855  VLDL 16.0 01/08/2018 0855   [place] 95 01/08/2018 0855    Additional studies/ records that were reviewed today include:      ASSESSMENT:    1. HTN (hypertension), benign   2. Obesity (BMI 30-39.9)      PLAN:  In order of problems listed above:  1.  HTN - this is been poorly controlled and associated with headaches.  Plasma Aldo and Aldo PRA ratios were normal.  Plasma catecholamine levels were done and were normal but urine catecholamines were have not been done.  Renal artery duplex showed no evidence of renal artery stenosis.  This is likely essential hypertension and she has a very strong family history of hypertension.  Her mother died from complications of hypertension.  I think she would benefit from sleep study given her obesity.  We will order a split-night sleep study.  I will check a 24-hour urine for catecholamines.  I encouraged her to try to lose weight and get into an exercise program which would help with her hypertension.  She will continue on amlodipine 10 mg daily and  lisinopril 40 mg daily.  I am going to add a potassium sparing diuretic, spironolactone 25 mg daily, and also change her Toprol to carvedilol 25 mg twice daily and repeat a bmet in 1 week.  She will follow-up in hypertension clinic in 1 week and then with a PA in 4 weeks.  I will see her back in 6 months if blood pressure has become controlled I will also check a 2D echocardiogram to make sure she has not had any endorgan damage with LVH on echo.  Renal function is normal.  2.  Obesity - I have encouraged her to get into a routine exercise program  and cut back on carbs and portions.    Medication Adjustments/Labs and Tests Ordered: Current medicines are reviewed at length with the patient today.  Concerns regarding medicines are outlined above.  Medication changes, Labs and Tests ordered today are listed in the Patient Instructions below.  There are no Patient Instructions on file for this visit.   Signed, Fransico Him, MD  08/22/2018 10:06 AM    [address], [place], [place]  [address] Phone: [phone]; Fax: [phone]

## 2018-08-22 NOTE — Addendum Note (Signed)
Addended by: Sarina Ill on: 08/22/2018 10:58 AM   Modules accepted: Orders

## 2018-08-22 NOTE — Patient Instructions (Addendum)
Medication Instructions:  Stop: Toprol Start: Spironolactone 25 mg, a day, by mouth Start: Carvedilol 25 mg, twice a day, by mouth  If you need a refill on your cardiac medications before your next appointment, please call your pharmacy.   Lab work: Today: 24 Hour urine Dopamine, cortisol, metanephrines and catecholamines. Future: BMET: 1 week, same day as Blood pressure clinic appointment.   If you have labs (blood work) drawn today and your tests are completely normal, you will receive your results only by: Marland Kitchen MyChart Message (if you have MyChart) OR . A paper copy in the mail If you have any lab test that is abnormal or we need to change your treatment, we will call you to review the results.  Testing/Procedures: Your physician has requested that you have an echocardiogram. Echocardiography is a painless test that uses sound waves to create images of your heart. It provides your doctor with information about the size and shape of your heart and how well your heart's chambers and valves are working. This procedure takes approximately one hour. There are no restrictions for this procedure.  Your physician has recommended that you have a sleep study. This test records several body functions during sleep, including: brain activity, eye movement, oxygen and carbon dioxide blood levels, heart rate and rhythm, breathing rate and rhythm, the flow of air through your mouth and nose, snoring, body muscle movements, and chest and belly movement.  Follow-Up: At [facility], you and your health needs are our priority.  As part of our continuing mission to provide you with exceptional heart care, we have created designated Provider Care Teams.  These Care Teams include your primary Cardiologist (physician) and Advanced Practice Providers (APPs -  Physician Assistants and Nurse Practitioners) who all work together to provide you with the care you need, when you need it.  Your physician recommends that  you schedule a follow-up appointment in: 4 weeks with the PA.   You will need a follow up appointment in 6 months.  Please call our office 2 months in advance to schedule this appointment.  You may see Dr. Radford Pax or one of the following Advanced Practice Providers on your designated Care Team:   [place], PA-C Melina Copa, PA-C . Ermalinda Barrios, PA-C  Your physician recommends that you schedule a follow-up appointment in: 1 week with the Blood Pressure Clinic

## 2018-08-24 DIAGNOSIS — I10 Essential (primary) hypertension: Secondary | ICD-10-CM | POA: Diagnosis not present

## 2018-08-25 ENCOUNTER — Telehealth: Payer: Self-pay | Admitting: *Deleted

## 2018-08-25 NOTE — Telephone Encounter (Signed)
-----   Message from Sarina Ill, RN sent at 08/22/2018 12:49 PM EST ----- Regarding: Sleep Study Hello, Dr. Radford Pax ordered a sleep study, ESS in the chart. Please schedule. Thanks, [employer]

## 2018-08-26 ENCOUNTER — Ambulatory Visit (HOSPITAL_COMMUNITY): Payer: 59 | Attending: Cardiovascular Disease

## 2018-08-26 DIAGNOSIS — I10 Essential (primary) hypertension: Secondary | ICD-10-CM | POA: Diagnosis not present

## 2018-08-28 ENCOUNTER — Encounter (INDEPENDENT_AMBULATORY_CARE_PROVIDER_SITE_OTHER): Payer: Self-pay | Admitting: Orthopaedic Surgery

## 2018-08-29 LAB — METANEPHRINES, URINE, 24 HOUR
Metaneph Total, Ur: 155 ug/L
Metanephrines, 24H Ur: 155 ug/24 hr (ref 45–290)
NORMETANEPHRINE 24H UR: 420 ug/(24.h) (ref 82–500)
Normetanephrine, Ur: 420 ug/L

## 2018-08-29 LAB — CATECHOLAMINES, FRACTIONATED, URINE, 24 HOUR
DOPAMINE RANDOM UR: 273 ug/L
Dopamine , 24H Ur: 273 ug/24 hr (ref 0–510)
EPINEPHRINE 24H UR: 5 ug/(24.h) (ref 0–20)
EPINEPHRINE RAND UR: 5 ug/L
NOREPINEPH RAND UR: 85 ug/L
NOREPINEPHRINE 24H UR: 85 ug/(24.h) (ref 0–135)

## 2018-08-29 LAB — CORTISOL, URINE, FREE
CORTISOL (UR), FREE: 9 ug/(24.h) (ref 6–42)
Cortisol,F,ug/L,U: 9 ug/L

## 2018-08-29 NOTE — Telephone Encounter (Addendum)
Patient is scheduled for lab study on 10/14/18. Patient understands her sleep study will be done at [facility] sleep lab. Patient understands she will receive a sleep packet in a week or so. Patient understands to call if she does not receive the sleep packet in a timely manner. Patient agrees with treatment and thanked me for call.

## 2018-08-29 NOTE — Telephone Encounter (Signed)
  Lauralee Evener, CMA  Freada Bergeron, CMA        Patient has [facility]. No PA is required. Ok to schedule sleep study.

## 2018-09-01 NOTE — Progress Notes (Signed)
Please place orders in Epic as patient has a pre-op appointment on 09/08/2018! Thank you!

## 2018-09-02 ENCOUNTER — Encounter: Payer: Self-pay | Admitting: Nurse Practitioner

## 2018-09-02 ENCOUNTER — Other Ambulatory Visit (INDEPENDENT_AMBULATORY_CARE_PROVIDER_SITE_OTHER): Payer: Self-pay | Admitting: Physician Assistant

## 2018-09-03 ENCOUNTER — Ambulatory Visit (INDEPENDENT_AMBULATORY_CARE_PROVIDER_SITE_OTHER): Payer: 59 | Admitting: Pharmacist

## 2018-09-03 ENCOUNTER — Other Ambulatory Visit: Payer: 59 | Admitting: *Deleted

## 2018-09-03 ENCOUNTER — Encounter: Payer: Self-pay | Admitting: Pharmacist

## 2018-09-03 VITALS — BP 140/60 | HR 91

## 2018-09-03 DIAGNOSIS — I10 Essential (primary) hypertension: Secondary | ICD-10-CM

## 2018-09-03 NOTE — Patient Instructions (Signed)
I will call you with your lab results tomorrow and we can discuss our options over the phone.  Continue checking your blood pressure at home. Bring your blood pressure cuff with you to your next appointment.   Call us at [phone] with any questions or concerns.

## 2018-09-03 NOTE — Progress Notes (Signed)
Patient ID: Shelly Cohen                 DOB: Sep 26, 1960                      MRN: [id number]     HPI: Shelly Cohen is a 58 y.o. female referred by Shelly Cohen to HTN clinic. PMH is significant for HTN and thyroid disease. Patient has a history of poorly controlled HTN. Secondary work up has included renal artery duplex, aldosterone and aldo PRA ratio and plasma/urine catecholamine levels, urine cortisol which were all normal. Patient was seen in clinic on 2/21 by Dr Radford Cohen. Her lood pressure was 160/86. At that visit, metoprolol was changed carvedilol 25mg  twice a day and spironolactone was added.  Patient presents today for blood pressure check. She states that she is doing well. No side effects from medications. Denies dizziness, lightheadedness, headaches, blurred vision and swelling. She states that she has been watching her portions and working on diet. She has lost 15lb in 4 months. She has upcoming hip replacement surgery next Friday and hopes to be able to exercise after this surgery.   Patient BMP results pending during visit. Patient has an upper arm cuff and a wrist cuff at home. She does not really like using the upper arm cuff because it squeezes too hard. She has been checking her blood pressure about twice a day and recording readings in an app on her phone. Reviewed blood pressure technique with patient.  Current HTN meds: carvedilol 25mg  twice a day, spironolactone 25mg  daily, lisinopril 40mg  daily, amlodipine 10mg  daily Previously tried: hydralazine 25mg  twice a day, lisinopril/HCTZ (hypokalemia), metoprolol (changed to carvedilol for better BP lowering), furosemide (hypokalemia) BP goal: <130/80  Family History: The patient's family history includes Anemia in her mother; Arthritis in her mother; Cancer in her maternal grandfather and maternal grandmother; Cataracts in her mother and sister; Diabetes in her brother; Glaucoma in her brother and maternal aunt; Heart disease  in her mother; Hypertension in her brother, mother, and sister; Kidney disease in her mother.   Social History: neg ETOH, neg tobacco   Diet: occassional caffinated drinks but not often. Cooks at home, cutting back on cooking w/ salt- using Mrs Deliah [employer], no processed meat, no canned food  Exercise: having hip replacement-  Home BP readings: 132/77, 144/81, 139/76, 124/72, 140/76, 134/80, 136/79, 133/79, 151/81, 131/83, 125/73, 148/80, 150/80, 132/66 152/84 HR 70-90's- stores readings in blood pressure app  Wt Readings from Last 3 Encounters:  08/22/18 207 lb 6.4 oz (94.1 kg)  08/18/18 203 lb (92.1 kg)  08/15/18 206 lb (93.4 kg)   BP Readings from Last 3 Encounters:  08/22/18 (!) 160/86  08/18/18 (!) 174/82  08/15/18 132/76   Pulse Readings from Last 3 Encounters:  08/22/18 87  08/18/18 91  08/15/18 78    Renal function: CrCl cannot be calculated (Patient's most recent lab result is older than the maximum 21 days allowed.).  Past Medical History:  Diagnosis Date  . Alpha thalassemia trait   . Fibroadenoma   . Glaucoma    monitored every 67months  . History of positive PPD, treatment status unknown   . Hypertension   . Thyroid disease     Current Outpatient Medications on File Prior to Visit  Medication Sig Dispense Refill  . albuterol (PROVENTIL HFA;VENTOLIN HFA) 108 (90 Base) MCG/ACT inhaler Inhale 1-2 puffs into the lungs every 6 (six) hours as needed. (Patient taking differently: Inhale 1-2  puffs into the lungs every 6 (six) hours as needed for wheezing. ) 1 Inhaler 0  . amLODipine (NORVASC) 10 MG tablet Take 1 tablet (10 mg total) by mouth daily. 90 tablet 3  . Ascorbic Acid (VITAMIN C PO) Take 1 tablet by mouth daily.     Marland Kitchen aspirin EC 81 MG tablet Take 81 mg by mouth daily.     . carvedilol (COREG) 25 MG tablet Take 1 tablet (25 mg total) by mouth 2 (two) times daily. 180 tablet 3  . cyclobenzaprine (FLEXERIL) 5 MG tablet Take 1 tablet (5 mg total) by mouth at bedtime as  needed for muscle spasms. 14 tablet 0  . docusate sodium (COLACE) 100 MG capsule Take 1 capsule (100 mg total) by mouth 2 (two) times daily. (Patient taking differently: Take 100 mg by mouth 2 (two) times daily as needed for moderate constipation. ) 10 capsule 0  . Latanoprostene Bunod (VYZULTA) 0.024 % SOLN Apply 1 drop to eye 2 (two) times daily.    Marland Kitchen levothyroxine (SYNTHROID, LEVOTHROID) 50 MCG tablet Take 1 tablet (50 mcg total) by mouth daily before breakfast. 90 tablet 3  . lisinopril (PRINIVIL,ZESTRIL) 40 MG tablet Take 1 tablet (40 mg total) by mouth daily. 90 tablet 3  . Multiple Vitamin (MULTI-VITAMINS) TABS Take 1 tablet by mouth daily.     . Omega-3 Fatty Acids (FISH OIL PO) Take 1 capsule by mouth daily.     . polyethylene glycol powder (MIRALAX) powder Take 17 g by mouth daily.     Marland Kitchen spironolactone (ALDACTONE) 25 MG tablet Take 1 tablet (25 mg total) by mouth daily. 90 tablet 3   No current facility-administered medications on file prior to visit.     Allergies  Allergen Reactions  . Diphenhydramine Hcl Other (See Comments)  . Hydrocodone Nausea Only  . Azithromycin Itching and Rash  . Cephalexin Rash  . Penicillin G Rash    Did it involve swelling of the face/tongue/throat, SOB, or low BP? No Did it involve sudden or severe rash/hives, skin peeling, or any reaction on the inside of your mouth or nose? No Did you need to seek medical attention at a hospital or doctor's office? No When did it last happen?childhood If all above answers are "NO", may proceed with cephalosporin use.   Marland Kitchen Zolpidem Itching and Rash    There were no vitals taken for this visit.   Assessment/Plan:  1. Hypertension - Blood pressure is above goal of <130/80. Patient is having hip replacement surgery soon. Hopefully with a reduction in pain and the ability to exercise post op, patients blood pressure will improve. Patient appears motivated to make lifestyle changes. Will need to wait on BMP  results before making medication changes. Will plan to increase spironolactone to 50mg  daily pending BMP results. Discussed potentially the need to add back hydralazine, however I would like to try to avoid this if possible due to it being a 3 time a day medication. I will call the patient tomorrow to discuss results.  Patient has an appointment with Brittainy on 09/29/18. I will have this serve as blood pressure follow up. We will be happy to see patient back in clinic again if needed after visit with Brittainy.   Thank you  Ramond Dial, Pharm.D, [place]  [address] [place], [place] [address]  Phone: [phone]; Fax: [phone]

## 2018-09-04 ENCOUNTER — Telehealth: Payer: Self-pay | Admitting: Pharmacist

## 2018-09-04 DIAGNOSIS — I10 Essential (primary) hypertension: Secondary | ICD-10-CM

## 2018-09-04 LAB — BASIC METABOLIC PANEL
BUN/Creatinine Ratio: 13 (ref 9–23)
BUN: 10 mg/dL (ref 6–24)
CO2: 23 mmol/L (ref 20–29)
Calcium: 9.7 mg/dL (ref 8.7–10.2)
Chloride: 105 mmol/L (ref 96–106)
Creatinine, Ser: 0.76 mg/dL (ref 0.57–1.00)
GFR calc Af Amer: 101 mL/min/{1.73_m2} (ref >59)
GFR calc non Af Amer: 87 mL/min/{1.73_m2} (ref >59)
Glucose: 99 mg/dL (ref 65–99)
Potassium: 4.1 mmol/L (ref 3.5–5.2)
Sodium: 145 mmol/L — ABNORMAL HIGH (ref 134–144)

## 2018-09-04 NOTE — Telephone Encounter (Addendum)
Attempted to call patient with lab results and discuss medication changes. Left voicemail for patient to return call.  BMP and K is stable. Will try increasing spironolactone to 50mg  daily. Will need repeat BMP at next visit in about a month

## 2018-09-05 NOTE — Patient Instructions (Signed)
Shelly Cohen  09/05/2018   Your procedure is scheduled on: 09-12-2018   Report to [facility] Main  Entrance     Report to admitting at 7:15AM    Call this number if you have problems the morning of surgery [phone]      Remember: Do not eat food or drink liquids :After Midnight. BRUSH YOUR TEETH MORNING OF SURGERY AND RINSE YOUR MOUTH OUT, NO CHEWING GUM CANDY OR MINTS.     Take these medicines the morning of surgery with A SIP OF WATER: amlodipine, carvedilol, levothyroxine, eye drops , albuterol inhaler if needed                                You may not have any metal on your body including hair pins and              piercings  Do not wear jewelry, make-up, lotions, powders or perfumes, deodorant             Do not wear nail polish.  Do not shave  48 hours prior to surgery.           Do not bring valuables to the hospital. [place].  Contacts, dentures or bridgework may not be worn into surgery.  Leave suitcase in the car. After surgery it may be brought to your room.                   Please read over the following fact sheets you were given: _____________________________________________________________________             [facility] - Preparing for Surgery Before surgery, you can play an important role.  Because skin is not sterile, your skin needs to be as free of germs as possible.  You can reduce the number of germs on your skin by washing with CHG (chlorahexidine gluconate) soap before surgery.  CHG is an antiseptic cleaner which kills germs and bonds with the skin to continue killing germs even after washing. Please DO NOT use if you have an allergy to CHG or antibacterial soaps.  If your skin becomes reddened/irritated stop using the CHG and inform your nurse when you arrive at Short Stay. Do not shave (including legs and underarms) for at least 48 hours prior to the first CHG  shower.  You may shave your face/neck. Please follow these instructions carefully:  1.  Shower with CHG Soap the night before surgery and the  morning of Surgery.  2.  If you choose to wash your hair, wash your hair first as usual with your  normal  shampoo.  3.  After you shampoo, rinse your hair and body thoroughly to remove the  shampoo.                           4.  Use CHG as you would any other liquid soap.  You can apply chg directly  to the skin and wash                       Gently with a scrungie or clean washcloth.  5.  Apply the CHG Soap to your body ONLY  FROM THE NECK DOWN.   Do not use on face/ open                           Wound or open sores. Avoid contact with eyes, ears mouth and genitals (private parts).                       Wash face,  Genitals (private parts) with your normal soap.             6.  Wash thoroughly, paying special attention to the area where your surgery  will be performed.  7.  Thoroughly rinse your body with warm water from the neck down.  8.  DO NOT shower/wash with your normal soap after using and rinsing off  the CHG Soap.                9.  Pat yourself dry with a clean towel.            10.  Wear clean pajamas.            11.  Place clean sheets on your bed the night of your first shower and do not  sleep with pets. Day of Surgery : Do not apply any lotions/deodorants the morning of surgery.  Please wear clean clothes to the hospital/surgery center.  FAILURE TO FOLLOW THESE INSTRUCTIONS MAY RESULT IN THE CANCELLATION OF YOUR SURGERY PATIENT SIGNATURE_________________________________  NURSE SIGNATURE__________________________________  ________________________________________________________________________   Shelly Cohen  An incentive spirometer is a tool that can help keep your lungs clear and active. This tool measures how well you are filling your lungs with each breath. Taking long deep breaths may help reverse or decrease the chance  of developing breathing (pulmonary) problems (especially infection) following:  A long period of time when you are unable to move or be active. BEFORE THE PROCEDURE   If the spirometer includes an indicator to show your best effort, your nurse or respiratory therapist will set it to a desired goal.  If possible, sit up straight or lean slightly forward. Try not to slouch.  Hold the incentive spirometer in an upright position. INSTRUCTIONS FOR USE  1. Sit on the edge of your bed if possible, or sit up as far as you can in bed or on a chair. 2. Hold the incentive spirometer in an upright position. 3. Breathe out normally. 4. Place the mouthpiece in your mouth and seal your lips tightly around it. 5. Breathe in slowly and as deeply as possible, raising the piston or the ball toward the top of the column. 6. Hold your breath for 3-5 seconds or for as long as possible. Allow the piston or ball to fall to the bottom of the column. 7. Remove the mouthpiece from your mouth and breathe out normally. 8. Rest for a few seconds and repeat Steps 1 through 7 at least 10 times every 1-2 hours when you are awake. Take your time and take a few normal breaths between deep breaths. 9. The spirometer may include an indicator to show your best effort. Use the indicator as a goal to work toward during each repetition. 10. After each set of 10 deep breaths, practice coughing to be sure your lungs are clear. If you have an incision (the cut made at the time of surgery), support your incision when coughing by placing a pillow or rolled up towels firmly against it. Once you  are able to get out of bed, walk around indoors and cough well. You may stop using the incentive spirometer when instructed by your caregiver.  RISKS AND COMPLICATIONS  Take your time so you do not get dizzy or light-headed.  If you are in pain, you may need to take or ask for pain medication before doing incentive spirometry. It is harder to  take a deep breath if you are having pain. AFTER USE  Rest and breathe slowly and easily.  It can be helpful to keep track of a log of your progress. Your caregiver can provide you with a simple table to help with this. If you are using the spirometer at home, follow these instructions: [place] IF:   You are having difficultly using the spirometer.  You have trouble using the spirometer as often as instructed.  Your pain medication is not giving enough relief while using the spirometer.  You develop fever of 100.5 F (38.1 C) or higher. SEEK IMMEDIATE MEDICAL CARE IF:   You cough up bloody sputum that had not been present before.  You develop fever of 102 F (38.9 C) or greater.  You develop worsening pain at or near the incision site. MAKE SURE YOU:   Understand these instructions.  Will watch your condition.  Will get help right away if you are not doing well or get worse. Document Released: 10/29/2006 Document Revised: 09/10/2011 Document Reviewed: 12/30/2006 [facility] Patient Information 2014 [place], [place].   ________________________________________________________________________

## 2018-09-05 NOTE — Progress Notes (Signed)
Epic - lov cardiology Dr Fransico Him 08-22-2018 -f/u visit with pharm at cards office on 09-03-2018 for BP recheck    ECHO 08-26-2018 epic   Bmp 09-03-2018 epic   ekg 04-30-18 epic

## 2018-09-08 ENCOUNTER — Encounter (HOSPITAL_COMMUNITY)
Admission: RE | Admit: 2018-09-08 | Discharge: 2018-09-08 | Disposition: A | Discharge status: Home / Self Care | Payer: 59 | Source: Ambulatory Visit | Attending: Orthopaedic Surgery | Admitting: Orthopaedic Surgery

## 2018-09-08 ENCOUNTER — Other Ambulatory Visit: Payer: Self-pay

## 2018-09-08 ENCOUNTER — Encounter (HOSPITAL_COMMUNITY): Payer: Self-pay

## 2018-09-08 DIAGNOSIS — Z01812 Encounter for preprocedural laboratory examination: Secondary | ICD-10-CM | POA: Insufficient documentation

## 2018-09-08 DIAGNOSIS — M1611 Unilateral primary osteoarthritis, right hip: Secondary | ICD-10-CM | POA: Diagnosis not present

## 2018-09-08 LAB — CBC
HCT: 39.8 % (ref 36.0–46.0)
Hemoglobin: 11.3 g/dL — ABNORMAL LOW (ref 12.0–15.0)
MCH: 24.2 pg — AB (ref 26.0–34.0)
MCHC: 28.4 g/dL — ABNORMAL LOW (ref 30.0–36.0)
MCV: 85.4 fL (ref 80.0–100.0)
Platelets: 310 10*3/uL (ref 150–400)
RBC: 4.66 MIL/uL (ref 3.87–5.11)
RDW: 17.2 % — ABNORMAL HIGH (ref 11.5–15.5)
WBC: 7.1 10*3/uL (ref 4.0–10.5)
nRBC: 0 % (ref 0.0–0.2)

## 2018-09-08 LAB — SURGICAL PCR SCREEN
MRSA, PCR: NEGATIVE
Staphylococcus aureus: NEGATIVE

## 2018-09-09 NOTE — Anesthesia Preprocedure Evaluation (Addendum)
Anesthesia Evaluation  Patient identified by MRN, date of birth, ID band Patient awake    Reviewed: Allergy & Precautions, NPO status , Patient's Chart, lab work & pertinent test results  Airway Mallampati: II  TM Distance: >3 FB Neck ROM: Full    Dental no notable dental hx.    Pulmonary neg pulmonary ROS,    Pulmonary exam normal breath sounds clear to auscultation       Cardiovascular hypertension, Normal cardiovascular exam Rhythm:Regular Rate:Normal     Neuro/Psych negative neurological ROS  negative psych ROS   GI/Hepatic negative GI ROS, Neg liver ROS,   Endo/Other  Hypothyroidism   Renal/GU negative Renal ROS  negative genitourinary   Musculoskeletal negative musculoskeletal ROS (+)   Abdominal   Peds negative pediatric ROS (+)  Hematology negative hematology ROS (+)   Anesthesia Other Findings   Reproductive/Obstetrics negative OB ROS                            Anesthesia Physical Anesthesia Plan  ASA: II  Anesthesia Plan: Spinal   Post-op Pain Management:    Induction: Intravenous  PONV Risk Score and Plan: 2 and Ondansetron and Dexamethasone  Airway Management Planned: Simple Face Mask  Additional Equipment:   Intra-op Plan:   Post-operative Plan:   Informed Consent: I have reviewed the patients History and Physical, chart, labs and discussed the procedure including the risks, benefits and alternatives for the proposed anesthesia with the patient or authorized representative who has indicated his/her understanding and acceptance.     Dental advisory given  Plan Discussed with: CRNA and Surgeon  Anesthesia Plan Comments: (See PAT note 09/08/18, Konrad Felix, PA-C)       Anesthesia Quick Evaluation

## 2018-09-09 NOTE — Progress Notes (Signed)
Anesthesia Chart Review   Case:  [id number] Date/Time:  09/12/18 0930   Procedure:  RIGHT TOTAL HIP ARTHROPLASTY ANTERIOR APPROACH (Right )   Anesthesia type:  Choice   Pre-op diagnosis:  osteoarthritis right hip   Location:  [facility] ROOM 09 / [facility]S   Surgeon:  Mcarthur Rossetti, MD      DISCUSSION: 58 yo never smoker with h/o HTN, alpha thalassemia train, glaucoma (right greater than left), right hip OA scheduled for above procedure 09/12/18 with Dr. Jean Rosenthal.   Pt seen by cardiologist, Dr. Fransico Him, 08/22/18 due to poorly controlled HTN.  Normal EKG and Echo.  Medication adjustments made.  At PAT visit 09/08/18 BP 131/69.    Pt can proceed with planned procedure barring acute status change.  VS: BP 131/69   Pulse 81   Temp 36.5 C (Oral)   Resp 16   Ht 5\' 3"  (1.6 m)   Wt 92.1 kg   SpO2 100%   BMI 35.96 kg/m   PROVIDERS: Nche, Charlene Brooke, NP is PCP   Fransico Him, MD is Cardiologist  LABS: Labs reviewed: Acceptable for surgery. (all labs ordered are listed, but only abnormal results are displayed)  Labs Reviewed  CBC - Abnormal; Notable for the following components:      Result Value   Hemoglobin 11.3 (*)    MCH 24.2 (*)    MCHC 28.4 (*)    RDW 17.2 (*)    All other components within normal limits  SURGICAL PCR SCREEN     IMAGES:   EKG: 04/30/18 Rate 70 bpm Sinus rhythm   CV: Echo 08/26/18 IMPRESSIONS   1. The left ventricle has normal systolic function with an ejection fraction of 60-65%. The cavity size was normal. There is mildly increased left ventricular wall thickness. Left ventricular diastolic Doppler parameters are consistent with  pseudonormalization Indeterminent filling pressures.  2. The right ventricle has normal systolic function. The cavity was normal. There is no increase in right ventricular wall thickness.  3. The mitral valve is normal in structure.  4. The tricuspid valve is normal in structure.  5. The aortic valve  is tricuspid.  6. The pulmonic valve was normal in structure. Past Medical History:  Diagnosis Date  . Alpha thalassemia trait   . Fibroadenoma   . Glaucoma    monitored every 54months, right eye worse than left   . History of positive PPD, treatment status unknown   . Hypertension   . Thyroid disease     Past Surgical History:  Procedure Laterality Date  . ABDOMINAL HYSTERECTOMY  1989  . BREAST BIOPSY  2013  . BUNIONECTOMY  10/2014  . CHOLECYSTECTOMY  2017  . COLONOSCOPY    . GALLBLADDER SURGERY    . hallux limi    . LAPAROSCOPY    . TUBAL LIGATION      MEDICATIONS: . albuterol (PROVENTIL HFA;VENTOLIN HFA) 108 (90 Base) MCG/ACT inhaler  . amLODipine (NORVASC) 10 MG tablet  . Ascorbic Acid (VITAMIN C PO)  . aspirin EC 81 MG tablet  . carvedilol (COREG) 25 MG tablet  . cyclobenzaprine (FLEXERIL) 5 MG tablet  . docusate sodium (COLACE) 100 MG capsule  . Latanoprostene Bunod (VYZULTA) 0.024 % SOLN  . levothyroxine (SYNTHROID, LEVOTHROID) 50 MCG tablet  . lisinopril (PRINIVIL,ZESTRIL) 40 MG tablet  . Multiple Vitamin (MULTI-VITAMINS) TABS  . Omega-3 Fatty Acids (FISH OIL PO)  . polyethylene glycol powder (MIRALAX) powder  . spironolactone (ALDACTONE) 25 MG tablet  No current facility-administered medications for this encounter.      Maia Plan [facility] Pre-Surgical Testing [phone] 09/09/18 11:49 AM

## 2018-09-10 ENCOUNTER — Other Ambulatory Visit: Payer: Self-pay | Admitting: *Deleted

## 2018-09-10 NOTE — Patient Outreach (Signed)
[facility]) Care Management  09/10/2018  Shelly Cohen 1961-04-17 [id number]   Preoperative Screening Call Referral received: 09/04/18 Surgery/procedure date: 09/12/18 Insurance: [employer] Health Save Plan  Subjective:  Initial successful telephone call to patient's preferred number in order to complete preoperative screening. 2 HIPAA identifiers verified. Discussed purpose of preoperative call. Patient voices understanding and agrees to call. She states she understands the reasons for the surgery and the expected time of arrival. She says she completed her pre-op testing on 09/08/18 and has no additional questions. She says she expects to be in the hospital 2-3 days.  She states she has completed medical leave paperwork and does have the hospital indemnity benefit and has already contacted  [employer] ans she is aware she will have to file the claim after her surgery. She says she will have 24/7 care at home provided by her husband, sister and daughter, to assist in her recovery. She does not have and is not interested in completing advanced directives at this time. She agrees to a post hospital discharge transition of care call.    Objective:  Per chart review and patient, Narcissus is scheduled for right hip arthroplasty on 09/12/18 at [facility].   Assessment: Preoperative call completed, no preoperative needs identified.   Plan: RNCM will call patient for transition of care outreach within 72 hours of hospital discharge notification.  Barrington Ellison RN,CCM,CDE [place] Management Coordinator Office Phone [phone] Office Fax [phone]

## 2018-09-12 ENCOUNTER — Inpatient Hospital Stay (HOSPITAL_COMMUNITY): Payer: 59

## 2018-09-12 ENCOUNTER — Encounter (HOSPITAL_COMMUNITY): Payer: Self-pay | Admitting: *Deleted

## 2018-09-12 ENCOUNTER — Encounter (HOSPITAL_COMMUNITY)
Admission: RE | Disposition: A | Discharge status: Home / Self Care | Payer: Self-pay | Source: Home / Self Care | Attending: Orthopaedic Surgery

## 2018-09-12 ENCOUNTER — Inpatient Hospital Stay (HOSPITAL_COMMUNITY): Payer: 59 | Admitting: Physician Assistant

## 2018-09-12 ENCOUNTER — Inpatient Hospital Stay (HOSPITAL_COMMUNITY)
Admission: RE | Admit: 2018-09-12 | Discharge: 2018-09-14 | DRG: 470 | Disposition: A | Discharge status: Home / Self Care | Payer: 59 | Attending: Orthopaedic Surgery | Admitting: Orthopaedic Surgery

## 2018-09-12 ENCOUNTER — Other Ambulatory Visit: Payer: Self-pay

## 2018-09-12 ENCOUNTER — Inpatient Hospital Stay (HOSPITAL_COMMUNITY): Payer: 59 | Admitting: Certified Registered Nurse Anesthetist

## 2018-09-12 DIAGNOSIS — Z880 Allergy status to penicillin: Secondary | ICD-10-CM | POA: Diagnosis not present

## 2018-09-12 DIAGNOSIS — Z90710 Acquired absence of both cervix and uterus: Secondary | ICD-10-CM

## 2018-09-12 DIAGNOSIS — Z8261 Family history of arthritis: Secondary | ICD-10-CM | POA: Diagnosis not present

## 2018-09-12 DIAGNOSIS — R531 Weakness: Secondary | ICD-10-CM | POA: Diagnosis not present

## 2018-09-12 DIAGNOSIS — Z8249 Family history of ischemic heart disease and other diseases of the circulatory system: Secondary | ICD-10-CM

## 2018-09-12 DIAGNOSIS — Z471 Aftercare following joint replacement surgery: Secondary | ICD-10-CM | POA: Diagnosis not present

## 2018-09-12 DIAGNOSIS — Z9049 Acquired absence of other specified parts of digestive tract: Secondary | ICD-10-CM

## 2018-09-12 DIAGNOSIS — D563 Thalassemia minor: Secondary | ICD-10-CM | POA: Diagnosis present

## 2018-09-12 DIAGNOSIS — Z888 Allergy status to other drugs, medicaments and biological substances status: Secondary | ICD-10-CM

## 2018-09-12 DIAGNOSIS — R7611 Nonspecific reaction to tuberculin skin test without active tuberculosis: Secondary | ICD-10-CM | POA: Diagnosis present

## 2018-09-12 DIAGNOSIS — I10 Essential (primary) hypertension: Secondary | ICD-10-CM | POA: Diagnosis present

## 2018-09-12 DIAGNOSIS — Z96641 Presence of right artificial hip joint: Secondary | ICD-10-CM | POA: Diagnosis not present

## 2018-09-12 DIAGNOSIS — E039 Hypothyroidism, unspecified: Secondary | ICD-10-CM | POA: Diagnosis not present

## 2018-09-12 DIAGNOSIS — H401130 Primary open-angle glaucoma, bilateral, stage unspecified: Secondary | ICD-10-CM | POA: Diagnosis present

## 2018-09-12 DIAGNOSIS — M1611 Unilateral primary osteoarthritis, right hip: Secondary | ICD-10-CM | POA: Diagnosis not present

## 2018-09-12 DIAGNOSIS — Z83511 Family history of glaucoma: Secondary | ICD-10-CM

## 2018-09-12 DIAGNOSIS — D509 Iron deficiency anemia, unspecified: Secondary | ICD-10-CM | POA: Diagnosis not present

## 2018-09-12 DIAGNOSIS — Z9851 Tubal ligation status: Secondary | ICD-10-CM

## 2018-09-12 DIAGNOSIS — E669 Obesity, unspecified: Secondary | ICD-10-CM | POA: Diagnosis present

## 2018-09-12 DIAGNOSIS — Z801 Family history of malignant neoplasm of trachea, bronchus and lung: Secondary | ICD-10-CM

## 2018-09-12 DIAGNOSIS — Z9181 History of falling: Secondary | ICD-10-CM

## 2018-09-12 DIAGNOSIS — Z6835 Body mass index (BMI) 35.0-35.9, adult: Secondary | ICD-10-CM

## 2018-09-12 DIAGNOSIS — Z806 Family history of leukemia: Secondary | ICD-10-CM | POA: Diagnosis not present

## 2018-09-12 DIAGNOSIS — Z419 Encounter for procedure for purposes other than remedying health state, unspecified: Secondary | ICD-10-CM

## 2018-09-12 DIAGNOSIS — Z881 Allergy status to other antibiotic agents status: Secondary | ICD-10-CM

## 2018-09-12 HISTORY — PX: TOTAL HIP ARTHROPLASTY: SHX124

## 2018-09-12 SURGERY — ARTHROPLASTY, HIP, TOTAL, ANTERIOR APPROACH
Anesthesia: Spinal | Site: Hip | Laterality: Right

## 2018-09-12 MED ORDER — ONDANSETRON HCL 4 MG/2ML IJ SOLN
INTRAMUSCULAR | Status: DC | PRN
  Administered 2018-09-12: 4 mg via INTRAVENOUS

## 2018-09-12 MED ORDER — PHENYLEPHRINE HCL 10 MG/ML IJ SOLN
INTRAMUSCULAR | Status: AC
  Filled 2018-09-12: qty 1

## 2018-09-12 MED ORDER — MIDAZOLAM HCL 5 MG/5ML IJ SOLN
INTRAMUSCULAR | Status: DC | PRN
  Administered 2018-09-12: 2 mg via INTRAVENOUS

## 2018-09-12 MED ORDER — TRANEXAMIC ACID-NACL 1000-0.7 MG/100ML-% IV SOLN
1000.0000 mg | INTRAVENOUS | Status: AC
  Administered 2018-09-12: 1000 mg via INTRAVENOUS
  Filled 2018-09-12: qty 100

## 2018-09-12 MED ORDER — GABAPENTIN 100 MG PO CAPS
100.0000 mg | ORAL_CAPSULE | Freq: Three times a day (TID) | ORAL | Status: DC
  Administered 2018-09-12 – 2018-09-14 (×6): 100 mg via ORAL
  Filled 2018-09-12 (×6): qty 1

## 2018-09-12 MED ORDER — METHOCARBAMOL 500 MG PO TABS
500.0000 mg | ORAL_TABLET | Freq: Four times a day (QID) | ORAL | Status: DC | PRN
  Administered 2018-09-12 – 2018-09-14 (×6): 500 mg via ORAL
  Filled 2018-09-12 (×6): qty 1

## 2018-09-12 MED ORDER — LISINOPRIL 20 MG PO TABS
40.0000 mg | ORAL_TABLET | Freq: Every day | ORAL | Status: DC
  Administered 2018-09-12 – 2018-09-14 (×3): 40 mg via ORAL
  Filled 2018-09-12 (×3): qty 2

## 2018-09-12 MED ORDER — HYDROMORPHONE HCL 1 MG/ML IJ SOLN
0.5000 mg | INTRAMUSCULAR | Status: DC | PRN
  Administered 2018-09-12 (×2): 1 mg via INTRAVENOUS
  Filled 2018-09-12 (×2): qty 1

## 2018-09-12 MED ORDER — LATANOPROSTENE BUNOD 0.024 % OP SOLN: 1.0000 [drp] | Freq: Two times a day (BID) | OPHTHALMIC | Status: DC

## 2018-09-12 MED ORDER — PANTOPRAZOLE SODIUM 40 MG PO TBEC
40.0000 mg | DELAYED_RELEASE_TABLET | Freq: Every day | ORAL | Status: DC
  Administered 2018-09-13 – 2018-09-14 (×2): 40 mg via ORAL
  Filled 2018-09-12 (×2): qty 1

## 2018-09-12 MED ORDER — MIDAZOLAM HCL 2 MG/2ML IJ SOLN
INTRAMUSCULAR | Status: AC
  Filled 2018-09-12: qty 2

## 2018-09-12 MED ORDER — ONDANSETRON HCL 4 MG/2ML IJ SOLN
INTRAMUSCULAR | Status: AC
  Filled 2018-09-12: qty 2

## 2018-09-12 MED ORDER — PHENYLEPHRINE 40 MCG/ML (10ML) SYRINGE FOR IV PUSH (FOR BLOOD PRESSURE SUPPORT)
PREFILLED_SYRINGE | INTRAVENOUS | Status: DC | PRN
  Administered 2018-09-12 (×2): 80 ug via INTRAVENOUS

## 2018-09-12 MED ORDER — METOCLOPRAMIDE HCL 5 MG/ML IJ SOLN
5.0000 mg | Freq: Three times a day (TID) | INTRAMUSCULAR | Status: DC | PRN
  Administered 2018-09-12: 10 mg via INTRAVENOUS
  Filled 2018-09-12: qty 2

## 2018-09-12 MED ORDER — BUPIVACAINE HCL (PF) 0.75 % IJ SOLN
INTRAMUSCULAR | Status: DC | PRN
  Administered 2018-09-12: 2 mL via INTRATHECAL

## 2018-09-12 MED ORDER — FENTANYL CITRATE (PF) 100 MCG/2ML IJ SOLN
INTRAMUSCULAR | Status: AC
  Filled 2018-09-12: qty 2

## 2018-09-12 MED ORDER — ASPIRIN 81 MG PO CHEW
81.0000 mg | CHEWABLE_TABLET | Freq: Two times a day (BID) | ORAL | Status: DC
  Administered 2018-09-12 – 2018-09-14 (×4): 81 mg via ORAL
  Filled 2018-09-12 (×4): qty 1

## 2018-09-12 MED ORDER — METHOCARBAMOL 500 MG IVPB - SIMPLE MED
500.0000 mg | Freq: Four times a day (QID) | INTRAVENOUS | Status: DC | PRN
  Administered 2018-09-12: 500 mg via INTRAVENOUS
  Filled 2018-09-12: qty 50

## 2018-09-12 MED ORDER — OXYCODONE HCL 5 MG PO TABS
5.0000 mg | ORAL_TABLET | ORAL | Status: DC | PRN
  Administered 2018-09-12 – 2018-09-13 (×3): 5 mg via ORAL
  Administered 2018-09-14 (×2): 10 mg via ORAL
  Filled 2018-09-12 (×2): qty 1

## 2018-09-12 MED ORDER — STERILE WATER FOR IRRIGATION IR SOLN
Status: DC | PRN
  Administered 2018-09-12 (×2): 1000 mL

## 2018-09-12 MED ORDER — SODIUM CHLORIDE 0.9 % IV SOLN
INTRAVENOUS | Status: DC | PRN
  Administered 2018-09-12: 40 ug/min via INTRAVENOUS

## 2018-09-12 MED ORDER — PHENOL 1.4 % MT LIQD: 1.0000 | OROMUCOSAL | Status: DC | PRN

## 2018-09-12 MED ORDER — ONDANSETRON HCL 4 MG PO TABS
4.0000 mg | ORAL_TABLET | Freq: Four times a day (QID) | ORAL | Status: DC | PRN
  Administered 2018-09-13: 4 mg via ORAL
  Filled 2018-09-12: qty 1

## 2018-09-12 MED ORDER — ALUM & MAG HYDROXIDE-SIMETH 200-200-20 MG/5ML PO SUSP: 30.0000 mL | ORAL | Status: DC | PRN

## 2018-09-12 MED ORDER — MENTHOL 3 MG MT LOZG: 1.0000 | LOZENGE | OROMUCOSAL | Status: DC | PRN

## 2018-09-12 MED ORDER — PROPOFOL 10 MG/ML IV BOLUS
INTRAVENOUS | Status: AC
  Filled 2018-09-12: qty 60

## 2018-09-12 MED ORDER — ALBUTEROL SULFATE (2.5 MG/3ML) 0.083% IN NEBU: 2.5000 mg | INHALATION_SOLUTION | Freq: Four times a day (QID) | RESPIRATORY_TRACT | Status: DC | PRN

## 2018-09-12 MED ORDER — VANCOMYCIN HCL 1000 MG IV SOLR
1000.0000 mg | Freq: Two times a day (BID) | INTRAVENOUS | Status: AC
  Administered 2018-09-12: 1000 mg via INTRAVENOUS
  Filled 2018-09-12: qty 1000

## 2018-09-12 MED ORDER — DEXAMETHASONE SODIUM PHOSPHATE 10 MG/ML IJ SOLN
INTRAMUSCULAR | Status: DC | PRN
  Administered 2018-09-12: 10 mg via INTRAVENOUS

## 2018-09-12 MED ORDER — HYDROMORPHONE HCL 1 MG/ML IJ SOLN
0.2500 mg | INTRAMUSCULAR | Status: DC | PRN
  Administered 2018-09-12 (×4): 0.5 mg via INTRAVENOUS

## 2018-09-12 MED ORDER — SODIUM CHLORIDE 0.9 % IV SOLN
INTRAVENOUS | Status: DC
  Administered 2018-09-12: 14:00:00 via INTRAVENOUS

## 2018-09-12 MED ORDER — SPIRONOLACTONE 25 MG PO TABS
25.0000 mg | ORAL_TABLET | Freq: Every day | ORAL | Status: DC
  Administered 2018-09-12 – 2018-09-14 (×3): 25 mg via ORAL
  Filled 2018-09-12 (×3): qty 1

## 2018-09-12 MED ORDER — ZOLPIDEM TARTRATE 5 MG PO TABS: 5.0000 mg | ORAL_TABLET | Freq: Every evening | ORAL | Status: DC | PRN

## 2018-09-12 MED ORDER — FENTANYL CITRATE (PF) 100 MCG/2ML IJ SOLN
INTRAMUSCULAR | Status: DC | PRN
  Administered 2018-09-12 (×2): 50 ug via INTRAVENOUS

## 2018-09-12 MED ORDER — PROPOFOL 500 MG/50ML IV EMUL
INTRAVENOUS | Status: DC | PRN
  Administered 2018-09-12: 100 ug/kg/min via INTRAVENOUS

## 2018-09-12 MED ORDER — SODIUM CHLORIDE 0.9 % IR SOLN
Status: DC | PRN
  Administered 2018-09-12: 1000 mL

## 2018-09-12 MED ORDER — DEXAMETHASONE SODIUM PHOSPHATE 10 MG/ML IJ SOLN
INTRAMUSCULAR | Status: AC
  Filled 2018-09-12: qty 1

## 2018-09-12 MED ORDER — DIPHENHYDRAMINE HCL 12.5 MG/5ML PO ELIX: 12.5000 mg | ORAL_SOLUTION | ORAL | Status: DC | PRN

## 2018-09-12 MED ORDER — ACETAMINOPHEN 325 MG PO TABS: 325.0000 mg | ORAL_TABLET | Freq: Four times a day (QID) | ORAL | Status: DC | PRN

## 2018-09-12 MED ORDER — METHOCARBAMOL 500 MG IVPB - SIMPLE MED
INTRAVENOUS | Status: AC
  Filled 2018-09-12: qty 50

## 2018-09-12 MED ORDER — ONDANSETRON HCL 4 MG/2ML IJ SOLN
4.0000 mg | Freq: Four times a day (QID) | INTRAMUSCULAR | Status: DC | PRN
  Administered 2018-09-12: 4 mg via INTRAVENOUS
  Filled 2018-09-12: qty 2

## 2018-09-12 MED ORDER — LEVOTHYROXINE SODIUM 50 MCG PO TABS
50.0000 ug | ORAL_TABLET | Freq: Every day | ORAL | Status: DC
  Administered 2018-09-13 – 2018-09-14 (×2): 50 ug via ORAL
  Filled 2018-09-12 (×2): qty 1

## 2018-09-12 MED ORDER — LACTATED RINGERS IV SOLN
INTRAVENOUS | Status: DC
  Administered 2018-09-12: 08:00:00 via INTRAVENOUS

## 2018-09-12 MED ORDER — HYDROMORPHONE HCL 1 MG/ML IJ SOLN
INTRAMUSCULAR | Status: AC
  Filled 2018-09-12: qty 1

## 2018-09-12 MED ORDER — CHLORHEXIDINE GLUCONATE 4 % EX LIQD: 60.0000 mL | Freq: Once | CUTANEOUS | Status: DC

## 2018-09-12 MED ORDER — PHENYLEPHRINE 40 MCG/ML (10ML) SYRINGE FOR IV PUSH (FOR BLOOD PRESSURE SUPPORT)
PREFILLED_SYRINGE | INTRAVENOUS | Status: AC
  Filled 2018-09-12: qty 10

## 2018-09-12 MED ORDER — METOCLOPRAMIDE HCL 5 MG PO TABS: 5.0000 mg | ORAL_TABLET | Freq: Three times a day (TID) | ORAL | Status: DC | PRN

## 2018-09-12 MED ORDER — AMLODIPINE BESYLATE 10 MG PO TABS
10.0000 mg | ORAL_TABLET | Freq: Every day | ORAL | Status: DC
  Administered 2018-09-13 – 2018-09-14 (×2): 10 mg via ORAL
  Filled 2018-09-12 (×2): qty 1

## 2018-09-12 MED ORDER — VANCOMYCIN HCL IN DEXTROSE 1-5 GM/200ML-% IV SOLN
1000.0000 mg | INTRAVENOUS | Status: AC
  Administered 2018-09-12: 1000 mg via INTRAVENOUS
  Filled 2018-09-12: qty 200

## 2018-09-12 MED ORDER — DOCUSATE SODIUM 100 MG PO CAPS
100.0000 mg | ORAL_CAPSULE | Freq: Two times a day (BID) | ORAL | Status: DC
  Administered 2018-09-12 – 2018-09-14 (×4): 100 mg via ORAL
  Filled 2018-09-12 (×4): qty 1

## 2018-09-12 MED ORDER — CARVEDILOL 25 MG PO TABS
25.0000 mg | ORAL_TABLET | Freq: Two times a day (BID) | ORAL | Status: DC
  Administered 2018-09-12 – 2018-09-14 (×4): 25 mg via ORAL
  Filled 2018-09-12 (×4): qty 1

## 2018-09-12 MED ORDER — POLYETHYLENE GLYCOL 3350 17 G PO PACK: 17.0000 g | PACK | Freq: Every day | ORAL | Status: DC | PRN

## 2018-09-12 MED ORDER — LATANOPROST 0.005 % OP SOLN
1.0000 [drp] | Freq: Every day | OPHTHALMIC | Status: DC
  Administered 2018-09-12 – 2018-09-13 (×2): 1 [drp] via OPHTHALMIC
  Filled 2018-09-12: qty 2.5

## 2018-09-12 MED ORDER — 0.9 % SODIUM CHLORIDE (POUR BTL) OPTIME
TOPICAL | Status: DC | PRN
  Administered 2018-09-12: 1000 mL

## 2018-09-12 MED ORDER — OXYCODONE HCL 5 MG PO TABS
10.0000 mg | ORAL_TABLET | ORAL | Status: DC | PRN
  Administered 2018-09-12 (×2): 15 mg via ORAL
  Administered 2018-09-12 – 2018-09-13 (×2): 10 mg via ORAL
  Administered 2018-09-13: 15 mg via ORAL
  Filled 2018-09-12 (×3): qty 3
  Filled 2018-09-12 (×2): qty 2
  Filled 2018-09-12 (×2): qty 3

## 2018-09-12 SURGICAL SUPPLY — 39 items
BAG ZIPLOCK 12X15 (MISCELLANEOUS) IMPLANT
BENZOIN TINCTURE PRP APPL 2/3 (GAUZE/BANDAGES/DRESSINGS) IMPLANT
BLADE SAW SGTL 18X1.27X75 (BLADE) ×2 IMPLANT
BLADE SURG SZ10 CARB STEEL (BLADE) ×4 IMPLANT
CHLORAPREP W/TINT 26 (MISCELLANEOUS) ×2 IMPLANT
COVER PERINEAL POST (MISCELLANEOUS) ×2 IMPLANT
COVER SURGICAL LIGHT HANDLE (MISCELLANEOUS) ×2 IMPLANT
COVER WAND RF STERILE (DRAPES) IMPLANT
CUP SECTOR GRIPTON 50MM (Cup) ×2 IMPLANT
DRAPE STERI IOBAN 125X83 (DRAPES) ×2 IMPLANT
DRAPE U-SHAPE 47X51 STRL (DRAPES) ×4 IMPLANT
DRSG AQUACEL AG ADV 3.5X10 (GAUZE/BANDAGES/DRESSINGS) ×2 IMPLANT
ELECT BLADE TIP CTD 4 INCH (ELECTRODE) ×2 IMPLANT
ELECT REM PT RETURN 15FT ADLT (MISCELLANEOUS) ×2 IMPLANT
GAUZE XEROFORM 1X8 LF (GAUZE/BANDAGES/DRESSINGS) IMPLANT
GLOVE BIO SURGEON STRL SZ7.5 (GLOVE) ×2 IMPLANT
GLOVE BIOGEL PI IND STRL 8 (GLOVE) ×2 IMPLANT
GLOVE BIOGEL PI INDICATOR 8 (GLOVE) ×2
GLOVE ECLIPSE 8.0 STRL XLNG CF (GLOVE) ×2 IMPLANT
GOWN STRL REUS W/TWL XL LVL3 (GOWN DISPOSABLE) ×4 IMPLANT
HANDPIECE INTERPULSE COAX TIP (DISPOSABLE) ×1
HEAD FEM STD 32X+1 STRL (Hips) ×2 IMPLANT
HOLDER FOLEY CATH W/STRAP (MISCELLANEOUS) ×2 IMPLANT
KIT TURNOVER KIT A (KITS) IMPLANT
LINER ACETABULAR 32X50 (Liner) ×2 IMPLANT
PACK ANTERIOR HIP CUSTOM (KITS) ×2 IMPLANT
SCREW 6.5MMX25MM (Screw) ×2 IMPLANT
SET HNDPC FAN SPRY TIP SCT (DISPOSABLE) ×1 IMPLANT
STAPLER VISISTAT 35W (STAPLE) IMPLANT
STEM FEM ACTIS STD SZ2 (Stem) ×2 IMPLANT
STRIP CLOSURE SKIN 1/2X4 (GAUZE/BANDAGES/DRESSINGS) IMPLANT
SUT ETHIBOND NAB CT1 #1 30IN (SUTURE) ×2 IMPLANT
SUT MNCRL AB 4-0 PS2 18 (SUTURE) IMPLANT
SUT VIC AB 0 CT1 36 (SUTURE) ×2 IMPLANT
SUT VIC AB 1 CT1 36 (SUTURE) ×2 IMPLANT
SUT VIC AB 2-0 CT1 27 (SUTURE) ×2
SUT VIC AB 2-0 CT1 TAPERPNT 27 (SUTURE) ×2 IMPLANT
TRAY FOLEY MTR SLVR 16FR STAT (SET/KITS/TRAYS/PACK) ×2 IMPLANT
YANKAUER SUCT BULB TIP 10FT TU (MISCELLANEOUS) ×2 IMPLANT

## 2018-09-12 NOTE — Anesthesia Procedure Notes (Signed)
Spinal  Patient location during procedure: OR Start time: 09/12/2018 10:07 AM End time: 09/12/2018 10:11 AM Staffing Resident/CRNA: Claudia Desanctis, CRNA Performed: resident/CRNA  Preanesthetic Checklist Completed: patient identified, site marked, surgical consent, pre-op evaluation, timeout performed, IV checked, risks and benefits discussed and monitors and equipment checked Spinal Block Patient position: sitting Prep: DuraPrep Patient monitoring: heart rate, cardiac monitor, continuous pulse ox and blood pressure Approach: midline Location: L3-4 Injection technique: single-shot Needle Needle type: Sprotte and Pencan  Needle gauge: 24 G Needle length: 10 cm Needle insertion depth: 8 cm Assessment Sensory level: T4

## 2018-09-12 NOTE — Transfer of Care (Signed)
Immediate Anesthesia Transfer of Care Note  Patient: Shelly Cohen  Procedure(s) Performed: RIGHT TOTAL HIP ARTHROPLASTY ANTERIOR APPROACH (Right Hip)  Patient Location: PACU  Anesthesia Type:Spinal  Level of Consciousness: drowsy  Airway & Oxygen Therapy: Patient Spontanous Breathing and Patient connected to face mask  Post-op Assessment: Report given to RN and Post -op Vital signs reviewed and stable  Post vital signs: Reviewed and stable  Last Vitals:  Vitals Value Taken Time  BP 84/48 09/12/2018 11:37 AM  Temp    Pulse 66 09/12/2018 11:39 AM  Resp 11 09/12/2018 11:39 AM  SpO2 100 % 09/12/2018 11:39 AM  Vitals shown include unvalidated device data.  Last Pain:  Vitals:   09/12/18 0730  TempSrc: Oral         Complications: No apparent anesthesia complications

## 2018-09-12 NOTE — Brief Op Note (Signed)
09/12/2018  11:17 AM  PATIENT:  Shelly Cohen  59 y.o. female  PRE-OPERATIVE DIAGNOSIS:  OSTEOARTHRITIS RIGHT HIP  POST-OPERATIVE DIAGNOSIS:  OSTEOARTHRITIS RIGHT HIP  PROCEDURE:  Procedure(s): RIGHT TOTAL HIP ARTHROPLASTY ANTERIOR APPROACH (Right)  SURGEON:  Surgeon(s) and Role:    * Mcarthur Rossetti, MD - Primary  PHYSICIAN ASSISTANT:  Benita Stabile, PA-C   ANESTHESIA:   spinal  EBL:  150 mL   COUNTS:  YES  DICTATION: .Other Dictation: Dictation Number [id number]  PLAN OF CARE: Admit to inpatient   PATIENT DISPOSITION:  PACU - hemodynamically stable.   Delay start of Pharmacological VTE agent (>24hrs) due to surgical blood loss or risk of bleeding: no

## 2018-09-12 NOTE — Evaluation (Signed)
Physical Therapy Evaluation Patient Details Name: Shelly Cohen MRN: [id number] DOB: Jun 25, 1961 Today's Date: 09/12/2018   History of Present Illness  R THA-AA  Clinical Impression  Pt is s/p THA resulting in the deficits listed below (see PT Problem List). Mod assist supine to sit, pt ambulated 8' with RW, distance limited by onset of dizziness. BP 131/68 in sitting, HR 75, SaO2 97% on room air. Good progress expected.  Pt will benefit from skilled PT to increase their independence and safety with mobility to allow discharge to the venue listed below.      Follow Up Recommendations No PT follow up    Equipment Recommendations  None recommended by PT    Recommendations for Other Services       Precautions / Restrictions Precautions Precautions: Fall Restrictions Weight Bearing Restrictions: No Other Position/Activity Restrictions: WBAT      Mobility  Bed Mobility Overal bed mobility: Needs Assistance Bed Mobility: Supine to Sit     Supine to sit: +2 for physical assistance;Mod assist     General bed mobility comments: assist to raise trunk and support RLE  Transfers Overall transfer level: Needs assistance Equipment used: Rolling walker (2 wheeled) Transfers: Sit to/from Stand Sit to Stand: Min assist;+2 physical assistance;+2 safety/equipment;From elevated surface         General transfer comment: VCs hand placement  Ambulation/Gait Ambulation/Gait assistance: +2 safety/equipment;Min guard Gait Distance (Feet): 8 Feet Assistive device: Rolling walker (2 wheeled) Gait Pattern/deviations: Step-to pattern;Decreased weight shift to right;Decreased step length - left;Decreased step length - right Gait velocity: decr   General Gait Details: VCs sequencing, distance limited by onset of lightheadedness, assisted to recliner, BP 131/68, HR 70s, SaO2 97% on room air  Stairs            Wheelchair Mobility    Modified Rankin (Stroke Patients Only)        Balance Overall balance assessment: Modified Independent                                           Pertinent Vitals/Pain Pain Assessment: 0-10 Pain Score: 7  Pain Location: R hip Pain Descriptors / Indicators: Burning Pain Intervention(s): Limited activity within patient's tolerance;Monitored during session;Premedicated before session;Ice applied    Home Living Family/patient expects to be discharged to:: Private residence Living Arrangements: Spouse/significant other Available Help at Discharge: Family;Available 24 hours/day Type of Home: House Home Access: Level entry     Home Layout: One level Home Equipment: Walker - 2 wheels;Bedside commode;Tub bench      Prior Function Level of Independence: Independent         Comments: denies falls in past 1 year     Hand Dominance        Extremity/Trunk Assessment   Upper Extremity Assessment Upper Extremity Assessment: Overall WFL for tasks assessed    Lower Extremity Assessment Lower Extremity Assessment: RLE deficits/detail RLE Deficits / Details: hip AAROM decr 50% by pain RLE Sensation: WNL    Cervical / Trunk Assessment Cervical / Trunk Assessment: Normal  Communication   Communication: No difficulties  Cognition Arousal/Alertness: Awake/alert Behavior During Therapy: WFL for tasks assessed/performed Overall Cognitive Status: Within Functional Limits for tasks assessed  General Comments      Exercises Total Joint Exercises Ankle Circles/Pumps: AROM;Both;10 reps;Supine Heel Slides: AAROM;Right;10 reps;Supine Hip ABduction/ADduction: AAROM;Right;10 reps;Supine   Assessment/Plan    PT Assessment Patient needs continued PT services  PT Problem List Decreased strength;Decreased range of motion;Decreased activity tolerance;Decreased mobility;Pain;Decreased knowledge of use of DME       PT Treatment Interventions DME  instruction;Gait training;Functional mobility training;Therapeutic exercise;Patient/family education;Therapeutic activities    PT Goals (Current goals can be found in the Care Plan section)  Acute Rehab PT Goals Patient Stated Goal: to walk for exercise PT Goal Formulation: With patient Time For Goal Achievement: 09/19/18 Potential to Achieve Goals: Good    Frequency 7X/week   Barriers to discharge        Co-evaluation               AM-PAC PT "6 Clicks" Mobility  Outcome Measure Help needed turning from your back to your side while in a flat bed without using bedrails?: A Little Help needed moving from lying on your back to sitting on the side of a flat bed without using bedrails?: A Lot Help needed moving to and from a bed to a chair (including a wheelchair)?: A Lot Help needed standing up from a chair using your arms (e.g., wheelchair or bedside chair)?: A Lot Help needed to walk in hospital room?: A Little Help needed climbing 3-5 steps with a railing? : A Lot 6 Click Score: 14    End of Session Equipment Utilized During Treatment: Gait belt Activity Tolerance: Patient tolerated treatment well Patient left: in chair;with call bell/phone within reach;with family/visitor present Nurse Communication: Mobility status PT Visit Diagnosis: Pain;Difficulty in walking, not elsewhere classified (R26.2) Pain - Right/Left: Right Pain - part of body: Hip    Time: 6433-2951 PT Time Calculation (min) (ACUTE ONLY): 25 min   Charges:   PT Evaluation $PT Eval Low Complexity: 1 Low PT Treatments $Gait Training: 8-22 mins       Blondell Reveal Kistler PT 09/12/2018  Acute Rehabilitation Services Pager [phone] Office [phone]

## 2018-09-12 NOTE — Anesthesia Procedure Notes (Signed)
Procedure Name: MAC Date/Time: 09/12/2018 10:10 AM Performed by: Claudia Desanctis, CRNA Pre-anesthesia Checklist: Patient identified, Emergency Drugs available, Suction available and Patient being monitored Oxygen Delivery Method: Simple face mask

## 2018-09-12 NOTE — Op Note (Signed)
NAMEGENNETT, GARCIA MEDICAL RECORD [id number] ACCOUNT [id number] DATE OF BIRTH:16-Dec-1960 FACILITY: [facility] LOCATION: [facility] PHYSICIAN:Amandalee Lacap Kerry [place], MD  OPERATIVE REPORT  DATE OF PROCEDURE:  09/12/2018  PREOPERATIVE DIAGNOSIS:  Severe osteoarthritis and degenerative joint disease, right hip.  POSTOPERATIVE DIAGNOSIS:  Severe osteoarthritis and degenerative joint disease, right hip.  PROCEDURE:  Right total hip arthroplasty through direct anterior approach.  IMPLANTS:  DePuy Sector Gription acetabular component size 50, with a single screw, size 32+0 neutral polyethylene liner, size 2 Actis femoral component with standard offset, size 32+1 metal hip ball.  SURGEON:  Lind Guest. Ninfa Linden, MD  ASSISTANT:  Erskine Emery, PA-C  ANESTHESIA:  Spinal.  ANTIBIOTICS:  One gram IV vancomycin.  ESTIMATED BLOOD LOSS:  150 mL.  COMPLICATIONS:  None.  INDICATIONS:  The patient is a 59 year old female with a BMI of between 35 and 40, but also has severe debilitating arthritis involving her right hip.  Her pain is daily and it has detrimentally affected her mobility her quality of life and activities of  daily living to the point she does wish to proceed with total hip arthroplasty.  She has worked on activity modification and weight loss.  Her pain is daily and is detrimentally affecting her mobility, her activities of daily living, and her quality of  life to the point she does wish to proceed with the surgery.  She understands that there is certain risk of acute blood loss anemia, nerve or vessel injury, fracture, infection, DVT, dislocation, implant failure.  She understands these are all heightened  risks given her obesity.  She understands our goals are also to decrease pain, improve mobility and overall improve quality of life.  DESCRIPTION OF PROCEDURE:  After informed consent was obtained and appropriate right hip was marked, she was brought to the operating room and  sat up on a stretcher where spinal anesthesia was then obtained.  She was then laid in the supine position on a  stretcher.  Foley catheter was placed and I was able to assess her leg lengths and found that her operative site was just slightly shorter on the right side than her left nonoperative side.  I placed traction boots on both her feet and placed her supine  on the Hana fracture table, the perineal post in place and both legs in line skeletal traction device and no traction applied.  Her right operative hip was prepped and draped with DuraPrep and sterile drapes.  A time-out was called to identify correct  patient, correct right hip.  I then made an incision just inferior and posterior to the anterior superior iliac spine and carried this obliquely down the leg.  We dissected down tensor fascia lata muscle.  Tensor fascia was then divided longitudinally to  proceed with direct anterior approach to the hip.  We identified and cauterized circumflex vessels.  I then identified the hip capsule, opened the hip capsule in an L-type format, finding moderate joint effusion and significant periarticular osteophytes  around the femoral head and neck.  We then placed a Cobra retractor around the medial and lateral femoral neck and made our femoral neck cut with an oscillating saw and completed this with an osteotome.  We placed a corkscrew guide in the femoral head  and removed the femoral heads entirety and found a wide area devoid of cartilage.  It was very small femoral head surprisingly, given her obesity.  I then placed a bent Hohmann over the medial acetabular rim and removed remnants of the  acetabular labrum  and other debris.  We then began reaming under direct visualization from a size 44 reamer, going up to a size 49 with all reamers under direct visualization, the last reamer placed under direct fluoroscopy, so I could also obtain my depth of reaming by  inclination and anteversion.  I then placed the  real DePuy Sector Gription acetabular component size 50 and a single screw and a 36+0 neutral polyethylene liner for that size acetabular component.  Attention was then turned to the femur.  With the leg  externally rotated to 120 degrees, extended and adducted, we placed a Mueller retractor medially and Hohman retractor behind the greater trochanter, released lateral joint capsule and used a box-cutting osteotome to enter femoral canal and a rongeur to  lateralize then began broaching using Corail broaching system from a size zero broach, going up to only a size with a size 2.  The size 2 had a nice fill of her canal.  We placed a 32+1 hip ball and reduced this in the acetabulum.  We were pleased with  the leg length, offset, range of motion and stability.  We then dislocated the hip and removed the trial components.  I placed the real Actis femoral component size 2 and the real 32+1 metal hip ball and again reduced this in the acetabulum.  We were  pleased with the leg length, offset, range of motion and stability assessed manually and radiographically.  We then irrigated the soft tissue with normal saline solution using pulsatile lavage.  We closed the joint capsule with interrupted #1 Ethibond  suture, followed by running #1 Vicryl and tensor fascia, 0 Vicryl was used to close the deep tissue, 2-0 Vicryl was used to close the subcutaneous tissue and interrupted staples used to close the skin.  Xeroform and Aquacel dressing was applied.  She was  taken off the Hana table and taken to recovery room in stable condition.  All final counts were correct.  There were no complications noted.  Of note, Benita Stabile, PA-C, assisted the entire case.  His assistance was crucial for facilitating all aspects of  this case.  TN/NUANCE  D:09/12/2018 T:09/12/2018 JOB:[id number]

## 2018-09-12 NOTE — H&P (Signed)
TOTAL HIP ADMISSION H&P  Patient is admitted for right total hip arthroplasty.  Subjective:  Chief Complaint: right hip pain  HPI: Shelly Cohen, 58 y.o. female, has a history of pain and functional disability in the right hip(s) due to arthritis and patient has failed non-surgical conservative treatments for greater than 12 weeks to include NSAID's and/or analgesics, corticosteriod injections, flexibility and strengthening excercises, use of assistive devices, weight reduction as appropriate and activity modification.  Onset of symptoms was gradual starting 2 years ago with gradually worsening course since that time.The patient noted no past surgery on the right hip(s).  Patient currently rates pain in the right hip at 10 out of 10 with activity. Patient has night pain, worsening of pain with activity and weight bearing, trendelenberg gait, pain that interfers with activities of daily living, pain with passive range of motion and crepitus. Patient has evidence of subchondral cysts, subchondral sclerosis, periarticular osteophytes and joint space narrowing by imaging studies. This condition presents safety issues increasing the risk of falls.  There is no current active infection.  Patient Active Problem List   Diagnosis Date Noted  . Obesity (BMI 30-39.9) 08/22/2018  . Unilateral primary osteoarthritis, right hip 08/21/2018  . Trigger thumb, left thumb 05/07/2018  . OA (osteoarthritis) of hip 04/22/2018  . Open angle primary glaucoma 01/08/2018  . Mass of breast, left 06/29/2016  . Thalassemia trait, alpha 06/29/2016  . Iron deficiency anemia 06/29/2016  . S/P bunionectomy 06/29/2016  . Great toe pain, right 06/29/2016  . Encounter for weight loss counseling 06/29/2016  . HTN (hypertension), benign 05/18/2016  . Hypothyroidism 05/18/2016   Past Medical History:  Diagnosis Date  . Alpha thalassemia trait   . Fibroadenoma   . Glaucoma    monitored every 58months, right eye worse  than left   . History of positive PPD, treatment status unknown   . Hypertension   . Thyroid disease     Past Surgical History:  Procedure Laterality Date  . ABDOMINAL HYSTERECTOMY  1989  . BREAST BIOPSY  2013  . BUNIONECTOMY  10/2014  . CHOLECYSTECTOMY  2017  . COLONOSCOPY    . GALLBLADDER SURGERY    . hallux limi    . LAPAROSCOPY    . TUBAL LIGATION      No current facility-administered medications for this encounter.    Allergies  Allergen Reactions  . Diphenhydramine Hcl Other (See Comments)    Itching  And hallucination   . Hydrocodone Nausea Only  . Azithromycin Itching and Rash  . Cephalexin Rash  . Penicillin G Rash    Did it involve swelling of the face/tongue/throat, SOB, or low BP? No Did it involve sudden or severe rash/hives, skin peeling, or any reaction on the inside of your mouth or nose? No Did you need to seek medical attention at a hospital or doctor's office? No When did it last happen?childhood If all above answers are "NO", may proceed with cephalosporin use.   Marland Kitchen Zolpidem Itching and Rash    Social History   Tobacco Use  . Smoking status: Never Smoker  . Smokeless tobacco: Never Used  Substance Use Topics  . Alcohol use: No    Family History  Problem Relation Age of Onset  . Hypertension Mother   . Anemia Mother   . Arthritis Mother        rheumatoid arthritis  . Kidney disease Mother   . Cataracts Mother   . Heart disease Mother   . Hypertension Sister   .  Cataracts Sister   . Hypertension Brother   . Glaucoma Brother   . Diabetes Brother   . Cancer Maternal Grandmother        leukemia  . Cancer Maternal Grandfather        lung  . Glaucoma Maternal Aunt      Review of Systems  Musculoskeletal: Positive for joint pain.  All other systems reviewed and are negative.   Objective:  Physical Exam  Constitutional: She is oriented to person, place, and time. She appears well-developed and well-nourished.  HENT:  Head:  Normocephalic and atraumatic.  Eyes: Pupils are equal, round, and reactive to light. EOM are normal.  Neck: Normal range of motion. Neck supple.  Cardiovascular: Normal rate.  Respiratory: Effort normal.  GI: Soft.  Musculoskeletal:     Right hip: She exhibits decreased range of motion, decreased strength, tenderness and bony tenderness.  Neurological: She is alert and oriented to person, place, and time.  Skin: Skin is warm and dry.  Psychiatric: She has a normal mood and affect.    Vital signs in last 24 hours:    Labs:   Estimated body mass index is 35.96 kg/m as calculated from the following:   Height as of 09/08/18: 5\' 3"  (1.6 m).   Weight as of 09/08/18: 92.1 kg.   Imaging Review Plain radiographs demonstrate severe degenerative joint disease of the right hip(s). The bone quality appears to be excellent for age and reported activity level.      Assessment/Plan:  End stage arthritis, right hip(s)  The patient history, physical examination, clinical judgement of the provider and imaging studies are consistent with end stage degenerative joint disease of the right hip(s) and total hip arthroplasty is deemed medically necessary. The treatment options including medical management, injection therapy, arthroscopy and arthroplasty were discussed at length. The risks and benefits of total hip arthroplasty were presented and reviewed. The risks due to aseptic loosening, infection, stiffness, dislocation/subluxation,  thromboembolic complications and other imponderables were discussed.  The patient acknowledged the explanation, agreed to proceed with the plan and consent was signed. Patient is being admitted for inpatient treatment for surgery, pain control, PT, OT, prophylactic antibiotics, VTE prophylaxis, progressive ambulation and ADL's and discharge planning.The patient is planning to be discharged home with home health services

## 2018-09-13 LAB — BASIC METABOLIC PANEL
ANION GAP: 10 (ref 5–15)
BUN: 9 mg/dL (ref 6–20)
CO2: 24 mmol/L (ref 22–32)
Calcium: 9 mg/dL (ref 8.9–10.3)
Chloride: 104 mmol/L (ref 98–111)
Creatinine, Ser: 0.8 mg/dL (ref 0.44–1.00)
GFR calc Af Amer: >60 mL/min (ref >60)
GFR calc non Af Amer: >60 mL/min (ref >60)
Glucose, Bld: 153 mg/dL — ABNORMAL HIGH (ref 70–99)
Potassium: 4 mmol/L (ref 3.5–5.1)
Sodium: 138 mmol/L (ref 135–145)

## 2018-09-13 LAB — CBC
HEMATOCRIT: 37.3 % (ref 36.0–46.0)
Hemoglobin: 10.8 g/dL — ABNORMAL LOW (ref 12.0–15.0)
MCH: 24.4 pg — ABNORMAL LOW (ref 26.0–34.0)
MCHC: 29 g/dL — ABNORMAL LOW (ref 30.0–36.0)
MCV: 84.4 fL (ref 80.0–100.0)
Platelets: 305 10*3/uL (ref 150–400)
RBC: 4.42 MIL/uL (ref 3.87–5.11)
RDW: 16.7 % — ABNORMAL HIGH (ref 11.5–15.5)
WBC: 16.7 10*3/uL — ABNORMAL HIGH (ref 4.0–10.5)
nRBC: 0 % (ref 0.0–0.2)

## 2018-09-13 MED ORDER — ASPIRIN 81 MG PO CHEW: 81.0000 mg | CHEWABLE_TABLET | Freq: Two times a day (BID) | ORAL | 0 refills | Status: DC

## 2018-09-13 MED ORDER — OXYCODONE HCL 5 MG PO TABS: 5.0000 mg | ORAL_TABLET | Freq: Four times a day (QID) | ORAL | 0 refills | Status: DC | PRN

## 2018-09-13 MED ORDER — METHOCARBAMOL 500 MG PO TABS: 500.0000 mg | ORAL_TABLET | Freq: Four times a day (QID) | ORAL | 0 refills | Status: DC | PRN

## 2018-09-13 NOTE — TOC Progression Note (Signed)
Transition of Care [facility]) - Progression Note    Patient Details  Name: Shelly Cohen MRN: [id number] Date of Birth: 02/22/1961  [facility] CM/SW Contact  Norina Buzzard, RN Phone Number: 09/13/2018, 11:47 AM  Clinical Narrative:  58 yo F s/p R THA-AA. Received referral to assist with [facility] PT and DME. Pt plans to return home with the support of her husband and daughter. She has a toilet seat and a RW. Discussed preference for a [facility] agency for HHPT. Referral made to [facility] prior to surgery and pt agrees with agency.     Expected Discharge Plan: [place] Barriers to Discharge: No Barriers Identified  Expected Discharge Plan and Services Expected Discharge Plan: [place] Discharge Planning Services: CM Consult Post Acute Care Choice: [place]: PT [place]: [facility] (now [facility])   Social Determinants of Health (SDOH) Interventions    Readmission Risk Interventions 30 Day Unplanned Readmission Risk Score     Admission (Current) from 09/12/2018 in [place]  30 Day Unplanned Readmission Risk Score (%)  11 Filed at 09/13/2018 0800     This score is the patient's risk of an unplanned readmission within 30 days of being discharged (0 -100%). The score is based on dignosis, age, lab data, medications, orders, and past utilization.   Low:  0-14.9   Medium: 15-21.9   High: 22-29.9   Extreme: 30 and above       No flowsheet data found.

## 2018-09-13 NOTE — Plan of Care (Signed)
  Problem: Health Behavior/Discharge Planning: Goal: Ability to manage health-related needs will improve Outcome: Progressing   Problem: Clinical Measurements: Goal: Diagnostic test results will improve Outcome: Progressing   Problem: Clinical Measurements: Goal: Respiratory complications will improve Outcome: Progressing   Problem: Activity: Goal: Risk for activity intolerance will decrease Outcome: Progressing   Problem: Pain Management: Goal: Pain level will decrease with appropriate interventions Outcome: Progressing

## 2018-09-13 NOTE — Progress Notes (Signed)
Physical Therapy Treatment Patient Details Name: Shelly Cohen MRN: [id number] DOB: 1961-01-31 Today's Date: 09/13/2018    History of Present Illness R THA-AA    PT Comments    Pt with improved ambulation distance this session, and pt subjectively reported that her pain is much improved today vs yesterday. Pt tolerated LE exercises well, but requires some physical assist to complete them. PT to continue to progress mobility in afternoon session today.    Follow Up Recommendations  Follow surgeon's recommendation for DC plan and follow-up therapies;Supervision for mobility/OOB     Equipment Recommendations  None recommended by PT    Recommendations for Other Services       Precautions / Restrictions Precautions Precautions: Fall Restrictions Weight Bearing Restrictions: No Other Position/Activity Restrictions: WBAT    Mobility  Bed Mobility Overal bed mobility: Needs Assistance Bed Mobility: Sit to Supine       Sit to supine: Min assist;HOB elevated   General bed mobility comments: Pt up in chair upon PT arrival to room. min assist for LE lifting into bed, PT encouraged pt to use LLE under RLE to assist in bringing legs into bed. Pt able to scoot up in bed without physical assist with increased time.   Transfers Overall transfer level: Needs assistance Equipment used: Rolling walker (2 wheeled) Transfers: Sit to/from Stand Sit to Stand: Min assist         General transfer comment: Min assist for power up and steadying upon standing. Verbal cuing for hand placement when rising.   Ambulation/Gait Ambulation/Gait assistance: Min guard(PT brought recliner in case of lightheadedness/nausea) Gait Distance (Feet): 70 Feet Assistive device: Rolling walker (2 wheeled) Gait Pattern/deviations: Step-to pattern;Decreased stance time - right;Decreased weight shift to right;Antalgic;Trunk flexed Gait velocity: decr    General Gait Details: Min guard for safety.  verbal cuing for sequencing, placement in RW during ambulation, and turning with RW. Pt reporting slight lightheadedness initially, improved with slow ambulation.    Stairs             Wheelchair Mobility    Modified Rankin (Stroke Patients Only)       Balance Overall balance assessment: Mild deficits observed, not formally tested                                          Cognition Arousal/Alertness: Awake/alert Behavior During Therapy: WFL for tasks assessed/performed Overall Cognitive Status: Within Functional Limits for tasks assessed                                        Exercises Total Joint Exercises Ankle Circles/Pumps: Both;10 reps;Supine Quad Sets: AROM;Right;10 reps;Supine Heel Slides: AAROM;Right;10 reps;Supine Hip ABduction/ADduction: AAROM;Right;10 reps;Supine    General Comments        Pertinent Vitals/Pain Pain Assessment: 0-10 Pain Score: 5  Pain Location: R hip Pain Descriptors / Indicators: Sore Pain Intervention(s): Limited activity within patient's tolerance;Premedicated before session;Monitored during session;Repositioned    Home Living                      Prior Function            PT Goals (current goals can now be found in the care plan section) Acute Rehab PT Goals Patient Stated Goal: to walk for exercise PT  Goal Formulation: With patient Time For Goal Achievement: 09/19/18 Potential to Achieve Goals: Good Progress towards PT goals: Progressing toward goals    Frequency    7X/week      PT Plan Current plan remains appropriate    Co-evaluation              AM-PAC PT "6 Clicks" Mobility   Outcome Measure  Help needed turning from your back to your side while in a flat bed without using bedrails?: A Little Help needed moving from lying on your back to sitting on the side of a flat bed without using bedrails?: A Lot Help needed moving to and from a bed to a chair  (including a wheelchair)?: A Little Help needed standing up from a chair using your arms (e.g., wheelchair or bedside chair)?: A Little Help needed to walk in hospital room?: A Little Help needed climbing 3-5 steps with a railing? : A Lot 6 Click Score: 16    End of Session Equipment Utilized During Treatment: Gait belt Activity Tolerance: Patient tolerated treatment well Patient left: with call bell/phone within reach;in bed;with bed alarm set;with SCD's reapplied Nurse Communication: Mobility status PT Visit Diagnosis: Pain;Difficulty in walking, not elsewhere classified (R26.2) Pain - Right/Left: Right Pain - part of body: Hip     Time: 3893-7342 PT Time Calculation (min) (ACUTE ONLY): 25 min  Charges:  $Gait Training: 8-22 mins $Therapeutic Exercise: 8-22 mins                    Julien Girt, PT Acute Rehabilitation Services Pager [phone]  Office [phone]    Legrand Lasser D Elonda Husky 09/13/2018, 2:24 PM

## 2018-09-13 NOTE — Progress Notes (Signed)
Physical Therapy Treatment Patient Details Name: Shelly Cohen MRN: [id number] DOB: June 03, 1961 Today's Date: 09/13/2018    History of Present Illness R THA-AA    PT Comments    Pt with improved ambulation distance and had decreased hip pain this session. Pt progressing well, will continue to reinforce safe ambulation and THA exercises tomorrow prior to d/c. PT to continue to follow.   Follow Up Recommendations  Follow surgeon's recommendation for DC plan and follow-up therapies;Supervision for mobility/OOB     Equipment Recommendations  None recommended by PT    Recommendations for Other Services       Precautions / Restrictions Precautions Precautions: Fall Restrictions Weight Bearing Restrictions: No Other Position/Activity Restrictions: WBAT    Mobility  Bed Mobility Overal bed mobility: Needs Assistance Bed Mobility: Supine to Sit       Sit to supine: Min assist;HOB elevated   General bed mobility comments: min assist for LLE lowering once at EOB. Pt with increased time to scoot to EOB, verbal cuing for propping on elbows to scoot forward.   Transfers Overall transfer level: Needs assistance Equipment used: Rolling walker (2 wheeled) Transfers: Sit to/from Stand Sit to Stand: Min guard         General transfer comment: Min guard for safety  Ambulation/Gait Ambulation/Gait assistance: Min guard(PT brought recliner in case of lightheadedness/nausea) Gait Distance (Feet): 150 Feet Assistive device: Rolling walker (2 wheeled) Gait Pattern/deviations: Antalgic;Trunk flexed;Step-through pattern Gait velocity: decr    General Gait Details: Min guard for safety. PT progressed pt to step-through gait without difficulty.    Stairs             Wheelchair Mobility    Modified Rankin (Stroke Patients Only)       Balance Overall balance assessment: Mild deficits observed, not formally tested                                           Cognition Arousal/Alertness: Awake/alert Behavior During Therapy: WFL for tasks assessed/performed Overall Cognitive Status: Within Functional Limits for tasks assessed                                        Exercises      General Comments        Pertinent Vitals/Pain Pain Assessment: Faces Faces Pain Scale: Hurts a little bit Pain Location: R hip, when getting out of bed  Pain Descriptors / Indicators: Sore Pain Intervention(s): Limited activity within patient's tolerance;Premedicated before session;Monitored during session;Repositioned    Home Living                      Prior Function            PT Goals (current goals can now be found in the care plan section) Acute Rehab PT Goals Patient Stated Goal: to walk for exercise PT Goal Formulation: With patient Time For Goal Achievement: 09/19/18 Potential to Achieve Goals: Good Progress towards PT goals: Progressing toward goals    Frequency    7X/week      PT Plan Current plan remains appropriate    Co-evaluation              AM-PAC PT "6 Clicks" Mobility   Outcome Measure  Help needed turning from your back  to your side while in a flat bed without using bedrails?: A Little Help needed moving from lying on your back to sitting on the side of a flat bed without using bedrails?: A Little Help needed moving to and from a bed to a chair (including a wheelchair)?: A Little Help needed standing up from a chair using your arms (e.g., wheelchair or bedside chair)?: A Little Help needed to walk in hospital room?: A Little Help needed climbing 3-5 steps with a railing? : A Lot 6 Click Score: 17    End of Session Equipment Utilized During Treatment: Gait belt Activity Tolerance: Patient tolerated treatment well Patient left: with bed alarm set;in chair;with chair alarm set;with call bell/phone within reach Nurse Communication: Mobility status PT Visit Diagnosis:  Pain;Difficulty in walking, not elsewhere classified (R26.2) Pain - Right/Left: Right Pain - part of body: Hip     Time: 1723-1740 PT Time Calculation (min) (ACUTE ONLY): 17 min  Charges:  $Gait Training: 8-22 mins                     Julien Girt, PT Acute Rehabilitation Services Pager [phone]  Office [phone]    Roxine Caddy D Elonda Husky 09/13/2018, 6:49 PM

## 2018-09-13 NOTE — Evaluation (Signed)
Occupational Therapy Evaluation Patient Details Name: Shelly Cohen MRN: [id number] DOB: [birth year] Today's Date: 09/13/2018    History of Present Illness R THA-AA   Clinical Impression   This 58 y o female was admitted for the above sx.  She was independent with adls prior to admission and currently needs min to mod A for LB adls and toilet transfers. She has AE from her husband and he is also available to assist her. Will follow in acute setting with min guard level goals                                              Follow Up Recommendations  Supervision/Assistance - 24 hour    Equipment Recommendations  None recommended by OT(has [facility])    Recommendations for Other Services       Precautions / Restrictions Precautions Precautions: Fall Restrictions Weight Bearing Restrictions: No Other Position/Activity Restrictions: WBAT      Mobility Bed Mobility         Supine to sit: Min assist     General bed mobility comments: cues for sequence and assist for RLE  Transfers   Equipment used: Rolling walker (2 wheeled)   Sit to Stand: Min assist         General transfer comment: cues for UE placement    Balance                                           ADL either performed or assessed with clinical judgement   ADL Overall ADL's : Needs assistance/impaired Eating/Feeding: Independent   Grooming: Set up   Upper Body Bathing: Set up   Lower Body Bathing: Minimal assistance;Sit to/from stand;With adaptive equipment   Upper Body Dressing : Set up   Lower Body Dressing: Moderate assistance;Sit to/from stand;With adaptive equipment   Toilet Transfer: Minimal assistance;Stand-pivot;BSC;RW   Toileting- Clothing Manipulation and Hygiene: Minimal assistance;Sit to/from stand         General ADL Comments: has AE at home; has been practicing with sock aide.  Used 3:1 in room then ambulated in room to sink     Vision          Perception     Praxis      Pertinent Vitals/Pain Pain Score: 5  Pain Location: R hip Pain Descriptors / Indicators: Sore Pain Intervention(s): Limited activity within patient's tolerance;Monitored during session;Premedicated before session;Repositioned     Hand Dominance     Extremity/Trunk Assessment Upper Extremity Assessment Upper Extremity Assessment: Overall WFL for tasks assessed           Communication Communication Communication: No difficulties   Cognition Arousal/Alertness: Awake/alert Behavior During Therapy: WFL for tasks assessed/performed Overall Cognitive Status: Within Functional Limits for tasks assessed                                     General Comments       Exercises     Shoulder Instructions      Home Living Family/patient expects to be discharged to:: Private residence Living Arrangements: Spouse/significant other Available Help at Discharge: Family;Available 24 hours/day  Bathroom Shower/Tub: Occupational psychologist: Standard     Home Equipment: Environmental consultant - 2 wheels;Bedside commode;Tub bench   Additional Comments: has tub with build in bench and walk in shower; can use 3:1 in it      Prior Functioning/Environment Level of Independence: Independent                 OT Problem List: Decreased activity tolerance;Pain;Decreased knowledge of use of DME or AE      OT Treatment/Interventions: Self-care/ADL training;Energy conservation;DME and/or AE instruction;Patient/family education;Balance training;Therapeutic activities    OT Goals(Current goals can be found in the care plan section) Acute Rehab OT Goals Patient Stated Goal: to walk for exercise OT Goal Formulation: With patient Time For Goal Achievement: 09/20/18 Potential to Achieve Goals: Good ADL Goals Pt Will Transfer to Toilet: with min guard assist;bedside commode;ambulating Pt Will Perform Tub/Shower Transfer: Shower  transfer;ambulating;3 in 1;with min guard assist Additional ADL Goal #1: pt will complete adl with min guard using ae  OT Frequency: Min 2X/week   Barriers to D/C:            Co-evaluation              AM-PAC OT "6 Clicks" Daily Activity     Outcome Measure Help from another person eating meals?: None Help from another person taking care of personal grooming?: A Little Help from another person toileting, which includes using toliet, bedpan, or urinal?: A Little Help from another person bathing (including washing, rinsing, drying)?: A Little Help from another person to put on and taking off regular upper body clothing?: A Little Help from another person to put on and taking off regular lower body clothing?: A Lot 6 Click Score: 18   End of Session    Activity Tolerance: Patient tolerated treatment well Patient left: in chair;with call bell/phone within reach;with chair alarm set  OT Visit Diagnosis: Pain Pain - Right/Left: Right Pain - part of body: Hip                Time: 6269-4854 OT Time Calculation (min): 30 min Charges:  OT General Charges $OT Visit: 1 Visit OT Evaluation $OT Eval Low Complexity: 1 Low OT Treatments $Self Care/Home Management : 8-22 mins  Lesle Chris, OTR/L Acute Rehabilitation Services [phone] [facility] pager [phone] office 09/13/2018  [place] 09/13/2018, 12:36 PM

## 2018-09-13 NOTE — Progress Notes (Signed)
Subjective: 1 Day Post-Op Procedure(s) (LRB): RIGHT TOTAL HIP ARTHROPLASTY ANTERIOR APPROACH (Right) Patient reports pain as moderate.    Objective: Vital signs in last 24 hours: Temp:  [97.4 F (36.3 C)-98.2 F (36.8 C)] 98.1 F (36.7 C) (03/14 0934) Pulse Rate:  [65-98] 78 (03/14 0934) Resp:  [10-18] 16 (03/14 0934) BP: (111-162)/(40-85) 144/68 (03/14 0934) SpO2:  [92 %-100 %] 97 % (03/14 0934) Weight:  [92.1 kg] 92.1 kg (03/13 1332)  Intake/Output from previous day: 03/13 0701 - 03/14 0700 In: 2477.4 [P.O.:240; I.V.:1937.4; IV Piggyback:300] Out: 2175 [Urine:2025; Blood:150] Intake/Output this shift: Total I/O In: 279.3 [I.V.:279.3] Out: 300 [Urine:300]  Recent Labs    09/13/18 0452  HGB 10.8*   Recent Labs    09/13/18 0452  WBC 16.7*  RBC 4.42  HCT 37.3  PLT 305   Recent Labs    09/13/18 0452  NA 138  K 4.0  CL 104  CO2 24  BUN 9  CREATININE 0.80  GLUCOSE 153*  CALCIUM 9.0   No results for input(s): LABPT, INR in the last 72 hours.  Sensation intact distally Intact pulses distally Dorsiflexion/Plantar flexion intact Incision: dressing C/D/I   Assessment/Plan: 1 Day Post-Op Procedure(s) (LRB): RIGHT TOTAL HIP ARTHROPLASTY ANTERIOR APPROACH (Right) Up with therapy Plan for discharge tomorrow Discharge home with home health      Mcarthur Rossetti 09/13/2018, 11:52 AM

## 2018-09-13 NOTE — Discharge Instructions (Signed)

## 2018-09-14 ENCOUNTER — Inpatient Hospital Stay (HOSPITAL_COMMUNITY): Payer: 59

## 2018-09-14 MED ORDER — GADOBUTROL 1 MMOL/ML IV SOLN
9.0000 mL | Freq: Once | INTRAVENOUS | Status: AC | PRN
  Administered 2018-09-14: 9 mL via INTRAVENOUS

## 2018-09-14 NOTE — Progress Notes (Signed)
At approximately 69, PT notified RN pt reported weakness and decreased sensation to LUE.  Dr Lorin Mercy immediately notified in person and presented to pt's bedside.  No vision changes or changes to LE movement or sensation.  VSS.  MRI ordered.  Pt to MRI and returned to room.  Per Dr. Lorin Mercy, MRI results negative.  Pt ready for discharge.  Pt acknowledges and agreeable to discharge.

## 2018-09-14 NOTE — Progress Notes (Addendum)
Physical Therapy Treatment Patient Details Name: Shelly Cohen MRN: [id number] DOB: 1961/05/17 Today's Date: 09/14/2018    History of Present Illness R THA-AA    PT Comments    Pt is progressing well with mobility, she ambulated 62' with RW, no loss of balance. Reviewed THA HEP, pt demonstrates good understanding. Pt reports she's having trouble holding items with her L hand, and that her hand feels a little bit numb. Pt reports onset was last night. Pt has decreased sensation to light touch L hand, L shoulder flexion/elbow flexion/elbow extension all +3/5. RUE strength is grossly +4/5. Pt denies visual changes, denies LLE weakness/sensation changes. No facial droop noted. Thumb to fingertip test L hand is labored.  RN and Dr Lorin Mercy notified.    Follow Up Recommendations  Follow surgeon's recommendation for DC plan and follow-up therapies;Supervision for mobility/OOB     Equipment Recommendations  None recommended by PT    Recommendations for Other Services       Precautions / Restrictions Precautions Precautions: Fall Restrictions Weight Bearing Restrictions: No Other Position/Activity Restrictions: WBAT    Mobility  Bed Mobility Overal bed mobility: Needs Assistance Bed Mobility: Sit to Supine       Sit to supine: Min assist;HOB elevated   General bed mobility comments: min assist for LLE.   Transfers Overall transfer level: Needs assistance Equipment used: Rolling walker (2 wheeled) Transfers: Sit to/from Stand Sit to Stand: Min guard         General transfer comment: Min guard for safety, VCs to scoot forward and for hand placement  Ambulation/Gait Ambulation/Gait assistance: Min guard(PT brought recliner in case of lightheadedness/nausea) Gait Distance (Feet): 165 Feet Assistive device: Rolling walker (2 wheeled) Gait Pattern/deviations: Antalgic;Trunk flexed;Step-through pattern Gait velocity: decr    General Gait Details: Min guard for safety.  PT progressed pt to step-through gait without difficulty.    Stairs             Wheelchair Mobility    Modified Rankin (Stroke Patients Only)       Balance Overall balance assessment: Mild deficits observed, not formally tested                                          Cognition Arousal/Alertness: Awake/alert Behavior During Therapy: WFL for tasks assessed/performed Overall Cognitive Status: Within Functional Limits for tasks assessed                                        Exercises Total Joint Exercises Ankle Circles/Pumps: Both;10 reps;Supine Quad Sets: AROM;Right;10 reps;Supine Short Arc Quad: AROM;Right;10 reps;Supine Heel Slides: AAROM;Right;10 reps;Supine Hip ABduction/ADduction: AAROM;Right;10 reps;Supine;Standing(x 10 in supine and x 10 in standing) Long Arc Quad: AROM;Right;10 reps;Seated Knee Flexion: AROM;Right;10 reps;Standing    General Comments        Pertinent Vitals/Pain Pain Score: 5  Pain Location: R hip at rest and with walking Pain Descriptors / Indicators: Sore Pain Intervention(s): Limited activity within patient's tolerance;Monitored during session;RN gave pain meds during session;Ice applied    Home Living                      Prior Function            PT Goals (current goals can now be found in the care plan  section) Acute Rehab PT Goals Patient Stated Goal: to walk for exercise PT Goal Formulation: With patient Time For Goal Achievement: 09/19/18 Potential to Achieve Goals: Good    Frequency    7X/week      PT Plan Current plan remains appropriate    Co-evaluation              AM-PAC PT "6 Clicks" Mobility   Outcome Measure  Help needed turning from your back to your side while in a flat bed without using bedrails?: None Help needed moving from lying on your back to sitting on the side of a flat bed without using bedrails?: A Little Help needed moving to and from a  bed to a chair (including a wheelchair)?: A Little Help needed standing up from a chair using your arms (e.g., wheelchair or bedside chair)?: A Little Help needed to walk in hospital room?: A Little Help needed climbing 3-5 steps with a railing? : A Little 6 Click Score: 19    End of Session Equipment Utilized During Treatment: Gait belt Activity Tolerance: Patient tolerated treatment well Patient left: with call bell/phone within reach;in bed Nurse Communication: Mobility status PT Visit Diagnosis: Pain;Difficulty in walking, not elsewhere classified (R26.2) Pain - Right/Left: Right Pain - part of body: Hip     Time: 4268-3419 PT Time Calculation (min) (ACUTE ONLY): 41 min  Charges:  $Gait Training: 8-22 mins $Therapeutic Exercise: 8-22 mins $Therapeutic Activity: 8-22 mins                    Blondell Reveal Kistler PT 09/14/2018  Acute Rehabilitation Services Pager [phone] Office [phone]

## 2018-09-14 NOTE — Plan of Care (Signed)
  Problem: Pain Managment: Goal: General experience of comfort will improve Outcome: Progressing   Problem: Safety: Goal: Ability to remain free from injury will improve Outcome: Progressing   Problem: Coping: Goal: Level of anxiety will decrease Outcome: Progressing   Problem: Clinical Measurements: Goal: Cardiovascular complication will be avoided Outcome: Progressing   Problem: Clinical Measurements: Goal: Respiratory complications will improve Outcome: Progressing

## 2018-09-14 NOTE — Progress Notes (Signed)
Acute left hand coordination problems and weakness reported to physical therapist after ambulating in the halls with walker.  She was planned to be discharged today.  Talk with neurology they recommended MRI brain rule out TIA/stroke.  Patient has no facial abnormality normal smile.  Upper extremity reflexes are 2+ no brachial plexus tenderness negative Spurling on the neck. My cell [phone]

## 2018-09-14 NOTE — Progress Notes (Signed)
MRI brain neg.  Can follow up with Dr. Ninfa Linden in one week. Ready for discharge .

## 2018-09-15 ENCOUNTER — Telehealth (INDEPENDENT_AMBULATORY_CARE_PROVIDER_SITE_OTHER): Payer: Self-pay | Admitting: Orthopaedic Surgery

## 2018-09-15 DIAGNOSIS — N6320 Unspecified lump in the left breast, unspecified quadrant: Secondary | ICD-10-CM | POA: Diagnosis not present

## 2018-09-15 DIAGNOSIS — E669 Obesity, unspecified: Secondary | ICD-10-CM | POA: Diagnosis not present

## 2018-09-15 DIAGNOSIS — E039 Hypothyroidism, unspecified: Secondary | ICD-10-CM | POA: Diagnosis not present

## 2018-09-15 DIAGNOSIS — I10 Essential (primary) hypertension: Secondary | ICD-10-CM | POA: Diagnosis not present

## 2018-09-15 DIAGNOSIS — Z471 Aftercare following joint replacement surgery: Secondary | ICD-10-CM | POA: Diagnosis not present

## 2018-09-15 DIAGNOSIS — D563 Thalassemia minor: Secondary | ICD-10-CM | POA: Diagnosis not present

## 2018-09-15 DIAGNOSIS — D509 Iron deficiency anemia, unspecified: Secondary | ICD-10-CM | POA: Diagnosis not present

## 2018-09-15 DIAGNOSIS — Z96641 Presence of right artificial hip joint: Secondary | ICD-10-CM | POA: Diagnosis not present

## 2018-09-15 DIAGNOSIS — H401190 Primary open-angle glaucoma, unspecified eye, stage unspecified: Secondary | ICD-10-CM | POA: Diagnosis not present

## 2018-09-15 MED FILL — oxyCODONE HCL 5 MG TABS: 5 | 4 days supply | Qty: 35 | Fill #0

## 2018-09-15 MED FILL — METHOCARBAMOL 500 MG TABLET: 500 | 8 days supply | Qty: 35 | Fill #0

## 2018-09-15 MED FILL — ASPIRIN LOW DOSE 81 MG CHEW: 81 | 15 days supply | Qty: 30 | Fill #0

## 2018-09-15 NOTE — Telephone Encounter (Signed)
Received voicemail message from Rosalie Doctor with [facility] needing verbal orders for HHPT 3 Wk 2. Also, Randall Hiss said patient has a positive PPD and patient was not aware of this. Randall Hiss said patient have 2 medication interactions Aspirin and Blood thinner.The number to contact Randall Hiss is [phone]

## 2018-09-15 NOTE — Telephone Encounter (Signed)
Verbal order left on VM, also letting him know we were unaware she was taking 2 blood thinners, we only have her taking baby aspirin

## 2018-09-15 NOTE — Discharge Summary (Signed)
Patient ID: Shelly Cohen MRN: [id number] DOB/AGE: 1961/01/03 58 y.o.  Admit date: 09/12/2018 Discharge date: 09/15/2018  Admission Diagnoses:  Principal Problem:   Unilateral primary osteoarthritis, right hip Active Problems:   Status post total replacement of right hip   Discharge Diagnoses:  Same  Past Medical History:  Diagnosis Date  . Alpha thalassemia trait   . Fibroadenoma   . Glaucoma    monitored every 53months, right eye worse than left   . History of positive PPD, treatment status unknown   . Hypertension   . Thyroid disease     Surgeries: Procedure(s): RIGHT TOTAL HIP ARTHROPLASTY ANTERIOR APPROACH on 09/12/2018   Consultants:   Discharged Condition: Improved  Hospital Course: Shelly Cohen is an 58 y.o. female who was admitted 09/12/2018 for operative treatment ofUnilateral primary osteoarthritis, right hip. Patient has severe unremitting pain that affects sleep, daily activities, and work/hobbies. After pre-op clearance the patient was taken to the operating room on 09/12/2018 and underwent  Procedure(s): RIGHT TOTAL HIP ARTHROPLASTY ANTERIOR APPROACH.    Patient was given perioperative antibiotics:  Anti-infectives (From admission, onward)   Start     Dose/Rate Route Frequency Ordered Stop   09/12/18 2000  vancomycin (VANCOCIN) 1,000 mg in sodium chloride 0.9 % 250 mL IVPB     1,000 mg 250 mL/hr over 60 Minutes Intravenous Every 12 hours 09/12/18 1328 09/12/18 2148   09/12/18 0730  vancomycin (VANCOCIN) IVPB 1000 mg/200 mL premix     1,000 mg 200 mL/hr over 60 Minutes Intravenous On call to O.R. 09/12/18 5852 09/12/18 0934       Patient was given sequential compression devices, early ambulation, and chemoprophylaxis to prevent DVT.Acute left hand coordination problems and weakness reported to physical therapist after ambulating in the halls with walker.  Patient has no facial abnormality normal smile.  Upper extremity reflexes are 2+ no brachial  plexus tenderness negative MRI of brain negative.   Patient benefited maximally from hospital stay.  Recent vital signs: No data found.   Recent laboratory studies:  Recent Labs    09/13/18 0452  WBC 16.7*  HGB 10.8*  HCT 37.3  PLT 305  NA 138  K 4.0  CL 104  CO2 24  BUN 9  CREATININE 0.80  GLUCOSE 153*  CALCIUM 9.0     Discharge Medications:   Allergies as of 09/14/2018      Reactions   Diphenhydramine Hcl Other (See Comments)   Itching  And hallucination    Hydrocodone Nausea Only   Azithromycin Itching, Rash   Cephalexin Rash   Penicillin G Rash   Did it involve swelling of the face/tongue/throat, SOB, or low BP? No Did it involve sudden or severe rash/hives, skin peeling, or any reaction on the inside of your mouth or nose? No Did you need to seek medical attention at a hospital or doctor's office? No When did it last happen?childhood If all above answers are "NO", may proceed with cephalosporin use.   Zolpidem Itching, Rash      Medication List    STOP taking these medications   aspirin EC 81 MG tablet Replaced by:  aspirin 81 MG chewable tablet     TAKE these medications   albuterol 108 (90 Base) MCG/ACT inhaler Commonly known as:  PROVENTIL HFA;VENTOLIN HFA Inhale 1-2 puffs into the lungs every 6 (six) hours as needed. What changed:  reasons to take this   amLODipine 10 MG tablet Commonly known as:  NORVASC Take 1 tablet (10 mg  total) by mouth daily.   aspirin 81 MG chewable tablet Chew 1 tablet (81 mg total) by mouth 2 (two) times daily. Replaces:  aspirin EC 81 MG tablet   carvedilol 25 MG tablet Commonly known as:  COREG Take 1 tablet (25 mg total) by mouth 2 (two) times daily.   docusate sodium 100 MG capsule Commonly known as:  COLACE Take 1 capsule (100 mg total) by mouth 2 (two) times daily. What changed:    when to take this  reasons to take this   FISH OIL PO Take 1 capsule by mouth daily.   levothyroxine 50 MCG  tablet Commonly known as:  SYNTHROID, LEVOTHROID Take 1 tablet (50 mcg total) by mouth daily before breakfast.   lisinopril 40 MG tablet Commonly known as:  PRINIVIL,ZESTRIL Take 1 tablet (40 mg total) by mouth daily.   methocarbamol 500 MG tablet Commonly known as:  ROBAXIN Take 1 tablet (500 mg total) by mouth every 6 (six) hours as needed for muscle spasms.   MiraLax powder Generic drug:  polyethylene glycol powder Take 17 g by mouth daily.   Multi-Vitamins Tabs Take 1 tablet by mouth daily.   oxyCODONE 5 MG immediate release tablet Commonly known as:  Oxy IR/ROXICODONE Take 1-2 tablets (5-10 mg total) by mouth every 6 (six) hours as needed for moderate pain (pain score 4-6).   spironolactone 25 MG tablet Commonly known as:  ALDACTONE Take 1 tablet (25 mg total) by mouth daily.   VITAMIN C PO Take 1 tablet by mouth daily.   Vyzulta 0.024 % Soln Generic drug:  Latanoprostene Bunod Apply 1 drop to eye 2 (two) times daily.       Diagnostic Studies: Mr Jeri Cos FB Contrast  Result Date: 09/14/2018 CLINICAL DATA:  Acute left hand coordination disturbance and weakness. EXAM: MRI HEAD WITHOUT AND WITH CONTRAST TECHNIQUE: Multiplanar, multiecho pulse sequences of the brain and surrounding structures were obtained without and with intravenous contrast. CONTRAST:  9 cc Gadavist COMPARISON:  None. FINDINGS: Brain: Diffusion imaging does not show any acute or subacute infarction. The brainstem and cerebellum are normal. Cerebral hemispheres are normal with exception of a few punctate foci of T2 and FLAIR signal in the white matter, not likely significant. The brain does not show a pattern suggesting clinically significant small-vessel disease. No mass lesion, hemorrhage, hydrocephalus or extra-axial collection. After contrast administration, no abnormal enhancement occurs. Vascular: Major vessels at the base of the brain show flow. Skull and upper cervical spine: Negative Sinuses/Orbits:  Clear/normal Other: None IMPRESSION: No acute or significant finding. Normal study with exception of a few punctate foci of T2 and FLAIR signal in the hemispheric white matter, not likely significant. No explanation for the clinical presentation is identified. Electronically Signed   By: Nelson Chimes M.D.   On: 09/14/2018 11:16   Dg Pelvis Portable  Result Date: 09/12/2018 CLINICAL DATA:  Status post op right side anterior approach total hip replacement EXAM: PORTABLE PELVIS 1-2 VIEWS COMPARISON:  07/03/2018. FINDINGS: Status post right total hip arthroplasty device. The hardware components are in anatomic alignment. No periprosthetic fracture or dislocation. Gas noted in surrounding soft tissues. IMPRESSION: No complications status right total hip arthroplasty. Electronically Signed   By: Kerby Moors M.D.   On: 09/12/2018 12:25   Dg C-arm 1-60 Min-no Report  Result Date: 09/12/2018 Fluoroscopy was utilized by the requesting physician.  No radiographic interpretation.   Dg Hip Operative Unilat W Or W/o Pelvis Right  Result Date: 09/12/2018  CLINICAL DATA:  RIGHT total hip arthroplasty EXAM: OPERATIVE RIGHT HIP (WITH PELVIS IF PERFORMED) 2 VIEWS TECHNIQUE: Fluoroscopic spot image(s) were submitted for interpretation post-operatively. COMPARISON:  04/22/2018 FINDINGS: RIGHT total hip arthroplasty identified without acute fracture or dislocation. No definite complicating features noted. IMPRESSION: RIGHT total hip arthroplasty without definite complicating features. Electronically Signed   By: Margarette Canada M.D.   On: 09/12/2018 11:41    Disposition:     Follow-up Information    Mcarthur Rossetti, MD Follow up in 2 week(s).   Specialty:  Orthopedic Surgery Contact information: [place] [address] [phone]        [facility].   Specialty:  [place] Why:  [facility] will provide home health physical therapy Contact  information: [place] [place] [address] [phone]            Signed: Erskine Emery 09/15/2018, 11:43 AM

## 2018-09-16 ENCOUNTER — Institutional Professional Consult (permissible substitution): Payer: 59 | Admitting: Neurology

## 2018-09-16 ENCOUNTER — Encounter (HOSPITAL_COMMUNITY): Payer: Self-pay | Admitting: Orthopaedic Surgery

## 2018-09-16 ENCOUNTER — Other Ambulatory Visit: Payer: Self-pay | Admitting: *Deleted

## 2018-09-16 NOTE — Patient Outreach (Signed)
[facility]) Care Management  09/16/2018  Shelly Cohen 03-21-1961 [id number]   Transition of care call   Referral received: 09/16/2018 Initial outreach: 09/16/2018 Insurance: [place]    Subjective: Initial successful telephone call to patient's preferred number in order to complete transition of care assessment; 2 HIPAA identifiers verified. Explained purpose of call and completed transition of care assessment.  States she is doing well, denies post op problems, states surgical pain well managed with prescribed medications, tolerating  diet , denies bowel or bladder problems.  Family are assisting  with her recovery.      Objective:  Ms. Shelly Cohen was hospitalized at [facility] from 09/12/2018 to 09/14/2018 for Comorbidities include: OA (osteoarthritis) of hip, Unilateral primary osteoarthritis, right hip and HTN. She was discharged to home on 09/14/2018 with the need for home health services or DME.   Assessment:  Patient voices good understanding of all discharge instructions.  See transition of care flowsheet for assessment details.   Plan:  Reviewed [place] [place] Active Health Management 2020 Wellness Requirements of: Completing the computerized Health Assessment and the Health Action Step with Active Health Management [facility]) by March 03 2019 AND have an annual physical between July 02, 2017 and December 31, 2018.  No ongoing care management needs identified so will close case to [place] [facility] care management services and route successful outreach letter with [place] Management pamphlet and 24 Hour Nurse Line Magnet to [place] Management clinical pool to be mailed to patient's home address.    Patient was recently discharged from hospital and all medications have been reviewed.  Raina Mina, RN Care Management Coordinator [place] Office [phone]

## 2018-09-17 ENCOUNTER — Telehealth (INDEPENDENT_AMBULATORY_CARE_PROVIDER_SITE_OTHER): Payer: Self-pay | Admitting: Orthopaedic Surgery

## 2018-09-17 ENCOUNTER — Other Ambulatory Visit (INDEPENDENT_AMBULATORY_CARE_PROVIDER_SITE_OTHER): Payer: Self-pay | Admitting: Physician Assistant

## 2018-09-17 DIAGNOSIS — D509 Iron deficiency anemia, unspecified: Secondary | ICD-10-CM | POA: Diagnosis not present

## 2018-09-17 DIAGNOSIS — Z96641 Presence of right artificial hip joint: Secondary | ICD-10-CM | POA: Diagnosis not present

## 2018-09-17 DIAGNOSIS — E669 Obesity, unspecified: Secondary | ICD-10-CM | POA: Diagnosis not present

## 2018-09-17 DIAGNOSIS — E039 Hypothyroidism, unspecified: Secondary | ICD-10-CM | POA: Diagnosis not present

## 2018-09-17 DIAGNOSIS — Z471 Aftercare following joint replacement surgery: Secondary | ICD-10-CM | POA: Diagnosis not present

## 2018-09-17 DIAGNOSIS — D563 Thalassemia minor: Secondary | ICD-10-CM | POA: Diagnosis not present

## 2018-09-17 DIAGNOSIS — H401190 Primary open-angle glaucoma, unspecified eye, stage unspecified: Secondary | ICD-10-CM | POA: Diagnosis not present

## 2018-09-17 DIAGNOSIS — I10 Essential (primary) hypertension: Secondary | ICD-10-CM | POA: Diagnosis not present

## 2018-09-17 DIAGNOSIS — N6320 Unspecified lump in the left breast, unspecified quadrant: Secondary | ICD-10-CM | POA: Diagnosis not present

## 2018-09-17 MED ORDER — ACETAMINOPHEN-CODEINE #3 300-30 MG PO TABS: 1.0000 | ORAL_TABLET | ORAL | 0 refills | Status: DC | PRN

## 2018-09-17 MED FILL — ACETAMINOPHEN/COD #3 TABLET: 300-30 | 3 days supply | Qty: 30 | Fill #0

## 2018-09-17 NOTE — Telephone Encounter (Signed)
Shelly Cohen

## 2018-09-17 NOTE — Telephone Encounter (Signed)
Patient said she uses [facility] patient pharmacy.

## 2018-09-17 NOTE — Telephone Encounter (Signed)
Call her in something different?

## 2018-09-17 NOTE — Telephone Encounter (Signed)
Patient called advised the Oxycodone and Methocarbamol is making her sick and she is throwing up. Patient said she is still taking the medication. Patient said she just threw up a few minutes ago. The number to contact patient is [phone]

## 2018-09-17 NOTE — Telephone Encounter (Signed)
Sent tylenol #3

## 2018-09-18 ENCOUNTER — Telehealth: Payer: Self-pay

## 2018-09-18 NOTE — Telephone Encounter (Signed)
Copied from [place] [id number]. Topic: General - Other >> Sep 17, 2018  3:59 PM Valla Leaver wrote: Reason for CRM: Randall Hiss, PT with [facility] calling to get additional information about patient being positive result on ppd test?

## 2018-09-18 NOTE — Telephone Encounter (Signed)
Randall Hiss going to fax over the paper he has to show [place] of this documentation, waiting for paper work from him.

## 2018-09-18 NOTE — Telephone Encounter (Signed)
Left vm for Randall Hiss to call back.

## 2018-09-19 DIAGNOSIS — E669 Obesity, unspecified: Secondary | ICD-10-CM | POA: Diagnosis not present

## 2018-09-19 DIAGNOSIS — D563 Thalassemia minor: Secondary | ICD-10-CM | POA: Diagnosis not present

## 2018-09-19 DIAGNOSIS — H401190 Primary open-angle glaucoma, unspecified eye, stage unspecified: Secondary | ICD-10-CM | POA: Diagnosis not present

## 2018-09-19 DIAGNOSIS — Z471 Aftercare following joint replacement surgery: Secondary | ICD-10-CM | POA: Diagnosis not present

## 2018-09-19 DIAGNOSIS — I10 Essential (primary) hypertension: Secondary | ICD-10-CM | POA: Diagnosis not present

## 2018-09-19 DIAGNOSIS — E039 Hypothyroidism, unspecified: Secondary | ICD-10-CM | POA: Diagnosis not present

## 2018-09-19 DIAGNOSIS — Z96641 Presence of right artificial hip joint: Secondary | ICD-10-CM | POA: Diagnosis not present

## 2018-09-19 DIAGNOSIS — D509 Iron deficiency anemia, unspecified: Secondary | ICD-10-CM | POA: Diagnosis not present

## 2018-09-19 DIAGNOSIS — N6320 Unspecified lump in the left breast, unspecified quadrant: Secondary | ICD-10-CM | POA: Diagnosis not present

## 2018-09-22 DIAGNOSIS — H401190 Primary open-angle glaucoma, unspecified eye, stage unspecified: Secondary | ICD-10-CM | POA: Diagnosis not present

## 2018-09-22 DIAGNOSIS — Z96641 Presence of right artificial hip joint: Secondary | ICD-10-CM | POA: Diagnosis not present

## 2018-09-22 DIAGNOSIS — Z471 Aftercare following joint replacement surgery: Secondary | ICD-10-CM | POA: Diagnosis not present

## 2018-09-22 DIAGNOSIS — D563 Thalassemia minor: Secondary | ICD-10-CM | POA: Diagnosis not present

## 2018-09-22 DIAGNOSIS — E039 Hypothyroidism, unspecified: Secondary | ICD-10-CM | POA: Diagnosis not present

## 2018-09-22 DIAGNOSIS — D509 Iron deficiency anemia, unspecified: Secondary | ICD-10-CM | POA: Diagnosis not present

## 2018-09-22 DIAGNOSIS — N6320 Unspecified lump in the left breast, unspecified quadrant: Secondary | ICD-10-CM | POA: Diagnosis not present

## 2018-09-22 DIAGNOSIS — I10 Essential (primary) hypertension: Secondary | ICD-10-CM | POA: Diagnosis not present

## 2018-09-22 DIAGNOSIS — E669 Obesity, unspecified: Secondary | ICD-10-CM | POA: Diagnosis not present

## 2018-09-22 NOTE — Telephone Encounter (Signed)
Left another vm for the pt to call back.  

## 2018-09-24 ENCOUNTER — Telehealth: Payer: Self-pay

## 2018-09-24 ENCOUNTER — Telehealth (INDEPENDENT_AMBULATORY_CARE_PROVIDER_SITE_OTHER): Payer: Self-pay | Admitting: Radiology

## 2018-09-24 DIAGNOSIS — N6320 Unspecified lump in the left breast, unspecified quadrant: Secondary | ICD-10-CM | POA: Diagnosis not present

## 2018-09-24 DIAGNOSIS — E039 Hypothyroidism, unspecified: Secondary | ICD-10-CM | POA: Diagnosis not present

## 2018-09-24 DIAGNOSIS — H401190 Primary open-angle glaucoma, unspecified eye, stage unspecified: Secondary | ICD-10-CM | POA: Diagnosis not present

## 2018-09-24 DIAGNOSIS — I10 Essential (primary) hypertension: Secondary | ICD-10-CM | POA: Diagnosis not present

## 2018-09-24 DIAGNOSIS — D509 Iron deficiency anemia, unspecified: Secondary | ICD-10-CM | POA: Diagnosis not present

## 2018-09-24 DIAGNOSIS — Z471 Aftercare following joint replacement surgery: Secondary | ICD-10-CM | POA: Diagnosis not present

## 2018-09-24 DIAGNOSIS — D563 Thalassemia minor: Secondary | ICD-10-CM | POA: Diagnosis not present

## 2018-09-24 DIAGNOSIS — E669 Obesity, unspecified: Secondary | ICD-10-CM | POA: Diagnosis not present

## 2018-09-24 DIAGNOSIS — Z96641 Presence of right artificial hip joint: Secondary | ICD-10-CM | POA: Diagnosis not present

## 2018-09-24 NOTE — Telephone Encounter (Signed)

## 2018-09-24 NOTE — Telephone Encounter (Signed)
Spoke with Randall Hiss and advised him that this might be a mistake when we first establish pt back in 2017 (enter in incorrectly) because pt denied report of this or have any symptoms.   Tanya please help, not sure if this can be corrected in the in Epic in the past medical history: History of positive PPD, treatment status unknown.

## 2018-09-24 NOTE — Telephone Encounter (Signed)
-----   Message from Consuelo Pandy, [place] sent at 09/24/2018 10:33 AM EDT ----- Regarding: Virtual Visit Chart reviewed. Pt's f/u is just for hypertension. To limit exposure given COVID19 I recommend she not come in to office for f/u. Please check with pt to see if she can do a virtual visit with me.

## 2018-09-24 NOTE — Telephone Encounter (Signed)
  Do you have now or have you had in the past 7 days a fever and/or chills? NO Do you have now or have you had in the past 7 days a cough? NO  Do you have now or have you had in the last 7 days nausea, vomiting or abdominal pain? NO  Have you been exposed to anyone who has tested positive for COVID-19? NO NO TRAVEL

## 2018-09-25 ENCOUNTER — Encounter (INDEPENDENT_AMBULATORY_CARE_PROVIDER_SITE_OTHER): Payer: Self-pay | Admitting: Orthopaedic Surgery

## 2018-09-25 ENCOUNTER — Other Ambulatory Visit: Payer: Self-pay

## 2018-09-25 ENCOUNTER — Ambulatory Visit (INDEPENDENT_AMBULATORY_CARE_PROVIDER_SITE_OTHER): Payer: 59 | Admitting: Orthopaedic Surgery

## 2018-09-25 DIAGNOSIS — Z96641 Presence of right artificial hip joint: Secondary | ICD-10-CM

## 2018-09-25 NOTE — Progress Notes (Signed)
The patient is 2 weeks tomorrow status post a right total hip arthroplasty.  She is only 58 years old.  She is ambulating with a walker but has been trying a cane at home.  She feels like she is making progress.  She still having increasing amount of pain but not taking really anything for pain.  She has been on an 81 mg aspirin twice daily.  She was on a baby aspirin once daily prior to this.  She understands she can go back to just once daily.  On examination her right hip incision looks good.  I removed the staples and place Steri-Strips.  Her leg lengths appear equal.  I gave her reassurance that she should do well with time.  She will stay out of work these next 4 weeks until we see her back in 4 weeks from now for repeat exam.  No x-rays are needed.  She said she is doing fine on her medications and will call if she needs a refill.

## 2018-09-26 DIAGNOSIS — E669 Obesity, unspecified: Secondary | ICD-10-CM | POA: Diagnosis not present

## 2018-09-26 DIAGNOSIS — E039 Hypothyroidism, unspecified: Secondary | ICD-10-CM | POA: Diagnosis not present

## 2018-09-26 DIAGNOSIS — Z96641 Presence of right artificial hip joint: Secondary | ICD-10-CM | POA: Diagnosis not present

## 2018-09-26 DIAGNOSIS — D563 Thalassemia minor: Secondary | ICD-10-CM | POA: Diagnosis not present

## 2018-09-26 DIAGNOSIS — D509 Iron deficiency anemia, unspecified: Secondary | ICD-10-CM | POA: Diagnosis not present

## 2018-09-26 DIAGNOSIS — H401190 Primary open-angle glaucoma, unspecified eye, stage unspecified: Secondary | ICD-10-CM | POA: Diagnosis not present

## 2018-09-26 DIAGNOSIS — I10 Essential (primary) hypertension: Secondary | ICD-10-CM | POA: Diagnosis not present

## 2018-09-26 DIAGNOSIS — N6320 Unspecified lump in the left breast, unspecified quadrant: Secondary | ICD-10-CM | POA: Diagnosis not present

## 2018-09-26 DIAGNOSIS — Z471 Aftercare following joint replacement surgery: Secondary | ICD-10-CM | POA: Diagnosis not present

## 2018-09-29 ENCOUNTER — Telehealth: Payer: Self-pay

## 2018-09-29 ENCOUNTER — Telehealth (INDEPENDENT_AMBULATORY_CARE_PROVIDER_SITE_OTHER): Payer: 59 | Admitting: Cardiology

## 2018-09-29 VITALS — BP 130/83 | HR 74 | Ht 63.0 in | Wt 201.5 lb

## 2018-09-29 DIAGNOSIS — I10 Essential (primary) hypertension: Secondary | ICD-10-CM | POA: Diagnosis not present

## 2018-09-29 MED ORDER — ASPIRIN EC 81 MG PO TBEC: 81.0000 mg | DELAYED_RELEASE_TABLET | Freq: Every day | ORAL | 3 refills | Status: DC

## 2018-09-29 MED ORDER — SPIRONOLACTONE 50 MG PO TABS: 50.0000 mg | ORAL_TABLET | Freq: Every day | ORAL | 3 refills | Status: DC

## 2018-09-29 NOTE — Anesthesia Postprocedure Evaluation (Signed)
Anesthesia Post Note  Patient: Shelly Cohen  Procedure(s) Performed: RIGHT TOTAL HIP ARTHROPLASTY ANTERIOR APPROACH (Right Hip)     Patient location during evaluation: PACU Anesthesia Type: Spinal Level of consciousness: oriented and awake and alert Pain management: pain level controlled Vital Signs Assessment: post-procedure vital signs reviewed and stable Respiratory status: spontaneous breathing, respiratory function stable and patient connected to nasal cannula oxygen Cardiovascular status: blood pressure returned to baseline and stable Postop Assessment: no headache, no backache and no apparent nausea or vomiting Anesthetic complications: no    Last Vitals:  Vitals:   09/14/18 0654 09/14/18 0905  BP: 138/65 136/71  Pulse: 80 79  Resp: 16 18  Temp: 37.3 C 36.9 C  SpO2: 95% 94%    Last Pain:  Vitals:   09/14/18 0911  TempSrc:   PainSc: 3                  Olin Gurski S

## 2018-09-29 NOTE — Telephone Encounter (Signed)
I spoke to the patient this morning and reviewed the pre-arrangements for her video televisit this afternoon @ 3:00.

## 2018-09-29 NOTE — Telephone Encounter (Signed)
lpmtcb 3/30 

## 2018-09-29 NOTE — Progress Notes (Signed)
Virtual Visit via Video Note    Evaluation Performed:  Follow-up visit  This visit type was conducted due to national recommendations for restrictions regarding the COVID-19 Pandemic (e.g. social distancing).  This format is felt to be most appropriate for this patient at this time.  All issues noted in this document were discussed and addressed.  No physical exam was performed (except for noted visual exam findings with Video Visits).  Please refer to the patient's chart (MyChart message for video visits and phone note for telephone visits) for the patient's consent to telehealth for [facility].  Date:  09/29/2018   ID:  Shelly Cohen, DOB 05-Sep-1960, MRN [id number]  Patient Location:  Home  Provider location:   [facility]   PCP:  Nche, Charlene Brooke, NP  Cardiologist:  Fransico Him, MD  Electrophysiologist:  None   Chief Complaint:  F/u for HTN  History of Present Illness:    Shelly Cohen is a 58 y.o. female who presents via audio/video conferencing for a telehealth visit today.    The patient does not have symptoms concerning for COVID-19 infection (fever, chills, cough, or new shortness of breath).   This is a 58yo female with a history of HTN and thyroid disease who was referred by her PCP for evaluation of poorly controlled HTN.  She was seen in the ER 07/18/2018 for uncontrolled HTN and HA.  She had been on Lisinopril 20mg  daily, amlodipine 2.5mg  daily and Toprol XL 100mg  daily.  She did not tolerate diuretics in the past due to hypokalemia. Renal artery dopplers were normal. Of note, aldosterone and Aldo PRA ratio were normal.  Plasma catecholamine levels were normal but no urine catecholamines were done. After ED visit, her medications were adjusted. Amlodipine increased to 10 mg and lisinopril increased to 40 mg. Metoprolol was continued at 100 mg daily. Despite med changes, BP remained elevated and pt referred to cardiology.   She  was seen by Dr. Radford Pax, as a new pt, on 08/22/18. Office BP was 160/86. HR 87 bpm. Dr. Radford Pax felt that she likely has essential hypertension and she has a very strong family history of hypertension.  Her mother died from complications of hypertension. Dr. Radford Pax recommended a sleep study given her obesity (study not yet completed, scheduled for May). Dr. Radford Pax also ordered a 24-hour urine for catecholamines (labs were normal).  She also encouraged her to try to lose weight and get into an exercise program which would help with her hypertension.  Pt was continued on amlodipine 10 mg daily and  lisinopril 40 mg daily.  Dr. Radford Pax added a potassium sparing diuretic, spironolactone 25 mg daily, and also changed her  blocker from Toprol to carvedilol 25 mg twice daily w/ orders to get a repeat a bmet in 1 week. Pt instructed to follow-up in hypertension clinic in 1 week and then with a PA in 4 weeks.  Dr. Radford Pax plans to see her back in 6 months and if blood pressure has become controlled, she will then check a 2D echocardiogram to make sure she has not had any endorgan damage with LVH on echo.    Pt had f/u in the HTN clinic on 09/03/18. Pt denied any side effects w/ medication changes. She presented with a log of home BP readings, as follows: Home BP readings: 132/77, 144/81, 139/76, 124/72, 140/76, 134/80, 136/79, 133/79, 151/81, 131/83, 125/73, 148/80, 150/80, 132/66 152/84 HR 70-90's- stores readings in blood pressure app.  Labs were  checked during HTN clinic f/u and renal function and K were both WNL and pt was instructed to increase spironolactone to 50 mg daily. Recs were also for pt to get repeat BMP during f/u visit w/ APP.   Of note, since HTN clinic f/u, pt has undergone hip replacement surgery. This was done 09/12/18 by Dr. Rush Farmer.   Today in f/u, she reports that she has progressed well since her hip replacement surgery. Hip feels much better. Pain resolved. Her BP has been the best it has been in  a long time. She reports home BP readings in the 403K-[id number] 595G systolic. BP today is 130/83. Pulse is 74 bpm. Since her BP has been better controlled, her HAs have resolved. She denies CP and dyspnea. No side effects w/ medications.     Prior CV studies:   The following studies were reviewed today:  none  Past Medical History:  Diagnosis Date  . Alpha thalassemia trait   . Fibroadenoma   . Glaucoma    monitored every 67months, right eye worse than left   . History of positive PPD, treatment status unknown   . Hypertension   . Thyroid disease    Past Surgical History:  Procedure Laterality Date  . ABDOMINAL HYSTERECTOMY  1989  . BREAST BIOPSY  2013  . BUNIONECTOMY  10/2014  . CHOLECYSTECTOMY  2017  . COLONOSCOPY    . GALLBLADDER SURGERY    . hallux limi    . LAPAROSCOPY    . TOTAL HIP ARTHROPLASTY Right 09/12/2018   Procedure: RIGHT TOTAL HIP ARTHROPLASTY ANTERIOR APPROACH;  Surgeon: Mcarthur Rossetti, MD;  Location: [facility];  Service: Orthopedics;  Laterality: Right;  . TUBAL LIGATION       Current Meds  Medication Sig  . amLODipine (NORVASC) 10 MG tablet Take 1 tablet (10 mg total) by mouth daily.  . Ascorbic Acid (VITAMIN C PO) Take 1 tablet by mouth daily.   . carvedilol (COREG) 25 MG tablet Take 1 tablet (25 mg total) by mouth 2 (two) times daily.  Marland Kitchen docusate sodium (COLACE) 100 MG capsule Take 1 capsule (100 mg total) by mouth 2 (two) times daily. (Patient taking differently: Take 100 mg by mouth 2 (two) times daily as needed for moderate constipation. )  . Latanoprostene Bunod (VYZULTA) 0.024 % SOLN Apply 1 drop to eye 2 (two) times daily.  Marland Kitchen levothyroxine (SYNTHROID, LEVOTHROID) 50 MCG tablet Take 1 tablet (50 mcg total) by mouth daily before breakfast.  . lisinopril (PRINIVIL,ZESTRIL) 40 MG tablet Take 1 tablet (40 mg total) by mouth daily.  . Multiple Vitamin (MULTI-VITAMINS) TABS Take 1 tablet by mouth daily.   . Omega-3 Fatty Acids (FISH OIL PO) Take 1  capsule by mouth daily.   Marland Kitchen spironolactone (ALDACTONE) 25 MG tablet Take 1 tablet (25 mg total) by mouth daily.     Allergies:   Diphenhydramine hcl; Hydrocodone; Azithromycin; Cephalexin; Penicillin g; and Zolpidem   Social History   Tobacco Use  . Smoking status: Never Smoker  . Smokeless tobacco: Never Used  Substance Use Topics  . Alcohol use: No  . Drug use: No     Family Hx: The patient's family history includes Anemia in her mother; Arthritis in her mother; Cancer in her maternal grandfather and maternal grandmother; Cataracts in her mother and sister; Diabetes in her brother; Glaucoma in her brother and maternal aunt; Heart disease in her mother; Hypertension in her brother, mother, and sister; Kidney disease in her mother.  ROS:  Please see the history of present illness.     All other systems reviewed and are negative.   Labs/Other Tests and Data Reviewed:    Recent Labs: 01/08/2018: ALT 13 08/07/2018: TSH 1.80 09/13/2018: BUN 9; Creatinine, Ser 0.80; Hemoglobin 10.8; Platelets 305; Potassium 4.0; Sodium 138   Recent Lipid Panel Lab Results  Component Value Date/Time   CHOL 167 01/08/2018 08:55 AM   TRIG 80.0 01/08/2018 08:55 AM   HDL 55.80 01/08/2018 08:55 AM   CHOLHDL 3 01/08/2018 08:55 AM   LDLCALC 95 01/08/2018 08:55 AM    Wt Readings from Last 3 Encounters:  09/29/18 201 lb 8 oz (91.4 kg)  09/12/18 203 lb (92.1 kg)  09/08/18 203 lb (92.1 kg)     Exam:    Vital Signs:  BP 130/83   Pulse 74   Ht 5\' 3"  (1.6 m)   Wt 201 lb 8 oz (91.4 kg)   BMI 35.69 kg/m    Well nourished, well developed AA female in no acute distress. Looks to be in good spirits. Cheerful. Speaking in clear complete sentences with no labored speech and no labored breathing.    ASSESSMENT & PLAN:    1.  Essential HTN: work up thus far has been negative, including normal Renal artery dopplers, normal aldosterone and Aldo PRA ratio, normal plasma and urine catecholamines. Sleep  study was also recommended by Dr. Radford Pax. Study has not bet been completed (Scheduled for May). Current meds include Coreg 25 BID, amlodipine 10 mg, lisinopril 40 mg and spironolactone 50 mg, which was recently increased at last OV w/ the HTN clinic. She has not tolerated diuretics due to hypokalemia. BP today by home BP cuff is 130/83. Home BP readings have been well controlled in the 774J-287O systolic. Since her BP has been controlled, her HAs have resolved. She is tolerating current meds well w/o side effects. Given she is on a high dose of spironolactone that was just increased to 50 mg, I agree with pharmacist that she will need a repeat BMP to ensure that her renal function and potassium level are both ok. Despite concerns for COVID19, theses labs should not be delayed. Order has already been placed by pharmacist. We will arrange lab date for pt within the next 7 days. We also discussed advancing physical activity, once she fully recovers from her hip surgery and once cleared by ortho/ physical therapy. Once restrictions are lifted, I encouraged at least 30 min of walking, 5-7 days a week. Low sodium diet also encouraged.   COVID-19 Education: The signs and symptoms of COVID-19 were discussed with the patient and how to seek care for testing (follow up with PCP or arrange E-visit).  The importance of social distancing was discussed today.  Patient Risk:   After full review of this patients clinical status, I feel that they are at least moderate risk at this time.  Time:   Today, I have spent 15 minutes with the patient with telehealth technology discussing HTN and pharmacological and nonpharmocological treatment.     Medication Adjustments/Labs and Tests Ordered: Current medicines are reviewed at length with the patient today.  Concerns regarding medicines are outlined above.    Continue current plan of care. No medication changes made today.   Schedule lab appt for BMP next week (order has  already been placed by pharmacist).  Given she is on a high dose of spironolactone that was just increased to 50 mg, I agree with pharmacist that she will need  a repeat BMP to ensure that her renal function and potassium level are both ok. Despite concerns for COVID19, theses labs should not be delayed.  Refill spironolactone 50 mg once daily (50 mg tablets, #90, 3 refills)  Patient education materials sent to my chart, physical activity (once cleared by PT/orthopedics), 150 mg of physical activity a week.   Low sodium diet.   Continue to Monitor BP at home. BP goal <130/80. If persistently high BP above goal, call our office to notify, as medication changes may need to be made.   F/u with Dr. Radford Pax in 6 months.   Tests Ordered: No orders of the defined types were placed in this encounter.  BMP  Medication Changes: Meds ordered this encounter  Medications  . aspirin EC 81 MG tablet    Sig: Take 1 tablet (81 mg total) by mouth daily.    Dispense:  90 tablet    Refill:  3    Disposition:  Follow up 6 months with Dr. Radford Pax   Signed, Lyda Jester, PA-C  09/29/2018 3:15 PM    [place]

## 2018-09-29 NOTE — Patient Instructions (Signed)
Medication Instructions:  SPIRONOLACTONE 50 mg DAILY If you need a refill on your cardiac medications before your next appointment, please call your pharmacy.   Lab work:MONDAY 4/6 BMP If you have labs (blood work) drawn today and your tests are completely normal, you will receive your results only by: Marland Kitchen MyChart Message (if you have MyChart) OR . A paper copy in the mail If you have any lab test that is abnormal or we need to change your treatment, we will call you to review the results.  Testing/Procedures: NONE  Follow-Up: 6 MONTHS with Dr Radford Pax At [facility], you and your health needs are our priority.  As part of our continuing mission to provide you with exceptional heart care, we have created designated Provider Care Teams.  These Care Teams include your primary Cardiologist (physician) and Advanced Practice Providers (APPs -  Physician Assistants and Nurse Practitioners) who all work together to provide you with the care you need, when you need it. .   Any Other Special Instructions Will Be Listed Below (If Applicable). Physical Activity every week Monitor BP at home, goal is less than 130/80.  If consistently greater than 130/80, call our office  DASH Eating Plan [place] stands for "Dietary Approaches to Stop Hypertension." The DASH eating plan is a healthy eating plan that has been shown to reduce high blood pressure (hypertension). It may also reduce your risk for type 2 diabetes, heart disease, and stroke. The DASH eating plan may also help with weight loss. What are tips for following this plan?  General guidelines  Avoid eating more than 2,300 mg (milligrams) of salt (sodium) a day. If you have hypertension, you may need to reduce your sodium intake to 1,500 mg a day.  Limit alcohol intake to no more than 1 drink a day for nonpregnant women and 2 drinks a day for men. One drink equals 12 oz of beer, 5 oz of wine, or 1 oz of hard liquor.  Work with your health care  provider to maintain a healthy body weight or to lose weight. Ask what an ideal weight is for you.  Get at least 30 minutes of exercise that causes your heart to beat faster (aerobic exercise) most days of the week. Activities may include walking, swimming, or biking.  Work with your health care provider or diet and nutrition specialist (dietitian) to adjust your eating plan to your individual calorie needs. Reading food labels   Check food labels for the amount of sodium per serving. Choose foods with less than 5 percent of the Daily Value of sodium. Generally, foods with less than 300 mg of sodium per serving fit into this eating plan.  To find whole grains, look for the word "whole" as the first word in the ingredient list. Shopping  Buy products labeled as "low-sodium" or "no salt added."  Buy fresh foods. Avoid canned foods and premade or frozen meals. Cooking  Avoid adding salt when cooking. Use salt-free seasonings or herbs instead of table salt or sea salt. Check with your health care provider or pharmacist before using salt substitutes.  Do not fry foods. Cook foods using healthy methods such as baking, boiling, grilling, and broiling instead.  Cook with heart-healthy oils, such as olive, canola, soybean, or sunflower oil. Meal planning  Eat a balanced diet that includes: ? 5 or more servings of fruits and vegetables each day. At each meal, try to fill half of your plate with fruits and vegetables. ? Up to  6-8 servings of whole grains each day. ? Less than 6 oz of lean meat, poultry, or fish each day. A 3-oz serving of meat is about the same size as a deck of cards. One egg equals 1 oz. ? 2 servings of low-fat dairy each day. ? A serving of nuts, seeds, or beans 5 times each week. ? Heart-healthy fats. Healthy fats called Omega-3 fatty acids are found in foods such as flaxseeds and coldwater fish, like sardines, salmon, and mackerel.  Limit how much you eat of the following:  ? Canned or prepackaged foods. ? Food that is high in trans fat, such as fried foods. ? Food that is high in saturated fat, such as fatty meat. ? Sweets, desserts, sugary drinks, and other foods with added sugar. ? Full-fat dairy products.  Do not salt foods before eating.  Try to eat at least 2 vegetarian meals each week.  Eat more home-cooked food and less restaurant, buffet, and fast food.  When eating at a restaurant, ask that your food be prepared with less salt or no salt, if possible. What foods are recommended? The items listed may not be a complete list. Talk with your dietitian about what dietary choices are best for you. Grains Whole-grain or whole-wheat bread. Whole-grain or whole-wheat pasta. Brown rice. [place] Morrow. Bulgur. Whole-grain and low-sodium cereals. Pita bread. Low-fat, low-sodium crackers. Whole-wheat flour tortillas. Vegetables Fresh or frozen vegetables (raw, steamed, roasted, or grilled). Low-sodium or reduced-sodium tomato and vegetable juice. Low-sodium or reduced-sodium tomato sauce and tomato paste. Low-sodium or reduced-sodium canned vegetables. Fruits All fresh, dried, or frozen fruit. Canned fruit in natural juice (without added sugar). Meat and other protein foods Skinless chicken or [place]. Ground chicken or [place]. Pork with fat trimmed off. Fish and seafood. Egg whites. Dried beans, peas, or lentils. Unsalted nuts, nut butters, and seeds. Unsalted canned beans. Lean cuts of beef with fat trimmed off. Low-sodium, lean deli meat. Dairy Low-fat (1%) or fat-free (skim) milk. Fat-free, low-fat, or reduced-fat cheeses. Nonfat, low-sodium ricotta or cottage cheese. Low-fat or nonfat yogurt. Low-fat, low-sodium cheese. Fats and oils Soft margarine without trans fats. Vegetable oil. Low-fat, reduced-fat, or light mayonnaise and salad dressings (reduced-sodium). Canola, safflower, olive, soybean, and sunflower oils. Avocado. Seasoning and other foods  Herbs. Spices. Seasoning mixes without salt. Unsalted popcorn and pretzels. Fat-free sweets. What foods are not recommended? The items listed may not be a complete list. Talk with your dietitian about what dietary choices are best for you. Grains Baked goods made with fat, such as croissants, muffins, or some breads. Dry pasta or rice meal packs. Vegetables Creamed or fried vegetables. Vegetables in a cheese sauce. Regular canned vegetables (not low-sodium or reduced-sodium). Regular canned tomato sauce and paste (not low-sodium or reduced-sodium). Regular tomato and vegetable juice (not low-sodium or reduced-sodium). Angie Fava. Olives. Fruits Canned fruit in a light or heavy syrup. Fried fruit. Fruit in cream or butter sauce. Meat and other protein foods Fatty cuts of meat. Ribs. Fried meat. Berniece Salines. Sausage. Bologna and other processed lunch meats. Salami. Fatback. Hotdogs. Bratwurst. Salted nuts and seeds. Canned beans with added salt. Canned or smoked fish. Whole eggs or egg yolks. Chicken or [place] with skin. Dairy Whole or 2% milk, cream, and half-and-half. Whole or full-fat cream cheese. Whole-fat or sweetened yogurt. Full-fat cheese. Nondairy creamers. Whipped toppings. Processed cheese and cheese spreads. Fats and oils Butter. Stick margarine. Lard. Shortening. Ghee. Bacon fat. Tropical oils, such as coconut, palm kernel, or palm oil. Seasoning and  other foods Salted popcorn and pretzels. Onion salt, garlic salt, seasoned salt, table salt, and sea salt. Worcestershire sauce. Tartar sauce. Barbecue sauce. Teriyaki sauce. Soy sauce, including reduced-sodium. Steak sauce. Canned and packaged gravies. Fish sauce. Oyster sauce. Cocktail sauce. Horseradish that you find on the shelf. Ketchup. Mustard. Meat flavorings and tenderizers. Bouillon cubes. Hot sauce and Tabasco sauce. Premade or packaged marinades. Premade or packaged taco seasonings. Relishes. Regular salad dressings. Where to find more  information:  National Heart, Lung, and [place]: https://wilson-eaton.com/  American Heart Association: www.heart.org Summary  The DASH eating plan is a healthy eating plan that has been shown to reduce high blood pressure (hypertension). It may also reduce your risk for type 2 diabetes, heart disease, and stroke.  With the DASH eating plan, you should limit salt (sodium) intake to 2,300 mg a day. If you have hypertension, you may need to reduce your sodium intake to 1,500 mg a day.  When on the DASH eating plan, aim to eat more fresh fruits and vegetables, whole grains, lean proteins, low-fat dairy, and heart-healthy fats.  Work with your health care provider or diet and nutrition specialist (dietitian) to adjust your eating plan to your individual calorie needs. This information is not intended to replace advice given to you by your health care provider. Make sure you discuss any questions you have with your health care provider. Document Released: 06/07/2011 Document Revised: 06/11/2016 Document Reviewed: 06/11/2016 Elsevier Interactive Patient Education  2019 [employer].

## 2018-10-01 DIAGNOSIS — D509 Iron deficiency anemia, unspecified: Secondary | ICD-10-CM | POA: Diagnosis not present

## 2018-10-01 DIAGNOSIS — N6320 Unspecified lump in the left breast, unspecified quadrant: Secondary | ICD-10-CM | POA: Diagnosis not present

## 2018-10-01 DIAGNOSIS — D563 Thalassemia minor: Secondary | ICD-10-CM | POA: Diagnosis not present

## 2018-10-01 DIAGNOSIS — Z471 Aftercare following joint replacement surgery: Secondary | ICD-10-CM | POA: Diagnosis not present

## 2018-10-01 DIAGNOSIS — H401190 Primary open-angle glaucoma, unspecified eye, stage unspecified: Secondary | ICD-10-CM | POA: Diagnosis not present

## 2018-10-01 DIAGNOSIS — Z96641 Presence of right artificial hip joint: Secondary | ICD-10-CM | POA: Diagnosis not present

## 2018-10-01 DIAGNOSIS — E039 Hypothyroidism, unspecified: Secondary | ICD-10-CM | POA: Diagnosis not present

## 2018-10-01 DIAGNOSIS — E669 Obesity, unspecified: Secondary | ICD-10-CM | POA: Diagnosis not present

## 2018-10-01 DIAGNOSIS — I10 Essential (primary) hypertension: Secondary | ICD-10-CM | POA: Diagnosis not present

## 2018-10-03 DIAGNOSIS — E669 Obesity, unspecified: Secondary | ICD-10-CM | POA: Diagnosis not present

## 2018-10-03 DIAGNOSIS — E039 Hypothyroidism, unspecified: Secondary | ICD-10-CM | POA: Diagnosis not present

## 2018-10-03 DIAGNOSIS — H401190 Primary open-angle glaucoma, unspecified eye, stage unspecified: Secondary | ICD-10-CM | POA: Diagnosis not present

## 2018-10-03 DIAGNOSIS — D509 Iron deficiency anemia, unspecified: Secondary | ICD-10-CM | POA: Diagnosis not present

## 2018-10-03 DIAGNOSIS — N6320 Unspecified lump in the left breast, unspecified quadrant: Secondary | ICD-10-CM | POA: Diagnosis not present

## 2018-10-03 DIAGNOSIS — Z96641 Presence of right artificial hip joint: Secondary | ICD-10-CM | POA: Diagnosis not present

## 2018-10-03 DIAGNOSIS — I10 Essential (primary) hypertension: Secondary | ICD-10-CM | POA: Diagnosis not present

## 2018-10-03 DIAGNOSIS — Z471 Aftercare following joint replacement surgery: Secondary | ICD-10-CM | POA: Diagnosis not present

## 2018-10-03 DIAGNOSIS — D563 Thalassemia minor: Secondary | ICD-10-CM | POA: Diagnosis not present

## 2018-10-06 ENCOUNTER — Other Ambulatory Visit: Payer: 59

## 2018-10-06 ENCOUNTER — Other Ambulatory Visit: Payer: Self-pay

## 2018-10-06 DIAGNOSIS — D509 Iron deficiency anemia, unspecified: Secondary | ICD-10-CM | POA: Diagnosis not present

## 2018-10-06 DIAGNOSIS — E669 Obesity, unspecified: Secondary | ICD-10-CM | POA: Diagnosis not present

## 2018-10-06 DIAGNOSIS — I10 Essential (primary) hypertension: Secondary | ICD-10-CM | POA: Diagnosis not present

## 2018-10-06 DIAGNOSIS — Z96641 Presence of right artificial hip joint: Secondary | ICD-10-CM | POA: Diagnosis not present

## 2018-10-06 DIAGNOSIS — H401190 Primary open-angle glaucoma, unspecified eye, stage unspecified: Secondary | ICD-10-CM | POA: Diagnosis not present

## 2018-10-06 DIAGNOSIS — Z471 Aftercare following joint replacement surgery: Secondary | ICD-10-CM | POA: Diagnosis not present

## 2018-10-06 DIAGNOSIS — N6320 Unspecified lump in the left breast, unspecified quadrant: Secondary | ICD-10-CM | POA: Diagnosis not present

## 2018-10-06 DIAGNOSIS — E039 Hypothyroidism, unspecified: Secondary | ICD-10-CM | POA: Diagnosis not present

## 2018-10-06 DIAGNOSIS — D563 Thalassemia minor: Secondary | ICD-10-CM | POA: Diagnosis not present

## 2018-10-06 LAB — BASIC METABOLIC PANEL
BUN/Creatinine Ratio: 13 (ref 9–23)
BUN: 11 mg/dL (ref 6–24)
CO2: 22 mmol/L (ref 20–29)
Calcium: 9.9 mg/dL (ref 8.7–10.2)
Chloride: 105 mmol/L (ref 96–106)
Creatinine, Ser: 0.88 mg/dL (ref 0.57–1.00)
GFR calc Af Amer: 84 mL/min/{1.73_m2} (ref >59)
GFR calc non Af Amer: 73 mL/min/{1.73_m2} (ref >59)
Glucose: 118 mg/dL — ABNORMAL HIGH (ref 65–99)
Potassium: 3.8 mmol/L (ref 3.5–5.2)
Sodium: 144 mmol/L (ref 134–144)

## 2018-10-06 MED FILL — LEVOTHYROXINE 50 MCG TABLET: 50 | 90 days supply | Qty: 90 | Fill #0

## 2018-10-08 DIAGNOSIS — E039 Hypothyroidism, unspecified: Secondary | ICD-10-CM | POA: Diagnosis not present

## 2018-10-08 DIAGNOSIS — H401190 Primary open-angle glaucoma, unspecified eye, stage unspecified: Secondary | ICD-10-CM | POA: Diagnosis not present

## 2018-10-08 DIAGNOSIS — Z96641 Presence of right artificial hip joint: Secondary | ICD-10-CM | POA: Diagnosis not present

## 2018-10-08 DIAGNOSIS — D563 Thalassemia minor: Secondary | ICD-10-CM | POA: Diagnosis not present

## 2018-10-08 DIAGNOSIS — E669 Obesity, unspecified: Secondary | ICD-10-CM | POA: Diagnosis not present

## 2018-10-08 DIAGNOSIS — I10 Essential (primary) hypertension: Secondary | ICD-10-CM | POA: Diagnosis not present

## 2018-10-08 DIAGNOSIS — D509 Iron deficiency anemia, unspecified: Secondary | ICD-10-CM | POA: Diagnosis not present

## 2018-10-08 DIAGNOSIS — Z471 Aftercare following joint replacement surgery: Secondary | ICD-10-CM | POA: Diagnosis not present

## 2018-10-08 DIAGNOSIS — N6320 Unspecified lump in the left breast, unspecified quadrant: Secondary | ICD-10-CM | POA: Diagnosis not present

## 2018-10-13 DIAGNOSIS — Z471 Aftercare following joint replacement surgery: Secondary | ICD-10-CM | POA: Diagnosis not present

## 2018-10-13 DIAGNOSIS — E669 Obesity, unspecified: Secondary | ICD-10-CM | POA: Diagnosis not present

## 2018-10-13 DIAGNOSIS — D563 Thalassemia minor: Secondary | ICD-10-CM | POA: Diagnosis not present

## 2018-10-13 DIAGNOSIS — E039 Hypothyroidism, unspecified: Secondary | ICD-10-CM | POA: Diagnosis not present

## 2018-10-13 DIAGNOSIS — H401190 Primary open-angle glaucoma, unspecified eye, stage unspecified: Secondary | ICD-10-CM | POA: Diagnosis not present

## 2018-10-13 DIAGNOSIS — D509 Iron deficiency anemia, unspecified: Secondary | ICD-10-CM | POA: Diagnosis not present

## 2018-10-13 DIAGNOSIS — N6320 Unspecified lump in the left breast, unspecified quadrant: Secondary | ICD-10-CM | POA: Diagnosis not present

## 2018-10-13 DIAGNOSIS — I10 Essential (primary) hypertension: Secondary | ICD-10-CM | POA: Diagnosis not present

## 2018-10-13 DIAGNOSIS — Z96641 Presence of right artificial hip joint: Secondary | ICD-10-CM | POA: Diagnosis not present

## 2018-10-14 ENCOUNTER — Encounter (HOSPITAL_BASED_OUTPATIENT_CLINIC_OR_DEPARTMENT_OTHER): Payer: 59

## 2018-10-15 DIAGNOSIS — D563 Thalassemia minor: Secondary | ICD-10-CM | POA: Diagnosis not present

## 2018-10-15 DIAGNOSIS — H401190 Primary open-angle glaucoma, unspecified eye, stage unspecified: Secondary | ICD-10-CM | POA: Diagnosis not present

## 2018-10-15 DIAGNOSIS — D509 Iron deficiency anemia, unspecified: Secondary | ICD-10-CM | POA: Diagnosis not present

## 2018-10-15 DIAGNOSIS — Z471 Aftercare following joint replacement surgery: Secondary | ICD-10-CM | POA: Diagnosis not present

## 2018-10-15 DIAGNOSIS — E039 Hypothyroidism, unspecified: Secondary | ICD-10-CM | POA: Diagnosis not present

## 2018-10-15 DIAGNOSIS — E669 Obesity, unspecified: Secondary | ICD-10-CM | POA: Diagnosis not present

## 2018-10-15 DIAGNOSIS — Z96641 Presence of right artificial hip joint: Secondary | ICD-10-CM | POA: Diagnosis not present

## 2018-10-15 DIAGNOSIS — N6320 Unspecified lump in the left breast, unspecified quadrant: Secondary | ICD-10-CM | POA: Diagnosis not present

## 2018-10-15 DIAGNOSIS — I10 Essential (primary) hypertension: Secondary | ICD-10-CM | POA: Diagnosis not present

## 2018-10-20 DIAGNOSIS — N6320 Unspecified lump in the left breast, unspecified quadrant: Secondary | ICD-10-CM | POA: Diagnosis not present

## 2018-10-20 DIAGNOSIS — D509 Iron deficiency anemia, unspecified: Secondary | ICD-10-CM | POA: Diagnosis not present

## 2018-10-20 DIAGNOSIS — E669 Obesity, unspecified: Secondary | ICD-10-CM | POA: Diagnosis not present

## 2018-10-20 DIAGNOSIS — Z471 Aftercare following joint replacement surgery: Secondary | ICD-10-CM | POA: Diagnosis not present

## 2018-10-20 DIAGNOSIS — I10 Essential (primary) hypertension: Secondary | ICD-10-CM | POA: Diagnosis not present

## 2018-10-20 DIAGNOSIS — E039 Hypothyroidism, unspecified: Secondary | ICD-10-CM | POA: Diagnosis not present

## 2018-10-20 DIAGNOSIS — D563 Thalassemia minor: Secondary | ICD-10-CM | POA: Diagnosis not present

## 2018-10-20 DIAGNOSIS — Z96641 Presence of right artificial hip joint: Secondary | ICD-10-CM | POA: Diagnosis not present

## 2018-10-20 DIAGNOSIS — H401190 Primary open-angle glaucoma, unspecified eye, stage unspecified: Secondary | ICD-10-CM | POA: Diagnosis not present

## 2018-10-21 ENCOUNTER — Telehealth (INDEPENDENT_AMBULATORY_CARE_PROVIDER_SITE_OTHER): Payer: Self-pay | Admitting: Orthopaedic Surgery

## 2018-10-21 NOTE — Telephone Encounter (Signed)
Rosalie Doctor P.T. w/ [facility]- request extension for P.T.  2 x week for 1 week, callback  [phone]

## 2018-10-21 NOTE — Telephone Encounter (Signed)
Verbal order given  

## 2018-10-22 DIAGNOSIS — D563 Thalassemia minor: Secondary | ICD-10-CM | POA: Diagnosis not present

## 2018-10-22 DIAGNOSIS — Z471 Aftercare following joint replacement surgery: Secondary | ICD-10-CM | POA: Diagnosis not present

## 2018-10-22 DIAGNOSIS — Z96641 Presence of right artificial hip joint: Secondary | ICD-10-CM | POA: Diagnosis not present

## 2018-10-22 DIAGNOSIS — D509 Iron deficiency anemia, unspecified: Secondary | ICD-10-CM | POA: Diagnosis not present

## 2018-10-22 DIAGNOSIS — E669 Obesity, unspecified: Secondary | ICD-10-CM | POA: Diagnosis not present

## 2018-10-22 DIAGNOSIS — E039 Hypothyroidism, unspecified: Secondary | ICD-10-CM | POA: Diagnosis not present

## 2018-10-22 DIAGNOSIS — H401190 Primary open-angle glaucoma, unspecified eye, stage unspecified: Secondary | ICD-10-CM | POA: Diagnosis not present

## 2018-10-22 DIAGNOSIS — N6320 Unspecified lump in the left breast, unspecified quadrant: Secondary | ICD-10-CM | POA: Diagnosis not present

## 2018-10-22 DIAGNOSIS — I10 Essential (primary) hypertension: Secondary | ICD-10-CM | POA: Diagnosis not present

## 2018-10-23 ENCOUNTER — Other Ambulatory Visit: Payer: Self-pay

## 2018-10-23 ENCOUNTER — Encounter (INDEPENDENT_AMBULATORY_CARE_PROVIDER_SITE_OTHER): Payer: Self-pay | Admitting: Orthopaedic Surgery

## 2018-10-23 ENCOUNTER — Ambulatory Visit (INDEPENDENT_AMBULATORY_CARE_PROVIDER_SITE_OTHER): Payer: 59 | Admitting: Orthopaedic Surgery

## 2018-10-23 DIAGNOSIS — Z96641 Presence of right artificial hip joint: Secondary | ICD-10-CM

## 2018-10-23 NOTE — Progress Notes (Signed)
HPI: [name] returns today 6 weeks status post right total hip arthroplasty.  She states she is overall doing well she still ambulating with a cane.  She has some stiffness particularly with the first steps.  States her range of motion and strength are improving.  She is wanting to return to work on May 11.  She feels by my 11 she should be able to ambulate without a cane at least for half the day.  Physical exam: Right hip surgical incision is well healed without any signs of infection.  Supple nontender dorsiflexion plantarflexion ankle intact.  She ambulates with a cane with a slight antalgic gait.  Impression: 6-week status post right total hip arthroplasty  Plan: She is given a handicap permit.  She will continue to work on range of motion strengthening right hip.  Also work on scar tissue mobilization.  All lower than return to work on May 11 half days for 1 week and then full duties thereafter.  Questions were encouraged and answered at length.  We will see her back in 1 month for reevaluation.

## 2018-10-29 MED FILL — SPIRONOLACTONE 50 MG TABS: 50 | 90 days supply | Qty: 90 | Fill #0

## 2018-10-30 ENCOUNTER — Ambulatory Visit (INDEPENDENT_AMBULATORY_CARE_PROVIDER_SITE_OTHER): Payer: 59 | Admitting: Nurse Practitioner

## 2018-10-30 ENCOUNTER — Encounter: Payer: Self-pay | Admitting: Nurse Practitioner

## 2018-10-30 VITALS — BP 127/80 | HR 85 | Temp 97.5°F | Ht 63.0 in | Wt 202.0 lb

## 2018-10-30 DIAGNOSIS — I10 Essential (primary) hypertension: Secondary | ICD-10-CM | POA: Diagnosis not present

## 2018-10-30 DIAGNOSIS — E039 Hypothyroidism, unspecified: Secondary | ICD-10-CM

## 2018-10-30 NOTE — Progress Notes (Signed)
Virtual Visit via Video Note  I connected with Shelly Cohen on 10/30/18 at  9:45 AM EDT by a video enabled telemedicine application and verified that I am speaking with the correct person using two identifiers.  Location: Patient: home Provider: office  CC: follow up HTN--med refills? FYI--surgery went well,going back to work on 11/10/2018, saw heart doctor.   I discussed the limitations of evaluation and management by telemedicine and the availability of in person appointments. The patient expressed understanding and agreed to proceed.  History of Present Illness: Hypothyroidism: Stable with 27mcg levothyroxine.  HTN: Controlled BP with amlodipine, spironolactone, coreg, and lisinopril. Also under care of cardiology: Dr. Radford Pax. BP Readings from Last 3 Encounters:  10/30/18 127/80  09/29/18 130/83  09/14/18 136/71   S/p right hip arthroplasty: Complete PT and released to start walking on treadmill Denies LE edema, or uncontrollable pain.  Review of Systems  Constitutional: Negative.   Respiratory: Negative.   Cardiovascular: Negative.   Gastrointestinal: Negative for constipation and nausea.  Musculoskeletal: Negative for falls.  Neurological: Negative.   Psychiatric/Behavioral: Negative.    Observations/Objective: Physical Exam  Constitutional: She is oriented to person, place, and time. No distress.  Pulmonary/Chest: Effort normal.  Musculoskeletal:        General: No edema.  Neurological: She is alert and oriented to person, place, and time.  Psychiatric: She has a normal mood and affect. Her behavior is normal. Thought content normal.  Vitals reviewed.  Assessment and Plan: Shelly Cohen was seen today for follow-up.  Diagnoses and all orders for this visit:  Hypothyroidism, unspecified type   Follow Up Instructions: Continue current medications F/up in 68months for CPE (fasting)   I discussed the assessment and treatment plan with the patient. The patient  was provided an opportunity to ask questions and all were answered. The patient agreed with the plan and demonstrated an understanding of the instructions.   The patient was advised to call back or seek an in-person evaluation if the symptoms worsen or if the condition fails to improve as anticipated.   Wilfred Lacy, NP

## 2018-10-30 NOTE — Assessment & Plan Note (Signed)
controlled BP with amlodipine, spironolactone, coreg, and lisinopril. Also under care of cardiology: Dr. Radford Pax. BP Readings from Last 3 Encounters:  10/30/18 127/80  09/29/18 130/83  09/14/18 136/71   BMP Latest Ref Rng & Units 10/06/2018 09/13/2018 09/03/2018  Glucose 65 - 99 mg/dL 118(H) 153(H) 99  BUN 6 - 24 mg/dL 11 9 10   Creatinine 0.57 - 1.00 mg/dL 0.88 0.80 0.76  BUN/Creat Ratio 9 - 23 13 - 13  Sodium 134 - 144 mmol/L 144 138 145(H)  Potassium 3.5 - 5.2 mmol/L 3.8 4.0 4.1  Chloride 96 - 106 mmol/L 105 104 105  CO2 20 - 29 mmol/L 22 24 23   Calcium 8.7 - 10.2 mg/dL 9.9 9.0 9.7

## 2018-11-07 ENCOUNTER — Encounter (HOSPITAL_BASED_OUTPATIENT_CLINIC_OR_DEPARTMENT_OTHER): Payer: 59

## 2018-11-18 MED FILL — AMLODIPINE BESYLATE 10 MG T: 10 | 90 days supply | Qty: 90 | Fill #0

## 2018-11-18 MED FILL — CARVEDILOL 25 MG TABLET: 25 | 90 days supply | Qty: 180 | Fill #0

## 2018-11-19 ENCOUNTER — Ambulatory Visit (INDEPENDENT_AMBULATORY_CARE_PROVIDER_SITE_OTHER): Payer: 59 | Admitting: Orthopaedic Surgery

## 2018-11-19 ENCOUNTER — Encounter: Payer: Self-pay | Admitting: Orthopaedic Surgery

## 2018-11-19 ENCOUNTER — Other Ambulatory Visit: Payer: Self-pay

## 2018-11-19 DIAGNOSIS — Z96641 Presence of right artificial hip joint: Secondary | ICD-10-CM

## 2018-11-19 NOTE — Progress Notes (Signed)
HPI: [name] returns today just over 2 months status post right total hip arthroplasty.  She is overall doing well.  States she is still unable to lie on the right hip.  She is back at work full duties.  She has no complaints.  She has had no fevers chills.  Physical exam: Right hip slight decreased range of motion with internal and external rotation.  Right calf supple nontender.  Dorsiflexion plantarflexion ankle intact on the right.  Ambulates without any assistive device and a nonantalgic gait.  Impression status post: Right total hip arthroplasty 09/12/2018  Plan: She will work on range of motion of the right hip.  Also discussed with her IT band stretching.  She will follow-up with [place] in 4 months for reevaluation range of motion of the hip.  Questions encouraged and answered at length.  She can always follow-up sooner if there is any questions or concerns.

## 2018-11-30 ENCOUNTER — Other Ambulatory Visit: Payer: 59

## 2018-11-30 ENCOUNTER — Telehealth: Payer: Self-pay | Admitting: Cardiology

## 2018-11-30 DIAGNOSIS — Z20822 Contact with and (suspected) exposure to covid-19: Secondary | ICD-10-CM

## 2018-11-30 NOTE — Telephone Encounter (Signed)
Spoke with patient regarding recent office visit 5/20 and possible Covid exposure.  Covid test ordered and appt 5/31 @ [place]

## 2018-12-01 LAB — NOVEL CORONAVIRUS, NAA: SARS-CoV-2, NAA: NOT DETECTED

## 2018-12-01 MED FILL — LISINOPRIL 40 MG TABLET: 40 | 90 days supply | Qty: 90 | Fill #1

## 2019-01-01 ENCOUNTER — Encounter: Payer: Self-pay | Admitting: Nurse Practitioner

## 2019-01-09 ENCOUNTER — Encounter (HOSPITAL_BASED_OUTPATIENT_CLINIC_OR_DEPARTMENT_OTHER): Payer: 59

## 2019-01-14 ENCOUNTER — Other Ambulatory Visit: Payer: Self-pay | Admitting: Nurse Practitioner

## 2019-01-14 DIAGNOSIS — E039 Hypothyroidism, unspecified: Secondary | ICD-10-CM

## 2019-01-14 MED FILL — LEVOTHYROXINE 50 MCG TABLET: 50 | 90 days supply | Qty: 90 | Fill #0

## 2019-02-03 ENCOUNTER — Other Ambulatory Visit: Payer: Self-pay

## 2019-02-03 ENCOUNTER — Encounter: Payer: 59 | Admitting: Nurse Practitioner

## 2019-02-03 ENCOUNTER — Ambulatory Visit (INDEPENDENT_AMBULATORY_CARE_PROVIDER_SITE_OTHER): Payer: 59 | Admitting: Physician Assistant

## 2019-02-03 ENCOUNTER — Ambulatory Visit (INDEPENDENT_AMBULATORY_CARE_PROVIDER_SITE_OTHER): Payer: 59

## 2019-02-03 ENCOUNTER — Encounter: Payer: Self-pay | Admitting: Physician Assistant

## 2019-02-03 DIAGNOSIS — M25552 Pain in left hip: Secondary | ICD-10-CM

## 2019-02-03 DIAGNOSIS — M7061 Trochanteric bursitis, right hip: Secondary | ICD-10-CM | POA: Diagnosis not present

## 2019-02-03 DIAGNOSIS — M25551 Pain in right hip: Secondary | ICD-10-CM | POA: Diagnosis not present

## 2019-02-03 MED ORDER — METHYLPREDNISOLONE ACETATE 40 MG/ML IJ SUSP
40.0000 mg | INTRAMUSCULAR | Status: AC | PRN
  Administered 2019-02-03: 40 mg via INTRA_ARTICULAR

## 2019-02-03 MED ORDER — LIDOCAINE HCL 1 % IJ SOLN
3.0000 mL | INTRAMUSCULAR | Status: AC | PRN
  Administered 2019-02-03: 3 mL

## 2019-02-03 MED ORDER — SPIRONOLACTONE 50 MG PO TABS: 50.0000 mg | ORAL_TABLET | Freq: Every day | ORAL | 2 refills | Status: DC

## 2019-02-03 MED FILL — SPIRONOLACTONE 50 MG TABS: 50 | 90 days supply | Qty: 90 | Fill #0

## 2019-02-03 NOTE — Telephone Encounter (Signed)
Pt's medication was sent to pt's pharmacy as requested. Confirmation received.  °

## 2019-02-03 NOTE — Progress Notes (Signed)
Office Visit Note   Patient: Shelly Cohen           Date of Birth: 06/15/1961           MRN: [id number] Visit Date: 02/03/2019              Requested by: Flossie Buffy, NP [place],  [place] [id number] PCP: Flossie Buffy, NP   Assessment & Plan: Visit Diagnoses:  1. Bilateral hip pain     Plan: We will have her follow-up in 3 to 4 weeks.  She will perform IT band stretching exercises as shown and discussed today.  Questions were encouraged and answered at length.  Follow-Up Instructions: 3 to 4 weeks  Orders:  Orders Placed This Encounter  Procedures  . XR HIPS BILAT W OR W/O PELVIS 2V   No orders of the defined types were placed in this encounter.     Procedures: Large Joint Inj: R greater trochanter on 02/03/2019 5:18 PM Indications: pain Details: 22 G 1.5 in needle, lateral approach  Arthrogram: No  Medications: 3 mL lidocaine 1 %; 40 mg methylPREDNISolone acetate 40 MG/ML Outcome: tolerated well, no immediate complications Procedure, treatment alternatives, risks and benefits explained, specific risks discussed. Consent was given by the patient. Immediately prior to procedure a time out was called to verify the correct patient, procedure, equipment, support staff and site/side marked as required. Patient was prepped and draped in the usual sterile fashion.       Clinical Data: No additional findings.   Subjective: Chief Complaint  Patient presents with  . Left Hip - Pain  . Right Hip - Pain    HPI Shelly Cohen comes in today complaining of bilateral hip pain especially at night whenever she tries to lie on either hip.  Her right hip is most painful.  She underwent a right total hip arthroplasty on 09/12/2018 and is overall doing well with the hip other than pain lateral aspect of the hip.  She is having difficulty getting in and out of the car due to the pain lateral aspect hip.  She is having no numbness tingling  down either leg.  She is had no fevers chills shortness of breath.  Notes that the numbness about her right hip incision is dissipating. Review of Systems Please see HPI  Objective: Vital Signs: There were no vitals taken for this visit.  Physical Exam General: Well-developed well-nourished female no acute distress mood affect appropriate Ortho Exam Bilateral hips good range of motion.  She has pain with external rotation of the right hip.  Tenderness over the trochanteric region of both hips right greater than left.  Specialty Comments:  No specialty comments available.  Imaging: Xr Hips Bilat W Or W/o Pelvis 2v  Result Date: 02/03/2019 AP pelvis and lateral views of both hips: Bilateral hips well located.  No acute fractures.  Right hip total arthroplasty components are well-seated without signs of complication.  Left hip joint space is slightly narrowed.    PMFS History: Patient Active Problem List   Diagnosis Date Noted  . Status post total replacement of right hip 09/12/2018  . Obesity (BMI 30-39.9) 08/22/2018  . Unilateral primary osteoarthritis, right hip 08/21/2018  . Trigger thumb, left thumb 05/07/2018  . OA (osteoarthritis) of hip 04/22/2018  . Open angle primary glaucoma 01/08/2018  . Mass of breast, left 06/29/2016  . Thalassemia trait, alpha 06/29/2016  . Iron deficiency anemia 06/29/2016  . S/P bunionectomy 06/29/2016  .  Great toe pain, right 06/29/2016  . Encounter for weight loss counseling 06/29/2016  . HTN (hypertension), benign 05/18/2016  . Hypothyroidism 05/18/2016   Past Medical History:  Diagnosis Date  . Alpha thalassemia trait   . Fibroadenoma   . Glaucoma    monitored every 66months, right eye worse than left   . History of positive PPD, treatment status unknown   . Hypertension   . Thyroid disease     Family History  Problem Relation Age of Onset  . Hypertension Mother   . Anemia Mother   . Arthritis Mother        rheumatoid arthritis   . Kidney disease Mother   . Cataracts Mother   . Heart disease Mother   . Hypertension Sister   . Cataracts Sister   . Hypertension Brother   . Glaucoma Brother   . Diabetes Brother   . Cancer Maternal Grandmother        leukemia  . Cancer Maternal Grandfather        lung  . Glaucoma Maternal Aunt     Past Surgical History:  Procedure Laterality Date  . ABDOMINAL HYSTERECTOMY  1989  . BREAST BIOPSY  2013  . BUNIONECTOMY  10/2014  . CHOLECYSTECTOMY  2017  . COLONOSCOPY    . GALLBLADDER SURGERY    . hallux limi    . LAPAROSCOPY    . TOTAL HIP ARTHROPLASTY Right 09/12/2018   Procedure: RIGHT TOTAL HIP ARTHROPLASTY ANTERIOR APPROACH;  Surgeon: Mcarthur Rossetti, MD;  Location: [facility];  Service: Orthopedics;  Laterality: Right;  . TUBAL LIGATION     Social History   Occupational History  . Not on file  Tobacco Use  . Smoking status: Never Smoker  . Smokeless tobacco: Never Used  Substance and Sexual Activity  . Alcohol use: No  . Drug use: No  . Sexual activity: Yes    Birth control/protection: Surgical

## 2019-02-05 ENCOUNTER — Encounter: Payer: 59 | Admitting: Nurse Practitioner

## 2019-02-06 ENCOUNTER — Other Ambulatory Visit: Payer: Self-pay

## 2019-02-06 ENCOUNTER — Encounter: Payer: Self-pay | Admitting: Nurse Practitioner

## 2019-02-06 ENCOUNTER — Ambulatory Visit (INDEPENDENT_AMBULATORY_CARE_PROVIDER_SITE_OTHER): Payer: 59 | Admitting: Nurse Practitioner

## 2019-02-06 VITALS — BP 150/80 | HR 73 | Temp 97.8°F | Ht 63.0 in | Wt 205.4 lb

## 2019-02-06 DIAGNOSIS — I10 Essential (primary) hypertension: Secondary | ICD-10-CM | POA: Diagnosis not present

## 2019-02-06 DIAGNOSIS — D500 Iron deficiency anemia secondary to blood loss (chronic): Secondary | ICD-10-CM | POA: Diagnosis not present

## 2019-02-06 DIAGNOSIS — Z13220 Encounter for screening for lipoid disorders: Secondary | ICD-10-CM | POA: Diagnosis not present

## 2019-02-06 DIAGNOSIS — E039 Hypothyroidism, unspecified: Secondary | ICD-10-CM

## 2019-02-06 DIAGNOSIS — Z136 Encounter for screening for cardiovascular disorders: Secondary | ICD-10-CM | POA: Diagnosis not present

## 2019-02-06 DIAGNOSIS — Z0000 Encounter for general adult medical examination without abnormal findings: Secondary | ICD-10-CM

## 2019-02-06 DIAGNOSIS — R739 Hyperglycemia, unspecified: Secondary | ICD-10-CM

## 2019-02-06 LAB — COMPREHENSIVE METABOLIC PANEL
ALT: 11 U/L (ref 0–35)
AST: 14 U/L (ref 0–37)
Albumin: 4.3 g/dL (ref 3.5–5.2)
Alkaline Phosphatase: 80 U/L (ref 39–117)
BUN: 17 mg/dL (ref 6–23)
CO2: 28 mEq/L (ref 19–32)
Calcium: 10.1 mg/dL (ref 8.4–10.5)
Chloride: 103 mEq/L (ref 96–112)
Creatinine, Ser: 0.78 mg/dL (ref 0.40–1.20)
GFR: 91.9 mL/min (ref >60.00)
Glucose, Bld: 101 mg/dL — ABNORMAL HIGH (ref 70–99)
Potassium: 4 mEq/L (ref 3.5–5.1)
Sodium: 141 mEq/L (ref 135–145)
Total Bilirubin: 0.3 mg/dL (ref 0.2–1.2)
Total Protein: 8.3 g/dL (ref 6.0–8.3)

## 2019-02-06 LAB — TSH: TSH: 2.85 u[IU]/mL (ref 0.35–4.50)

## 2019-02-06 LAB — LIPID PANEL
Cholesterol: 186 mg/dL (ref 0–200)
HDL: 69.5 mg/dL (ref >39.00)
LDL Cholesterol: 105 mg/dL — ABNORMAL HIGH (ref 0–99)
NonHDL: 116.12
Total CHOL/HDL Ratio: 3
Triglycerides: 55 mg/dL (ref 0.0–149.0)
VLDL: 11 mg/dL (ref 0.0–40.0)

## 2019-02-06 LAB — CBC
HCT: 37.1 % (ref 36.0–46.0)
Hemoglobin: 11.7 g/dL — ABNORMAL LOW (ref 12.0–15.0)
MCHC: 31.6 g/dL (ref 30.0–36.0)
MCV: 76 fl — ABNORMAL LOW (ref 78.0–100.0)
Platelets: 327 10*3/uL (ref 150.0–400.0)
RBC: 4.88 Mil/uL (ref 3.87–5.11)
RDW: 18.3 % — ABNORMAL HIGH (ref 11.5–15.5)
WBC: 9.8 10*3/uL (ref 4.0–10.5)

## 2019-02-06 LAB — HEMOGLOBIN A1C: Hgb A1c MFr Bld: 6.2 % (ref 4.6–6.5)

## 2019-02-06 LAB — T4, FREE: Free T4: 0.95 ng/dL (ref 0.60–1.60)

## 2019-02-06 MED ORDER — LEVOTHYROXINE SODIUM 50 MCG PO TABS: 50.0000 ug | ORAL_TABLET | Freq: Every day | ORAL | 1 refills | Status: DC

## 2019-02-06 NOTE — Patient Instructions (Signed)
Go to lab for blood draw.  Make appt with ophthalmology and sleep center.  Maintain appt with dentist.  Health Maintenance, Female Adopting a healthy lifestyle and getting preventive care are important in promoting health and wellness. Ask your health care provider about:  The right schedule for you to have regular tests and exams.  Things you can do on your own to prevent diseases and keep yourself healthy. What should I know about diet, weight, and exercise? Eat a healthy diet   Eat a diet that includes plenty of vegetables, fruits, low-fat dairy products, and lean protein.  Do not eat a lot of foods that are high in solid fats, added sugars, or sodium. Maintain a healthy weight Body mass index (BMI) is used to identify weight problems. It estimates body fat based on height and weight. Your health care provider can help determine your BMI and help you achieve or maintain a healthy weight. Get regular exercise Get regular exercise. This is one of the most important things you can do for your health. Most adults should:  Exercise for at least 150 minutes each week. The exercise should increase your heart rate and make you sweat (moderate-intensity exercise).  Do strengthening exercises at least twice a week. This is in addition to the moderate-intensity exercise.  Spend less time sitting. Even light physical activity can be beneficial. Watch cholesterol and blood lipids Have your blood tested for lipids and cholesterol at 58 years of age, then have this test every 5 years. Have your cholesterol levels checked more often if:  Your lipid or cholesterol levels are high.  You are older than 58 years of age.  You are at high risk for heart disease. What should I know about cancer screening? Depending on your health history and family history, you may need to have cancer screening at various ages. This may include screening for:  Breast cancer.  Cervical cancer.  Colorectal  cancer.  Skin cancer.  Lung cancer. What should I know about heart disease, diabetes, and high blood pressure? Blood pressure and heart disease  High blood pressure causes heart disease and increases the risk of stroke. This is more likely to develop in people who have high blood pressure readings, are of African descent, or are overweight.  Have your blood pressure checked: ? Every 3-5 years if you are 58-61 years of age. ? Every year if you are 58 years old or older. Diabetes Have regular diabetes screenings. This checks your fasting blood sugar level. Have the screening done:  Once every three years after age 39 if you are at a normal weight and have a low risk for diabetes.  More often and at a younger age if you are overweight or have a high risk for diabetes. What should I know about preventing infection? Hepatitis B If you have a higher risk for hepatitis B, you should be screened for this virus. Talk with your health care provider to find out if you are at risk for hepatitis B infection. Hepatitis C Testing is recommended for:  Everyone born from [birth year] through 1965.  Anyone with known risk factors for hepatitis C. Sexually transmitted infections (STIs)  Get screened for STIs, including gonorrhea and chlamydia, if: ? You are sexually active and are younger than 58 years of age. ? You are older than 58 years of age and your health care provider tells you that you are at risk for this type of infection. ? Your sexual activity has changed since  you were last screened, and you are at increased risk for chlamydia or gonorrhea. Ask your health care provider if you are at risk.  Ask your health care provider about whether you are at high risk for HIV. Your health care provider may recommend a prescription medicine to help prevent HIV infection. If you choose to take medicine to prevent HIV, you should first get tested for HIV. You should then be tested every 3 months for as long as  you are taking the medicine. Pregnancy  If you are about to stop having your period (premenopausal) and you may become pregnant, seek counseling before you get pregnant.  Take 400 to 800 micrograms (mcg) of folic acid every day if you become pregnant.  Ask for birth control (contraception) if you want to prevent pregnancy. Osteoporosis and menopause Osteoporosis is a disease in which the bones lose minerals and strength with aging. This can result in bone fractures. If you are 58 years old or older, or if you are at risk for osteoporosis and fractures, ask your health care provider if you should:  Be screened for bone loss.  Take a calcium or vitamin D supplement to lower your risk of fractures.  Be given hormone replacement therapy (HRT) to treat symptoms of menopause. Follow these instructions at home: Lifestyle  Do not use any products that contain nicotine or tobacco, such as cigarettes, e-cigarettes, and chewing tobacco. If you need help quitting, ask your health care provider.  Do not use street drugs.  Do not share needles.  Ask your health care provider for help if you need support or information about quitting drugs. Alcohol use  Do not drink alcohol if: ? Your health care provider tells you not to drink. ? You are pregnant, may be pregnant, or are planning to become pregnant.  If you drink alcohol: ? Limit how much you use to 0-1 drink a day. ? Limit intake if you are breastfeeding.  Be aware of how much alcohol is in your drink. In the [place] one drink equals one 12 oz bottle of beer (355 mL), one 5 oz glass of wine (148 mL), or one 1 oz glass of hard liquor (44 mL). General instructions  Schedule regular health, dental, and eye exams.  Stay current with your vaccines.  Tell your health care provider if: ? You often feel depressed. ? You have ever been abused or do not feel safe at home. Summary  Adopting a healthy lifestyle and getting preventive care are  important in promoting health and wellness.  Follow your health care provider's instructions about healthy diet, exercising, and getting tested or screened for diseases.  Follow your health care provider's instructions on monitoring your cholesterol and blood pressure. This information is not intended to replace advice given to you by your health care provider. Make sure you discuss any questions you have with your health care provider. Document Released: 01/01/2011 Document Revised: 06/11/2018 Document Reviewed: 06/11/2018 Elsevier Patient Education  2020 [employer].

## 2019-02-06 NOTE — Progress Notes (Signed)
Subjective:    Patient ID: Shelly Cohen, female    DOB: 08-14-1960, 58 y.o.   MRN: [id number]  Patient presents today for complete physical and re eval of chronic conditions:  HPI  HTN: Elevated in office, but low at home Reports Home readings: 130/79, 118/65, 139/76, 126/70, 129/69, 118/74. Compliant with amlodipine, coreg, lisinopril, and spinolactone. Sleep study was canceled due to [place] pandemic. She plans to call and reschedule. BP Readings from Last 3 Encounters:  02/06/19 (!) 150/80  10/30/18 127/80  09/29/18 130/83   Hypothyroidism: No weight fluctuation, no fatigue, no hair loss Compliant with levothyroxine.  Sexual History (orientation,birth control, marital status, STD):married, sexually active, s/p hysterectomy  Depression/Suicide: Depression screen [facility] 2/9 08/07/2018 01/08/2018 06/14/2017 10/19/2016 10/05/2016 05/18/2016  Decreased Interest 0 0 0 0 0 0  Down, Depressed, Hopeless - 0 0 0 0 0  PHQ - 2 Score 0 0 0 0 0 0   Vision:up to date  Dental:up to date  Immunizations: (TDAP, Hep C screen, Pneumovax, Influenza, zoster)  Health Maintenance  Topic Date Due  . Flu Shot  01/31/2019  . Mammogram  11/06/2019  . Colon Cancer Screening  09/20/2022  . Tetanus Vaccine  12/27/2027  .  Hepatitis C: One time screening is recommended by Center for Disease Control  (CDC) for  adults born from [birth year] through 1965.   Completed  . HIV Screening  Completed  . Pap Smear  Discontinued   Diet:small portions, heart healthy.  Weight:  Wt Readings from Last 3 Encounters:  02/06/19 205 lb 6.4 oz (93.2 kg)  10/30/18 202 lb (91.6 kg)  09/29/18 201 lb 8 oz (91.4 kg)    Exercise:limited due to recent hip arthropalsty  Fall Risk: Fall Risk  08/07/2018 01/08/2018 06/14/2017 10/19/2016 10/05/2016 05/18/2016  Falls in the past year? 0 No Yes No No No  Number falls in past yr: - - 1 - - -  Injury with Fall? - - Yes - - -  Comment - - hit left side face - - -   Advanced  Directive: Advanced Directives 09/12/2018  Does Patient Have a Medical Advance Directive? No  Would patient like information on creating a medical advance directive? No - Patient declined    Medications and allergies reviewed with patient and updated if appropriate.  Patient Active Problem List   Diagnosis Date Noted  . Status post total replacement of right hip 09/12/2018  . Obesity (BMI 30-39.9) 08/22/2018  . Trigger thumb, left thumb 05/07/2018  . OA (osteoarthritis) of hip 04/22/2018  . Open angle primary glaucoma 01/08/2018  . Thalassemia trait, alpha 06/29/2016  . Iron deficiency anemia 06/29/2016  . S/P bunionectomy 06/29/2016  . HTN (hypertension), benign 05/18/2016  . Hypothyroidism 05/18/2016    Current Outpatient Medications on File Prior to Visit  Medication Sig Dispense Refill  . amLODipine (NORVASC) 10 MG tablet Take 1 tablet (10 mg total) by mouth daily. 90 tablet 3  . Ascorbic Acid (VITAMIN C PO) Take 1 tablet by mouth daily.     . carvedilol (COREG) 25 MG tablet Take 1 tablet (25 mg total) by mouth 2 (two) times daily. 180 tablet 3  . docusate sodium (COLACE) 100 MG capsule Take 1 capsule (100 mg total) by mouth 2 (two) times daily. (Patient taking differently: Take 100 mg by mouth 2 (two) times daily as needed for moderate constipation. ) 10 capsule 0  . Latanoprostene Bunod (VYZULTA) 0.024 % SOLN Apply 1 drop to eye 2 (two) times  daily.    . lisinopril (PRINIVIL,ZESTRIL) 40 MG tablet Take 1 tablet (40 mg total) by mouth daily. 90 tablet 3  . Multiple Vitamin (MULTI-VITAMINS) TABS Take 1 tablet by mouth daily.     . Omega-3 Fatty Acids (FISH OIL PO) Take 1 capsule by mouth daily.     Marland Kitchen spironolactone (ALDACTONE) 50 MG tablet Take 1 tablet (50 mg total) by mouth daily. 90 tablet 2   No current facility-administered medications on file prior to visit.     Past Medical History:  Diagnosis Date  . Alpha thalassemia trait   . Fibroadenoma   . Glaucoma    monitored  every 53months, right eye worse than left   . History of positive PPD, treatment status unknown   . Hypertension   . Thyroid disease     Past Surgical History:  Procedure Laterality Date  . ABDOMINAL HYSTERECTOMY  1989  . BREAST BIOPSY  2013  . BUNIONECTOMY  10/2014  . CHOLECYSTECTOMY  2017  . COLONOSCOPY    . GALLBLADDER SURGERY    . hallux limi    . LAPAROSCOPY    . TOTAL HIP ARTHROPLASTY Right 09/12/2018   Procedure: RIGHT TOTAL HIP ARTHROPLASTY ANTERIOR APPROACH;  Surgeon: Mcarthur Rossetti, MD;  Location: [facility];  Service: Orthopedics;  Laterality: Right;  . TUBAL LIGATION      Social History   Socioeconomic History  . Marital status: Married    Spouse name: Not on file  . Number of children: Not on file  . Years of education: Not on file  . Highest education level: Not on file  Occupational History  . Not on file  Social Needs  . Financial resource strain: Not on file  . Food insecurity    Worry: Not on file    Inability: Not on file  . Transportation needs    Medical: Not on file    Non-medical: Not on file  Tobacco Use  . Smoking status: Never Smoker  . Smokeless tobacco: Never Used  Substance and Sexual Activity  . Alcohol use: No  . Drug use: No  . Sexual activity: Yes    Birth control/protection: Surgical  Lifestyle  . Physical activity    Days per week: Not on file    Minutes per session: Not on file  . Stress: Not on file  Relationships  . Social Herbalist on phone: Not on file    Gets together: Not on file    Attends religious service: Not on file    Active member of club or organization: Not on file    Attends meetings of clubs or organizations: Not on file    Relationship status: Not on file  Other Topics Concern  . Not on file  Social History Narrative  . Not on file    Family History  Problem Relation Age of Onset  . Hypertension Mother   . Anemia Mother   . Arthritis Mother        rheumatoid arthritis  . Kidney  disease Mother   . Cataracts Mother   . Heart disease Mother   . Hypertension Sister   . Cataracts Sister   . Hypertension Brother   . Glaucoma Brother   . Diabetes Brother   . Cancer Maternal Grandmother        leukemia  . Cancer Maternal Grandfather        lung  . Glaucoma Maternal Aunt  Review of Systems  Constitutional: Negative for fever, malaise/fatigue and weight loss.  HENT: Negative for congestion and sore throat.   Eyes:       Negative for visual changes  Respiratory: Negative for cough and shortness of breath.   Cardiovascular: Negative for chest pain, palpitations and leg swelling.  Gastrointestinal: Negative for blood in stool, constipation, diarrhea and heartburn.  Genitourinary: Negative for dysuria, frequency and urgency.  Musculoskeletal: Negative for falls, joint pain and myalgias.  Skin: Negative for rash.  Neurological: Negative for dizziness, sensory change and headaches.  Endo/Heme/Allergies: Does not bruise/bleed easily.  Psychiatric/Behavioral: Negative for depression, substance abuse and suicidal ideas. The patient is not nervous/anxious.     Objective:   Vitals:   02/06/19 0809  BP: (!) 150/80  Pulse: 73  Temp: 97.8 F (36.6 C)  SpO2: 99%    Body mass index is 36.38 kg/m.   Physical Examination:  Physical Exam Vitals signs reviewed.  Constitutional:      General: She is not in acute distress.    Appearance: She is well-developed.  HENT:     Head: Normocephalic.     Right Ear: Ear canal and external ear normal. There is impacted cerumen.     Left Ear: Tympanic membrane, ear canal and external ear normal.     Mouth/Throat:     Pharynx: No oropharyngeal exudate.  Eyes:     Extraocular Movements: Extraocular movements intact.     Conjunctiva/sclera: Conjunctivae normal.  Neck:     Musculoskeletal: Normal range of motion and neck supple.  Cardiovascular:     Rate and Rhythm: Normal rate and regular rhythm.     Pulses:  Normal pulses.     Heart sounds: Normal heart sounds.  Pulmonary:     Effort: Pulmonary effort is normal. No respiratory distress.     Breath sounds: Normal breath sounds.  Chest:     Chest wall: No tenderness.  Abdominal:     General: Bowel sounds are normal.     Palpations: Abdomen is soft.  Genitourinary:    Comments: Ds/p hysterectomy, denies any vaginal symptoms. Musculoskeletal: Normal range of motion.  Lymphadenopathy:     Cervical: No cervical adenopathy.  Skin:    General: Skin is warm and dry.     Findings: No rash.  Neurological:     Mental Status: She is alert and oriented to person, place, and time.     Deep Tendon Reflexes: Reflexes are normal and symmetric.  Psychiatric:        Mood and Affect: Mood normal.        Behavior: Behavior normal.        Thought Content: Thought content normal.     ASSESSMENT and PLAN:  Bethannie was seen today for annual exam.  Diagnoses and all orders for this visit:  Encounter for preventative adult health care examination -     Lipid panel  HTN (hypertension), benign -     Comprehensive metabolic panel  Hypothyroidism, unspecified type -     Comprehensive metabolic panel -     TSH -     T4, free -     levothyroxine (SYNTHROID) 50 MCG tablet; Take 1 tablet (50 mcg total) by mouth daily before breakfast.  Encounter for lipid screening for cardiovascular disease -     Lipid panel  Hyperglycemia -     Hemoglobin A1c  Iron deficiency anemia due to chronic blood loss -     CBC   No problem-specific  Assessment & Plan notes found for this encounter.     Problem List Items Addressed This Visit      Cardiovascular and Mediastinum   HTN (hypertension), benign   Relevant Orders   Comprehensive metabolic panel (Completed)     Endocrine   Hypothyroidism   Relevant Medications   levothyroxine (SYNTHROID) 50 MCG tablet   Other Relevant Orders   Comprehensive metabolic panel (Completed)   TSH (Completed)   T4, free  (Completed)     Other   Iron deficiency anemia   Relevant Orders   CBC (Completed)    Other Visit Diagnoses    Encounter for preventative adult health care examination    -  Primary   Relevant Orders   Lipid panel (Completed)   Encounter for lipid screening for cardiovascular disease       Relevant Orders   Lipid panel (Completed)   Hyperglycemia       Relevant Orders   Hemoglobin A1c (Completed)       Follow up: Return in about 6 months (around 08/09/2019) for HTN and hypothyroidism.  Wilfred Lacy, NP

## 2019-02-26 ENCOUNTER — Other Ambulatory Visit: Payer: Self-pay

## 2019-02-26 ENCOUNTER — Telehealth: Payer: Self-pay | Admitting: Orthopaedic Surgery

## 2019-02-26 MED ORDER — AMOXICILLIN 500 MG PO TABS: ORAL_TABLET | ORAL | 0 refills | Status: DC

## 2019-02-26 MED FILL — AMOXICILLIN 500 MG CAPSULE: 500 | 1 days supply | Qty: 8 | Fill #0

## 2019-02-26 NOTE — Telephone Encounter (Signed)
Patient aware this was called in for her  

## 2019-02-26 NOTE — Telephone Encounter (Signed)
Patient called and stated and had hip replacement in March and wanted to know if she needs antibiotics? Patient had cleaning today and there was an infection there. What kind of antibiotic was given to her during surgery?  Please call patient @ [phone]

## 2019-02-26 NOTE — Telephone Encounter (Signed)
Since there was an infection in her mouth, if the dentist didn't put her on anything, call in Amoxicillin 500 mg twice daily for the next week.

## 2019-03-01 MED FILL — AMLODIPINE BESYLATE 10 MG T: 10 | 90 days supply | Qty: 90 | Fill #1

## 2019-03-10 ENCOUNTER — Other Ambulatory Visit: Payer: Self-pay | Admitting: Nurse Practitioner

## 2019-03-10 DIAGNOSIS — I10 Essential (primary) hypertension: Secondary | ICD-10-CM

## 2019-03-10 MED FILL — LISINOPRIL 40 MG TABLET: 40 | 90 days supply | Qty: 90 | Fill #0

## 2019-03-10 NOTE — Telephone Encounter (Signed)
Rx from 08/2018 cancel from [facility] and resend in 6 mo supply to [facility].

## 2019-03-23 ENCOUNTER — Ambulatory Visit (INDEPENDENT_AMBULATORY_CARE_PROVIDER_SITE_OTHER): Payer: 59 | Admitting: Physician Assistant

## 2019-03-23 ENCOUNTER — Encounter: Payer: Self-pay | Admitting: Physician Assistant

## 2019-03-23 ENCOUNTER — Other Ambulatory Visit: Payer: Self-pay

## 2019-03-23 VITALS — Ht 63.0 in | Wt 205.0 lb

## 2019-03-23 DIAGNOSIS — M25552 Pain in left hip: Secondary | ICD-10-CM | POA: Diagnosis not present

## 2019-03-23 DIAGNOSIS — M25551 Pain in right hip: Secondary | ICD-10-CM | POA: Diagnosis not present

## 2019-03-23 NOTE — Progress Notes (Signed)
HPI: [name] returns today due to bilateral hip pain.  Left greater than right.  She had a right trochanteric injection on 02/03/2019.  She states it helped some and she has been doing home exercises for IT band stretching but these are hard to do due to pain in both hips.  Again she is undergone a right total hip arthroplasty on 09/11/2025 no groin pain.  Left hip she has slight narrowing on radiographs of the joint space.  However she has no groin pain on the left knee pain is mainly in the medial lateral aspect of her right hip.  Physical exam: Bilateral hips good range of motion with discomfort with extreme external rotation of both hips.  Tenderness over the trochanteric region of both hips.  Impression: Bilateral trochanteric bursitis  Plan: At this point time we will send her to physical therapy for IT band stretching modalities home exercise program and range of motion both hips.  She will follow-up with [place] in 8 weeks for reexamination.  Questions encouraged and answered

## 2019-04-01 DIAGNOSIS — M25551 Pain in right hip: Secondary | ICD-10-CM | POA: Diagnosis not present

## 2019-04-01 DIAGNOSIS — Z96641 Presence of right artificial hip joint: Secondary | ICD-10-CM | POA: Diagnosis not present

## 2019-04-01 DIAGNOSIS — M6281 Muscle weakness (generalized): Secondary | ICD-10-CM | POA: Diagnosis not present

## 2019-04-01 DIAGNOSIS — M25651 Stiffness of right hip, not elsewhere classified: Secondary | ICD-10-CM | POA: Diagnosis not present

## 2019-04-01 DIAGNOSIS — M25552 Pain in left hip: Secondary | ICD-10-CM | POA: Diagnosis not present

## 2019-04-01 DIAGNOSIS — R262 Difficulty in walking, not elsewhere classified: Secondary | ICD-10-CM | POA: Diagnosis not present

## 2019-04-06 DIAGNOSIS — M25651 Stiffness of right hip, not elsewhere classified: Secondary | ICD-10-CM | POA: Diagnosis not present

## 2019-04-06 DIAGNOSIS — M6281 Muscle weakness (generalized): Secondary | ICD-10-CM | POA: Diagnosis not present

## 2019-04-06 DIAGNOSIS — M25551 Pain in right hip: Secondary | ICD-10-CM | POA: Diagnosis not present

## 2019-04-06 DIAGNOSIS — M25552 Pain in left hip: Secondary | ICD-10-CM | POA: Diagnosis not present

## 2019-04-06 DIAGNOSIS — Z96641 Presence of right artificial hip joint: Secondary | ICD-10-CM | POA: Diagnosis not present

## 2019-04-06 DIAGNOSIS — R262 Difficulty in walking, not elsewhere classified: Secondary | ICD-10-CM | POA: Diagnosis not present

## 2019-04-08 DIAGNOSIS — M25651 Stiffness of right hip, not elsewhere classified: Secondary | ICD-10-CM | POA: Diagnosis not present

## 2019-04-08 DIAGNOSIS — R262 Difficulty in walking, not elsewhere classified: Secondary | ICD-10-CM | POA: Diagnosis not present

## 2019-04-08 DIAGNOSIS — Z96641 Presence of right artificial hip joint: Secondary | ICD-10-CM | POA: Diagnosis not present

## 2019-04-08 DIAGNOSIS — M25551 Pain in right hip: Secondary | ICD-10-CM | POA: Diagnosis not present

## 2019-04-08 DIAGNOSIS — M6281 Muscle weakness (generalized): Secondary | ICD-10-CM | POA: Diagnosis not present

## 2019-04-08 DIAGNOSIS — M25552 Pain in left hip: Secondary | ICD-10-CM | POA: Diagnosis not present

## 2019-04-09 DIAGNOSIS — R262 Difficulty in walking, not elsewhere classified: Secondary | ICD-10-CM | POA: Diagnosis not present

## 2019-04-09 DIAGNOSIS — M25551 Pain in right hip: Secondary | ICD-10-CM | POA: Diagnosis not present

## 2019-04-09 DIAGNOSIS — M25651 Stiffness of right hip, not elsewhere classified: Secondary | ICD-10-CM | POA: Diagnosis not present

## 2019-04-09 DIAGNOSIS — Z96641 Presence of right artificial hip joint: Secondary | ICD-10-CM | POA: Diagnosis not present

## 2019-04-09 DIAGNOSIS — M25552 Pain in left hip: Secondary | ICD-10-CM | POA: Diagnosis not present

## 2019-04-09 DIAGNOSIS — M6281 Muscle weakness (generalized): Secondary | ICD-10-CM | POA: Diagnosis not present

## 2019-04-12 MED FILL — CARVEDILOL 25 MG TABLET: 25 | 90 days supply | Qty: 180 | Fill #1

## 2019-04-13 DIAGNOSIS — Z96641 Presence of right artificial hip joint: Secondary | ICD-10-CM | POA: Diagnosis not present

## 2019-04-13 DIAGNOSIS — M25552 Pain in left hip: Secondary | ICD-10-CM | POA: Diagnosis not present

## 2019-04-13 DIAGNOSIS — M25651 Stiffness of right hip, not elsewhere classified: Secondary | ICD-10-CM | POA: Diagnosis not present

## 2019-04-13 DIAGNOSIS — M25551 Pain in right hip: Secondary | ICD-10-CM | POA: Diagnosis not present

## 2019-04-13 DIAGNOSIS — M6281 Muscle weakness (generalized): Secondary | ICD-10-CM | POA: Diagnosis not present

## 2019-04-13 DIAGNOSIS — R262 Difficulty in walking, not elsewhere classified: Secondary | ICD-10-CM | POA: Diagnosis not present

## 2019-04-13 MED FILL — LEVOTHYROXINE 50 MCG TABLET: 50 | 90 days supply | Qty: 90 | Fill #0

## 2019-04-15 DIAGNOSIS — M25552 Pain in left hip: Secondary | ICD-10-CM | POA: Diagnosis not present

## 2019-04-15 DIAGNOSIS — M6281 Muscle weakness (generalized): Secondary | ICD-10-CM | POA: Diagnosis not present

## 2019-04-15 DIAGNOSIS — M25551 Pain in right hip: Secondary | ICD-10-CM | POA: Diagnosis not present

## 2019-04-15 DIAGNOSIS — R262 Difficulty in walking, not elsewhere classified: Secondary | ICD-10-CM | POA: Diagnosis not present

## 2019-04-15 DIAGNOSIS — Z96641 Presence of right artificial hip joint: Secondary | ICD-10-CM | POA: Diagnosis not present

## 2019-04-15 DIAGNOSIS — M25651 Stiffness of right hip, not elsewhere classified: Secondary | ICD-10-CM | POA: Diagnosis not present

## 2019-04-16 DIAGNOSIS — Z96641 Presence of right artificial hip joint: Secondary | ICD-10-CM | POA: Diagnosis not present

## 2019-04-16 DIAGNOSIS — M25551 Pain in right hip: Secondary | ICD-10-CM | POA: Diagnosis not present

## 2019-04-16 DIAGNOSIS — M6281 Muscle weakness (generalized): Secondary | ICD-10-CM | POA: Diagnosis not present

## 2019-04-16 DIAGNOSIS — M25552 Pain in left hip: Secondary | ICD-10-CM | POA: Diagnosis not present

## 2019-04-16 DIAGNOSIS — R262 Difficulty in walking, not elsewhere classified: Secondary | ICD-10-CM | POA: Diagnosis not present

## 2019-04-16 DIAGNOSIS — M25651 Stiffness of right hip, not elsewhere classified: Secondary | ICD-10-CM | POA: Diagnosis not present

## 2019-04-20 DIAGNOSIS — M6281 Muscle weakness (generalized): Secondary | ICD-10-CM | POA: Diagnosis not present

## 2019-04-20 DIAGNOSIS — M25651 Stiffness of right hip, not elsewhere classified: Secondary | ICD-10-CM | POA: Diagnosis not present

## 2019-04-20 DIAGNOSIS — Z96641 Presence of right artificial hip joint: Secondary | ICD-10-CM | POA: Diagnosis not present

## 2019-04-20 DIAGNOSIS — R262 Difficulty in walking, not elsewhere classified: Secondary | ICD-10-CM | POA: Diagnosis not present

## 2019-04-20 DIAGNOSIS — M25552 Pain in left hip: Secondary | ICD-10-CM | POA: Diagnosis not present

## 2019-04-20 DIAGNOSIS — M25551 Pain in right hip: Secondary | ICD-10-CM | POA: Diagnosis not present

## 2019-04-22 DIAGNOSIS — M25552 Pain in left hip: Secondary | ICD-10-CM | POA: Diagnosis not present

## 2019-04-22 DIAGNOSIS — M25651 Stiffness of right hip, not elsewhere classified: Secondary | ICD-10-CM | POA: Diagnosis not present

## 2019-04-22 DIAGNOSIS — M25551 Pain in right hip: Secondary | ICD-10-CM | POA: Diagnosis not present

## 2019-04-22 DIAGNOSIS — Z96641 Presence of right artificial hip joint: Secondary | ICD-10-CM | POA: Diagnosis not present

## 2019-04-22 DIAGNOSIS — R262 Difficulty in walking, not elsewhere classified: Secondary | ICD-10-CM | POA: Diagnosis not present

## 2019-04-22 DIAGNOSIS — M6281 Muscle weakness (generalized): Secondary | ICD-10-CM | POA: Diagnosis not present

## 2019-04-27 DIAGNOSIS — M25552 Pain in left hip: Secondary | ICD-10-CM | POA: Diagnosis not present

## 2019-04-27 DIAGNOSIS — M25551 Pain in right hip: Secondary | ICD-10-CM | POA: Diagnosis not present

## 2019-04-27 DIAGNOSIS — M6281 Muscle weakness (generalized): Secondary | ICD-10-CM | POA: Diagnosis not present

## 2019-04-27 DIAGNOSIS — M25651 Stiffness of right hip, not elsewhere classified: Secondary | ICD-10-CM | POA: Diagnosis not present

## 2019-04-27 DIAGNOSIS — Z96641 Presence of right artificial hip joint: Secondary | ICD-10-CM | POA: Diagnosis not present

## 2019-04-27 DIAGNOSIS — R262 Difficulty in walking, not elsewhere classified: Secondary | ICD-10-CM | POA: Diagnosis not present

## 2019-04-29 DIAGNOSIS — M25552 Pain in left hip: Secondary | ICD-10-CM | POA: Diagnosis not present

## 2019-04-29 DIAGNOSIS — R262 Difficulty in walking, not elsewhere classified: Secondary | ICD-10-CM | POA: Diagnosis not present

## 2019-04-29 DIAGNOSIS — M25651 Stiffness of right hip, not elsewhere classified: Secondary | ICD-10-CM | POA: Diagnosis not present

## 2019-04-29 DIAGNOSIS — M6281 Muscle weakness (generalized): Secondary | ICD-10-CM | POA: Diagnosis not present

## 2019-04-29 DIAGNOSIS — M25551 Pain in right hip: Secondary | ICD-10-CM | POA: Diagnosis not present

## 2019-04-29 DIAGNOSIS — Z96641 Presence of right artificial hip joint: Secondary | ICD-10-CM | POA: Diagnosis not present

## 2019-05-03 MED FILL — SPIRONOLACTONE 50 MG TABS: 50 | 90 days supply | Qty: 90 | Fill #1

## 2019-05-04 DIAGNOSIS — M25651 Stiffness of right hip, not elsewhere classified: Secondary | ICD-10-CM | POA: Diagnosis not present

## 2019-05-04 DIAGNOSIS — Z96641 Presence of right artificial hip joint: Secondary | ICD-10-CM | POA: Diagnosis not present

## 2019-05-04 DIAGNOSIS — M6281 Muscle weakness (generalized): Secondary | ICD-10-CM | POA: Diagnosis not present

## 2019-05-04 DIAGNOSIS — R262 Difficulty in walking, not elsewhere classified: Secondary | ICD-10-CM | POA: Diagnosis not present

## 2019-05-04 DIAGNOSIS — M25552 Pain in left hip: Secondary | ICD-10-CM | POA: Diagnosis not present

## 2019-05-04 DIAGNOSIS — M25551 Pain in right hip: Secondary | ICD-10-CM | POA: Diagnosis not present

## 2019-05-06 DIAGNOSIS — R262 Difficulty in walking, not elsewhere classified: Secondary | ICD-10-CM | POA: Diagnosis not present

## 2019-05-06 DIAGNOSIS — Z96641 Presence of right artificial hip joint: Secondary | ICD-10-CM | POA: Diagnosis not present

## 2019-05-06 DIAGNOSIS — M25651 Stiffness of right hip, not elsewhere classified: Secondary | ICD-10-CM | POA: Diagnosis not present

## 2019-05-06 DIAGNOSIS — M25552 Pain in left hip: Secondary | ICD-10-CM | POA: Diagnosis not present

## 2019-05-06 DIAGNOSIS — M25551 Pain in right hip: Secondary | ICD-10-CM | POA: Diagnosis not present

## 2019-05-06 DIAGNOSIS — M6281 Muscle weakness (generalized): Secondary | ICD-10-CM | POA: Diagnosis not present

## 2019-05-11 DIAGNOSIS — Z96641 Presence of right artificial hip joint: Secondary | ICD-10-CM | POA: Diagnosis not present

## 2019-05-11 DIAGNOSIS — M25651 Stiffness of right hip, not elsewhere classified: Secondary | ICD-10-CM | POA: Diagnosis not present

## 2019-05-11 DIAGNOSIS — M25551 Pain in right hip: Secondary | ICD-10-CM | POA: Diagnosis not present

## 2019-05-11 DIAGNOSIS — M25552 Pain in left hip: Secondary | ICD-10-CM | POA: Diagnosis not present

## 2019-05-11 DIAGNOSIS — M6281 Muscle weakness (generalized): Secondary | ICD-10-CM | POA: Diagnosis not present

## 2019-05-11 DIAGNOSIS — R262 Difficulty in walking, not elsewhere classified: Secondary | ICD-10-CM | POA: Diagnosis not present

## 2019-05-13 DIAGNOSIS — M25552 Pain in left hip: Secondary | ICD-10-CM | POA: Diagnosis not present

## 2019-05-13 DIAGNOSIS — Z96641 Presence of right artificial hip joint: Secondary | ICD-10-CM | POA: Diagnosis not present

## 2019-05-13 DIAGNOSIS — M25551 Pain in right hip: Secondary | ICD-10-CM | POA: Diagnosis not present

## 2019-05-13 DIAGNOSIS — M25651 Stiffness of right hip, not elsewhere classified: Secondary | ICD-10-CM | POA: Diagnosis not present

## 2019-05-13 DIAGNOSIS — M6281 Muscle weakness (generalized): Secondary | ICD-10-CM | POA: Diagnosis not present

## 2019-05-13 DIAGNOSIS — R262 Difficulty in walking, not elsewhere classified: Secondary | ICD-10-CM | POA: Diagnosis not present

## 2019-05-18 DIAGNOSIS — M25552 Pain in left hip: Secondary | ICD-10-CM | POA: Diagnosis not present

## 2019-05-18 DIAGNOSIS — M6281 Muscle weakness (generalized): Secondary | ICD-10-CM | POA: Diagnosis not present

## 2019-05-18 DIAGNOSIS — M25651 Stiffness of right hip, not elsewhere classified: Secondary | ICD-10-CM | POA: Diagnosis not present

## 2019-05-18 DIAGNOSIS — M25551 Pain in right hip: Secondary | ICD-10-CM | POA: Diagnosis not present

## 2019-05-18 DIAGNOSIS — Z96641 Presence of right artificial hip joint: Secondary | ICD-10-CM | POA: Diagnosis not present

## 2019-05-18 DIAGNOSIS — R262 Difficulty in walking, not elsewhere classified: Secondary | ICD-10-CM | POA: Diagnosis not present

## 2019-05-20 DIAGNOSIS — M25552 Pain in left hip: Secondary | ICD-10-CM | POA: Diagnosis not present

## 2019-05-20 DIAGNOSIS — M25551 Pain in right hip: Secondary | ICD-10-CM | POA: Diagnosis not present

## 2019-05-20 DIAGNOSIS — M6281 Muscle weakness (generalized): Secondary | ICD-10-CM | POA: Diagnosis not present

## 2019-05-20 DIAGNOSIS — R262 Difficulty in walking, not elsewhere classified: Secondary | ICD-10-CM | POA: Diagnosis not present

## 2019-05-20 DIAGNOSIS — Z96641 Presence of right artificial hip joint: Secondary | ICD-10-CM | POA: Diagnosis not present

## 2019-05-20 DIAGNOSIS — M25651 Stiffness of right hip, not elsewhere classified: Secondary | ICD-10-CM | POA: Diagnosis not present

## 2019-05-25 ENCOUNTER — Other Ambulatory Visit: Payer: Self-pay

## 2019-05-25 ENCOUNTER — Ambulatory Visit (INDEPENDENT_AMBULATORY_CARE_PROVIDER_SITE_OTHER): Payer: 59 | Admitting: Physician Assistant

## 2019-05-25 ENCOUNTER — Encounter: Payer: Self-pay | Admitting: Physician Assistant

## 2019-05-25 DIAGNOSIS — M25551 Pain in right hip: Secondary | ICD-10-CM | POA: Diagnosis not present

## 2019-05-25 DIAGNOSIS — M7061 Trochanteric bursitis, right hip: Secondary | ICD-10-CM | POA: Diagnosis not present

## 2019-05-25 DIAGNOSIS — M25552 Pain in left hip: Secondary | ICD-10-CM

## 2019-05-25 DIAGNOSIS — M25651 Stiffness of right hip, not elsewhere classified: Secondary | ICD-10-CM | POA: Diagnosis not present

## 2019-05-25 DIAGNOSIS — Z96641 Presence of right artificial hip joint: Secondary | ICD-10-CM | POA: Diagnosis not present

## 2019-05-25 DIAGNOSIS — M6281 Muscle weakness (generalized): Secondary | ICD-10-CM | POA: Diagnosis not present

## 2019-05-25 DIAGNOSIS — R262 Difficulty in walking, not elsewhere classified: Secondary | ICD-10-CM | POA: Diagnosis not present

## 2019-05-25 NOTE — Addendum Note (Signed)
Addended by: Michae Kava B on: 05/25/2019 09:45 AM   Modules accepted: Orders

## 2019-05-25 NOTE — Progress Notes (Signed)
Office Visit Note   Patient: Shelly Cohen           Date of Birth: 1960-07-29           MRN: [id number] Visit Date: 05/25/2019              Requested by: Flossie Buffy, NP [place],  [place] [address] PCP: Flossie Buffy, NP   Assessment & Plan: Visit Diagnoses:  1. Trochanteric bursitis, right hip   2. Pain in left hip     Plan: We will send her for an intra-articular injection left hip.  Have her follow-up in 2 weeks see what type of response she had to this.  Follow-Up Instructions: Return in about 2 weeks (around 06/08/2019).   Orders:  No orders of the defined types were placed in this encounter.  No orders of the defined types were placed in this encounter.     Procedures: No procedures performed   Clinical Data: No additional findings.   Subjective: Chief Complaint  Patient presents with  . Right Hip - Follow-up  . Left Hip - Follow-up    HPI Shelly Cohen returns today follow-up of her bilateral trochanteric bursitis.  She states that physical therapy has helped the injections help but she still having significant pain in the left hip.  And she underwent an MRI of her pelvis back in January of this year and at that time had significant arthritis of the right hip and underwent right total hip arthroplasty.  Left hip was visualized and showed only mild arthritic changes.  She has had no acute injury to the left hip.  Just feels that it is not improving with therapy like the right hip.  She does have some groin pain but mostly lateral left hip pain. Review of Systems Please see HPI.  Negative for fevers chills shortness of breath chest pain.  Objective: Vital Signs: There were no vitals taken for this visit.  Physical Exam General well-developed well-nourished female no acute distress.  Psych: Alert and oriented x3 Ortho Exam Follow hips tenderness over trochanteric region left greater than right.  Good range of  motion right hip without pain.  Internal and external rotation of the left hip full range of motion but painful. Specialty Comments:  No specialty comments available.  Imaging: No results found.   PMFS History: Patient Active Problem List   Diagnosis Date Noted  . Status post total replacement of right hip 09/12/2018  . Obesity (BMI 30-39.9) 08/22/2018  . Trigger thumb, left thumb 05/07/2018  . OA (osteoarthritis) of hip 04/22/2018  . Open angle primary glaucoma 01/08/2018  . Thalassemia trait, alpha 06/29/2016  . Iron deficiency anemia 06/29/2016  . S/P bunionectomy 06/29/2016  . HTN (hypertension), benign 05/18/2016  . Hypothyroidism 05/18/2016   Past Medical History:  Diagnosis Date  . Alpha thalassemia trait   . Fibroadenoma   . Glaucoma    monitored every 91months, right eye worse than left   . History of positive PPD, treatment status unknown   . Hypertension   . Thyroid disease     Family History  Problem Relation Age of Onset  . Hypertension Mother   . Anemia Mother   . Arthritis Mother        rheumatoid arthritis  . Kidney disease Mother   . Cataracts Mother   . Heart disease Mother   . Hypertension Sister   . Cataracts Sister   . Hypertension Brother   .  Glaucoma Brother   . Diabetes Brother   . Cancer Maternal Grandmother        leukemia  . Cancer Maternal Grandfather        lung  . Glaucoma Maternal Aunt     Past Surgical History:  Procedure Laterality Date  . ABDOMINAL HYSTERECTOMY  1989  . BREAST BIOPSY  2013  . BUNIONECTOMY  10/2014  . CHOLECYSTECTOMY  2017  . COLONOSCOPY    . GALLBLADDER SURGERY    . hallux limi    . LAPAROSCOPY    . TOTAL HIP ARTHROPLASTY Right 09/12/2018   Procedure: RIGHT TOTAL HIP ARTHROPLASTY ANTERIOR APPROACH;  Surgeon: Mcarthur Rossetti, MD;  Location: [facility];  Service: Orthopedics;  Laterality: Right;  . TUBAL LIGATION     Social History   Occupational History  . Not on file  Tobacco Use  . Smoking  status: Never Smoker  . Smokeless tobacco: Never Used  Substance and Sexual Activity  . Alcohol use: No  . Drug use: No  . Sexual activity: Yes    Birth control/protection: Surgical

## 2019-06-01 DIAGNOSIS — R262 Difficulty in walking, not elsewhere classified: Secondary | ICD-10-CM | POA: Diagnosis not present

## 2019-06-01 DIAGNOSIS — M25651 Stiffness of right hip, not elsewhere classified: Secondary | ICD-10-CM | POA: Diagnosis not present

## 2019-06-01 DIAGNOSIS — M25551 Pain in right hip: Secondary | ICD-10-CM | POA: Diagnosis not present

## 2019-06-01 DIAGNOSIS — Z96641 Presence of right artificial hip joint: Secondary | ICD-10-CM | POA: Diagnosis not present

## 2019-06-01 DIAGNOSIS — M6281 Muscle weakness (generalized): Secondary | ICD-10-CM | POA: Diagnosis not present

## 2019-06-01 DIAGNOSIS — M25552 Pain in left hip: Secondary | ICD-10-CM | POA: Diagnosis not present

## 2019-06-03 DIAGNOSIS — M25552 Pain in left hip: Secondary | ICD-10-CM | POA: Diagnosis not present

## 2019-06-03 DIAGNOSIS — M25651 Stiffness of right hip, not elsewhere classified: Secondary | ICD-10-CM | POA: Diagnosis not present

## 2019-06-03 DIAGNOSIS — M6281 Muscle weakness (generalized): Secondary | ICD-10-CM | POA: Diagnosis not present

## 2019-06-03 DIAGNOSIS — Z96641 Presence of right artificial hip joint: Secondary | ICD-10-CM | POA: Diagnosis not present

## 2019-06-03 DIAGNOSIS — R262 Difficulty in walking, not elsewhere classified: Secondary | ICD-10-CM | POA: Diagnosis not present

## 2019-06-03 DIAGNOSIS — M25551 Pain in right hip: Secondary | ICD-10-CM | POA: Diagnosis not present

## 2019-06-04 MED FILL — AMLODIPINE BESYLATE 10 MG T: 10 | 90 days supply | Qty: 90 | Fill #2

## 2019-06-08 ENCOUNTER — Ambulatory Visit: Payer: 59 | Admitting: Physician Assistant

## 2019-06-08 DIAGNOSIS — M25552 Pain in left hip: Secondary | ICD-10-CM | POA: Diagnosis not present

## 2019-06-08 DIAGNOSIS — M25651 Stiffness of right hip, not elsewhere classified: Secondary | ICD-10-CM | POA: Diagnosis not present

## 2019-06-08 DIAGNOSIS — Z96641 Presence of right artificial hip joint: Secondary | ICD-10-CM | POA: Diagnosis not present

## 2019-06-08 DIAGNOSIS — M25551 Pain in right hip: Secondary | ICD-10-CM | POA: Diagnosis not present

## 2019-06-08 DIAGNOSIS — M6281 Muscle weakness (generalized): Secondary | ICD-10-CM | POA: Diagnosis not present

## 2019-06-08 DIAGNOSIS — R262 Difficulty in walking, not elsewhere classified: Secondary | ICD-10-CM | POA: Diagnosis not present

## 2019-06-10 DIAGNOSIS — M25551 Pain in right hip: Secondary | ICD-10-CM | POA: Diagnosis not present

## 2019-06-10 DIAGNOSIS — R262 Difficulty in walking, not elsewhere classified: Secondary | ICD-10-CM | POA: Diagnosis not present

## 2019-06-10 DIAGNOSIS — M25552 Pain in left hip: Secondary | ICD-10-CM | POA: Diagnosis not present

## 2019-06-10 DIAGNOSIS — M25651 Stiffness of right hip, not elsewhere classified: Secondary | ICD-10-CM | POA: Diagnosis not present

## 2019-06-10 DIAGNOSIS — M6281 Muscle weakness (generalized): Secondary | ICD-10-CM | POA: Diagnosis not present

## 2019-06-10 DIAGNOSIS — Z96641 Presence of right artificial hip joint: Secondary | ICD-10-CM | POA: Diagnosis not present

## 2019-06-15 DIAGNOSIS — M25551 Pain in right hip: Secondary | ICD-10-CM | POA: Diagnosis not present

## 2019-06-15 DIAGNOSIS — M25651 Stiffness of right hip, not elsewhere classified: Secondary | ICD-10-CM | POA: Diagnosis not present

## 2019-06-15 DIAGNOSIS — Z96641 Presence of right artificial hip joint: Secondary | ICD-10-CM | POA: Diagnosis not present

## 2019-06-15 DIAGNOSIS — M25552 Pain in left hip: Secondary | ICD-10-CM | POA: Diagnosis not present

## 2019-06-15 DIAGNOSIS — R262 Difficulty in walking, not elsewhere classified: Secondary | ICD-10-CM | POA: Diagnosis not present

## 2019-06-15 DIAGNOSIS — M6281 Muscle weakness (generalized): Secondary | ICD-10-CM | POA: Diagnosis not present

## 2019-06-15 MED FILL — LISINOPRIL 40 MG TABLET: 40 | 90 days supply | Qty: 90 | Fill #1

## 2019-06-17 ENCOUNTER — Ambulatory Visit (INDEPENDENT_AMBULATORY_CARE_PROVIDER_SITE_OTHER): Payer: 59 | Admitting: Physical Medicine and Rehabilitation

## 2019-06-17 ENCOUNTER — Encounter: Payer: Self-pay | Admitting: Physical Medicine and Rehabilitation

## 2019-06-17 ENCOUNTER — Ambulatory Visit: Payer: Self-pay

## 2019-06-17 ENCOUNTER — Other Ambulatory Visit: Payer: Self-pay

## 2019-06-17 DIAGNOSIS — M25552 Pain in left hip: Secondary | ICD-10-CM

## 2019-06-17 NOTE — Progress Notes (Signed)
   Shelly Cohen - 59 y.o. female MRN [id number]  Date of birth: 1961-04-29  Office Visit Note: Visit Date: 06/17/2019 PCP: Flossie Buffy, NP Referred by: Flossie Buffy, NP  Subjective: Chief Complaint  Patient presents with  . Left Hip - Pain   HPI:  Shelly Cohen is a 58 y.o. female who comes in today At the request of Benita Stabile, PA-C for diagnostic medically therapeutic left anesthetic hip arthrogram.  She has had history of right total hip arthroplasty to severe arthritis of the right hip.  Imaging at that time showed some changes on the left.  Patient is having mostly lateral posterior lateral left hip pain but some groin pain.  ROS Otherwise per HPI.  Assessment & Plan: Visit Diagnoses:  1. Pain in left hip     Plan: Findings:  Patient did seem to have some relief during the anesthetic phase of the injection.    Meds & Orders: No orders of the defined types were placed in this encounter.   Orders Placed This Encounter  Procedures  . Large Joint Inj: L hip joint  . C-ARM NO Order    Follow-up: Return for Benita Stabile, PA-C as scheduled.   Procedures: Large Joint Inj: L hip joint on 06/17/2019 3:12 PM Indications: pain and diagnostic evaluation Details: 22 G needle, anterior approach  Arthrogram: Yes  Medications: 80 mg triamcinolone acetonide 40 MG/ML; 4 mL bupivacaine 0.25 % Outcome: tolerated well, no immediate complications  Arthrogram demonstrated excellent flow of contrast throughout the joint surface without extravasation or obvious defect.  The patient had relief of symptoms during the anesthetic phase of the injection.  Procedure, treatment alternatives, risks and benefits explained, specific risks discussed. Consent was given by the patient. Immediately prior to procedure a time out was called to verify the correct patient, procedure, equipment, support staff and site/side marked as required. Patient was prepped and draped in the usual  sterile fashion.      No notes on file   Clinical History: No specialty comments available.     Objective:  VS:  HT:    WT:   BMI:     BP:   HR: bpm  TEMP: ( )  RESP:  Physical Exam  Ortho Exam Imaging: No results found.

## 2019-06-17 NOTE — Progress Notes (Signed)
 ..  Numeric Pain Rating Scale and Functional Assessment Average Pain 4   In the last MONTH (on 0-10 scale) has pain interfered with the following?  1. General activity like being  able to carry out your everyday physical activities such as walking, climbing stairs, carrying groceries, or moving a chair?  Rating(5)   ,-Dye Allergies.

## 2019-06-24 MED ORDER — TRIAMCINOLONE ACETONIDE 40 MG/ML IJ SUSP
80.0000 mg | INTRAMUSCULAR | Status: AC | PRN
  Administered 2019-06-17: 80 mg via INTRA_ARTICULAR

## 2019-06-24 MED ORDER — BUPIVACAINE HCL 0.25 % IJ SOLN
4.0000 mL | INTRAMUSCULAR | Status: AC | PRN
  Administered 2019-06-17: 15:00:00 4 mL via INTRA_ARTICULAR

## 2019-07-01 ENCOUNTER — Ambulatory Visit: Payer: 59 | Admitting: Physician Assistant

## 2019-07-08 ENCOUNTER — Other Ambulatory Visit: Payer: Self-pay

## 2019-07-08 ENCOUNTER — Encounter: Payer: Self-pay | Admitting: Orthopaedic Surgery

## 2019-07-08 ENCOUNTER — Ambulatory Visit (INDEPENDENT_AMBULATORY_CARE_PROVIDER_SITE_OTHER): Payer: No Typology Code available for payment source | Admitting: Orthopaedic Surgery

## 2019-07-08 DIAGNOSIS — M7061 Trochanteric bursitis, right hip: Secondary | ICD-10-CM | POA: Diagnosis not present

## 2019-07-08 DIAGNOSIS — M25552 Pain in left hip: Secondary | ICD-10-CM | POA: Diagnosis not present

## 2019-07-08 DIAGNOSIS — Z96641 Presence of right artificial hip joint: Secondary | ICD-10-CM | POA: Diagnosis not present

## 2019-07-08 DIAGNOSIS — M25551 Pain in right hip: Secondary | ICD-10-CM | POA: Diagnosis not present

## 2019-07-08 MED ORDER — METHYLPREDNISOLONE ACETATE 40 MG/ML IJ SUSP
40.0000 mg | INTRAMUSCULAR | Status: AC | PRN
  Administered 2019-07-08: 40 mg via INTRA_ARTICULAR

## 2019-07-08 MED ORDER — LIDOCAINE HCL 1 % IJ SOLN
3.0000 mL | INTRAMUSCULAR | Status: AC | PRN
  Administered 2019-07-08: 3 mL

## 2019-07-08 NOTE — Progress Notes (Signed)
Office Visit Note   Patient: Shelly Cohen           Date of Birth: 23-Mar-1961           MRN: [id number] Visit Date: 07/08/2019              Requested by: Flossie Buffy, NP [address],  [place] [address] PCP: Flossie Buffy, NP   Assessment & Plan: Visit Diagnoses:  1. Status post total replacement of right hip   2. Bilateral hip pain   3. Trochanteric bursitis, right hip     Plan: Since she is still having trochanteric pain of her right hip 9 months out from her hip replacement, I do feel it is worthwhile trying a trochanteric injection with a steroid on the right side.  She agreed with this as well and tolerated the procedure.  At this point we do not need to see her back for 6 months unless things worsen in any way.  At that visit I would like a low AP pelvis and lateral of both hips.  Follow-Up Instructions: Return in about 6 months (around 01/05/2020).   Orders:  Orders Placed This Encounter  Procedures  . Large Joint Inj   No orders of the defined types were placed in this encounter.     Procedures: Large Joint Inj: R greater trochanter on 07/08/2019 2:42 PM Indications: pain and diagnostic evaluation Details: 22 G 1.5 in needle, lateral approach  Arthrogram: No  Medications: 3 mL lidocaine 1 %; 40 mg methylPREDNISolone acetate 40 MG/ML Outcome: tolerated well, no immediate complications Procedure, treatment alternatives, risks and benefits explained, specific risks discussed. Consent was given by the patient. Immediately prior to procedure a time out was called to verify the correct patient, procedure, equipment, support staff and site/side marked as required. Patient was prepped and draped in the usual sterile fashion.       Clinical Data: No additional findings.   Subjective: Chief Complaint  Patient presents with  . Left Hip - Follow-up  The patient is now 9 months out from a right total hip arthroplasty.  3 weeks ago we  had Dr. Ernestina Patches placed an intra-articular steroid injection in her left hip.  She had problems with rotating that hip and a lot of pain and needing a lift that hip.  Since that injection she has done very well.  She says that she can now lift her left hip easier and can rotate it easier and is not having significant pain.  She is still dealing with trochanteric pain on the right operative side.  She cannot sleep on that side at night.  She denies any leg length discrepancy and says overall she is doing great.  She is a very active 59 years old.  HPI  Review of Systems She currently denies any headache, chest pain, shortness of breath, fever, chills, nausea, vomiting.  Objective: Vital Signs: There were no vitals taken for this visit.  Physical Exam  Ortho Exam  Specialty Comments:  No specialty comments available.  Imaging: No results found.   PMFS History: Patient Active Problem List   Diagnosis Date Noted  . Status post total replacement of right hip 09/12/2018  . Obesity (BMI 30-39.9) 08/22/2018  . Trigger thumb, left thumb 05/07/2018  . OA (osteoarthritis) of hip 04/22/2018  . Open angle primary glaucoma 01/08/2018  . Thalassemia trait, alpha 06/29/2016  . Iron deficiency anemia 06/29/2016  . S/P bunionectomy 06/29/2016  . HTN (hypertension),  benign 05/18/2016  . Hypothyroidism 05/18/2016   Past Medical History:  Diagnosis Date  . Alpha thalassemia trait   . Fibroadenoma   . Glaucoma    monitored every 53months, right eye worse than left   . History of positive PPD, treatment status unknown   . Hypertension   . Thyroid disease     Family History  Problem Relation Age of Onset  . Hypertension Mother   . Anemia Mother   . Arthritis Mother        rheumatoid arthritis  . Kidney disease Mother   . Cataracts Mother   . Heart disease Mother   . Hypertension Sister   . Cataracts Sister   . Hypertension Brother   . Glaucoma Brother   . Diabetes Brother   . Cancer  Maternal Grandmother        leukemia  . Cancer Maternal Grandfather        lung  . Glaucoma Maternal Aunt     Past Surgical History:  Procedure Laterality Date  . ABDOMINAL HYSTERECTOMY  1989  . BREAST BIOPSY  2013  . BUNIONECTOMY  10/2014  . CHOLECYSTECTOMY  2017  . COLONOSCOPY    . GALLBLADDER SURGERY    . hallux limi    . LAPAROSCOPY    . TOTAL HIP ARTHROPLASTY Right 09/12/2018   Procedure: RIGHT TOTAL HIP ARTHROPLASTY ANTERIOR APPROACH;  Surgeon: Mcarthur Rossetti, MD;  Location: [facility];  Service: Orthopedics;  Laterality: Right;  . TUBAL LIGATION     Social History   Occupational History  . Not on file  Tobacco Use  . Smoking status: Never Smoker  . Smokeless tobacco: Never Used  Substance and Sexual Activity  . Alcohol use: No  . Drug use: No  . Sexual activity: Yes    Birth control/protection: Surgical

## 2019-07-20 ENCOUNTER — Encounter: Payer: Self-pay | Admitting: Family Medicine

## 2019-07-27 MED FILL — LEVOTHYROXINE 50 MCG TABLET: 50 | 90 days supply | Qty: 90 | Fill #1

## 2019-08-08 ENCOUNTER — Encounter: Payer: Self-pay | Admitting: Nurse Practitioner

## 2019-08-10 ENCOUNTER — Other Ambulatory Visit: Payer: Self-pay

## 2019-08-10 MED FILL — CARVEDILOL 25 MG TABLET: 25 | 90 days supply | Qty: 180 | Fill #2

## 2019-08-10 MED FILL — SPIRONOLACTONE 50 MG TABLET: 50 | 90 days supply | Qty: 90 | Fill #2

## 2019-08-11 ENCOUNTER — Encounter: Payer: Self-pay | Admitting: Nurse Practitioner

## 2019-08-11 ENCOUNTER — Ambulatory Visit (INDEPENDENT_AMBULATORY_CARE_PROVIDER_SITE_OTHER): Payer: No Typology Code available for payment source | Admitting: Nurse Practitioner

## 2019-08-11 VITALS — BP 132/78 | HR 71 | Temp 97.8°F | Ht 63.0 in | Wt 217.4 lb

## 2019-08-11 DIAGNOSIS — F5101 Primary insomnia: Secondary | ICD-10-CM

## 2019-08-11 DIAGNOSIS — I10 Essential (primary) hypertension: Secondary | ICD-10-CM | POA: Diagnosis not present

## 2019-08-11 DIAGNOSIS — R739 Hyperglycemia, unspecified: Secondary | ICD-10-CM

## 2019-08-11 DIAGNOSIS — E039 Hypothyroidism, unspecified: Secondary | ICD-10-CM | POA: Diagnosis not present

## 2019-08-11 DIAGNOSIS — Z23 Encounter for immunization: Secondary | ICD-10-CM | POA: Diagnosis not present

## 2019-08-11 DIAGNOSIS — M1611 Unilateral primary osteoarthritis, right hip: Secondary | ICD-10-CM

## 2019-08-11 DIAGNOSIS — Z96641 Presence of right artificial hip joint: Secondary | ICD-10-CM

## 2019-08-11 LAB — TSH: TSH: 2.96 u[IU]/mL (ref 0.35–4.50)

## 2019-08-11 LAB — BASIC METABOLIC PANEL
BUN: 13 mg/dL (ref 6–23)
CO2: 29 mEq/L (ref 19–32)
Calcium: 9.8 mg/dL (ref 8.4–10.5)
Chloride: 105 mEq/L (ref 96–112)
Creatinine, Ser: 0.82 mg/dL (ref 0.40–1.20)
GFR: 86.59 mL/min (ref >60.00)
Glucose, Bld: 107 mg/dL — ABNORMAL HIGH (ref 70–99)
Potassium: 4.4 mEq/L (ref 3.5–5.1)
Sodium: 141 mEq/L (ref 135–145)

## 2019-08-11 LAB — T4, FREE: Free T4: 0.9 ng/dL (ref 0.60–1.60)

## 2019-08-11 LAB — HEMOGLOBIN A1C: Hgb A1c MFr Bld: 6.5 % (ref 4.6–6.5)

## 2019-08-11 MED ORDER — MELOXICAM 15 MG PO TABS: 15.0000 mg | ORAL_TABLET | Freq: Every day | ORAL | 1 refills | Status: DC

## 2019-08-11 MED ORDER — TEMAZEPAM 7.5 MG PO CAPS: 7.5000 mg | ORAL_CAPSULE | Freq: Every evening | ORAL | 0 refills | Status: DC | PRN

## 2019-08-11 MED FILL — TEMAZEPAM 7.5 MG CAPSULE: 7.5 | 30 days supply | Qty: 30 | Fill #0

## 2019-08-11 MED FILL — MELOXICAM 15 MG TABLET: 15 | 15 days supply | Qty: 15 | Fill #0

## 2019-08-11 NOTE — Progress Notes (Signed)
Subjective:  Patient ID: Shelly Cohen, female    DOB: 05-05-1961  Age: 59 y.o. MRN: [id number]  CC: Follow-up (pt here for 6 month f/u on HTN and Hypothyroidism, no concerns. Need refills on medications.)  Insomnia Primary symptoms: fragmented sleep, difficulty falling asleep, somnolence, premature morning awakening, malaise/fatigue.  The current episode started more than one year. The onset quality is gradual. The problem occurs nightly. The problem has been gradually worsening since onset. The symptoms are aggravated by bed partner and pain. How many beverages per day that contain caffeine: 0 - 1.  Types of beverages you drink: coffee. Nothing relieves the symptoms. Past treatments include medication. The treatment provided no relief. Typical bedtime:  8-10 P.M..  PMH includes: hypertension, no depression, no family stress or anxiety, no restless leg syndrome, no work related stressors, chronic pain, no apnea. Prior diagnostic workup includes:  Blood work.  unable to tolerate Ambien and benadryl  Needs referral to cardiology and ortho due to new insurance plan.   HTN:  BP at goal with amlodipine, coreg, lisinopril and spironolactone. BP Readings from Last 3 Encounters:  08/11/19 132/78  02/06/19 (!) 150/80  10/30/18 127/80   Hypothyroidism: Weight gain noted, she reports inability to exercise due to ongoing hip pain despite joint injection and arthroplasty. Denies any polyphagia or hair loss or skin changes or fatigue Wt Readings from Last 3 Encounters:  08/11/19 217 lb 6.4 oz (98.6 kg)  03/23/19 205 lb (93 kg)  02/06/19 205 lb 6.4 oz (93.2 kg)   Reviewed past Medical, Social and Family history today.  Outpatient Medications Prior to Visit  Medication Sig Dispense Refill  . amLODipine (NORVASC) 10 MG tablet Take 1 tablet (10 mg total) by mouth daily. 90 tablet 3  . Ascorbic Acid (VITAMIN C PO) Take 1 tablet by mouth daily.     . carvedilol (COREG) 25 MG tablet Take 1  tablet (25 mg total) by mouth 2 (two) times daily. 180 tablet 3  . docusate sodium (COLACE) 100 MG capsule Take 1 capsule (100 mg total) by mouth 2 (two) times daily. (Patient taking differently: Take 100 mg by mouth 2 (two) times daily as needed for moderate constipation. ) 10 capsule 0  . Latanoprostene Bunod (VYZULTA) 0.024 % SOLN Apply 1 drop to eye 2 (two) times daily.    . Multiple Vitamin (MULTI-VITAMINS) TABS Take 1 tablet by mouth daily.     . Omega-3 Fatty Acids (FISH OIL PO) Take 1 capsule by mouth daily.     Marland Kitchen spironolactone (ALDACTONE) 50 MG tablet Take 1 tablet (50 mg total) by mouth daily. 90 tablet 2  . levothyroxine (SYNTHROID) 50 MCG tablet Take 1 tablet (50 mcg total) by mouth daily before breakfast. 90 tablet 1  . lisinopril (ZESTRIL) 40 MG tablet TAKE 1 TABLET BY MOUTH ONCE A DAY 90 tablet 1  . amoxicillin (AMOXIL) 500 MG tablet Take 2 tabs by mouth one hour before dental cleaning and 2 tabs by mouth six hours after (Patient not taking: Reported on 08/11/2019) 8 tablet 0   No facility-administered medications prior to visit.    ROS See HPI  Objective:  BP 132/78   Pulse 71   Temp 97.8 F (36.6 C) (Oral)   Ht 5\' 3"  (1.6 m)   Wt 217 lb 6.4 oz (98.6 kg)   SpO2 98%   BMI 38.51 kg/m   BP Readings from Last 3 Encounters:  08/11/19 132/78  02/06/19 (!) 150/80  10/30/18 127/80  Wt Readings from Last 3 Encounters:  08/11/19 217 lb 6.4 oz (98.6 kg)  03/23/19 205 lb (93 kg)  02/06/19 205 lb 6.4 oz (93.2 kg)    Physical Exam Vitals reviewed.  Constitutional:      Appearance: She is obese.  Cardiovascular:     Rate and Rhythm: Normal rate and regular rhythm.     Pulses: Normal pulses.     Heart sounds: Normal heart sounds.  Pulmonary:     Effort: Pulmonary effort is normal.     Breath sounds: Normal breath sounds.  Musculoskeletal:     Right lower leg: No edema.     Left lower leg: No edema.  Neurological:     Mental Status: She is alert and oriented to  person, place, and time.  Psychiatric:        Mood and Affect: Mood normal.        Behavior: Behavior normal.        Thought Content: Thought content normal.    Lab Results  Component Value Date   WBC 9.8 02/06/2019   HGB 11.7 (L) 02/06/2019   HCT 37.1 02/06/2019   PLT 327.0 02/06/2019   GLUCOSE 107 (H) 08/11/2019   CHOL 186 02/06/2019   TRIG 55.0 02/06/2019   HDL 69.50 02/06/2019   LDLCALC 105 (H) 02/06/2019   ALT 11 02/06/2019   AST 14 02/06/2019   NA 141 08/11/2019   K 4.4 08/11/2019   CL 105 08/11/2019   CREATININE 0.82 08/11/2019   BUN 13 08/11/2019   CO2 29 08/11/2019   TSH 2.96 08/11/2019   HGBA1C 6.5 08/11/2019    Assessment & Plan:  This visit occurred during the SARS-CoV-2 public health emergency.  Safety protocols were in place, including screening questions prior to the visit, additional usage of staff PPE, and extensive cleaning of exam room while observing appropriate contact time as indicated for disinfecting solutions.   Shelly Cohen was seen today for follow-up.  Diagnoses and all orders for this visit:  HTN (hypertension), benign -     Basic metabolic panel -     Ambulatory referral to Cardiology -     lisinopril (ZESTRIL) 40 MG tablet; Take 1 tablet (40 mg total) by mouth daily.  Hypothyroidism, unspecified type -     TSH -     T4, free -     levothyroxine (SYNTHROID) 50 MCG tablet; Take 1 tablet (50 mcg total) by mouth daily before breakfast.  Primary insomnia -     temazepam (RESTORIL) 7.5 MG capsule; Take 1 capsule (7.5 mg total) by mouth at bedtime as needed for sleep.  Hyperglycemia -     Hemoglobin A1c  Primary osteoarthritis of right hip -     meloxicam (MOBIC) 15 MG tablet; Take 1 tablet (15 mg total) by mouth daily. -     AMB referral to orthopedics  Status post total replacement of right hip -     AMB referral to orthopedics  Need for shingles vaccine -     Varicella-zoster vaccine IM (Shingrix)   I have changed Shelly  Cohen's lisinopril. I am also having her start on temazepam and meloxicam. Additionally, I am having her maintain her Multi-Vitamins, Omega-3 Fatty Acids (FISH OIL PO), Latanoprostene Bunod, Ascorbic Acid (VITAMIN C PO), amLODipine, docusate sodium, carvedilol, spironolactone, amoxicillin, and levothyroxine.  Meds ordered this encounter  Medications  . temazepam (RESTORIL) 7.5 MG capsule    Sig: Take 1 capsule (7.5 mg total) by mouth at bedtime as  needed for sleep.    Dispense:  30 capsule    Refill:  0    Order Specific Question:   Supervising Provider    Answer:   Lucille Passy [[id number]]  . meloxicam (MOBIC) 15 MG tablet    Sig: Take 1 tablet (15 mg total) by mouth daily.    Dispense:  15 tablet    Refill:  1    Order Specific Question:   Supervising Provider    Answer:   Lucille Passy [[id number]]  . levothyroxine (SYNTHROID) 50 MCG tablet    Sig: Take 1 tablet (50 mcg total) by mouth daily before breakfast.    Dispense:  90 tablet    Refill:  1    Order Specific Question:   Supervising Provider    Answer:   Lucille Passy [[id number]]  . lisinopril (ZESTRIL) 40 MG tablet    Sig: Take 1 tablet (40 mg total) by mouth daily.    Dispense:  90 tablet    Refill:  3    Order Specific Question:   Supervising Provider    Answer:   Lucille Passy [[id number]]    Problem List Items Addressed This Visit      Cardiovascular and Mediastinum   HTN (hypertension), benign - Primary    BP at goal with amlodipine, coreg, lisinopril and spironolactone. Normal electrolytes and renal function Continue current medications. BP Readings from Last 3 Encounters:  08/11/19 132/78  02/06/19 (!) 150/80  10/30/18 127/80        Relevant Medications   lisinopril (ZESTRIL) 40 MG tablet   Other Relevant Orders   Basic metabolic panel (Completed)   Ambulatory referral to Cardiology     Endocrine   Hypothyroidism    Stable TSh and t4 Continue levothyroxine 67mcg F/up 46months      Relevant Medications    levothyroxine (SYNTHROID) 50 MCG tablet   Other Relevant Orders   TSH (Completed)   T4, free (Completed)     Musculoskeletal and Integument   OA (osteoarthritis) of hip    S/p R. Arthroplasty. Persistent pain, interferes with exercise regimen and sleep. Minimal improvement with tylenol and muscle relaxant.  Start meloxicam daily with food      Relevant Medications   meloxicam (MOBIC) 15 MG tablet   Other Relevant Orders   AMB referral to orthopedics     Other   Hyperglycemia   Relevant Orders   Hemoglobin A1c (Completed)   Primary insomnia   Relevant Medications   temazepam (RESTORIL) 7.5 MG capsule   Status post total replacement of right hip   Relevant Orders   AMB referral to orthopedics    Other Visit Diagnoses    Need for shingles vaccine       Relevant Orders   Varicella-zoster vaccine IM (Shingrix) (Completed)      Follow-up: Return in about 6 months (around 02/08/2020) for CPE (fasting).  Wilfred Lacy, NP

## 2019-08-11 NOTE — Patient Instructions (Addendum)
Ok to take shingles vaccine.  Stable lab results with hgbA1c at 6.5. you are consider diabetic  at this time. It is imperative to maintain a low sugar/low carb diet since exercise is limited at this time. F/up in 46months Please inquire if she completed sleep study ordered by cardiology back in February?

## 2019-08-13 ENCOUNTER — Encounter: Payer: Self-pay | Admitting: Nurse Practitioner

## 2019-08-13 ENCOUNTER — Telehealth: Payer: Self-pay | Admitting: Nurse Practitioner

## 2019-08-13 ENCOUNTER — Other Ambulatory Visit: Payer: Self-pay | Admitting: Nurse Practitioner

## 2019-08-13 DIAGNOSIS — F5101 Primary insomnia: Secondary | ICD-10-CM | POA: Insufficient documentation

## 2019-08-13 DIAGNOSIS — R7303 Prediabetes: Secondary | ICD-10-CM | POA: Insufficient documentation

## 2019-08-13 DIAGNOSIS — R739 Hyperglycemia, unspecified: Secondary | ICD-10-CM | POA: Insufficient documentation

## 2019-08-13 DIAGNOSIS — E119 Type 2 diabetes mellitus without complications: Secondary | ICD-10-CM | POA: Insufficient documentation

## 2019-08-13 MED ORDER — LEVOTHYROXINE SODIUM 50 MCG PO TABS: 50.0000 ug | ORAL_TABLET | Freq: Every day | ORAL | 1 refills | Status: DC

## 2019-08-13 MED ORDER — LISINOPRIL 40 MG PO TABS: 40.0000 mg | ORAL_TABLET | Freq: Every day | ORAL | 3 refills | Status: DC

## 2019-08-13 NOTE — Assessment & Plan Note (Signed)
Stable TSh and t4 Continue levothyroxine 50mcg F/up 68months

## 2019-08-13 NOTE — Assessment & Plan Note (Signed)
S/p R. Arthroplasty. Persistent pain, interferes with exercise regimen and sleep. Minimal improvement with tylenol and muscle relaxant.  Start meloxicam daily with food

## 2019-08-13 NOTE — Telephone Encounter (Signed)
Pt stated she never completed. Pt stated she has not use any sleep aid/hypnotic yet (not going to do it).

## 2019-08-13 NOTE — Telephone Encounter (Signed)
Please inquire from Ms, Shelly Cohen if she ever completed a sleep study. If so, when was that and where was it done? If sleep study was not done, use of a sleep aid/hypnotic is consider high risk for respiratory suppression.

## 2019-08-13 NOTE — Assessment & Plan Note (Signed)
BP at goal with amlodipine, coreg, lisinopril and spironolactone. Normal electrolytes and renal function Continue current medications. BP Readings from Last 3 Encounters:  08/11/19 132/78  02/06/19 (!) 150/80  10/30/18 127/80

## 2019-08-17 ENCOUNTER — Encounter: Payer: Self-pay | Admitting: Nurse Practitioner

## 2019-08-24 ENCOUNTER — Encounter: Payer: Self-pay | Admitting: Nurse Practitioner

## 2019-09-03 NOTE — Progress Notes (Signed)
Cardiology Office Note:    Date:  09/04/2019   ID:  Shelly Cohen, DOB 22-Nov-1960, MRN [id number]  PCP:  Flossie Buffy, NP  Cardiologist:  Fransico Him, MD    Referring MD: Flossie Buffy, NP   Chief Complaint  Patient presents with  . Hypertension    History of Present Illness:    Shelly Cohen is a 59 y.o. female with a hx of HTN and thyroid disease who was referred by her PCP for evaluation of poorly controlled HTN.  She was seen in the ER 07/18/2018 for uncontrolled HTN and HA.  She had been on Lisinopril 20mg  daily, amlodipine 2.5mg  daily and Toprol XL 100mg  daily.  She did not tolerate diuretics in the past due to hypokalemia.  Renal artery duplex was normal. Aldosterone and Aldo PRA ratio were normal.  Plasma catecholamine levels were normal but no urine catecholamines were done.  It was felt that she had essential hypertension.  2D echo showed normal LVF with EF 60-65% with mild LVH.    She is here today for followup and is doing well.  She denies any chest pain or pressure, SOB, DOE, PND, orthopnea, LE edema, dizziness, palpitations or syncope. She is compliant with her meds and is tolerating meds with no SE.  She was supposed to have a sleep study but it was never done.   Past Medical History:  Diagnosis Date  . Alpha thalassemia trait   . Fibroadenoma   . Glaucoma    monitored every 70months, right eye worse than left   . History of positive PPD, treatment status unknown   . Hypertension   . Thyroid disease     Past Surgical History:  Procedure Laterality Date  . ABDOMINAL HYSTERECTOMY  1989  . BREAST BIOPSY  2013  . BUNIONECTOMY  10/2014  . CHOLECYSTECTOMY  2017  . COLONOSCOPY    . GALLBLADDER SURGERY    . hallux limi    . LAPAROSCOPY    . TOTAL HIP ARTHROPLASTY Right 09/12/2018   Procedure: RIGHT TOTAL HIP ARTHROPLASTY ANTERIOR APPROACH;  Surgeon: Mcarthur Rossetti, MD;  Location: [facility];  Service: Orthopedics;  Laterality: Right;  .  TUBAL LIGATION      Current Medications: Current Meds  Medication Sig  . amLODipine (NORVASC) 10 MG tablet Take 1 tablet (10 mg total) by mouth daily.  . Ascorbic Acid (VITAMIN C PO) Take 1 tablet by mouth daily.   Marland Kitchen docusate sodium (COLACE) 100 MG capsule Take 1 capsule (100 mg total) by mouth 2 (two) times daily. (Patient taking differently: Take 100 mg by mouth 2 (two) times daily as needed for moderate constipation. )  . Latanoprostene Bunod (VYZULTA) 0.024 % SOLN Apply 1 drop to eye 2 (two) times daily.  Marland Kitchen levothyroxine (SYNTHROID) 50 MCG tablet Take 1 tablet (50 mcg total) by mouth daily before breakfast.  . lisinopril (ZESTRIL) 40 MG tablet Take 1 tablet (40 mg total) by mouth daily.  . meloxicam (MOBIC) 15 MG tablet Take 1 tablet (15 mg total) by mouth daily.  . Multiple Vitamin (MULTI-VITAMINS) TABS Take 1 tablet by mouth daily.   . Omega-3 Fatty Acids (FISH OIL PO) Take 1 capsule by mouth daily.   Marland Kitchen spironolactone (ALDACTONE) 50 MG tablet Take 1 tablet (50 mg total) by mouth daily.  . temazepam (RESTORIL) 7.5 MG capsule Take 1 capsule (7.5 mg total) by mouth at bedtime as needed for sleep.     Allergies:   Diphenhydramine hcl, Hydrocodone, Oxycodone,  Azithromycin, Cephalexin, Penicillin g, and Zolpidem   Social History   Socioeconomic History  . Marital status: Married    Spouse name: Not on file  . Number of children: Not on file  . Years of education: Not on file  . Highest education level: Not on file  Occupational History  . Not on file  Tobacco Use  . Smoking status: Never Smoker  . Smokeless tobacco: Never Used  Substance and Sexual Activity  . Alcohol use: No  . Drug use: No  . Sexual activity: Yes    Birth control/protection: Surgical  Other Topics Concern  . Not on file  Social History Narrative  . Not on file   Social Determinants of Health   Financial Resource Strain:   . Difficulty of Paying Living Expenses: Not on file  Food Insecurity:   .  Worried About Charity fundraiser in the Last Year: Not on file  . Ran Out of Food in the Last Year: Not on file  Transportation Needs:   . Lack of Transportation (Medical): Not on file  . Lack of Transportation (Non-Medical): Not on file  Physical Activity:   . Days of Exercise per Week: Not on file  . Minutes of Exercise per Session: Not on file  Stress:   . Feeling of Stress : Not on file  Social Connections:   . Frequency of Communication with Friends and Family: Not on file  . Frequency of Social Gatherings with Friends and Family: Not on file  . Attends Religious Services: Not on file  . Active Member of Clubs or Organizations: Not on file  . Attends Archivist Meetings: Not on file  . Marital Status: Not on file     Family History: The patient's family history includes Anemia in her mother; Arthritis in her mother; Cancer in her maternal grandfather and maternal grandmother; Cataracts in her mother and sister; Diabetes in her brother; Glaucoma in her brother and maternal aunt; Heart disease in her mother; Hypertension in her brother, mother, and sister; Kidney disease in her mother.  ROS:   Please see the history of present illness.    ROS  All other systems reviewed and negative.   EKGs/Labs/Other Studies Reviewed:    The following studies were reviewed today: 2D Echo  EKG:  EKG is  ordered today.  The ekg ordered today demonstrates NSR with no ST changes  Recent Labs: 02/06/2019: ALT 11; Hemoglobin 11.7; Platelets 327.0 08/11/2019: BUN 13; Creatinine, Ser 0.82; Potassium 4.4; Sodium 141; TSH 2.96   Recent Lipid Panel    Component Value Date/Time   CHOL 186 02/06/2019 0845   TRIG 55.0 02/06/2019 0845   HDL 69.50 02/06/2019 0845   CHOLHDL 3 02/06/2019 0845   VLDL 11.0 02/06/2019 0845   LDLCALC 105 (H) 02/06/2019 0845    Physical Exam:    VS:  BP 134/68   Pulse 68   Ht 5\' 3"  (1.6 m)   Wt 215 lb 6.4 oz (97.7 kg)   BMI 38.16 kg/m     Wt Readings  from Last 3 Encounters:  09/04/19 215 lb 6.4 oz (97.7 kg)  08/11/19 217 lb 6.4 oz (98.6 kg)  03/23/19 205 lb (93 kg)     GEN:  Well nourished, well developed in no acute distress HEENT: Normal NECK: No JVD; No carotid bruits LYMPHATICS: No lymphadenopathy CARDIAC: RRR, no murmurs, rubs, gallops RESPIRATORY:  Clear to auscultation without rales, wheezing or rhonchi  ABDOMEN: Soft, non-tender, non-distended  MUSCULOSKELETAL:  No edema; No deformity  SKIN: Warm and dry NEUROLOGIC:  Alert and oriented x 3 PSYCHIATRIC:  Normal affect   ASSESSMENT:    1. HTN (hypertension), benign   2. Obesity (BMI 30-39.9)    PLAN:    In order of problems listed above:  1.  HTN -BP controlled on exam today -continue amlodipine 10mg  daily, Carvedilol 25mg  BID, Lisinopril 40mg  daily and spiro 50mg  daily.  -labs reviewed and showed creatinine 0.82 and K+ 4.4 in Feb 2021. -will get home sleep study  2.  Obesity -I have encouraged her to get into a routine exercise program and cut back on carbs and portions.    Medication Adjustments/Labs and Tests Ordered: Current medicines are reviewed at length with the patient today.  Concerns regarding medicines are outlined above.  Orders Placed This Encounter  Procedures  . EKG 12-Lead   No orders of the defined types were placed in this encounter.   Signed, Fransico Him, MD  09/04/2019 8:17 AM    [place]

## 2019-09-04 ENCOUNTER — Encounter: Payer: Self-pay | Admitting: Cardiology

## 2019-09-04 ENCOUNTER — Other Ambulatory Visit: Payer: Self-pay

## 2019-09-04 ENCOUNTER — Telehealth: Payer: Self-pay | Admitting: *Deleted

## 2019-09-04 ENCOUNTER — Ambulatory Visit (INDEPENDENT_AMBULATORY_CARE_PROVIDER_SITE_OTHER): Payer: No Typology Code available for payment source | Admitting: Cardiology

## 2019-09-04 VITALS — BP 134/68 | HR 68 | Ht 63.0 in | Wt 215.4 lb

## 2019-09-04 DIAGNOSIS — I10 Essential (primary) hypertension: Secondary | ICD-10-CM | POA: Diagnosis not present

## 2019-09-04 DIAGNOSIS — E669 Obesity, unspecified: Secondary | ICD-10-CM | POA: Diagnosis not present

## 2019-09-04 NOTE — Telephone Encounter (Signed)
-----   Message from Antonieta Iba, RN sent at 09/04/2019  8:21 AM EST ----- Regarding: Sleep Study Home sleep study has been ordered for this patient.  Thanks,  [employer]

## 2019-09-04 NOTE — Patient Instructions (Signed)
Medication Instructions:  Your physician recommends that you continue on your current medications as directed. Please refer to the Current Medication list given to you today.  *If you need a refill on your cardiac medications before your next appointment, please call your pharmacy*  Testing/Procedures: Your physician has recommended that you have a sleep study. This test records several body functions during sleep, including: brain activity, eye movement, oxygen and carbon dioxide blood levels, heart rate and rhythm, breathing rate and rhythm, the flow of air through your mouth and nose, snoring, body muscle movements, and chest and belly movement.  Follow-Up: At [facility], you and your health needs are our priority.  As part of our continuing mission to provide you with exceptional heart care, we have created designated Provider Care Teams.  These Care Teams include your primary Cardiologist (physician) and Advanced Practice Providers (APPs -  Physician Assistants and Nurse Practitioners) who all work together to provide you with the care you need, when you need it.  We recommend signing up for the patient portal called "MyChart".  Sign up information is provided on this After Visit Summary.  MyChart is used to connect with patients for Virtual Visits (Telemedicine).  Patients are able to view lab/test results, encounter notes, upcoming appointments, etc.  Non-urgent messages can be sent to your provider as well.   To learn more about what you can do with MyChart, go to [email or web address].    Your next appointment:   1 year(s)  The format for your next appointment:   In Person  Provider:   Fransico Him, MD

## 2019-09-09 ENCOUNTER — Telehealth: Payer: Self-pay | Admitting: *Deleted

## 2019-09-09 NOTE — Telephone Encounter (Signed)
-----   Message from Antonieta Iba, RN sent at 09/04/2019  8:21 AM EST ----- Regarding: Sleep Study Home sleep study has been ordered for this patient.  Thanks,  [employer]

## 2019-09-09 NOTE — Telephone Encounter (Signed)
I called patient's insurance ([employer]) to get PA for HST. Per Jenny Reichmann patient does not have a referral to Dr Radford Pax on file. In order to proceed with the sleep study PA she will need to have one. Staff message sent to Gae Bon to contact the patient to inform she will need to get a referral from her PCP. The  current OV she had with Dr Radford Pax will be filed as "Non Coordinated." call reference # [id number].

## 2019-09-10 ENCOUNTER — Encounter: Payer: Self-pay | Admitting: Nurse Practitioner

## 2019-09-10 DIAGNOSIS — I10 Essential (primary) hypertension: Secondary | ICD-10-CM

## 2019-09-10 DIAGNOSIS — E669 Obesity, unspecified: Secondary | ICD-10-CM

## 2019-09-11 MED FILL — MELOXICAM 15 MG TABLET: 15 | 15 days supply | Qty: 15 | Fill #1

## 2019-09-17 ENCOUNTER — Other Ambulatory Visit: Payer: Self-pay | Admitting: Family Medicine

## 2019-09-17 DIAGNOSIS — I10 Essential (primary) hypertension: Secondary | ICD-10-CM

## 2019-09-17 MED FILL — LISINOPRIL 40 MG TABLET: 40 | 90 days supply | Qty: 90 | Fill #0

## 2019-09-17 MED FILL — AMLODIPINE BESYLATE 10 MG T: 10 | 90 days supply | Qty: 90 | Fill #0

## 2019-09-17 NOTE — Telephone Encounter (Signed)
Last OV 08/11/19 Last fill 07/21/18  #90/3

## 2019-09-25 NOTE — Telephone Encounter (Signed)
Lauralee Evener, CMA  Freada Bergeron, [place]  Per Jenny Reichmann with patient's insurance plan patient will need to have a referral on file before scheduling. Have member to call her health plan. Her previous office visit with Dr Radford Pax does not show that she has a referral. Once this has been done I can call back for PA.

## 2019-09-25 NOTE — Telephone Encounter (Signed)
Reached out to patient today to ask if she had gotten a referral yet for her sleep study. I lmtcb when she is ready to move forward with her sleep study.

## 2019-09-29 ENCOUNTER — Encounter: Payer: Self-pay | Admitting: Nurse Practitioner

## 2019-10-07 ENCOUNTER — Telehealth: Payer: Self-pay | Admitting: Nurse Practitioner

## 2019-10-07 DIAGNOSIS — R600 Localized edema: Secondary | ICD-10-CM

## 2019-10-07 DIAGNOSIS — E669 Obesity, unspecified: Secondary | ICD-10-CM

## 2019-10-07 DIAGNOSIS — I10 Essential (primary) hypertension: Secondary | ICD-10-CM

## 2019-10-07 DIAGNOSIS — F5101 Primary insomnia: Secondary | ICD-10-CM

## 2019-10-07 NOTE — Telephone Encounter (Signed)
-----   Message from Margot Ables sent at 10/07/2019 10:29 AM EDT ----- Regarding: [place], I talked to [facility] since they haven't pulled the order from their workqueue for the Home Sleep Test. They state they need a referral for pulmonary to do a consult first. Thank you.  Drue Dun

## 2019-10-07 NOTE — Telephone Encounter (Signed)
Referral entered  

## 2019-10-13 ENCOUNTER — Encounter: Payer: Self-pay | Admitting: Nurse Practitioner

## 2019-10-20 ENCOUNTER — Ambulatory Visit (INDEPENDENT_AMBULATORY_CARE_PROVIDER_SITE_OTHER): Payer: No Typology Code available for payment source

## 2019-10-20 ENCOUNTER — Encounter: Payer: Self-pay | Admitting: Nurse Practitioner

## 2019-10-20 ENCOUNTER — Other Ambulatory Visit: Payer: Self-pay

## 2019-10-20 DIAGNOSIS — Z23 Encounter for immunization: Secondary | ICD-10-CM | POA: Diagnosis not present

## 2019-10-20 DIAGNOSIS — M542 Cervicalgia: Secondary | ICD-10-CM

## 2019-10-20 MED ORDER — METHOCARBAMOL 750 MG PO TABS: 750.0000 mg | ORAL_TABLET | Freq: Every day | ORAL | 0 refills | Status: DC | PRN

## 2019-10-20 MED FILL — METHOCARBAMOL 750 MG TABS: 750 | 5 days supply | Qty: 5 | Fill #0

## 2019-10-20 NOTE — Progress Notes (Signed)
Pt came into the office to get 2nd shingle injection. Gave injection into the left deltoid, pt tolerated injection well. Gave information sheet to the pt.

## 2019-10-23 ENCOUNTER — Telehealth (INDEPENDENT_AMBULATORY_CARE_PROVIDER_SITE_OTHER): Payer: No Typology Code available for payment source | Admitting: Nurse Practitioner

## 2019-10-23 ENCOUNTER — Other Ambulatory Visit: Payer: Self-pay

## 2019-10-23 ENCOUNTER — Encounter: Payer: Self-pay | Admitting: Nurse Practitioner

## 2019-10-23 VITALS — BP 129/79 | HR 81 | Temp 97.6°F | Ht 63.0 in | Wt 212.0 lb

## 2019-10-23 DIAGNOSIS — M5413 Radiculopathy, cervicothoracic region: Secondary | ICD-10-CM | POA: Diagnosis not present

## 2019-10-23 DIAGNOSIS — M542 Cervicalgia: Secondary | ICD-10-CM | POA: Diagnosis not present

## 2019-10-23 MED ORDER — PREDNISONE 20 MG PO TABS: 40.0000 mg | ORAL_TABLET | Freq: Every day | ORAL | 0 refills | Status: DC

## 2019-10-23 MED ORDER — TRAMADOL HCL 50 MG PO TABS: 50.0000 mg | ORAL_TABLET | Freq: Three times a day (TID) | ORAL | 0 refills | Status: AC | PRN

## 2019-10-23 MED ORDER — METHOCARBAMOL 750 MG PO TABS: 750.0000 mg | ORAL_TABLET | Freq: Three times a day (TID) | ORAL | 0 refills | Status: DC | PRN

## 2019-10-23 MED FILL — traMADol HCL 50 MG TABS: 50 | 5 days supply | Qty: 15 | Fill #0

## 2019-10-23 MED FILL — predniSONE 20 MG TABS: 20 | 3 days supply | Qty: 6 | Fill #0

## 2019-10-23 MED FILL — METHOCARBAMOL 750 MG TABS: 750 | 7 days supply | Qty: 21 | Fill #0

## 2019-10-23 NOTE — Patient Instructions (Signed)
Go to ED if symptoms worsen. Avoid any NSAID while taking oral prednisone Alternated between tylenol and tramadol every 4hrs prn Alternate between warm and cold compress as needed.

## 2019-10-23 NOTE — Progress Notes (Signed)
Virtual Visit via Video Note  I connected with@ on 10/23/19 at 12:30 PM EDT by a video enabled telemedicine application and verified that I am speaking with the correct person using two identifiers.  Location: Patient:Home Provider: Office Participants: patient and provider  I discussed the limitations of evaluation and management by telemedicine and the availability of in person appointments. I also discussed with the patient that there may be a patient responsible charge related to this service. The patient expressed understanding and agreed to proceed.  CC:pt c/o of left upper back painful,spasm/going toward left side neck and right arm area/going on 1 wk/used tylenol and pain cream otc/ FYI Please put a note for [facility] pharmacy do not mail rx pls.   History of Present Illness: Neck Pain  This is a new problem. The current episode started in the past 7 days. The problem occurs constantly. The problem has been unchanged. The pain is associated with a sleep position. The pain is present in the right side. The quality of the pain is described as cramping and aching. The pain is severe. The symptoms are aggravated by twisting. The pain is worse during the day. Pertinent negatives include no chest pain, fever, headaches, leg pain, numbness, pain with swallowing, paresis, photophobia, syncope, tingling, trouble swallowing, visual change, weakness or weight loss. She has tried acetaminophen, bed rest and muscle relaxants for the symptoms. The treatment provided mild relief.  denies any chest pain or SOB or pleuritic pain or dizziness or diaphoresis.  Observations/Objective: Physical Exam  Constitutional: She is oriented to person, place, and time. No distress.  Neck:  Guarded neck ROM  Pulmonary/Chest: Effort normal.  Neurological: She is alert and oriented to person, place, and time.  Skin: No rash noted.  Psychiatric: She has a normal mood and affect. Her behavior is normal. Thought content  normal.  Vitals reviewed.  Assessment and Plan: Adaeze was seen today for back pain.  Diagnoses and all orders for this visit:  Radiculopathy of cervicothoracic region -     predniSONE (DELTASONE) 20 MG tablet; Take 2 tablets (40 mg total) by mouth daily with breakfast. -     methocarbamol (ROBAXIN-750) 750 MG tablet; Take 1 tablet (750 mg total) by mouth every 8 (eight) hours as needed for muscle spasms. -     traMADol (ULTRAM) 50 MG tablet; Take 1 tablet (50 mg total) by mouth every 8 (eight) hours as needed for up to 5 days for severe pain.  Neck pain, musculoskeletal -     predniSONE (DELTASONE) 20 MG tablet; Take 2 tablets (40 mg total) by mouth daily with breakfast. -     methocarbamol (ROBAXIN-750) 750 MG tablet; Take 1 tablet (750 mg total) by mouth every 8 (eight) hours as needed for muscle spasms. -     traMADol (ULTRAM) 50 MG tablet; Take 1 tablet (50 mg total) by mouth every 8 (eight) hours as needed for up to 5 days for severe pain.   Follow Up Instructions: Go to ED if symptoms worsen. Avoid any NSAID while taking oral prednisone Alternated between tylenol and tramadol every 4hrs prn Alternate between warm and cold compress as needed.   I discussed the assessment and treatment plan with the patient. The patient was provided an opportunity to ask questions and all were answered. The patient agreed with the plan and demonstrated an understanding of the instructions.   The patient was advised to call back or seek an in-person evaluation if the symptoms worsen or if  the condition fails to improve as anticipated.   Wilfred Lacy, NP

## 2019-10-27 ENCOUNTER — Encounter: Payer: Self-pay | Admitting: Nurse Practitioner

## 2019-10-27 DIAGNOSIS — M542 Cervicalgia: Secondary | ICD-10-CM

## 2019-10-27 DIAGNOSIS — M5413 Radiculopathy, cervicothoracic region: Secondary | ICD-10-CM

## 2019-10-28 ENCOUNTER — Encounter: Payer: Self-pay | Admitting: Nurse Practitioner

## 2019-10-28 MED ORDER — METHYLPREDNISOLONE 4 MG PO TBPK: ORAL_TABLET | ORAL | 0 refills | Status: DC

## 2019-10-28 MED FILL — METHYLPREDNISOLONE 4 MG TBP: 4 | 6 days supply | Qty: 21 | Fill #0

## 2019-11-02 ENCOUNTER — Other Ambulatory Visit: Payer: Self-pay | Admitting: Nurse Practitioner

## 2019-11-02 DIAGNOSIS — M1611 Unilateral primary osteoarthritis, right hip: Secondary | ICD-10-CM

## 2019-11-02 MED FILL — LEVOTHYROXINE 50 MCG TABLET: 50 | 90 days supply | Qty: 90 | Fill #0

## 2019-11-04 ENCOUNTER — Other Ambulatory Visit: Payer: Self-pay

## 2019-11-04 ENCOUNTER — Encounter: Payer: Self-pay | Admitting: Pulmonary Disease

## 2019-11-04 ENCOUNTER — Ambulatory Visit (INDEPENDENT_AMBULATORY_CARE_PROVIDER_SITE_OTHER): Payer: No Typology Code available for payment source | Admitting: Pulmonary Disease

## 2019-11-04 VITALS — BP 116/68 | HR 77 | Temp 97.0°F | Ht 66.0 in | Wt 194.0 lb

## 2019-11-04 DIAGNOSIS — I10 Essential (primary) hypertension: Secondary | ICD-10-CM

## 2019-11-04 MED ORDER — ESZOPICLONE 2 MG PO TABS: 2.0000 mg | ORAL_TABLET | Freq: Every evening | ORAL | 1 refills | Status: DC | PRN

## 2019-11-04 NOTE — Patient Instructions (Signed)
Concern about obstructive sleep apnea with difficult to control hypertension  We will obtain a home sleep study  For insomnia -Trial with Lunesta  -I will see you back in about 6 to 8 weeks  -Call with significant concerns

## 2019-11-04 NOTE — Progress Notes (Signed)
Shelly Cohen    [id number]    Jul 02, 1961  Primary Care Physician:Nche, Charlene Brooke, NP  Referring Physician: Flossie Buffy, NP [address] [place],  [place] [address]  Chief complaint:   History of uncontrolled hypertension/difficulty to control hypertension  HPI:  Patient denies significant snoring, denies witnessed apneas Required multiple medications to control her blood pressure  She does have a history of insomnia, this started around when she started menopause Currently uses 20 mg of melatonin to help sleep  Without melatonin might take about 30 minutes to fall asleep, about 2-3 awakenings Usually goes to bed between 9 and 9:30 PM 10 minutes to fall asleep when she takes melatonin 5:30 AM is a usual wake up time She is not tired during the day Not sleepy during the day  Denies dryness of her mouth in the mornings Denies headaches  She did try Ambien in the past for insomnia-developed a rash following use  Able to function well during the day Memory is good Focus is good Never smoker  Outpatient Encounter Medications as of 11/04/2019  Medication Sig  . amLODipine (NORVASC) 10 MG tablet TAKE 1 TABLET BY MOUTH ONCE A DAY  . Ascorbic Acid (VITAMIN C PO) Take 1 tablet by mouth daily.   Marland Kitchen docusate sodium (COLACE) 100 MG capsule Take 1 capsule (100 mg total) by mouth 2 (two) times daily. (Patient taking differently: Take 100 mg by mouth 2 (two) times daily as needed for moderate constipation. )  . Latanoprostene Bunod (VYZULTA) 0.024 % SOLN Apply 1 drop to eye 2 (two) times daily.  Marland Kitchen levothyroxine (SYNTHROID) 50 MCG tablet Take 1 tablet (50 mcg total) by mouth daily before breakfast.  . lisinopril (ZESTRIL) 40 MG tablet Take 1 tablet (40 mg total) by mouth daily.  . Multiple Vitamin (MULTI-VITAMINS) TABS Take 1 tablet by mouth daily.   . Omega-3 Fatty Acids (FISH OIL PO) Take 1 capsule by mouth daily.   Marland Kitchen spironolactone (ALDACTONE) 50 MG  tablet Take 1 tablet (50 mg total) by mouth daily.  . carvedilol (COREG) 25 MG tablet Take 1 tablet (25 mg total) by mouth 2 (two) times daily.  . eszopiclone (LUNESTA) 2 MG TABS tablet Take 1 tablet (2 mg total) by mouth at bedtime as needed for sleep. Take immediately before bedtime  . methocarbamol (ROBAXIN-750) 750 MG tablet Take 1 tablet (750 mg total) by mouth every 8 (eight) hours as needed for muscle spasms. (Patient not taking: Reported on 11/04/2019)  . methylPREDNISolone (MEDROL DOSEPAK) 4 MG TBPK tablet Take as directed on package (Patient not taking: Reported on 11/04/2019)  . temazepam (RESTORIL) 7.5 MG capsule Take 1 capsule (7.5 mg total) by mouth at bedtime as needed for sleep.   No facility-administered encounter medications on file as of 11/04/2019.    Allergies as of 11/04/2019 - Review Complete 11/04/2019  Allergen Reaction Noted  . Diphenhydramine hcl Other (See Comments) 04/01/2016  . Hydrocodone Nausea Only 04/01/2016  . Oxycodone Nausea And Vomiting 09/12/2018  . Azithromycin Itching and Rash 04/01/2016  . Cephalexin Rash 04/01/2016  . Penicillin g Rash 04/01/2016  . Zolpidem Itching and Rash 04/01/2016    Past Medical History:  Diagnosis Date  . Alpha thalassemia trait   . Fibroadenoma   . Glaucoma    monitored every 76months, right eye worse than left   . History of positive PPD, treatment status unknown   . Hypertension   . Thyroid disease  Past Surgical History:  Procedure Laterality Date  . ABDOMINAL HYSTERECTOMY  1989  . BREAST BIOPSY  2013  . BUNIONECTOMY  10/2014  . CHOLECYSTECTOMY  2017  . COLONOSCOPY    . GALLBLADDER SURGERY    . hallux limi    . LAPAROSCOPY    . TOTAL HIP ARTHROPLASTY Right 09/12/2018   Procedure: RIGHT TOTAL HIP ARTHROPLASTY ANTERIOR APPROACH;  Surgeon: Mcarthur Rossetti, MD;  Location: [facility];  Service: Orthopedics;  Laterality: Right;  . TUBAL LIGATION      Family History  Problem Relation Age of Onset  .  Hypertension Mother   . Anemia Mother   . Arthritis Mother        rheumatoid arthritis  . Kidney disease Mother   . Cataracts Mother   . Heart disease Mother   . Hypertension Sister   . Cataracts Sister   . Hypertension Brother   . Glaucoma Brother   . Diabetes Brother   . Cancer Maternal Grandmother        leukemia  . Cancer Maternal Grandfather        lung  . Glaucoma Maternal Aunt     Social History   Socioeconomic History  . Marital status: Married    Spouse name: Not on file  . Number of children: Not on file  . Years of education: Not on file  . Highest education level: Not on file  Occupational History  . Not on file  Tobacco Use  . Smoking status: Never Smoker  . Smokeless tobacco: Never Used  Substance and Sexual Activity  . Alcohol use: No  . Drug use: No  . Sexual activity: Yes    Birth control/protection: Surgical  Other Topics Concern  . Not on file  Social History Narrative  . Not on file   Social Determinants of Health   Financial Resource Strain:   . Difficulty of Paying Living Expenses:   Food Insecurity:   . Worried About Charity fundraiser in the Last Year:   . Arboriculturist in the Last Year:   Transportation Needs:   . Film/video editor (Medical):   Marland Kitchen Lack of Transportation (Non-Medical):   Physical Activity:   . Days of Exercise per Week:   . Minutes of Exercise per Session:   Stress:   . Feeling of Stress :   Social Connections:   . Frequency of Communication with Friends and Family:   . Frequency of Social Gatherings with Friends and Family:   . Attends Religious Services:   . Active Member of Clubs or Organizations:   . Attends Archivist Meetings:   Marland Kitchen Marital Status:   Intimate Partner Violence:   . Fear of Current or Ex-Partner:   . Emotionally Abused:   Marland Kitchen Physically Abused:   . Sexually Abused:     Review of Systems  Respiratory: Negative.  Negative for shortness of breath.   Psychiatric/Behavioral:  Negative for sleep disturbance.    Vitals:   11/04/19 0934 11/04/19 0947  BP: 128/76 116/68  Pulse: 98 77  Temp: 98 F (36.7 C) (!) 97 F (36.1 C)  SpO2: 97% 100%     Physical Exam  Constitutional: She appears well-developed and well-nourished.  HENT:  Head: Normocephalic.  Mallampati 2, crowded oropharynx  Eyes: Pupils are equal, round, and reactive to light.  Neck: No tracheal deviation present. No thyromegaly present.  Cardiovascular: Normal rate and regular rhythm.  Pulmonary/Chest: Breath sounds normal. No respiratory  distress. She has no wheezes. She has no rales. She exhibits no tenderness.  Musculoskeletal:        General: No edema. Normal range of motion.     Cervical back: Normal range of motion and neck supple.  Neurological: She is alert.  Skin: Skin is warm.  Psychiatric: She has a normal mood and affect.    Results of the Epworth flowsheet 11/04/2019 08/22/2018  Sitting and reading 0 0  Watching TV 0 0  Sitting, inactive in a public place (e.g. a theatre or a meeting) 0 0  As a passenger in a car for an hour without a break 0 0  Lying down to rest in the afternoon when circumstances permit 0 1  Sitting and talking to someone 0 0  Sitting quietly after a lunch without alcohol 0 0  In a car, while stopped for a few minutes in traffic 0 0  Total score 0 1   Assessment:  Concern for obstructive sleep apnea  Insomnia -Multifactorial reasons for insomnia  Obesity  Plan/Recommendations: Encourage weight loss efforts  Trial of Lunesta for insomnia -2 mg at night -Use as needed  Encourage regular exercises  We will obtain a home sleep study to rule out significant obstructive sleep apnea  Pathophysiology of sleep disordered breathing discussed, treatment options for sleep disordered breathing discussed   Sherrilyn Rist MD [facility] 11/04/2019, 9:54 AM  CC: Nche, Charlene Brooke, NP

## 2019-11-09 ENCOUNTER — Encounter: Payer: Self-pay | Admitting: Pulmonary Disease

## 2019-11-13 NOTE — Telephone Encounter (Signed)
Received the following email from patient:   "I saw Dr Ander Slade on 11/04/19, he stated he was sending a Rx for Lunesta.  It was sent to the wrong pharmacy, I use [facility] due to my insurance and it was sent to [facility] on [address].  Please send to [facility].   I also was told I need a home sleep study.. when I left the office visit was I suppose have gotten it them?   When do I pick the home study up?  I am a little confused with the instructions with the home sleep study.  Please call me or send message through Mychart."  Explained to patient in detail about the HST. I have called Kristopher Oppenheim to cancel the [facility]   AO, can you send the Lunesta 2mg  to [facility]? Thank you!

## 2019-11-16 ENCOUNTER — Other Ambulatory Visit: Payer: Self-pay | Admitting: Pulmonary Disease

## 2019-11-16 MED ORDER — ESZOPICLONE 2 MG PO TABS: 2.0000 mg | ORAL_TABLET | Freq: Every evening | ORAL | 1 refills | Status: DC | PRN

## 2019-11-16 MED FILL — ESZOPICLONE 2 MG TAB: 2 | 30 days supply | Qty: 30 | Fill #0

## 2019-11-16 NOTE — Telephone Encounter (Signed)
Sent in [place]

## 2019-11-16 NOTE — Progress Notes (Signed)
Lunesta sent in

## 2019-11-18 ENCOUNTER — Other Ambulatory Visit: Payer: Self-pay | Admitting: Nurse Practitioner

## 2019-11-18 DIAGNOSIS — Z1231 Encounter for screening mammogram for malignant neoplasm of breast: Secondary | ICD-10-CM

## 2019-11-19 ENCOUNTER — Other Ambulatory Visit: Payer: Self-pay

## 2019-11-19 ENCOUNTER — Ambulatory Visit
Admission: RE | Admit: 2019-11-19 | Discharge: 2019-11-19 | Disposition: A | Discharge status: Home / Self Care | Payer: No Typology Code available for payment source | Source: Ambulatory Visit | Attending: Nurse Practitioner | Admitting: Nurse Practitioner

## 2019-11-19 DIAGNOSIS — Z1231 Encounter for screening mammogram for malignant neoplasm of breast: Secondary | ICD-10-CM

## 2019-11-24 ENCOUNTER — Other Ambulatory Visit: Payer: Self-pay | Admitting: Cardiology

## 2019-11-24 ENCOUNTER — Encounter: Payer: Self-pay | Admitting: Nurse Practitioner

## 2019-11-24 DIAGNOSIS — Z0100 Encounter for examination of eyes and vision without abnormal findings: Secondary | ICD-10-CM

## 2019-11-24 MED FILL — SPIRONOLACTONE 50 MG TABS: 50 | 90 days supply | Qty: 90 | Fill #0

## 2019-12-22 ENCOUNTER — Other Ambulatory Visit (HOSPITAL_COMMUNITY): Payer: Self-pay | Admitting: Ophthalmology

## 2019-12-22 MED FILL — LATANOPROST 0.005% OPTH SOL: 0.005 | 25 days supply | Qty: 3 | Fill #0

## 2019-12-28 ENCOUNTER — Other Ambulatory Visit: Payer: Self-pay | Admitting: Cardiology

## 2019-12-28 MED FILL — AMLODIPINE BESYLATE 10 MG T: 10 | 90 days supply | Qty: 90 | Fill #1

## 2019-12-28 MED FILL — LISINOPRIL 40 MG TABS: 40 | 90 days supply | Qty: 90 | Fill #1

## 2019-12-29 ENCOUNTER — Other Ambulatory Visit: Payer: Self-pay | Admitting: Cardiology

## 2019-12-29 MED FILL — CARVEDILOL 25 MG TABLET: 25 | 90 days supply | Qty: 180 | Fill #0

## 2019-12-30 IMAGING — RF OPERATIVE RIGHT HIP WITH PELVIS
1 series · 2 of 2 positions shown · non-contrast
Comparison: 04/22/2018

CLINICAL DATA: RIGHT total hip arthroplasty

EXAM:
OPERATIVE RIGHT HIP (WITH PELVIS IF PERFORMED) 2 VIEWS
TECHNIQUE: Fluoroscopic spot image(s) were submitted for interpretation
post-operatively.

[Series 1: unknown protocol · 0.20mm/px · 2 of 2 slices shown]
[im 1/2]
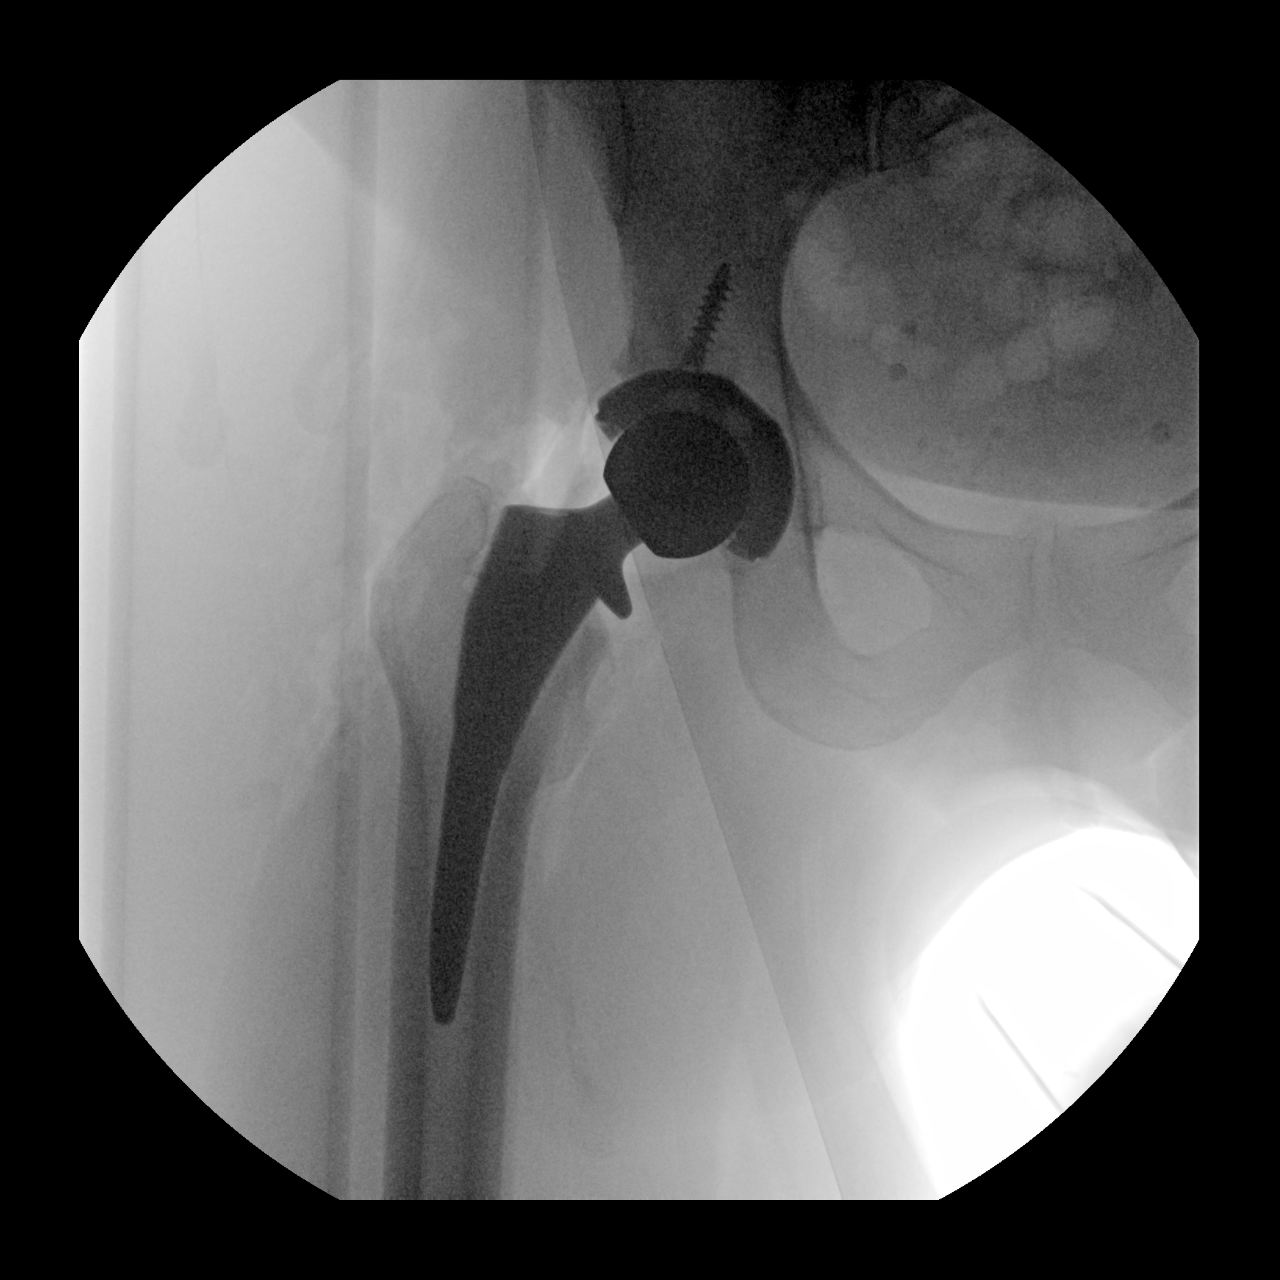
[im 2/2]
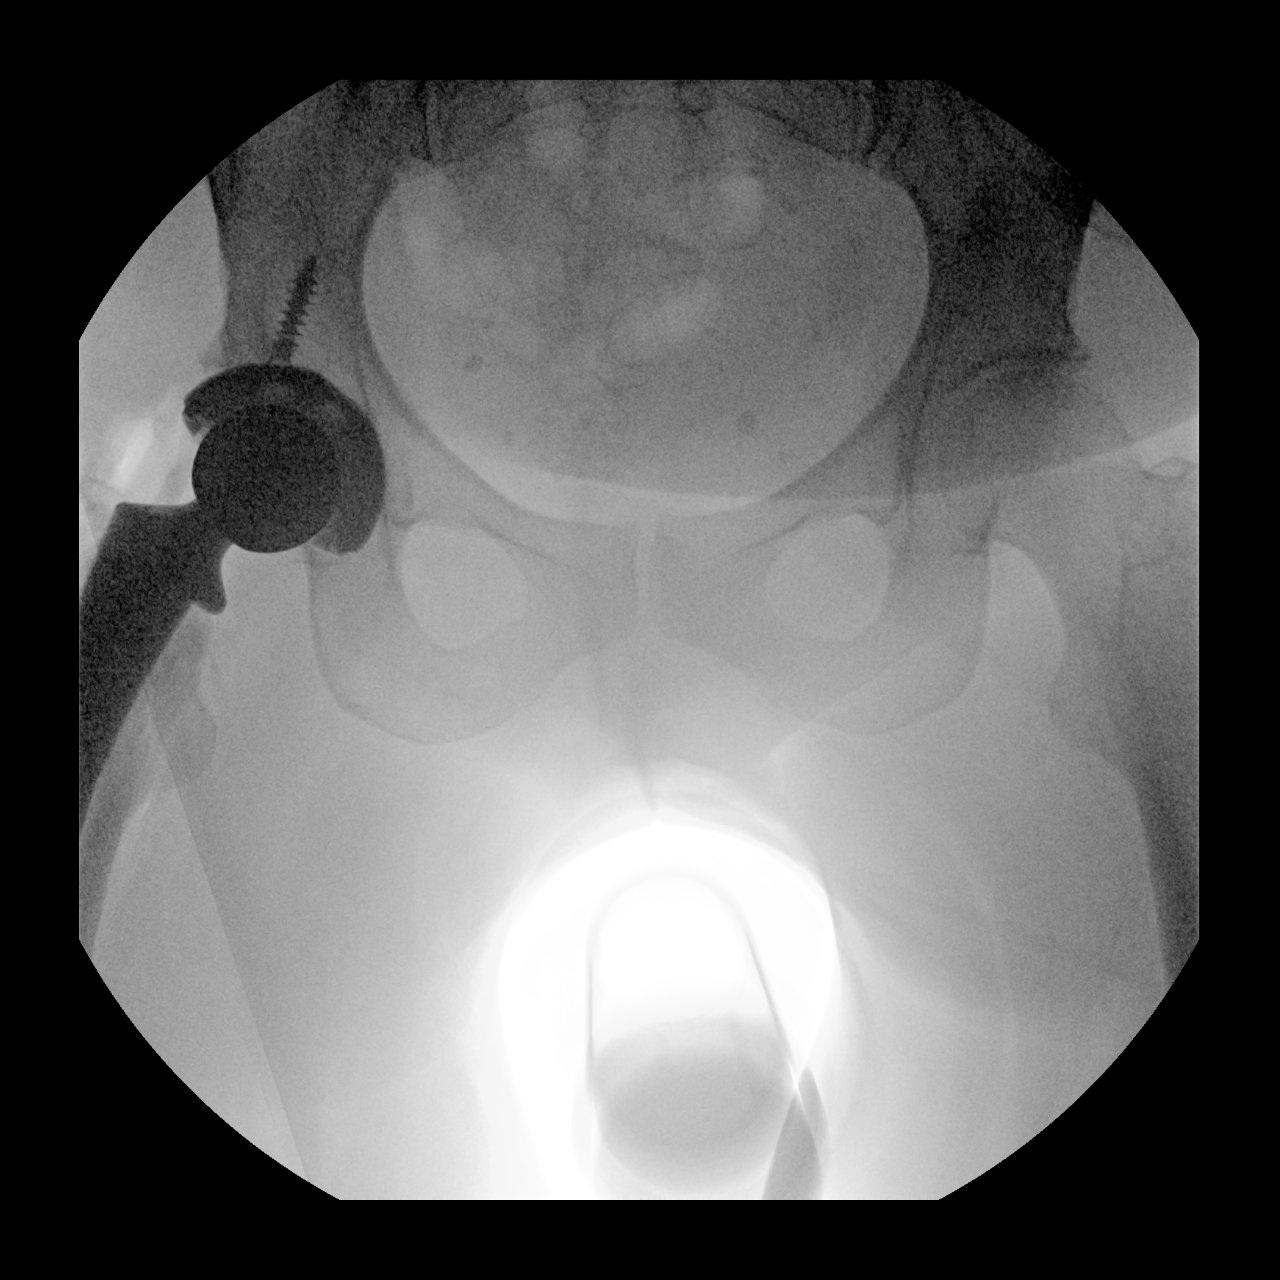

[2 of 2 positions shown; findings below may reference images not displayed]

FINDINGS: RIGHT total hip arthroplasty identified without acute fracture or
dislocation.

No definite complicating features noted.
IMPRESSION: RIGHT total hip arthroplasty without definite complicating features.

## 2020-01-05 ENCOUNTER — Ambulatory Visit (INDEPENDENT_AMBULATORY_CARE_PROVIDER_SITE_OTHER): Payer: No Typology Code available for payment source

## 2020-01-05 ENCOUNTER — Other Ambulatory Visit: Payer: Self-pay

## 2020-01-05 ENCOUNTER — Ambulatory Visit (INDEPENDENT_AMBULATORY_CARE_PROVIDER_SITE_OTHER): Payer: No Typology Code available for payment source | Admitting: Orthopaedic Surgery

## 2020-01-05 ENCOUNTER — Encounter: Payer: Self-pay | Admitting: Orthopaedic Surgery

## 2020-01-05 DIAGNOSIS — Z96641 Presence of right artificial hip joint: Secondary | ICD-10-CM

## 2020-01-05 DIAGNOSIS — M25552 Pain in left hip: Secondary | ICD-10-CM

## 2020-01-05 NOTE — Progress Notes (Signed)
Office Visit Note   Patient: Shelly Cohen           Date of Birth: September 04, 1960           MRN: [id number] Visit Date: 01/05/2020              Requested by: Flossie Buffy, NP [place],  [place] [address] PCP: Flossie Buffy, NP   Assessment & Plan: Visit Diagnoses:  1. Status post total replacement of right hip   2. Pain in left hip     Plan: At this point since she is doing so well follow-up can be as needed.  All questions and concerns were answered and addressed.  If any issues arise she will let us know.  Follow-Up Instructions: Return if symptoms worsen or fail to improve.   Orders:  Orders Placed This Encounter  Procedures  . XR HIPS BILAT W OR W/O PELVIS 3-4 VIEWS   No orders of the defined types were placed in this encounter.     Procedures: No procedures performed   Clinical Data: No additional findings.   Subjective: Chief Complaint  Patient presents with  . Right Hip - Follow-up  . Left Hip - Follow-up  The patient is well-known to me.  She has a history of a right total hip arthroplasty that we did in March 2020.  She is doing well following the right hip.  She has some discomfort sleeping on the right side at night and a few months ago we did inject the trochanteric area.  That has helped.  She still has some discomfort but not enough to need any type of injection.  She denies any groin pain with the right hip.  She has been having some left hip pain but she said that is gotten better.  She says she does still has a little bit of stiffness with that hip and with pivoting activities.  She is a very active 59 years old.  She has had no other acute change in her medical status.  HPI  Review of Systems She currently denies any headache, chest pain, shortness of breath, fever, chills, nausea, vomiting  Objective: Vital Signs: There were no vitals taken for this visit.  Physical Exam She is alert and oriented x3 and in  no acute distress Ortho Exam Examination of her right hip shows fluid and full range of motion with no discomfort at all.  There is minimal pain over the trochanteric area on the right side.  The left side shows some mild pain with internal and external rotation of that hip. Specialty Comments:  No specialty comments available.  Imaging: XR HIPS BILAT W OR W/O PELVIS 3-4 VIEWS  Result Date: 01/05/2020 A low AP pelvis and lateral of the right hip shows a well-seated total hip arthroplasty with no complicating features.  The left hip only shows some mild joint space narrowing but otherwise no acute findings.    PMFS History: Patient Active Problem List   Diagnosis Date Noted  . Hyperglycemia 08/13/2019  . Primary insomnia 08/13/2019  . Status post total replacement of right hip 09/12/2018  . Obesity (BMI 30-39.9) 08/22/2018  . Trigger thumb, left thumb 05/07/2018  . OA (osteoarthritis) of hip 04/22/2018  . Open angle primary glaucoma 01/08/2018  . Thalassemia trait, alpha 06/29/2016  . Iron deficiency anemia 06/29/2016  . S/P bunionectomy 06/29/2016  . HTN (hypertension), benign 05/18/2016  . Hypothyroidism 05/18/2016   Past Medical History:  Diagnosis  Date  . Alpha thalassemia trait   . Fibroadenoma   . Glaucoma    monitored every 30months, right eye worse than left   . History of positive PPD, treatment status unknown   . Hypertension   . Thyroid disease     Family History  Problem Relation Age of Onset  . Hypertension Mother   . Anemia Mother   . Arthritis Mother        rheumatoid arthritis  . Kidney disease Mother   . Cataracts Mother   . Heart disease Mother   . Hypertension Sister   . Cataracts Sister   . Hypertension Brother   . Glaucoma Brother   . Diabetes Brother   . Cancer Maternal Grandmother        leukemia  . Cancer Maternal Grandfather        lung  . Glaucoma Maternal Aunt     Past Surgical History:  Procedure Laterality Date  . ABDOMINAL  HYSTERECTOMY  1989  . BREAST BIOPSY  2013  . BUNIONECTOMY  10/2014  . CHOLECYSTECTOMY  2017  . COLONOSCOPY    . GALLBLADDER SURGERY    . hallux limi    . LAPAROSCOPY    . TOTAL HIP ARTHROPLASTY Right 09/12/2018   Procedure: RIGHT TOTAL HIP ARTHROPLASTY ANTERIOR APPROACH;  Surgeon: Mcarthur Rossetti, MD;  Location: [facility];  Service: Orthopedics;  Laterality: Right;  . TUBAL LIGATION     Social History   Occupational History  . Not on file  Tobacco Use  . Smoking status: Never Smoker  . Smokeless tobacco: Never Used  Vaping Use  . Vaping Use: Never used  Substance and Sexual Activity  . Alcohol use: No  . Drug use: No  . Sexual activity: Yes    Birth control/protection: Surgical

## 2020-01-07 ENCOUNTER — Ambulatory Visit: Payer: No Typology Code available for payment source

## 2020-01-07 ENCOUNTER — Other Ambulatory Visit: Payer: Self-pay

## 2020-01-07 DIAGNOSIS — I10 Essential (primary) hypertension: Secondary | ICD-10-CM

## 2020-01-07 DIAGNOSIS — G4733 Obstructive sleep apnea (adult) (pediatric): Secondary | ICD-10-CM | POA: Diagnosis not present

## 2020-01-09 ENCOUNTER — Encounter: Payer: Self-pay | Admitting: Nurse Practitioner

## 2020-01-18 ENCOUNTER — Telehealth (HOSPITAL_COMMUNITY): Payer: Self-pay | Admitting: Pulmonary Disease

## 2020-01-18 DIAGNOSIS — G4733 Obstructive sleep apnea (adult) (pediatric): Secondary | ICD-10-CM | POA: Diagnosis not present

## 2020-01-18 NOTE — Telephone Encounter (Signed)
Call patient  Sleep study result  Date of study: 01/07/2020  Impression:  Mild obstructive sleep apnea  Recommendation:  Options of treatment  An oral device may be considered for treatment of mild obstructive sleep apnea  CPAP therapy may be an option of treatment, if chosen as a means of treatment, auto titrating CPAP 5-15 will be appropriate pressures  Sleep position optimization by encouraging sleeping in a lateral position, elevating head of bed by about 30 degrees may help noted snoring  Aggressive weight loss measures and exercise with close monitoring of symptoms will be another mode of treatment  Caution against sleepy driving  Follow-up in 4 to 6 weeks

## 2020-01-19 NOTE — Telephone Encounter (Signed)
Contacted patient with results of home sleep study. Patient does not want to start any therapy at this time. She is going to follow up with Dr. Ander Slade in 8 weeks. Recall placed.

## 2020-02-08 ENCOUNTER — Ambulatory Visit (INDEPENDENT_AMBULATORY_CARE_PROVIDER_SITE_OTHER): Payer: No Typology Code available for payment source | Admitting: Nurse Practitioner

## 2020-02-08 ENCOUNTER — Encounter: Payer: Self-pay | Admitting: Nurse Practitioner

## 2020-02-08 ENCOUNTER — Other Ambulatory Visit: Payer: Self-pay

## 2020-02-08 VITALS — BP 128/82 | HR 76 | Temp 97.6°F | Ht 63.0 in | Wt 215.0 lb

## 2020-02-08 DIAGNOSIS — E6609 Other obesity due to excess calories: Secondary | ICD-10-CM | POA: Diagnosis not present

## 2020-02-08 DIAGNOSIS — Z13220 Encounter for screening for lipoid disorders: Secondary | ICD-10-CM | POA: Diagnosis not present

## 2020-02-08 DIAGNOSIS — Z136 Encounter for screening for cardiovascular disorders: Secondary | ICD-10-CM

## 2020-02-08 DIAGNOSIS — E039 Hypothyroidism, unspecified: Secondary | ICD-10-CM | POA: Diagnosis not present

## 2020-02-08 DIAGNOSIS — Z0000 Encounter for general adult medical examination without abnormal findings: Secondary | ICD-10-CM | POA: Diagnosis not present

## 2020-02-08 DIAGNOSIS — I10 Essential (primary) hypertension: Secondary | ICD-10-CM

## 2020-02-08 DIAGNOSIS — Z6834 Body mass index (BMI) 34.0-34.9, adult: Secondary | ICD-10-CM

## 2020-02-08 DIAGNOSIS — E66811 Obesity, class 1: Secondary | ICD-10-CM

## 2020-02-08 DIAGNOSIS — R739 Hyperglycemia, unspecified: Secondary | ICD-10-CM | POA: Diagnosis not present

## 2020-02-08 LAB — COMPREHENSIVE METABOLIC PANEL
ALT: 10 U/L (ref 0–35)
AST: 14 U/L (ref 0–37)
Albumin: 4.2 g/dL (ref 3.5–5.2)
Alkaline Phosphatase: 71 U/L (ref 39–117)
BUN: 11 mg/dL (ref 6–23)
CO2: 26 mEq/L (ref 19–32)
Calcium: 10.1 mg/dL (ref 8.4–10.5)
Chloride: 106 mEq/L (ref 96–112)
Creatinine, Ser: 0.88 mg/dL (ref 0.40–1.20)
GFR: 79.68 mL/min (ref >60.00)
Glucose, Bld: 108 mg/dL — ABNORMAL HIGH (ref 70–99)
Potassium: 4.3 mEq/L (ref 3.5–5.1)
Sodium: 144 mEq/L (ref 135–145)
Total Bilirubin: 0.4 mg/dL (ref 0.2–1.2)
Total Protein: 7.6 g/dL (ref 6.0–8.3)

## 2020-02-08 LAB — CBC
HCT: 36.6 % (ref 36.0–46.0)
Hemoglobin: 11.5 g/dL — ABNORMAL LOW (ref 12.0–15.0)
MCHC: 31.6 g/dL (ref 30.0–36.0)
MCV: 80 fl (ref 78.0–100.0)
Platelets: 273 10*3/uL (ref 150.0–400.0)
RBC: 4.57 Mil/uL (ref 3.87–5.11)
RDW: 16.9 % — ABNORMAL HIGH (ref 11.5–15.5)
WBC: 7.8 10*3/uL (ref 4.0–10.5)

## 2020-02-08 LAB — LIPID PANEL
Cholesterol: 174 mg/dL (ref 0–200)
HDL: 57.3 mg/dL (ref >39.00)
LDL Cholesterol: 100 mg/dL — ABNORMAL HIGH (ref 0–99)
NonHDL: 116.21
Total CHOL/HDL Ratio: 3
Triglycerides: 82 mg/dL (ref 0.0–149.0)
VLDL: 16.4 mg/dL (ref 0.0–40.0)

## 2020-02-08 LAB — T4, FREE: Free T4: 1.1 ng/dL (ref 0.60–1.60)

## 2020-02-08 LAB — TSH: TSH: 2.72 u[IU]/mL (ref 0.35–4.50)

## 2020-02-08 LAB — HEMOGLOBIN A1C: Hgb A1c MFr Bld: 6.3 % (ref 4.6–6.5)

## 2020-02-08 MED FILL — LEVOTHYROXINE 50 MCG TABLET: 50 | 90 days supply | Qty: 90 | Fill #1

## 2020-02-08 NOTE — Patient Instructions (Signed)
Go to lab for blood draw.  Will complete forms and let you know when they are ready for pick up  Preventive Care 58-59 Years Old, Female Preventive care refers to visits with your health care provider and lifestyle choices that can promote health and wellness. This includes:  A yearly physical exam. This may also be called an annual well check.  Regular dental visits and eye exams.  Immunizations.  Screening for certain conditions.  Healthy lifestyle choices, such as eating a healthy diet, getting regular exercise, not using drugs or products that contain nicotine and tobacco, and limiting alcohol use. What can I expect for my preventive care visit? Physical exam Your health care provider will check your:  Height and weight. This may be used to calculate body mass index (BMI), which tells if you are at a healthy weight.  Heart rate and blood pressure.  Skin for abnormal spots. Counseling Your health care provider may ask you questions about your:  Alcohol, tobacco, and drug use.  Emotional well-being.  Home and relationship well-being.  Sexual activity.  Eating habits.  Work and work Statistician.  Method of birth control.  Menstrual cycle.  Pregnancy history. What immunizations do I need?  Influenza (flu) vaccine  This is recommended every year. Tetanus, diphtheria, and pertussis (Tdap) vaccine  You may need a Td booster every 10 years. Varicella (chickenpox) vaccine  You may need this if you have not been vaccinated. Zoster (shingles) vaccine  You may need this after age 9. Measles, mumps, and rubella (MMR) vaccine  You may need at least one dose of MMR if you were born in 1957 or later. You may also need a second dose. Pneumococcal conjugate (PCV13) vaccine  You may need this if you have certain conditions and were not previously vaccinated. Pneumococcal polysaccharide (PPSV23) vaccine  You may need one or two doses if you smoke cigarettes or if  you have certain conditions. Meningococcal conjugate (MenACWY) vaccine  You may need this if you have certain conditions. Hepatitis A vaccine  You may need this if you have certain conditions or if you travel or work in places where you may be exposed to hepatitis A. Hepatitis B vaccine  You may need this if you have certain conditions or if you travel or work in places where you may be exposed to hepatitis B. Haemophilus influenzae type b (Hib) vaccine  You may need this if you have certain conditions. Human papillomavirus (HPV) vaccine  If recommended by your health care provider, you may need three doses over 6 months. You may receive vaccines as individual doses or as more than one vaccine together in one shot (combination vaccines). Talk with your health care provider about the risks and benefits of combination vaccines. What tests do I need? Blood tests  Lipid and cholesterol levels. These may be checked every 5 years, or more frequently if you are over 49 years old.  Hepatitis C test.  Hepatitis B test. Screening  Lung cancer screening. You may have this screening every year starting at age 90 if you have a 30-pack-year history of smoking and currently smoke or have quit within the past 15 years.  Colorectal cancer screening. All adults should have this screening starting at age 34 and continuing until age 67. Your health care provider may recommend screening at age 9 if you are at increased risk. You will have tests every 1-10 years, depending on your results and the type of screening test.  Diabetes screening.  This is done by checking your blood sugar (glucose) after you have not eaten for a while (fasting). You may have this done every 1-3 years.  Mammogram. This may be done every 1-2 years. Talk with your health care provider about when you should start having regular mammograms. This may depend on whether you have a family history of breast cancer.  BRCA-related cancer  screening. This may be done if you have a family history of breast, ovarian, tubal, or peritoneal cancers.  Pelvic exam and Pap test. This may be done every 3 years starting at age 28. Starting at age 46, this may be done every 5 years if you have a Pap test in combination with an HPV test. Other tests  Sexually transmitted disease (STD) testing.  Bone density scan. This is done to screen for osteoporosis. You may have this scan if you are at high risk for osteoporosis. Follow these instructions at home: Eating and drinking  Eat a diet that includes fresh fruits and vegetables, whole grains, lean protein, and low-fat dairy.  Take vitamin and mineral supplements as recommended by your health care provider.  Do not drink alcohol if: ? Your health care provider tells you not to drink. ? You are pregnant, may be pregnant, or are planning to become pregnant.  If you drink alcohol: ? Limit how much you have to 0-1 drink a day. ? Be aware of how much alcohol is in your drink. In the [place] one drink equals one 12 oz bottle of beer (355 mL), one 5 oz glass of wine (148 mL), or one 1 oz glass of hard liquor (44 mL). Lifestyle  Take daily care of your teeth and gums.  Stay active. Exercise for at least 30 minutes on 5 or more days each week.  Do not use any products that contain nicotine or tobacco, such as cigarettes, e-cigarettes, and chewing tobacco. If you need help quitting, ask your health care provider.  If you are sexually active, practice safe sex. Use a condom or other form of birth control (contraception) in order to prevent pregnancy and STIs (sexually transmitted infections).  If told by your health care provider, take low-dose aspirin daily starting at age 10. What's next?  Visit your health care provider once a year for a well check visit.  Ask your health care provider how often you should have your eyes and teeth checked.  Stay up to date on all vaccines. This  information is not intended to replace advice given to you by your health care provider. Make sure you discuss any questions you have with your health care provider. Document Revised: 02/27/2018 Document Reviewed: 02/27/2018 Elsevier Patient Education  2020 [employer].

## 2020-02-08 NOTE — Assessment & Plan Note (Addendum)
BP at goal with amlodipine, coreg, lisinopril, and spironolactone. Under the care of cardiology Sleep study completed 01/07/2020: mild OSA BP Readings from Last 3 Encounters:  02/08/20 128/82  11/04/19 116/68  10/23/19 129/79    continue current medications and encourage participation in weight loss program.

## 2020-02-08 NOTE — Assessment & Plan Note (Addendum)
BMI at 34.70 She is considering gastric sleeve. Unsuccessful with phentermine, contrave and diet modifications (weight watcher, nutrisystem, intermittent fasting with slim fast). Over the years she has developed labile HTN, OSA, Prediabetes, and OA of hip Her exercise regimen is limited due to chronic left hip pain. Wt Readings from Last 3 Encounters:  02/08/20 215 lb (97.5 kg)  11/04/19 194 lb (88 kg)  10/23/19 212 lb (96.2 kg)  no Hx of anesthesia complication with previous surgeries, no Hx of PE/DVT, no known hx of CAD. No FHx of CAD or PE/DVT or anesthesia complication.  Ok to proceed with gastric sleeve program.

## 2020-02-08 NOTE — Assessment & Plan Note (Signed)
Stable

## 2020-02-08 NOTE — Progress Notes (Addendum)
Subjective:    Patient ID: Shelly Cohen, female    DOB: 01-Dec-1960, 59 y.o.   MRN: [id number]  Patient presents today for CPE and eval of chronic conditions: HTN and Obesity  HPI Obesity (BMI 30-39.9) BMI at 34.70 She is considering gastric sleeve. Unsuccessful with phentermine, contrave and diet modifications (weight watcher, nutrisystem, intermittent fasting with slim fast). Over the years she has developed labile HTN, OSA, Prediabetes, and OA of hip Her exercise regimen is limited due to chronic left hip pain. Wt Readings from Last 3 Encounters:  02/08/20 215 lb (97.5 kg)  11/04/19 194 lb (88 kg)  10/23/19 212 lb (96.2 kg)  no Hx of anesthesia complication with previous surgeries, no Hx of PE/DVT, no known hx of CAD. No FHx of CAD or PE/DVT or anesthesia complication.  Ok to proceed with gastric sleeve program.  HTN (hypertension), benign  BP at goal with amlodipine, coreg, lisinopril, and spironolactone. Under the care of cardiology Sleep study completed 01/07/2020: mild OSA BP Readings from Last 3 Encounters:  02/08/20 128/82  11/04/19 116/68  10/23/19 129/79    continue current medications and encourage participation in weight loss program.  Hypothyroidism Stable  Sexual History (orientation,birth control, marital status, STD):s/p hysterectomy, breast and pelvic exam done by GYN per patient  Depression/Suicide: Depression screen [facility] 2/9 02/08/2020 08/11/2019 08/07/2018 01/08/2018 06/14/2017 10/19/2016 10/05/2016  Decreased Interest 0 0 0 0 0 0 0  Down, Depressed, Hopeless 0 0 - 0 0 0 0  PHQ - 2 Score 0 0 0 0 0 0 0   Vision:up to date  Dental:up to date  Immunizations: (TDAP, Hep C screen, Pneumovax, Influenza, zoster)  Health Maintenance  Topic Date Due  . Flu Shot  01/31/2020  . Mammogram  11/18/2021  . Colon Cancer Screening  09/20/2022  . Tetanus Vaccine  12/27/2027  . COVID-19 Vaccine  Completed  .  Hepatitis C: One time screening is recommended by  Center for Disease Control  (CDC) for  adults born from 18 through 1965.   Completed  . HIV Screening  Completed  . Pap Smear  Discontinued  Diet:DASh diet  Weight:  Wt Readings from Last 3 Encounters:  02/08/20 215 lb (97.5 kg)  11/04/19 194 lb (88 kg)  10/23/19 212 lb (96.2 kg)    Fall Risk: Fall Risk  02/08/2020 08/11/2019 08/07/2018 01/08/2018 06/14/2017 10/19/2016 10/05/2016  Falls in the past year? 0 0 0 No Yes No No  Number falls in past yr: 0 - - - 1 - -  Injury with Fall? 0 - - - Yes - -  Comment - - - - hit left side face - -   Medications and allergies reviewed with patient and updated if appropriate.  Patient Active Problem List   Diagnosis Date Noted  . Hyperglycemia 08/13/2019  . Primary insomnia 08/13/2019  . Status post total replacement of right hip 09/12/2018  . Obesity (BMI 30-39.9) 08/22/2018  . Trigger thumb, left thumb 05/07/2018  . OA (osteoarthritis) of hip 04/22/2018  . Open angle primary glaucoma 01/08/2018  . Thalassemia trait, alpha 06/29/2016  . Iron deficiency anemia 06/29/2016  . S/P bunionectomy 06/29/2016  . HTN (hypertension), benign 05/18/2016  . Hypothyroidism 05/18/2016    Current Outpatient Medications on File Prior to Visit  Medication Sig Dispense Refill  . Ascorbic Acid (VITAMIN C PO) Take 1 tablet by mouth daily.     . carvedilol (COREG) 25 MG tablet TAKE 1 TABLET BY MOUTH 2 TIMES DAILY. [place]  tablet 2  . docusate sodium (COLACE) 100 MG capsule Take 1 capsule (100 mg total) by mouth 2 (two) times daily. (Patient taking differently: Take 100 mg by mouth 2 (two) times daily as needed for moderate constipation. ) 10 capsule 0  . eszopiclone (LUNESTA) 2 MG TABS tablet Take 1 tablet (2 mg total) by mouth at bedtime as needed for sleep. Take immediately before bedtime 30 tablet 1  . Latanoprostene Bunod (VYZULTA) 0.024 % SOLN Apply 1 drop to eye 2 (two) times daily.    Marland Kitchen lisinopril (ZESTRIL) 40 MG tablet Take 1 tablet (40 mg total) by mouth daily.  90 tablet 3  . Multiple Vitamin (MULTI-VITAMINS) TABS Take 1 tablet by mouth daily.     . Omega-3 Fatty Acids (FISH OIL PO) Take 1 capsule by mouth daily.     Marland Kitchen spironolactone (ALDACTONE) 50 MG tablet TAKE 1 TABLET (50 MG TOTAL) BY MOUTH DAILY. 90 tablet 2  . latanoprost (XALATAN) 0.005 % ophthalmic solution SMARTSIG:1 Drop(s) In Eye(s) Every Evening     No current facility-administered medications on file prior to visit.    Past Medical History:  Diagnosis Date  . Alpha thalassemia trait   . Fibroadenoma   . Glaucoma    monitored every 64months, right eye worse than left   . History of positive PPD, treatment status unknown   . Hypertension   . Thyroid disease     Past Surgical History:  Procedure Laterality Date  . ABDOMINAL HYSTERECTOMY  1989  . BREAST BIOPSY  2013  . BUNIONECTOMY  10/2014  . CHOLECYSTECTOMY  2017  . COLONOSCOPY    . GALLBLADDER SURGERY    . hallux limi    . LAPAROSCOPY    . TOTAL HIP ARTHROPLASTY Right 09/12/2018   Procedure: RIGHT TOTAL HIP ARTHROPLASTY ANTERIOR APPROACH;  Surgeon: Mcarthur Rossetti, MD;  Location: [facility];  Service: Orthopedics;  Laterality: Right;  . TUBAL LIGATION      Social History   Socioeconomic History  . Marital status: Married    Spouse name: Not on file  . Number of children: Not on file  . Years of education: Not on file  . Highest education level: Not on file  Occupational History  . Not on file  Tobacco Use  . Smoking status: Never Smoker  . Smokeless tobacco: Never Used  Vaping Use  . Vaping Use: Never used  Substance and Sexual Activity  . Alcohol use: No  . Drug use: No  . Sexual activity: Yes    Birth control/protection: Surgical  Other Topics Concern  . Not on file  Social History Narrative  . Not on file   Social Determinants of Health   Financial Resource Strain:   . Difficulty of Paying Living Expenses:   Food Insecurity:   . Worried About Charity fundraiser in the Last Year:   . Youth worker in the Last Year:   Transportation Needs:   . Film/video editor (Medical):   Marland Kitchen Lack of Transportation (Non-Medical):   Physical Activity:   . Days of Exercise per Week:   . Minutes of Exercise per Session:   Stress:   . Feeling of Stress :   Social Connections:   . Frequency of Communication with Friends and Family:   . Frequency of Social Gatherings with Friends and Family:   . Attends Religious Services:   . Active Member of Clubs or Organizations:   . Attends Archivist Meetings:   .  Marital Status:     Family History  Problem Relation Age of Onset  . Hypertension Mother   . Anemia Mother   . Arthritis Mother        rheumatoid arthritis  . Kidney disease Mother   . Cataracts Mother   . Heart disease Mother   . Hypertension Sister   . Cataracts Sister   . Hypertension Brother   . Glaucoma Brother   . Diabetes Brother   . Cancer Maternal Grandmother        leukemia  . Cancer Maternal Grandfather        lung  . Glaucoma Maternal Aunt        Review of Systems  Constitutional: Negative for fever, malaise/fatigue and weight loss.  HENT: Negative for congestion and sore throat.   Eyes:       Negative for visual changes  Respiratory: Negative for cough and shortness of breath.   Cardiovascular: Negative for chest pain, palpitations and leg swelling.  Gastrointestinal: Negative for blood in stool, constipation, diarrhea and heartburn.  Genitourinary: Negative for dysuria, frequency and urgency.  Musculoskeletal: Negative for falls, joint pain and myalgias.  Skin: Negative for rash.  Neurological: Negative for dizziness, sensory change and headaches.  Endo/Heme/Allergies: Does not bruise/bleed easily.  Psychiatric/Behavioral: Negative for depression, substance abuse and suicidal ideas. The patient is not nervous/anxious.     Objective:   Vitals:   02/08/20 0817  BP: 128/82  Pulse: 76  Temp: 97.6 F (36.4 C)  SpO2: 99%    Body mass  index is 34.7 kg/m.   Physical Examination:  Physical Exam Vitals reviewed.  Constitutional:      General: She is not in acute distress.    Appearance: She is well-developed. She is obese.  HENT:     Right Ear: Tympanic membrane, ear canal and external ear normal.     Left Ear: Tympanic membrane, ear canal and external ear normal.  Eyes:     Extraocular Movements: Extraocular movements intact.     Conjunctiva/sclera: Conjunctivae normal.  Cardiovascular:     Rate and Rhythm: Normal rate and regular rhythm.     Pulses: Normal pulses.     Heart sounds: Normal heart sounds.  Pulmonary:     Effort: Pulmonary effort is normal. No respiratory distress.     Breath sounds: Normal breath sounds.  Chest:     Chest wall: No tenderness.  Abdominal:     General: Bowel sounds are normal.     Palpations: Abdomen is soft.  Genitourinary:    Comments: Deferred breast and pelvic exam to GYN per patient Musculoskeletal:        General: Normal range of motion.     Cervical back: Normal range of motion and neck supple.     Right lower leg: No edema.     Left lower leg: No edema.  Lymphadenopathy:     Cervical: No cervical adenopathy.  Skin:    General: Skin is warm and dry.  Neurological:     Mental Status: She is alert and oriented to person, place, and time.     Deep Tendon Reflexes: Reflexes are normal and symmetric.  Psychiatric:        Mood and Affect: Mood normal.        Behavior: Behavior normal.        Thought Content: Thought content normal.        Judgment: Judgment normal.     ASSESSMENT and PLAN: This visit occurred during  the SARS-CoV-2 public health emergency.  Safety protocols were in place, including screening questions prior to the visit, additional usage of staff PPE, and extensive cleaning of exam room while observing appropriate contact time as indicated for disinfecting solutions.   Shelly Cohen was seen today for annual exam.  Diagnoses and all orders for this  visit:  Preventative health care -     CBC -     Lipid panel -     Comprehensive metabolic panel  Hyperglycemia -     Hemoglobin A1c  HTN (hypertension), benign -     amLODipine (NORVASC) 10 MG tablet; Take 1 tablet (10 mg total) by mouth daily.  Hypothyroidism, unspecified type -     TSH -     T4, free -     levothyroxine (SYNTHROID) 50 MCG tablet; Take 1 tablet (50 mcg total) by mouth daily before breakfast.  Encounter for lipid screening for cardiovascular disease -     Lipid panel  Class 1 obesity due to excess calories with serious comorbidity and body mass index (BMI) of 34.0 to 34.9 in adult      Problem List Items Addressed This Visit      Cardiovascular and Mediastinum   HTN (hypertension), benign     BP at goal with amlodipine, coreg, lisinopril, and spironolactone. Under the care of cardiology Sleep study completed 01/07/2020: mild OSA BP Readings from Last 3 Encounters:  02/08/20 128/82  11/04/19 116/68  10/23/19 129/79    continue current medications and encourage participation in weight loss program.      Relevant Medications   amLODipine (NORVASC) 10 MG tablet     Endocrine   Hypothyroidism    Stable      Relevant Medications   levothyroxine (SYNTHROID) 50 MCG tablet   Other Relevant Orders   TSH (Completed)   T4, free (Completed)     Other   Hyperglycemia   Relevant Orders   Hemoglobin A1c (Completed)   Obesity (BMI 30-39.9)    BMI at 34.70 She is considering gastric sleeve. Unsuccessful with phentermine, contrave and diet modifications (weight watcher, nutrisystem, intermittent fasting with slim fast). Over the years she has developed labile HTN, OSA, Prediabetes, and OA of hip Her exercise regimen is limited due to chronic left hip pain. Wt Readings from Last 3 Encounters:  02/08/20 215 lb (97.5 kg)  11/04/19 194 lb (88 kg)  10/23/19 212 lb (96.2 kg)  no Hx of anesthesia complication with previous surgeries, no Hx of PE/DVT, no  known hx of CAD. No FHx of CAD or PE/DVT or anesthesia complication.  Ok to proceed with gastric sleeve program.       Other Visit Diagnoses    Preventative health care    -  Primary   Relevant Orders   CBC (Completed)   Lipid panel (Completed)   Comprehensive metabolic panel (Completed)   Encounter for lipid screening for cardiovascular disease       Relevant Orders   Lipid panel (Completed)      Follow up: Return in about 6 months (around 08/10/2020) for HTN and, hyperlipidemia (fasting).  Wilfred Lacy, NP

## 2020-02-09 ENCOUNTER — Encounter: Payer: Self-pay | Admitting: Nurse Practitioner

## 2020-02-09 ENCOUNTER — Other Ambulatory Visit: Payer: Self-pay | Admitting: Nurse Practitioner

## 2020-02-09 DIAGNOSIS — G4733 Obstructive sleep apnea (adult) (pediatric): Secondary | ICD-10-CM | POA: Insufficient documentation

## 2020-02-09 DIAGNOSIS — M1990 Unspecified osteoarthritis, unspecified site: Secondary | ICD-10-CM | POA: Insufficient documentation

## 2020-02-09 MED ORDER — LEVOTHYROXINE SODIUM 50 MCG PO TABS: 50.0000 ug | ORAL_TABLET | Freq: Every day | ORAL | 1 refills | Status: DC

## 2020-02-09 MED ORDER — AMLODIPINE BESYLATE 10 MG PO TABS: 10.0000 mg | ORAL_TABLET | Freq: Every day | ORAL | 3 refills | Status: DC

## 2020-02-15 MED FILL — ESZOPICLONE 2 MG TAB: 2 | 30 days supply | Qty: 30 | Fill #1

## 2020-02-15 MED FILL — LATANOPROST 0.005% OPTH SOL: 0.005 | 25 days supply | Qty: 3 | Fill #1

## 2020-02-17 ENCOUNTER — Encounter: Payer: Self-pay | Admitting: Nurse Practitioner

## 2020-03-08 ENCOUNTER — Encounter: Payer: Self-pay | Admitting: Nurse Practitioner

## 2020-03-17 MED FILL — SPIRONOLACTONE 50 MG TABLET: 50 | 90 days supply | Qty: 90 | Fill #1

## 2020-03-28 MED FILL — LISINOPRIL 40 MG TABS: 40 | 90 days supply | Qty: 90 | Fill #2

## 2020-03-28 MED FILL — AMLODIPINE BESYLATE 10 MG T: 10 | 90 days supply | Qty: 90 | Fill #2

## 2020-04-06 MED FILL — LATANOPROST 0.005% OPTH SOL: 0.005 | 25 days supply | Qty: 3 | Fill #2

## 2020-05-04 MED FILL — CARVEDILOL 25 MG TABLET: 25 | 90 days supply | Qty: 180 | Fill #1

## 2020-05-04 MED FILL — LEVOTHYROXINE 50 MCG TABLET: 50 | 90 days supply | Qty: 90 | Fill #0

## 2020-06-10 MED FILL — LATANOPROST 0.005% OPTH SOL: 0.005 | 25 days supply | Qty: 3 | Fill #3

## 2020-06-20 MED FILL — SPIRONOLACTONE 50 MG TABLET: 50 | 90 days supply | Qty: 90 | Fill #2

## 2020-06-28 ENCOUNTER — Encounter: Payer: Self-pay | Admitting: Orthopaedic Surgery

## 2020-07-06 MED FILL — LISINOPRIL 40 MG TABS: 40 | 90 days supply | Qty: 90 | Fill #3

## 2020-07-06 MED FILL — AMLODIPINE BESYLATE 10 MG T: 10 | 90 days supply | Qty: 90 | Fill #0

## 2020-07-13 ENCOUNTER — Encounter: Payer: Self-pay | Admitting: Nurse Practitioner

## 2020-07-13 ENCOUNTER — Encounter: Payer: Self-pay | Admitting: Orthopaedic Surgery

## 2020-07-13 ENCOUNTER — Ambulatory Visit (INDEPENDENT_AMBULATORY_CARE_PROVIDER_SITE_OTHER): Payer: No Typology Code available for payment source | Admitting: Orthopaedic Surgery

## 2020-07-13 ENCOUNTER — Other Ambulatory Visit: Payer: Self-pay

## 2020-07-13 ENCOUNTER — Ambulatory Visit (INDEPENDENT_AMBULATORY_CARE_PROVIDER_SITE_OTHER): Payer: No Typology Code available for payment source

## 2020-07-13 DIAGNOSIS — M25551 Pain in right hip: Secondary | ICD-10-CM

## 2020-07-13 DIAGNOSIS — M7061 Trochanteric bursitis, right hip: Secondary | ICD-10-CM

## 2020-07-13 MED ORDER — DICLOFENAC SODIUM 75 MG PO TBEC: 75.0000 mg | DELAYED_RELEASE_TABLET | Freq: Two times a day (BID) | ORAL | 1 refills | Status: DC | PRN

## 2020-07-13 MED ORDER — METHYLPREDNISOLONE ACETATE 40 MG/ML IJ SUSP
40.0000 mg | INTRAMUSCULAR | Status: AC | PRN
  Administered 2020-07-13: 40 mg via INTRA_ARTICULAR

## 2020-07-13 MED ORDER — LIDOCAINE HCL 1 % IJ SOLN
3.0000 mL | INTRAMUSCULAR | Status: AC | PRN
  Administered 2020-07-13: 3 mL

## 2020-07-13 MED FILL — DICLOFENAC SODIUM 75 MG TAB: 75 | 30 days supply | Qty: 60 | Fill #0

## 2020-07-13 NOTE — Progress Notes (Signed)
Office Visit Note   Patient: Shelly Cohen           Date of Birth: [birth year]           MRN: [id number] Visit Date: 07/13/2020              Requested by: Flossie Buffy, NP [place],   [id number] PCP: Flossie Buffy, NP   Assessment & Plan: Visit Diagnoses:  1. Pain in right hip   2. Trochanteric bursitis, right hip     Plan: I did give her reassurance and the right hip replacement is doing well.  I feel that she has developed significant right hip trochanteric bursitis and proximal IT band syndrome.  She would definitely benefit from outpatient physical therapy for these areas and she agrees.  I offered a steroid injection of the trochanteric area.  I explained the risk benefits of steroid injections and she tolerated it well.  I will also have her start diclofenac twice daily as needed for inflammation.  All questions and concerns were answered and addressed.  We will work on setting up outpatient physical therapy for any modalities per the therapist discretion.  I will see her back in 2 months to see how she is doing overall.  Follow-Up Instructions: Return in about 2 months (around 09/10/2020).   Orders:  Orders Placed This Encounter  Procedures  . Large Joint Inj  . XR HIP UNILAT W OR W/O PELVIS 1V RIGHT   Meds ordered this encounter  Medications  . diclofenac (VOLTAREN) 75 MG EC tablet    Sig: Take 1 tablet (75 mg total) by mouth 2 (two) times daily between meals as needed.    Dispense:  60 tablet    Refill:  1      Procedures: Large Joint Inj: R greater trochanter on 07/13/2020 9:20 AM Indications: pain and diagnostic evaluation Details: 22 G 1.5 in needle, lateral approach  Arthrogram: No  Medications: 3 mL lidocaine 1 %; 40 mg methylPREDNISolone acetate 40 MG/ML Outcome: tolerated well, no immediate complications Procedure, treatment alternatives, risks and benefits explained, specific risks discussed. Consent was given  by the patient. Immediately prior to procedure a time out was called to verify the correct patient, procedure, equipment, support staff and site/side marked as required. Patient was prepped and draped in the usual sterile fashion.       Clinical Data: No additional findings.   Subjective: Chief Complaint  Patient presents with  . Right Hip - Pain  The patient is a 60 year old female well-known to me.  She had a right total hip arthroplasty performed through a direct anterior approach in March 2020.  For the last 2 months she has had hip pain but points to the lateral aspect of her right hip as a source of her pain.  She is trying to stay active.  Outside of her hip hurts when she lays on the hip.  Occasionally she does get right groin pain.  She is walking without assistive device and has had no acute change in her medical status.  HPI  Review of Systems She currently denies any headache, chest pain, shortness of breath, fever, chills, nausea, vomiting  Objective: Vital Signs: There were no vitals taken for this visit.  Physical Exam She is alert and orient x3 and in no acute distress Ortho Exam Examination right hip shows it moves smoothly and fluidly with no pain in the groin and no pain to compression of the  hip.  There is pain to palpation over the trochanteric area and she is very sensitive over the trochanteric area and the proximal IT band. Specialty Comments:  No specialty comments available.  Imaging: XR HIP UNILAT W OR W/O PELVIS 1V RIGHT  Result Date: 07/13/2020 An AP pelvis and lateral right hip shows a well-seated total hip arthroplasty with no evidence of loosening or complicating features.    PMFS History: Patient Active Problem List   Diagnosis Date Noted  . OSA (obstructive sleep apnea) 02/09/2020  . DJD (degenerative joint disease) 02/09/2020  . Prediabetes 08/13/2019  . Primary insomnia 08/13/2019  . Status post total replacement of right hip 09/12/2018   . Obesity (BMI 30-39.9) 08/22/2018  . Trigger thumb, left thumb 05/07/2018  . OA (osteoarthritis) of hip 04/22/2018  . Open angle primary glaucoma 01/08/2018  . Thalassemia trait, alpha 06/29/2016  . Iron deficiency anemia 06/29/2016  . S/P bunionectomy 06/29/2016  . HTN (hypertension), benign 05/18/2016  . Hypothyroidism 05/18/2016   Past Medical History:  Diagnosis Date  . Alpha thalassemia trait   . Fibroadenoma   . Glaucoma    monitored every 80months, right eye worse than left   . History of positive PPD, treatment status unknown   . Hypertension   . Thyroid disease     Family History  Problem Relation Age of Onset  . Hypertension Mother   . Anemia Mother   . Arthritis Mother        rheumatoid arthritis  . Kidney disease Mother   . Cataracts Mother   . Heart disease Mother   . Hypertension Sister   . Cataracts Sister   . Hypertension Brother   . Glaucoma Brother   . Diabetes Brother   . Cancer Maternal Grandmother        leukemia  . Cancer Maternal Grandfather        lung  . Glaucoma Maternal Aunt     Past Surgical History:  Procedure Laterality Date  . ABDOMINAL HYSTERECTOMY  1989  . BREAST BIOPSY  2013  . BUNIONECTOMY  10/2014  . CHOLECYSTECTOMY  2017  . COLONOSCOPY    . GALLBLADDER SURGERY    . hallux limi    . LAPAROSCOPY    . TOTAL HIP ARTHROPLASTY Right 09/12/2018   Procedure: RIGHT TOTAL HIP ARTHROPLASTY ANTERIOR APPROACH;  Surgeon: Mcarthur Rossetti, MD;  Location: [facility];  Service: Orthopedics;  Laterality: Right;  . TUBAL LIGATION     Social History   Occupational History  . Not on file  Tobacco Use  . Smoking status: Never Smoker  . Smokeless tobacco: Never Used  Vaping Use  . Vaping Use: Never used  Substance and Sexual Activity  . Alcohol use: No  . Drug use: No  . Sexual activity: Yes    Birth control/protection: Surgical

## 2020-07-25 ENCOUNTER — Encounter: Payer: Self-pay | Admitting: Nurse Practitioner

## 2020-08-11 ENCOUNTER — Other Ambulatory Visit: Payer: Self-pay

## 2020-08-11 ENCOUNTER — Encounter: Payer: Self-pay | Admitting: Physical Therapy

## 2020-08-11 ENCOUNTER — Ambulatory Visit (INDEPENDENT_AMBULATORY_CARE_PROVIDER_SITE_OTHER): Payer: No Typology Code available for payment source | Admitting: Physical Therapy

## 2020-08-11 DIAGNOSIS — R262 Difficulty in walking, not elsewhere classified: Secondary | ICD-10-CM | POA: Diagnosis not present

## 2020-08-11 DIAGNOSIS — M25651 Stiffness of right hip, not elsewhere classified: Secondary | ICD-10-CM | POA: Diagnosis not present

## 2020-08-11 DIAGNOSIS — M25551 Pain in right hip: Secondary | ICD-10-CM

## 2020-08-11 DIAGNOSIS — M6281 Muscle weakness (generalized): Secondary | ICD-10-CM | POA: Diagnosis not present

## 2020-08-11 NOTE — Therapy (Signed)
[facility] Physical Therapy [address] [place], [place], [address] Phone: [phone]   Fax:  [phone]  Physical Therapy Evaluation  Patient Details  Name: Shelly Cohen MRN: [id number] Date of Birth: 1961/03/12 Referring Provider (PT): Jean Rosenthal, MD   Encounter Date: 08/11/2020   PT End of Session - 08/11/20 1650    Visit Number 1    Number of Visits 8    Date for PT Re-Evaluation 10/06/20    PT Start Time 1603    PT Stop Time 1645    PT Time Calculation (min) 42 min    Activity Tolerance Patient limited by pain    Behavior During Therapy [facility] for tasks assessed/performed           Past Medical History:  Diagnosis Date  . Alpha thalassemia trait   . Fibroadenoma   . Glaucoma    monitored every 64months, right eye worse than left   . History of positive PPD, treatment status unknown   . Hypertension   . Thyroid disease     Past Surgical History:  Procedure Laterality Date  . ABDOMINAL HYSTERECTOMY  1989  . BREAST BIOPSY  2013  . BUNIONECTOMY  10/2014  . CHOLECYSTECTOMY  2017  . COLONOSCOPY    . GALLBLADDER SURGERY    . hallux limi    . LAPAROSCOPY    . TOTAL HIP ARTHROPLASTY Right 09/12/2018   Procedure: RIGHT TOTAL HIP ARTHROPLASTY ANTERIOR APPROACH;  Surgeon: Mcarthur Rossetti, MD;  Location: [facility];  Service: Orthopedics;  Laterality: Right;  . TUBAL LIGATION      There were no vitals filed for this visit.    Subjective Assessment - 08/11/20 1553    Subjective Patient has been having Rt. hip pain that radiates down lateral side of her knee and into her groin at times. It has been going on about 6 months. It prevents her from sitting more than 10 minutes and she cannot walk for more than 20 without extreme pain. 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 She had a Rt hip replacement march 2021 and had PT but never got  back to full function. She has also been getting injections and they provide a week of relief and then pain is constant again. Meds prescribed by Dr. have not helped but voltaren cream seems to help. She has follow up with Dr. on 3/12.    Pertinent History HTN    Limitations Sitting;Standing;Walking;House hold activities    How long can you sit comfortably? 10-15 min    How long can you stand comfortably? 10-20 mins    How long can you walk comfortably? 10 mins    Diagnostic tests Xray: 01/05/2020  A low AP pelvis and lateral of the right hip shows a well-seated total hip arthroplasty with no complicating features.  The left hip only shows some mild joint space narrowing but otherwise no acute findings    Currently in Pain? Yes    Pain Score 4    gotten to a 9   Pain Location Hip    Pain Orientation Right    Pain Descriptors / Indicators Aching;Burning;Numbness    Pain Type Chronic pain    Pain Radiating Towards lateral knee    Pain Onset More than a month ago    Pain Frequency Constant    Aggravating Factors  sitting and walking  Pain Relieving Factors voltaren    Effect of Pain on Daily Activities walk at least 30 mins              [facility] PT Assessment - 08/11/20 0001      Assessment   Medical Diagnosis Right trochanteric bursitis    Referring Provider (PT) Jean Rosenthal, MD    Onset Date/Surgical Date 02/09/20    Next MD Visit 09/10/2020    Prior Therapy yes, 6 months      Precautions   Precautions None      Restrictions   Weight Bearing Restrictions No      Balance Screen   Has the patient fallen in the past 6 months No      [place] residence    Additional Comments no stairs      Prior Function   Level of Independence Independent    Vocation Requirements Work from home      Cognition   Overall Cognitive Status Within Functional Limits for tasks assessed      Observation/Other Assessments   Focus on Therapeutic  Outcomes (FOTO)  57% functional intake score, 69% predicted      ROM / Strength   AROM / PROM / Strength AROM;Strength      AROM   Overall AROM  Deficits;Due to pain    Overall AROM Comments Hip mobility significantly decreased compared to L side, pain restricts motion    AROM Assessment Site Hip    Right/Left Hip Right      Strength   Strength Assessment Site Hip    Right/Left Hip Right    Right Hip Flexion 3-/5    Right Hip Extension 3-/5    Right Hip External Rotation  3-/5    Right Hip Internal Rotation 3-/5    Right Hip ABduction 3-/5      Palpation   Patella mobility TTP along IT band into glute med, some tenderness in Low back      Special Tests    Special Tests Hip Special Tests      Ambulation/Gait   Ambulation/Gait Yes    Ambulation/Gait Assistance 7: Independent    Gait Pattern Antalgic;Decreased stance time - right;Decreased hip/knee flexion - right      Balance   Balance Assessed Yes   SLS 6 seconds, painful                     Objective measurements completed on examination: See above findings.       [facility] PT Treatment/Exercise - 08/11/20 0001      Modalities   Modalities Moist Heat      Moist Heat Therapy   Number Minutes Moist Heat 10 Minutes    Moist Heat Location Hip   Right side in supine                 PT Education - 08/11/20 1554    Education Details HEP, POC    Person(s) Educated Patient    Methods Explanation;Demonstration;Handout    Comprehension Verbalized understanding;Returned demonstration            PT Short Term Goals - 08/11/20 1658      PT SHORT TERM GOAL #1   Title Patient will be I with HEP    Time 4    Period Weeks    Status New    Target Date 09/08/20  PT Long Term Goals - 08/11/20 1658      PT LONG TERM GOAL #1   Title Patient will improve FOTO score    Baseline baseline functional outcome score 57%, 69 predicted    Time 8    Period Weeks    Status New     Target Date 10/06/20      PT LONG TERM GOAL #2   Title Patient will show improved Hip ROM without increased pain 3/10      PT LONG TERM GOAL #3   Title Patient will be able to sleep through the night 50% better with decreased interruptions from pain    Time 8    Period Weeks    Status New    Target Date 10/06/20      PT LONG TERM GOAL #4   Title Patient will demonstrate the ability to tolerate standing and sitting for at least 30 minutes without pain increasing over 3/10    Baseline can only sit and stand for 10-15 minutes before chanigng position    Time 8    Period Weeks    Status New    Target Date 10/06/20                  Plan - 08/11/20 1653    Clinical Impression Statement Patient presents as 60 y/o female with chronic Right sided Hip pain for the past 6 months. She presents with moderate to severe pain that limits her ability to stand and sit especially conflicting with her work capabilities. She has limited ROM, liited by pain in comparison to the opposite side. Passive Hip motion is also painful and limited. She also has generalized weakness in her Hip area. She does have low back pain at times that comes with the hip pain at times which could be reffered from the back. She is very tender to palpation and painful from the ITband origin to her glute med region. Moist heat seemed to improve pain slightly as well as gentle lumbar repeated extensions. Patient may benefit form skilled PT to alleviate pain with gentle stretching, joint mobilizations and gentle strenghtening. Patient has follow up with her Dr. in 4 weeks, reassess continually to see patient progress/regress.    Personal Factors and Comorbidities Comorbidity 1    Comorbidities Lt hip arthritis    Examination-Activity Limitations Locomotion Level;Sleep;Squat;Stand;Bend    Examination-Participation Restrictions Occupation    Stability/Clinical Decision Making Evolving/Moderate complexity    Clinical Decision  Making Moderate    Rehab Potential Fair    PT Frequency 1x / week    PT Duration 8 weeks    PT Treatment/Interventions ADLs/Self Care Home Management;Aquatic Therapy;Biofeedback;Cryotherapy;Electrical Stimulation;Ultrasound;Traction;Moist Heat;Iontophoresis 4mg /ml Dexamethasone;Gait training;Stair training;Functional mobility training;Therapeutic activities;Therapeutic exercise;Balance training;Neuromuscular re-education;Manual techniques;Passive range of motion;Dry needling;Taping;Joint Manipulations    PT Next Visit Plan Assess spinal mobility, check in with HEP, distraction, slight strengthening exercises and stretches    PT Home Exercise Plan Access Code: [email or web address]           Patient will benefit from skilled therapeutic intervention in order to improve the following deficits and impairments:  Decreased range of motion,Difficulty walking,Abnormal gait,Decreased endurance,Decreased activity tolerance,Pain,Decreased balance,Decreased mobility,Decreased strength,Impaired flexibility  Visit Diagnosis: Pain in right hip  Muscle weakness (generalized)  Stiffness of right hip, not elsewhere classified     Problem List Patient Active Problem List   Diagnosis Date Noted  . OSA (obstructive sleep apnea) 02/09/2020  . DJD (degenerative joint disease) 02/09/2020  . Prediabetes 08/13/2019  .  Primary insomnia 08/13/2019  . Status post total replacement of right hip 09/12/2018  . Obesity (BMI 30-39.9) 08/22/2018  . Trigger thumb, left thumb 05/07/2018  . OA (osteoarthritis) of hip 04/22/2018  . Open angle primary glaucoma 01/08/2018  . Thalassemia trait, alpha 06/29/2016  . Iron deficiency anemia 06/29/2016  . S/P bunionectomy 06/29/2016  . HTN (hypertension), benign 05/18/2016  . Hypothyroidism 05/18/2016    Glenetta Hew, SPT  08/11/2020, 5:19 PM During this treatment session, this physical therapist was present, participating in and directing the treatment.   This note has been  reviewed and this clinician agrees with the information provided.  Elsie Ra, PT, DPT 08/12/20 8:10 AM  [facility] Physical Therapy [address] [place], [place], [address] Phone: [phone]   Fax:  [phone]  Name: Shelly Cohen MRN: [id number] Date of Birth: 10-27-1960

## 2020-08-11 NOTE — Patient Instructions (Signed)
Access Code: [email or web address] URL: [email or web address] Date: 08/11/2020 Prepared by: Elsie Ra  Exercises Standing Lumbar Extension at [place] - 2 x daily - 7 x weekly - 1-2 sets - 10 reps Frogger - 2 x daily - 7 x weekly - 1-2 sets - 10 reps ITB Stretch at Wall - 2 x daily - 7 x weekly - 1 sets - 3 reps - 20-30 hold Standing Hip Internal Rotation Stretch on Step - 2 x daily - 7 x weekly - 10 reps - 15-30 hold Supine Bridge - 2 x daily - 7 x weekly - 1-2 sets - 10 reps - 5 hold

## 2020-08-16 ENCOUNTER — Other Ambulatory Visit: Payer: Self-pay

## 2020-08-16 ENCOUNTER — Encounter: Payer: Self-pay | Admitting: Physical Therapy

## 2020-08-16 ENCOUNTER — Ambulatory Visit (INDEPENDENT_AMBULATORY_CARE_PROVIDER_SITE_OTHER): Payer: No Typology Code available for payment source | Admitting: Physical Therapy

## 2020-08-16 DIAGNOSIS — M25651 Stiffness of right hip, not elsewhere classified: Secondary | ICD-10-CM

## 2020-08-16 DIAGNOSIS — R262 Difficulty in walking, not elsewhere classified: Secondary | ICD-10-CM

## 2020-08-16 DIAGNOSIS — M25551 Pain in right hip: Secondary | ICD-10-CM | POA: Diagnosis not present

## 2020-08-16 DIAGNOSIS — M6281 Muscle weakness (generalized): Secondary | ICD-10-CM

## 2020-08-16 NOTE — Therapy (Signed)
[facility] Physical Therapy [address] [place], [place], [address] Phone: [phone]   Fax:  [phone]  Physical Therapy Treatment  Patient Details  Name: Shelly Cohen MRN: [id number] Date of Birth: [birth year] Referring Provider (PT): Jean Rosenthal, MD   Encounter Date: 08/16/2020   PT End of Session - 08/16/20 1627    Visit Number 2    Number of Visits 8    Date for PT Re-Evaluation 10/06/20    PT Start Time 6440    PT Stop Time 1600    PT Time Calculation (min) 45 min    Activity Tolerance Patient limited by pain    Behavior During Therapy [facility] for tasks assessed/performed           Past Medical History:  Diagnosis Date  . Alpha thalassemia trait   . Fibroadenoma   . Glaucoma    monitored every 56months, right eye worse than left   . History of positive PPD, treatment status unknown   . Hypertension   . Thyroid disease     Past Surgical History:  Procedure Laterality Date  . ABDOMINAL HYSTERECTOMY  1989  . BREAST BIOPSY  2013  . BUNIONECTOMY  10/2014  . CHOLECYSTECTOMY  2017  . COLONOSCOPY    . GALLBLADDER SURGERY    . hallux limi    . LAPAROSCOPY    . TOTAL HIP ARTHROPLASTY Right 09/12/2018   Procedure: RIGHT TOTAL HIP ARTHROPLASTY ANTERIOR APPROACH;  Surgeon: Mcarthur Rossetti, MD;  Location: [facility];  Service: Orthopedics;  Laterality: Right;  . TUBAL LIGATION      There were no vitals filed for this visit.   Subjective Assessment - 08/16/20 1553    Subjective Pt arriving today reporting no pain upon arrival when standing. Pt reporting with sitting her pain can increase to 5-6/10.    Pertinent History HTN    Limitations Sitting;Standing;Walking;House hold activities    How long can you sit comfortably? 10-15 min    How long can you stand comfortably? 10-20 mins    How long can you walk comfortably? 10 mins    Diagnostic tests Xray: 01/05/2020  A low AP pelvis and lateral of the right hip shows a well-seated  total hip arthroplasty with no complicating features.  The left hip only shows some mild joint space narrowing but otherwise no acute findings    Currently in Pain? Yes    Pain Score 6     Pain Location Hip    Pain Orientation Right    Pain Descriptors / Indicators Aching;Sore;Sharp;Tightness    Pain Type Chronic pain    Pain Onset More than a month ago    Pain Frequency Constant              [facility] PT Assessment - 08/16/20 0001      Assessment   Medical Diagnosis Right trochanteric bursitis    Referring Provider (PT) Jean Rosenthal, MD    Onset Date/Surgical Date 02/09/20      Special Tests    Special Tests Lumbar;Hip Special Tests    Other special tests + Thomas Test on R    Lumbar Tests Straight Leg Raise    Hip Special Tests  Hip Scouring      Straight Leg Raise   Findings Negative    Side  Right      Hip Scouring   Findings Positive    Side Right  [facility] Adult PT Treatment/Exercise - 08/16/20 0001      Exercises   Exercises Knee/Hip      Knee/Hip Exercises: Stretches   Hip Flexor Stretch Right;60 seconds    ITB Stretch 3 reps;Right    Piriformis Stretch 3 reps;30 seconds    Other Knee/Hip Stretches trunk rotation x 3 to each side holding 30 seconds      Knee/Hip Exercises: Supine   Heel Slides AROM;10 reps    Bridges 10 reps    Other Supine Knee/Hip Exercises clams x 10      Modalities   Modalities Moist Heat      Moist Heat Therapy   Number Minutes Moist Heat 5 Minutes    Moist Heat Location Hip      Manual Therapy   Manual Therapy Soft tissue mobilization    Manual therapy comments Long Axis distraction R LE    Soft tissue mobilization IASTM to iliospsoas                    PT Short Term Goals - 08/16/20 1623      PT SHORT TERM GOAL #1   Title Patient will be I with HEP    Status On-going             PT Long Term Goals - 08/11/20 1658      PT LONG TERM GOAL #1   Title Patient  will improve FOTO score    Baseline baseline functional outcome score 57%, 69 predicted    Time 8    Period Weeks    Status New    Target Date 10/06/20      PT LONG TERM GOAL #2   Title Patient will show improved Hip ROM without increased pain 3/10      PT LONG TERM GOAL #3   Title Patient will be able to sleep through the night 50% better with decreased interruptions from pain    Time 8    Period Weeks    Status New    Target Date 10/06/20      PT LONG TERM GOAL #4   Title Patient will demonstrate the ability to tolerate standing and sitting for at least 30 minutes without pain increasing over 3/10    Baseline can only sit and stand for 10-15 minutes before chanigng position    Time 8    Period Weeks    Status New    Target Date 10/06/20                 Plan - 08/16/20 1603    Clinical Impression Statement Pt presenting today still reporting 5-6/10 pain with sitting and no pain with standing. Pt tolerating hip stretching with pain noted. Pt presenting with positive Thomas Test on R LE and tightness noted in R IT band. Pt with increased tenderness noted in lateral hip and with greater  trochanter palpation. Negative R Leg Raise and positive hip scourning test. Pt with good response to moist heat with streching and IASTM to R IT band and iliopsosas. Long Axis distraction performed with pt reporting relief. Continue skilled PT.    Personal Factors and Comorbidities Comorbidity 1    Comorbidities Lt hip arthritis    Examination-Activity Limitations Locomotion Level;Sleep;Squat;Stand;Bend    Examination-Participation Restrictions Occupation    Stability/Clinical Decision Making Evolving/Moderate complexity    Rehab Potential Fair    PT Frequency 1x / week    PT Duration 8 weeks    PT Treatment/Interventions  ADLs/Self Care Home Management;Aquatic Therapy;Biofeedback;Cryotherapy;Electrical Stimulation;Ultrasound;Traction;Moist Heat;Iontophoresis 4mg /ml Dexamethasone;Gait  training;Stair training;Functional mobility training;Therapeutic activities;Therapeutic exercise;Balance training;Neuromuscular re-education;Manual techniques;Passive range of motion;Dry needling;Taping;Joint Manipulations    PT Next Visit Plan Assess spinal mobility, check in with HEP, distraction, slight strengthening exercises and stretches    PT Home Exercise Plan Access Code: [email or web address]    Consulted and Agree with Plan of Care Patient           Patient will benefit from skilled therapeutic intervention in order to improve the following deficits and impairments:  Decreased range of motion,Difficulty walking,Abnormal gait,Decreased endurance,Decreased activity tolerance,Pain,Decreased balance,Decreased mobility,Decreased strength,Impaired flexibility  Visit Diagnosis: Pain in right hip  Muscle weakness (generalized)  Stiffness of right hip, not elsewhere classified  Difficulty in walking, not elsewhere classified     Problem List Patient Active Problem List   Diagnosis Date Noted  . OSA (obstructive sleep apnea) 02/09/2020  . DJD (degenerative joint disease) 02/09/2020  . Prediabetes 08/13/2019  . Primary insomnia 08/13/2019  . Status post total replacement of right hip 09/12/2018  . Obesity (BMI 30-39.9) 08/22/2018  . Trigger thumb, left thumb 05/07/2018  . OA (osteoarthritis) of hip 04/22/2018  . Open angle primary glaucoma 01/08/2018  . Thalassemia trait, alpha 06/29/2016  . Iron deficiency anemia 06/29/2016  . S/P bunionectomy 06/29/2016  . HTN (hypertension), benign 05/18/2016  . Hypothyroidism 05/18/2016    Oretha Caprice, PT, MPT 08/16/2020, 4:28 PM  [facility] Physical Therapy [address] [place], [place], [address] Phone: [phone]   Fax:  [phone]  Name: Shelly Cohen MRN: [id number] Date of Birth: 08/31/1960

## 2020-08-17 ENCOUNTER — Other Ambulatory Visit (HOSPITAL_COMMUNITY): Payer: Self-pay | Admitting: Emergency Medicine

## 2020-08-17 ENCOUNTER — Emergency Department (HOSPITAL_COMMUNITY)
Admission: EM | Admit: 2020-08-17 | Discharge: 2020-08-17 | Disposition: A | Discharge status: Home / Self Care | Payer: No Typology Code available for payment source | Attending: Emergency Medicine | Admitting: Emergency Medicine

## 2020-08-17 ENCOUNTER — Emergency Department (HOSPITAL_COMMUNITY): Payer: No Typology Code available for payment source

## 2020-08-17 ENCOUNTER — Encounter (HOSPITAL_COMMUNITY): Payer: Self-pay

## 2020-08-17 DIAGNOSIS — R0789 Other chest pain: Secondary | ICD-10-CM

## 2020-08-17 DIAGNOSIS — Z79899 Other long term (current) drug therapy: Secondary | ICD-10-CM | POA: Insufficient documentation

## 2020-08-17 DIAGNOSIS — I10 Essential (primary) hypertension: Secondary | ICD-10-CM | POA: Insufficient documentation

## 2020-08-17 DIAGNOSIS — Z96641 Presence of right artificial hip joint: Secondary | ICD-10-CM | POA: Insufficient documentation

## 2020-08-17 DIAGNOSIS — E039 Hypothyroidism, unspecified: Secondary | ICD-10-CM | POA: Diagnosis not present

## 2020-08-17 DIAGNOSIS — R079 Chest pain, unspecified: Secondary | ICD-10-CM | POA: Diagnosis present

## 2020-08-17 LAB — BASIC METABOLIC PANEL WITH GFR
Anion gap: 10 (ref 5–15)
BUN: 16 mg/dL (ref 6–20)
CO2: 26 mmol/L (ref 22–32)
Calcium: 10 mg/dL (ref 8.9–10.3)
Chloride: 106 mmol/L (ref 98–111)
Creatinine, Ser: 0.94 mg/dL (ref 0.44–1.00)
GFR, Estimated: >60 mL/min
Glucose, Bld: 111 mg/dL — ABNORMAL HIGH (ref 70–99)
Potassium: 4.4 mmol/L (ref 3.5–5.1)
Sodium: 142 mmol/L (ref 135–145)

## 2020-08-17 LAB — CBC
HCT: 38.6 % (ref 36.0–46.0)
Hemoglobin: 11.7 g/dL — ABNORMAL LOW (ref 12.0–15.0)
MCH: 25.3 pg — ABNORMAL LOW (ref 26.0–34.0)
MCHC: 30.3 g/dL (ref 30.0–36.0)
MCV: 83.5 fL (ref 80.0–100.0)
Platelets: 341 K/uL (ref 150–400)
RBC: 4.62 MIL/uL (ref 3.87–5.11)
RDW: 17.2 % — ABNORMAL HIGH (ref 11.5–15.5)
WBC: 9 K/uL (ref 4.0–10.5)
nRBC: 0 % (ref 0.0–0.2)

## 2020-08-17 LAB — TROPONIN I (HIGH SENSITIVITY)
Troponin I (High Sensitivity): 2 ng/L (ref <18)
Troponin I (High Sensitivity): 3 ng/L

## 2020-08-17 LAB — I-STAT BETA HCG BLOOD, ED (MC, WL, AP ONLY): I-stat hCG, quantitative: <5 m[IU]/mL

## 2020-08-17 LAB — D-DIMER, QUANTITATIVE: D-Dimer, Quant: 1.45 ug/mL-FEU — ABNORMAL HIGH (ref 0.00–0.50)

## 2020-08-17 MED ORDER — HYDROMORPHONE HCL 4 MG PO TABS: 4.0000 mg | ORAL_TABLET | Freq: Four times a day (QID) | ORAL | 0 refills | Status: DC | PRN

## 2020-08-17 MED ORDER — OXYCODONE-ACETAMINOPHEN 5-325 MG PO TABS
1.0000 | ORAL_TABLET | Freq: Once | ORAL | Status: AC
  Administered 2020-08-17: 1 via ORAL
  Filled 2020-08-17: qty 1

## 2020-08-17 MED ORDER — IOHEXOL 350 MG/ML SOLN
100.0000 mL | Freq: Once | INTRAVENOUS | Status: AC | PRN
  Administered 2020-08-17: 100 mL via INTRAVENOUS

## 2020-08-17 MED ORDER — ONDANSETRON HCL 4 MG/2ML IJ SOLN
4.0000 mg | Freq: Once | INTRAMUSCULAR | Status: AC
  Administered 2020-08-17: 4 mg via INTRAVENOUS
  Filled 2020-08-17: qty 2

## 2020-08-17 NOTE — ED Triage Notes (Signed)
Pt presents with c/o stabbing chest pain in the center of her chest that started last night.

## 2020-08-17 NOTE — ED Provider Notes (Signed)
[facility] DEPT Provider Note   CSN: [id number] Arrival date & time: 08/17/20  1525     History Chief Complaint  Patient presents with  . Chest Pain    Shelly Cohen is a 60 y.o. female.  Patient complains of left-sided chest pain.  Pain hurts worse with movement some inspiration.  The history is provided by the patient and medical records. No language interpreter was used.  Chest Pain Pain location:  L chest Pain quality: aching   Pain radiates to:  Does not radiate Pain severity:  Moderate Onset quality:  Sudden Timing:  Constant Progression:  Waxing and waning Chronicity:  New Associated symptoms: no abdominal pain, no back pain, no cough, no fatigue and no headache        Past Medical History:  Diagnosis Date  . Alpha thalassemia trait   . Fibroadenoma   . Glaucoma    monitored every 1months, right eye worse than left   . History of positive PPD, treatment status unknown   . Hypertension   . Thyroid disease     Patient Active Problem List   Diagnosis Date Noted  . OSA (obstructive sleep apnea) 02/09/2020  . DJD (degenerative joint disease) 02/09/2020  . Prediabetes 08/13/2019  . Primary insomnia 08/13/2019  . Status post total replacement of right hip 09/12/2018  . Obesity (BMI 30-39.9) 08/22/2018  . Trigger thumb, left thumb 05/07/2018  . OA (osteoarthritis) of hip 04/22/2018  . Open angle primary glaucoma 01/08/2018  . Thalassemia trait, alpha 06/29/2016  . Iron deficiency anemia 06/29/2016  . S/P bunionectomy 06/29/2016  . HTN (hypertension), benign 05/18/2016  . Hypothyroidism 05/18/2016    Past Surgical History:  Procedure Laterality Date  . ABDOMINAL HYSTERECTOMY  1989  . BREAST BIOPSY  2013  . BUNIONECTOMY  10/2014  . CHOLECYSTECTOMY  2017  . COLONOSCOPY    . GALLBLADDER SURGERY    . hallux limi    . LAPAROSCOPY    . TOTAL HIP ARTHROPLASTY Right 09/12/2018   Procedure: RIGHT TOTAL HIP ARTHROPLASTY  ANTERIOR APPROACH;  Surgeon: Mcarthur Rossetti, MD;  Location: [facility];  Service: Orthopedics;  Laterality: Right;  . TUBAL LIGATION       OB History   No obstetric history on file.     Family History  Problem Relation Age of Onset  . Hypertension Mother   . Anemia Mother   . Arthritis Mother        rheumatoid arthritis  . Kidney disease Mother   . Cataracts Mother   . Heart disease Mother   . Hypertension Sister   . Cataracts Sister   . Hypertension Brother   . Glaucoma Brother   . Diabetes Brother   . Cancer Maternal Grandmother        leukemia  . Cancer Maternal Grandfather        lung  . Glaucoma Maternal Aunt     Social History   Tobacco Use  . Smoking status: Never Smoker  . Smokeless tobacco: Never Used  Vaping Use  . Vaping Use: Never used  Substance Use Topics  . Alcohol use: No  . Drug use: No    Home Medications Prior to Admission medications   Medication Sig Start Date End Date Taking? Authorizing Provider  amLODipine (NORVASC) 10 MG tablet Take 1 tablet (10 mg total) by mouth daily. 02/09/20   Nche, Charlene Brooke, NP  Ascorbic Acid (VITAMIN C PO) Take 1 tablet by mouth daily.  [provider]  carvedilol (COREG) 25 MG tablet TAKE 1 TABLET BY MOUTH 2 TIMES DAILY. 12/29/19   Sueanne Margarita, MD  diclofenac (VOLTAREN) 75 MG EC tablet Take 1 tablet (75 mg total) by mouth 2 (two) times daily between meals as needed. 07/13/20   Mcarthur Rossetti, MD  docusate sodium (COLACE) 100 MG capsule Take 1 capsule (100 mg total) by mouth 2 (two) times daily. Patient taking differently: Take 100 mg by mouth 2 (two) times daily as needed for moderate constipation.  08/07/18   Nche, Charlene Brooke, NP  eszopiclone (LUNESTA) 2 MG TABS tablet Take 1 tablet (2 mg total) by mouth at bedtime as needed for sleep. Take immediately before bedtime 11/16/19   Olalere, Adewale A, MD  HYDROmorphone (DILAUDID) 4 MG tablet Take 1 tablet (4 mg total) by mouth every 6  (six) hours as needed for severe pain. 08/17/20   Milton Ferguson, MD  latanoprost (XALATAN) 0.005 % ophthalmic solution SMARTSIG:1 Drop(s) In Eye(s) Every Evening 12/22/19   [provider]  Latanoprostene Bunod (VYZULTA) 0.024 % SOLN Apply 1 drop to eye 2 (two) times daily. 01/23/18   Nche, Charlene Brooke, NP  levothyroxine (SYNTHROID) 50 MCG tablet Take 1 tablet (50 mcg total) by mouth daily before breakfast. 02/09/20   Nche, Charlene Brooke, NP  lisinopril (ZESTRIL) 40 MG tablet Take 1 tablet (40 mg total) by mouth daily. 08/13/19   Nche, Charlene Brooke, NP  Multiple Vitamin (MULTI-VITAMINS) TABS Take 1 tablet by mouth daily.     [provider]  Omega-3 Fatty Acids (FISH OIL PO) Take 1 capsule by mouth daily.     [provider]  spironolactone (ALDACTONE) 50 MG tablet TAKE 1 TABLET (50 MG TOTAL) BY MOUTH DAILY. 11/24/19   Sueanne Margarita, MD    Allergies    Diphenhydramine hcl, Hydrocodone, Oxycodone, Azithromycin, Cephalexin, Penicillin g, and Zolpidem  Review of Systems   Review of Systems  Constitutional: Negative for appetite change and fatigue.  HENT: Negative for congestion, ear discharge and sinus pressure.   Eyes: Negative for discharge.  Respiratory: Negative for cough.   Cardiovascular: Positive for chest pain.  Gastrointestinal: Negative for abdominal pain and diarrhea.  Genitourinary: Negative for frequency and hematuria.  Musculoskeletal: Negative for back pain.  Skin: Negative for rash.  Neurological: Negative for seizures and headaches.  Psychiatric/Behavioral: Negative for hallucinations.    Physical Exam Updated Vital Signs BP 128/67 (BP Location: Right Arm)   Pulse 67   Temp 98 F (36.7 C) (Oral)   Resp 16   SpO2 99%   Physical Exam Vitals and nursing note reviewed.  Constitutional:      Appearance: She is well-developed.  HENT:     Head: Normocephalic.     Nose: Nose normal.  Eyes:     General: No scleral icterus.    Extraocular  Movements: EOM normal.     Conjunctiva/sclera: Conjunctivae normal.  Neck:     Thyroid: No thyromegaly.  Cardiovascular:     Rate and Rhythm: Normal rate and regular rhythm.     Heart sounds: No murmur heard. No friction rub. No gallop.   Pulmonary:     Breath sounds: No stridor. No wheezing or rales.  Chest:     Chest wall: Tenderness present.  Abdominal:     General: There is no distension.     Tenderness: There is no abdominal tenderness. There is no rebound.  Musculoskeletal:        General: No  edema. Normal range of motion.     Cervical back: Neck supple.  Lymphadenopathy:     Cervical: No cervical adenopathy.  Skin:    Findings: No erythema or rash.  Neurological:     Mental Status: She is alert and oriented to person, place, and time.     Motor: No abnormal muscle tone.     Coordination: Coordination normal.  Psychiatric:        Mood and Affect: Mood and affect normal.        Behavior: Behavior normal.     ED Results / Procedures / Treatments   Labs (all labs ordered are listed, but only abnormal results are displayed) Labs Reviewed  BASIC METABOLIC PANEL - Abnormal; Notable for the following components:      Result Value   Glucose, Bld 111 (*)    All other components within normal limits  CBC - Abnormal; Notable for the following components:   Hemoglobin 11.7 (*)    MCH 25.3 (*)    RDW 17.2 (*)    All other components within normal limits  D-DIMER, QUANTITATIVE - Abnormal; Notable for the following components:   D-Dimer, Quant 1.45 (*)    All other components within normal limits  I-STAT BETA HCG BLOOD, ED ([facility], [facility], [facility] ONLY)  TROPONIN I (HIGH SENSITIVITY)  TROPONIN I (HIGH SENSITIVITY)    EKG EKG Interpretation  Date/Time:  Wednesday August 17 2020 15:29:45 EST Ventricular Rate:  92 PR Interval:    QRS Duration: 90 QT Interval:  331 QTC Calculation: 410 R Axis:   9 Text Interpretation: Sinus rhythm Baseline wander in lead(s) V1 12 Lead;  Mason-Likar Confirmed by Milton Ferguson [id number] on 08/17/2020 4:07:40 PM   Radiology DG Chest 2 View  Result Date: 08/17/2020 CLINICAL DATA:  Chest pain EXAM: CHEST - 2 VIEW COMPARISON:  06/13/2017 FINDINGS: The heart size and mediastinal contours are within normal limits. Both lungs are clear. The visualized skeletal structures are unremarkable. IMPRESSION: No active cardiopulmonary disease. Electronically Signed   By: Donavan Foil M.D.   On: 08/17/2020 16:30   CT Angio Chest PE W and/or Wo Contrast  Result Date: 08/17/2020 CLINICAL DATA:  PE suspected, high prob Left-sided chest pain. EXAM: CT ANGIOGRAPHY CHEST WITH CONTRAST TECHNIQUE: Multidetector CT imaging of the chest was performed using the standard protocol during bolus administration of intravenous contrast. Multiplanar CT image reconstructions and MIPs were obtained to evaluate the vascular anatomy. CONTRAST:  193mL OMNIPAQUE IOHEXOL 350 MG/ML SOLN COMPARISON:  Radiograph earlier this day. FINDINGS: Cardiovascular: Examination is diagnostic to the segmental level. Subsegmental branches cannot be assessed. Basilar assessment is limited by motion. Allowing for these limitations, no evidence of pulmonary embolus. No aortic dissection or acute aortic findings. Heart is normal in size. No pericardial effusion. Mediastinum/Nodes: No enlarged mediastinal or hilar lymph nodes. Prominent but nonenlarged right subpectoral and left axillary lymph nodes, 8 and 9 mm respectively. No thyroid nodule. Tiny hiatal hernia. No esophageal wall thickening. Lungs/Pleura: No acute or focal airspace disease. No findings of pulmonary edema. No pleural fluid. No pulmonary mass. Basilar assessment partially obscured by breathing motion. Upper Abdomen: No acute findings. Cholecystectomy. Incidental note of 2 renal arteries bilaterally. Tiny hiatal hernia. Musculoskeletal: There are no acute or suspicious osseous abnormalities. Review of the MIP images confirms the above  findings. IMPRESSION: 1. No pulmonary embolus or acute intrathoracic abnormality. 2. Tiny hiatal hernia. 3. Prominent right subpectoral and left axillary lymph nodes are likely reactive. Electronically Signed   By:  Keith Rake M.D.   On: 08/17/2020 19:28    Procedures Procedures   Medications Ordered in ED Medications  oxyCODONE-acetaminophen (PERCOCET/ROXICET) 5-325 MG per tablet 1 tablet (1 tablet Oral Given 08/17/20 1659)  ondansetron (ZOFRAN) injection 4 mg (4 mg Intravenous Given 08/17/20 1701)  iohexol (OMNIPAQUE) 350 MG/ML injection 100 mL (100 mLs Intravenous Contrast Given 08/17/20 1901)    ED Course  I have reviewed the triage vital signs and the nursing notes.  Pertinent labs & imaging results that were available during my care of the patient were reviewed by me and considered in my medical decision making (see chart for details).    MDM Rules/Calculators/A&P                          Patient with noncardiac chest pain.  CT negative.  Suspect chest wall discomfort.  She is to take her Voltaren twice a day and is given Dilaudid if necessary patient will follow up with PCP Final Clinical Impression(s) / ED Diagnoses Final diagnoses:  None    Rx / DC Orders ED Discharge Orders         Ordered    HYDROmorphone (DILAUDID) 4 MG tablet  Every 6 hours PRN,   Status:  Discontinued        08/17/20 2003    HYDROmorphone (DILAUDID) 4 MG tablet  Every 6 hours PRN        08/17/20 2004           Milton Ferguson, MD 08/17/20 2011

## 2020-08-17 NOTE — Discharge Instructions (Signed)
Take your voltaren twice a day and follow up with your md next week

## 2020-08-17 NOTE — ED Provider Notes (Signed)
[facility] DEPT Provider Note   CSN: [id number] Arrival date & time: 08/17/20  1525     History Chief Complaint  Patient presents with  . Chest Pain    Shelly Cohen is a 60 y.o. female.   Chest Pain      Past Medical History:  Diagnosis Date  . Alpha thalassemia trait   . Fibroadenoma   . Glaucoma    monitored every 26months, right eye worse than left   . History of positive PPD, treatment status unknown   . Hypertension   . Thyroid disease     Patient Active Problem List   Diagnosis Date Noted  . OSA (obstructive sleep apnea) 02/09/2020  . DJD (degenerative joint disease) 02/09/2020  . Prediabetes 08/13/2019  . Primary insomnia 08/13/2019  . Status post total replacement of right hip 09/12/2018  . Obesity (BMI 30-39.9) 08/22/2018  . Trigger thumb, left thumb 05/07/2018  . OA (osteoarthritis) of hip 04/22/2018  . Open angle primary glaucoma 01/08/2018  . Thalassemia trait, alpha 06/29/2016  . Iron deficiency anemia 06/29/2016  . S/P bunionectomy 06/29/2016  . HTN (hypertension), benign 05/18/2016  . Hypothyroidism 05/18/2016    Past Surgical History:  Procedure Laterality Date  . ABDOMINAL HYSTERECTOMY  1989  . BREAST BIOPSY  2013  . BUNIONECTOMY  10/2014  . CHOLECYSTECTOMY  2017  . COLONOSCOPY    . GALLBLADDER SURGERY    . hallux limi    . LAPAROSCOPY    . TOTAL HIP ARTHROPLASTY Right 09/12/2018   Procedure: RIGHT TOTAL HIP ARTHROPLASTY ANTERIOR APPROACH;  Surgeon: Mcarthur Rossetti, MD;  Location: [facility];  Service: Orthopedics;  Laterality: Right;  . TUBAL LIGATION       OB History   No obstetric history on file.     Family History  Problem Relation Age of Onset  . Hypertension Mother   . Anemia Mother   . Arthritis Mother        rheumatoid arthritis  . Kidney disease Mother   . Cataracts Mother   . Heart disease Mother   . Hypertension Sister   . Cataracts Sister   . Hypertension Brother   .  Glaucoma Brother   . Diabetes Brother   . Cancer Maternal Grandmother        leukemia  . Cancer Maternal Grandfather        lung  . Glaucoma Maternal Aunt     Social History   Tobacco Use  . Smoking status: Never Smoker  . Smokeless tobacco: Never Used  Vaping Use  . Vaping Use: Never used  Substance Use Topics  . Alcohol use: No  . Drug use: No    Home Medications Prior to Admission medications   Medication Sig Start Date End Date Taking? Authorizing Provider  amLODipine (NORVASC) 10 MG tablet Take 1 tablet (10 mg total) by mouth daily. 02/09/20   Nche, Charlene Brooke, NP  Ascorbic Acid (VITAMIN C PO) Take 1 tablet by mouth daily.     [provider]  carvedilol (COREG) 25 MG tablet TAKE 1 TABLET BY MOUTH 2 TIMES DAILY. 12/29/19   Sueanne Margarita, MD  diclofenac (VOLTAREN) 75 MG EC tablet Take 1 tablet (75 mg total) by mouth 2 (two) times daily between meals as needed. 07/13/20   Mcarthur Rossetti, MD  docusate sodium (COLACE) 100 MG capsule Take 1 capsule (100 mg total) by mouth 2 (two) times daily. Patient taking differently: Take 100 mg by mouth 2 (  two) times daily as needed for moderate constipation.  08/07/18   Nche, Charlene Brooke, NP  eszopiclone (LUNESTA) 2 MG TABS tablet Take 1 tablet (2 mg total) by mouth at bedtime as needed for sleep. Take immediately before bedtime 11/16/19   Olalere, Adewale A, MD  HYDROmorphone (DILAUDID) 4 MG tablet Take 1 tablet (4 mg total) by mouth every 6 (six) hours as needed for severe pain. 08/17/20   Milton Ferguson, MD  latanoprost (XALATAN) 0.005 % ophthalmic solution SMARTSIG:1 Drop(s) In Eye(s) Every Evening 12/22/19   [provider]  Latanoprostene Bunod (VYZULTA) 0.024 % SOLN Apply 1 drop to eye 2 (two) times daily. 01/23/18   Nche, Charlene Brooke, NP  levothyroxine (SYNTHROID) 50 MCG tablet Take 1 tablet (50 mcg total) by mouth daily before breakfast. 02/09/20   Nche, Charlene Brooke, NP  lisinopril (ZESTRIL) 40 MG tablet  Take 1 tablet (40 mg total) by mouth daily. 08/13/19   Nche, Charlene Brooke, NP  Multiple Vitamin (MULTI-VITAMINS) TABS Take 1 tablet by mouth daily.     [provider]  Omega-3 Fatty Acids (FISH OIL PO) Take 1 capsule by mouth daily.     [provider]  spironolactone (ALDACTONE) 50 MG tablet TAKE 1 TABLET (50 MG TOTAL) BY MOUTH DAILY. 11/24/19   Sueanne Margarita, MD    Allergies    Diphenhydramine hcl, Hydrocodone, Oxycodone, Azithromycin, Cephalexin, Penicillin g, and Zolpidem  Review of Systems   Review of Systems  Cardiovascular: Positive for chest pain.    Physical Exam Updated Vital Signs BP 128/67 (BP Location: Right Arm)   Pulse 67   Temp 98 F (36.7 C) (Oral)   Resp 16   SpO2 99%   Physical Exam  ED Results / Procedures / Treatments   Labs (all labs ordered are listed, but only abnormal results are displayed) Labs Reviewed  BASIC METABOLIC PANEL - Abnormal; Notable for the following components:      Result Value   Glucose, Bld 111 (*)    All other components within normal limits  CBC - Abnormal; Notable for the following components:   Hemoglobin 11.7 (*)    MCH 25.3 (*)    RDW 17.2 (*)    All other components within normal limits  D-DIMER, QUANTITATIVE - Abnormal; Notable for the following components:   D-Dimer, Quant 1.45 (*)    All other components within normal limits  I-STAT BETA HCG BLOOD, ED ([facility], [facility], [facility] ONLY)  TROPONIN I (HIGH SENSITIVITY)  TROPONIN I (HIGH SENSITIVITY)    EKG EKG Interpretation  Date/Time:  Wednesday August 17 2020 15:29:45 EST Ventricular Rate:  92 PR Interval:    QRS Duration: 90 QT Interval:  331 QTC Calculation: 410 R Axis:   9 Text Interpretation: Sinus rhythm Baseline wander in lead(s) V1 12 Lead; Mason-Likar Confirmed by Milton Ferguson [id number] on 08/17/2020 4:07:40 PM   Radiology DG Chest 2 View  Result Date: 08/17/2020 CLINICAL DATA:  Chest pain EXAM: CHEST - 2 VIEW COMPARISON:  06/13/2017  FINDINGS: The heart size and mediastinal contours are within normal limits. Both lungs are clear. The visualized skeletal structures are unremarkable. IMPRESSION: No active cardiopulmonary disease. Electronically Signed   By: Donavan Foil M.D.   On: 08/17/2020 16:30   CT Angio Chest PE W and/or Wo Contrast  Result Date: 08/17/2020 CLINICAL DATA:  PE suspected, high prob Left-sided chest pain. EXAM: CT ANGIOGRAPHY CHEST WITH CONTRAST TECHNIQUE: Multidetector CT imaging of the chest was performed using the standard protocol  during bolus administration of intravenous contrast. Multiplanar CT image reconstructions and MIPs were obtained to evaluate the vascular anatomy. CONTRAST:  122mL OMNIPAQUE IOHEXOL 350 MG/ML SOLN COMPARISON:  Radiograph earlier this day. FINDINGS: Cardiovascular: Examination is diagnostic to the segmental level. Subsegmental branches cannot be assessed. Basilar assessment is limited by motion. Allowing for these limitations, no evidence of pulmonary embolus. No aortic dissection or acute aortic findings. Heart is normal in size. No pericardial effusion. Mediastinum/Nodes: No enlarged mediastinal or hilar lymph nodes. Prominent but nonenlarged right subpectoral and left axillary lymph nodes, 8 and 9 mm respectively. No thyroid nodule. Tiny hiatal hernia. No esophageal wall thickening. Lungs/Pleura: No acute or focal airspace disease. No findings of pulmonary edema. No pleural fluid. No pulmonary mass. Basilar assessment partially obscured by breathing motion. Upper Abdomen: No acute findings. Cholecystectomy. Incidental note of 2 renal arteries bilaterally. Tiny hiatal hernia. Musculoskeletal: There are no acute or suspicious osseous abnormalities. Review of the MIP images confirms the above findings. IMPRESSION: 1. No pulmonary embolus or acute intrathoracic abnormality. 2. Tiny hiatal hernia. 3. Prominent right subpectoral and left axillary lymph nodes are likely reactive. Electronically  Signed   By: Keith Rake M.D.   On: 08/17/2020 19:28    Procedures Procedures   Medications Ordered in ED Medications  oxyCODONE-acetaminophen (PERCOCET/ROXICET) 5-325 MG per tablet 1 tablet (1 tablet Oral Given 08/17/20 1659)  ondansetron (ZOFRAN) injection 4 mg (4 mg Intravenous Given 08/17/20 1701)  iohexol (OMNIPAQUE) 350 MG/ML injection 100 mL (100 mLs Intravenous Contrast Given 08/17/20 1901)    ED Course  I have reviewed the triage vital signs and the nursing notes.  Pertinent labs & imaging results that were available during my care of the patient were reviewed by me and considered in my medical decision making (see chart for details).    MDM Rules/Calculators/A&P                           Final Clinical Impression(s) / ED Diagnoses Final diagnoses:  None    Rx / DC Orders ED Discharge Orders         Ordered    HYDROmorphone (DILAUDID) 4 MG tablet  Every 6 hours PRN,   Status:  Discontinued        08/17/20 2003    HYDROmorphone (DILAUDID) 4 MG tablet  Every 6 hours PRN        08/17/20 2004           Milton Ferguson, MD 08/21/20 [phone]

## 2020-08-18 MED FILL — HYDROmorphone HCL 4 MG TABS: 4 | 2 days supply | Qty: 10 | Fill #0

## 2020-08-22 ENCOUNTER — Telehealth: Payer: Self-pay

## 2020-08-22 NOTE — Telephone Encounter (Signed)
   [facility] Pre-operative Risk Assessment    HEARTCARE STAFF: - Please ensure there is not already an duplicate clearance open for this procedure. - Under Visit Info/Reason for Call, type in Other and utilize the format Clearance MM/DD/YY or Clearance TBD. Do not use dashes or single digits. - If request is for dental extraction, please clarify the # of teeth to be extracted.  Request for surgical clearance:  1. What type of surgery is being performed? LAPAROSCOPIC GASTRIC SLEEVE   2. When is this surgery scheduled? TBD   3. What type of clearance is required (medical clearance vs. Pharmacy clearance to hold med vs. Both)? MEDICAL  4. Are there any medications that need to be held prior to surgery and how long? NONE   5. Practice name and name of physician performing surgery? [facility]   6. What is the office phone number? [phone]   7.   What is the office fax number? [place]: Lindwood Coke, RN  8.   Anesthesia type (None, local, MAC, general) ? GENERAL   Jacinta Shoe 08/22/2020, 4:57 PM  _________________________________________________________________   (provider comments below)

## 2020-08-23 NOTE — Telephone Encounter (Signed)
   Primary Cardiologist: Fransico Him, MD  Chart reviewed as part of pre-operative protocol coverage. Because of Shelly Cohen's past medical history and time since last visit, she will require a follow-up visit in order to better assess preoperative cardiovascular risk.  She has an appointment with Dr. Radford Pax scheduled for 08/30/20. I have updated appointment notes to say 'preop clearance'. Will route to Dr. Radford Pax so she is aware.   Preoperative clearance will be addressed at upcoming appointment. Will route note to requesting party. Will remove from preop pool.   Loel Dubonnet, NP  08/23/2020, 8:56 AM

## 2020-08-24 ENCOUNTER — Ambulatory Visit (INDEPENDENT_AMBULATORY_CARE_PROVIDER_SITE_OTHER): Payer: No Typology Code available for payment source | Admitting: Rehabilitative and Restorative Service Providers"

## 2020-08-24 ENCOUNTER — Encounter: Payer: Self-pay | Admitting: Rehabilitative and Restorative Service Providers"

## 2020-08-24 ENCOUNTER — Other Ambulatory Visit: Payer: Self-pay

## 2020-08-24 DIAGNOSIS — M25651 Stiffness of right hip, not elsewhere classified: Secondary | ICD-10-CM

## 2020-08-24 DIAGNOSIS — R262 Difficulty in walking, not elsewhere classified: Secondary | ICD-10-CM | POA: Diagnosis not present

## 2020-08-24 DIAGNOSIS — M25551 Pain in right hip: Secondary | ICD-10-CM

## 2020-08-24 DIAGNOSIS — M6281 Muscle weakness (generalized): Secondary | ICD-10-CM | POA: Diagnosis not present

## 2020-08-24 NOTE — Patient Instructions (Signed)
Access Code: [email or web address] URL: [email or web address] Date: 08/24/2020 Prepared by: Vista Mink  Exercises Standing Lumbar Extension at [place] - 2 x daily - 7 x weekly - 1-2 sets - 10 reps Frogger - 2 x daily - 7 x weekly - 1-2 sets - 10 reps ITB Stretch at [place] - 2 x daily - 7 x weekly - 1 sets - 3 reps - 20-30 hold Standing Hip Internal Rotation Stretch on Step - 2 x daily - 7 x weekly - 10 reps - 15-30 hold Supine Bridge - 2 x daily - 7 x weekly - 1-2 sets - 10 reps - 5 hold Modified [place] Stretch - 2 x daily - 7 x weekly - 5 reps - 20 seconds hold Single Knee to Chest Stretch - 2 x daily - 7 x weekly - 1 sets - 5 reps - 20 seconds hold Supine ITB Stretch with Strap - 2 x daily - 7 x weekly - 1 sets - 5 reps - 20 seconds hold Supine Figure 4 Piriformis Stretch - 2 x daily - 7 x weekly - 1 sets - 5 reps - 20 seconds hold

## 2020-08-24 NOTE — Therapy (Signed)
[facility] [address] [place], [place], [address] Phone: [phone]   Fax:  [phone]  Physical Therapy Treatment  Patient Details  Name: Shelly Cohen MRN: [id number] Date of Birth: 07-14-1960 Referring Provider (PT): Jean Rosenthal, MD   Encounter Date: 08/24/2020   PT End of Session - 08/24/20 0942    Visit Number 3    Number of Visits 8    Date for PT Re-Evaluation 10/06/20    PT Start Time 0841    PT Stop Time 0934    PT Time Calculation (min) 53 min    Activity Tolerance Patient tolerated treatment well;No increased pain    Behavior During Therapy WFL for tasks assessed/performed           Past Medical History:  Diagnosis Date  . Alpha thalassemia trait   . Fibroadenoma   . Glaucoma    monitored every 71months, right eye worse than left   . History of positive PPD, treatment status unknown   . Hypertension   . Thyroid disease     Past Surgical History:  Procedure Laterality Date  . ABDOMINAL HYSTERECTOMY  1989  . BREAST BIOPSY  2013  . BUNIONECTOMY  10/2014  . CHOLECYSTECTOMY  2017  . COLONOSCOPY    . GALLBLADDER SURGERY    . hallux limi    . LAPAROSCOPY    . TOTAL HIP ARTHROPLASTY Right 09/12/2018   Procedure: RIGHT TOTAL HIP ARTHROPLASTY ANTERIOR APPROACH;  Surgeon: Mcarthur Rossetti, MD;  Location: [facility];  Service: Orthopedics;  Laterality: Right;  . TUBAL LIGATION      There were no vitals filed for this visit.   Subjective Assessment - 08/24/20 0940    Subjective Shelly Cohen reports 1-2X/day HEP compliance.  Prolonged sitting and walking > 1/4 mile are functionally limiting.    Pertinent History HTN    Limitations Sitting;Standing;Walking;House hold activities    How long can you sit comfortably? 10-15 min    How long can you stand comfortably? 10-20 mins    How long can you walk comfortably? 10 mins    Diagnostic tests Xray: 01/05/2020  A low AP pelvis and lateral of the right hip shows a  well-seated total hip arthroplasty with no complicating features.  The left hip only shows some mild joint space narrowing but otherwise no acute findings    Currently in Pain? No/denies    Pain Onset More than a month ago                             [facility] Adult PT Treatment/Exercise - 08/24/20 0001      Neuro Re-ed    Neuro Re-ed Details  Heel to toe balance 5X 20 seconds (3X eyes open, 1X head moving and 1X eyes closed)      Exercises   Exercises Knee/Hip      Knee/Hip Exercises: Stretches   Hip Flexor Stretch Right;5 reps;20 seconds    Hip Flexor Stretch Limitations Knee to chest other leg straight and one leg off table 4X each    ITB Stretch Both;5 reps;20 seconds;Other (comment)    ITB Stretch Limitations Supine with belt    Piriformis Stretch Both;5 reps;20 seconds    Piriformis Stretch Limitations Figure 4 with self-overpressure    Other Knee/Hip Stretches Trunk rotation AROM 3X 30 seconds      Knee/Hip Exercises: Standing   Other Standing Knee Exercises Hip hike with pelvic stabilization 10X 3 seconds  in doorway (do not lean)    Other Standing Knee Exercises Trunk extension AROM 10X 3 seconds                  PT Education - 08/24/20 0940    Education Details Reviewed and made modifications (patient choice) to HEP.  Reinforced the importance of 2X/day HEP compliance.    Person(s) Educated Patient    Methods Explanation;Demonstration;Verbal cues;Handout    Comprehension Returned demonstration;Need further instruction;Verbal cues required;Verbalized understanding            PT Short Term Goals - 08/24/20 0941      PT SHORT TERM GOAL #1   Title Patient will be I with HEP    Status On-going             PT Long Term Goals - 08/24/20 0941      PT LONG TERM GOAL #1   Title Patient will improve FOTO score    Baseline baseline functional outcome score 57%, 69 predicted    Time 8    Period Weeks    Status On-going      PT LONG TERM  GOAL #2   Title Patient will show improved Hip ROM without increased pain 3/10    Status On-going      PT LONG TERM GOAL #3   Title Patient will be able to sleep through the night 50% better with decreased interruptions from pain    Time 8    Period Weeks    Status On-going      PT LONG TERM GOAL #4   Title Patient will demonstrate the ability to tolerate standing and sitting for at least 30 minutes without pain increasing over 3/10    Baseline can only sit and stand for 10-15 minutes before chanigng position    Time 8    Period Weeks    Status On-going                 Plan - 08/24/20 0942    Clinical Impression Statement Shelly Cohen reports 1-2X/day HEP compliance.  I reinforced the benefits of 2X/day compliance.  Modifications were made to her stretches to make compliance easier (combination of standing and supine).  She reports feeling less stiff after stretching and I reminded her repeated compliance will be necessary for long-term benefits.  Continue to correct and modify her HEP as needed to meet long-term goals.    Personal Factors and Comorbidities Comorbidity 1    Comorbidities Lt hip arthritis    Examination-Activity Limitations Locomotion Level;Sleep;Squat;Stand;Bend    Examination-Participation Restrictions Occupation    Stability/Clinical Decision Making Evolving/Moderate complexity    Rehab Potential Fair    PT Frequency 1x / week    PT Duration 8 weeks    PT Treatment/Interventions ADLs/Self Care Home Management;Aquatic Therapy;Biofeedback;Cryotherapy;Electrical Stimulation;Ultrasound;Traction;Moist Heat;Iontophoresis 4mg /ml Dexamethasone;Gait training;Stair training;Functional mobility training;Therapeutic activities;Therapeutic exercise;Balance training;Neuromuscular re-education;Manual techniques;Passive range of motion;Dry needling;Taping;Joint Manipulations    PT Next Visit Plan Review HEP.  Progress hip strength and balance as needed.    PT Home Exercise Plan  Access Code: [email or web address]    Consulted and Agree with Plan of Care Patient           Patient will benefit from skilled therapeutic intervention in order to improve the following deficits and impairments:  Decreased range of motion,Difficulty walking,Abnormal gait,Decreased endurance,Decreased activity tolerance,Pain,Decreased balance,Decreased mobility,Decreased strength,Impaired flexibility  Visit Diagnosis: Difficulty in walking, not elsewhere classified  Stiffness of right hip, not elsewhere classified  Pain in right  hip  Muscle weakness (generalized)     Problem List Patient Active Problem List   Diagnosis Date Noted  . OSA (obstructive sleep apnea) 02/09/2020  . DJD (degenerative joint disease) 02/09/2020  . Prediabetes 08/13/2019  . Primary insomnia 08/13/2019  . Status post total replacement of right hip 09/12/2018  . Obesity (BMI 30-39.9) 08/22/2018  . Trigger thumb, left thumb 05/07/2018  . OA (osteoarthritis) of hip 04/22/2018  . Open angle primary glaucoma 01/08/2018  . Thalassemia trait, alpha 06/29/2016  . Iron deficiency anemia 06/29/2016  . S/P bunionectomy 06/29/2016  . HTN (hypertension), benign 05/18/2016  . Hypothyroidism 05/18/2016    Farley Ly PT, MPT 08/24/2020, 9:45 AM  [facility] Physical Therapy [address] [place], [place], [address] Phone: [phone]   Fax:  [phone]  Name: Shelly Cohen MRN: [id number] Date of Birth: 1960/10/17

## 2020-08-25 ENCOUNTER — Other Ambulatory Visit: Payer: Self-pay | Admitting: General Surgery

## 2020-08-26 ENCOUNTER — Ambulatory Visit: Payer: No Typology Code available for payment source | Admitting: Nurse Practitioner

## 2020-08-30 ENCOUNTER — Other Ambulatory Visit: Payer: Self-pay

## 2020-08-30 ENCOUNTER — Ambulatory Visit (INDEPENDENT_AMBULATORY_CARE_PROVIDER_SITE_OTHER): Payer: No Typology Code available for payment source | Admitting: Cardiology

## 2020-08-30 ENCOUNTER — Encounter: Payer: Self-pay | Admitting: Cardiology

## 2020-08-30 VITALS — BP 124/62 | HR 81 | Ht 62.0 in | Wt 218.0 lb

## 2020-08-30 DIAGNOSIS — I10 Essential (primary) hypertension: Secondary | ICD-10-CM

## 2020-08-30 DIAGNOSIS — E669 Obesity, unspecified: Secondary | ICD-10-CM

## 2020-08-30 DIAGNOSIS — Z01818 Encounter for other preprocedural examination: Secondary | ICD-10-CM

## 2020-08-30 DIAGNOSIS — G4733 Obstructive sleep apnea (adult) (pediatric): Secondary | ICD-10-CM | POA: Diagnosis not present

## 2020-08-30 NOTE — Patient Instructions (Signed)

## 2020-08-30 NOTE — Progress Notes (Addendum)
Cardiology Office Note:    Date:  08/30/2020   ID:  Shelly Cohen, DOB 08-Apr-1961, MRN [id number]  PCP:  Flossie Buffy, NP  Cardiologist:  Fransico Him, MD    Referring MD: Flossie Buffy, NP   Chief Complaint  Patient presents with  . Hypertension    History of Present Illness:    Shelly Cohen is a 60 y.o. female with a hx of poorly controlled HTN and thyroid disease.  She had been on Lisinopril 20mg  daily, amlodipine 2.5mg  daily and Toprol XL 100mg  daily.  She did not tolerate diuretics in the past due to hypokalemia.  Renal artery duplex was normal. Aldosterone and Aldo PRA ratio were normal.  Plasma catecholamine levels were normal but no urine catecholamines were done.  It was felt that she had essential hypertension.  2D echo showed normal LVF with EF 60-65% with mild LVH.  She was in the ER 08/17/2020 with atypical chest pain that was worse with movement and inspiration and nonradiating and nonexertional and dx with MSK chest wall pain and resolved on Voltaren.   She is here today for followup and is doing well.  She denies any further chest pain or pressure and SOB, DOE, PND, orthopnea, LE edema, dizziness, palpitations or syncope. She is going to have gastric sleeve surgery and needs preop clearance.  She is compliant with her meds and is tolerating meds with no SE.    Past Medical History:  Diagnosis Date  . Alpha thalassemia trait   . Fibroadenoma   . Glaucoma    monitored every 62months, right eye worse than left   . History of positive PPD, treatment status unknown   . Hypertension   . OSA (obstructive sleep apnea)    mild with AHI 8/hr  . Thyroid disease     Past Surgical History:  Procedure Laterality Date  . ABDOMINAL HYSTERECTOMY  1989  . BREAST BIOPSY  2013  . BUNIONECTOMY  10/2014  . CHOLECYSTECTOMY  2017  . COLONOSCOPY    . GALLBLADDER SURGERY    . hallux limi    . LAPAROSCOPY    . TOTAL HIP ARTHROPLASTY Right 09/12/2018   Procedure:  RIGHT TOTAL HIP ARTHROPLASTY ANTERIOR APPROACH;  Surgeon: Mcarthur Rossetti, MD;  Location: [facility];  Service: Orthopedics;  Laterality: Right;  . TUBAL LIGATION      Current Medications: Current Meds  Medication Sig  . amLODipine (NORVASC) 10 MG tablet Take 1 tablet (10 mg total) by mouth daily.  . Ascorbic Acid (VITAMIN C PO) Take 1 tablet by mouth daily.   . carvedilol (COREG) 25 MG tablet TAKE 1 TABLET BY MOUTH 2 TIMES DAILY.  Marland Kitchen diclofenac (VOLTAREN) 75 MG EC tablet Take 1 tablet (75 mg total) by mouth 2 (two) times daily between meals as needed.  . docusate sodium (COLACE) 100 MG capsule Take 1 capsule (100 mg total) by mouth 2 (two) times daily. (Patient taking differently: Take 100 mg by mouth 2 (two) times daily as needed for moderate constipation.)  . eszopiclone (LUNESTA) 2 MG TABS tablet Take 1 tablet (2 mg total) by mouth at bedtime as needed for sleep. Take immediately before bedtime  . latanoprost (XALATAN) 0.005 % ophthalmic solution SMARTSIG:1 Drop(s) In Eye(s) Every Evening  . Latanoprostene Bunod 0.024 % SOLN Apply 1 drop to eye 2 (two) times daily.  Marland Kitchen levothyroxine (SYNTHROID) 50 MCG tablet Take 1 tablet (50 mcg total) by mouth daily before breakfast.  . lisinopril (ZESTRIL) 40 MG tablet  Take 1 tablet (40 mg total) by mouth daily.  . Multiple Vitamin (MULTI-VITAMINS) TABS Take 1 tablet by mouth daily.   . Omega-3 Fatty Acids (FISH OIL PO) Take 1 capsule by mouth daily.   Marland Kitchen spironolactone (ALDACTONE) 50 MG tablet TAKE 1 TABLET (50 MG TOTAL) BY MOUTH DAILY.     Allergies:   Dilaudid [hydromorphone], Diphenhydramine hcl, Hydrocodone, Oxycodone, Azithromycin, Cephalexin, Penicillin g, and Zolpidem   Social History   Socioeconomic History  . Marital status: Married    Spouse name: Not on file  . Number of children: Not on file  . Years of education: Not on file  . Highest education level: Not on file  Occupational History  . Not on file  Tobacco Use  . Smoking  status: Never Smoker  . Smokeless tobacco: Never Used  Vaping Use  . Vaping Use: Never used  Substance and Sexual Activity  . Alcohol use: No  . Drug use: No  . Sexual activity: Yes    Birth control/protection: Surgical  Other Topics Concern  . Not on file  Social History Narrative  . Not on file   Social Determinants of Health   Financial Resource Strain: Not on file  Food Insecurity: Not on file  Transportation Needs: Not on file  Physical Activity: Not on file  Stress: Not on file  Social Connections: Not on file     Family History: The patient's family history includes Anemia in her mother; Arthritis in her mother; Cancer in her maternal grandfather and maternal grandmother; Cataracts in her mother and sister; Diabetes in her brother; Glaucoma in her brother and maternal aunt; Heart disease in her mother; Hypertension in her brother, mother, and sister; Kidney disease in her mother.  ROS:   Please see the history of present illness.    ROS  All other systems reviewed and negative.   EKGs/Labs/Other Studies Reviewed:    The following studies were reviewed today: 2D Echo  EKG:  EKG is not ordered today.   Recent Labs: 02/08/2020: ALT 10; TSH 2.72 08/17/2020: BUN 16; Creatinine, Ser 0.94; Hemoglobin 11.7; Platelets 341; Potassium 4.4; Sodium 142   Recent Lipid Panel    Component Value Date/Time   CHOL 174 02/08/2020 0903   TRIG 82.0 02/08/2020 0903   HDL 57.30 02/08/2020 0903   CHOLHDL 3 02/08/2020 0903   VLDL 16.4 02/08/2020 0903   LDLCALC 100 (H) 02/08/2020 0903    Physical Exam:    VS:  BP 124/62   Pulse 81   Ht 5\' 2"  (1.575 m)   Wt 218 lb (98.9 kg)   SpO2 98%   BMI 39.87 kg/m     Wt Readings from Last 3 Encounters:  08/30/20 218 lb (98.9 kg)  02/08/20 215 lb (97.5 kg)  11/04/19 194 lb (88 kg)     GEN: Well nourished, well developed in no acute distress HEENT: Normal NECK: No JVD; No carotid bruits LYMPHATICS: No lymphadenopathy CARDIAC:RRR,  no murmurs, rubs, gallops RESPIRATORY:  Clear to auscultation without rales, wheezing or rhonchi  ABDOMEN: Soft, non-tender, non-distended MUSCULOSKELETAL:  No edema; No deformity  SKIN: Warm and dry NEUROLOGIC:  Alert and oriented x 3 PSYCHIATRIC:  Normal affect    ASSESSMENT:    1. HTN (hypertension), benign   2. Obesity (BMI 30-39.9)   3. Preoperative clearance   4. OSA (obstructive sleep apnea)    PLAN:    In order of problems listed above:  1.  HTN -BP is well controlled on  exam today -continue amlodipine 10mg  daily, Carvedilol 25mg  BID, Lisinopril 40mg  daily and spiro 50mg  daily.  -labs reviewed from PCP on [facility] showing stable SCr of 0.94 and K+ 4.4 in Feb 2022. -home sleep study was  Done showing mild OSA with an AHI of 8/hr but she chose not to proceed with treatment  2.  Obesity -I have encouraged her to get into a routine exercise program and cut back on carbs and portions.   3.  Cardiac clearance -she is going to have gastric sleeve surgery done -she has not had any anginal symptoms.  She did have some atypical CP a few weeks ago that was consistent with musculoskeletal chest wall pain -she is able to complete 6 mets of activity with no symptoms and therefore no further noninvasive stress testing is indicated at this time Her Functional Capacity in METs is: 6.05 perioperative risk of major cardiac events is 0.9% according to the Duke Activity Status Index (DASI).,  4.  OSA -mild by HST with AHI 8/hr -she is going to have gastric bypass and hopes weight loss will have a major impact so she is not proceeding with oral device or CPAP at this time   Medication Adjustments/Labs and Tests Ordered: Current medicines are reviewed at length with the patient today.  Concerns regarding medicines are outlined above.  No orders of the defined types were placed in this encounter.  No orders of the defined types were placed in this encounter.   Signed, Fransico Him, MD   08/30/2020 8:39 AM    [facility]

## 2020-08-31 ENCOUNTER — Ambulatory Visit
Admission: RE | Admit: 2020-08-31 | Discharge: 2020-08-31 | Disposition: A | Discharge status: Home / Self Care | Payer: No Typology Code available for payment source | Source: Ambulatory Visit | Attending: General Surgery | Admitting: General Surgery

## 2020-08-31 ENCOUNTER — Ambulatory Visit (INDEPENDENT_AMBULATORY_CARE_PROVIDER_SITE_OTHER): Payer: No Typology Code available for payment source | Admitting: Nurse Practitioner

## 2020-08-31 ENCOUNTER — Other Ambulatory Visit: Payer: Self-pay | Admitting: General Surgery

## 2020-08-31 ENCOUNTER — Encounter: Payer: Self-pay | Admitting: Nurse Practitioner

## 2020-08-31 ENCOUNTER — Other Ambulatory Visit: Payer: Self-pay | Admitting: Nurse Practitioner

## 2020-08-31 VITALS — BP 122/80 | HR 86 | Temp 96.7°F | Ht 62.0 in | Wt 217.2 lb

## 2020-08-31 DIAGNOSIS — M940 Chondrocostal junction syndrome [Tietze]: Secondary | ICD-10-CM | POA: Diagnosis not present

## 2020-08-31 MED ORDER — NAPROXEN 500 MG PO TABS: 500.0000 mg | ORAL_TABLET | Freq: Two times a day (BID) | ORAL | 0 refills | Status: DC

## 2020-08-31 MED FILL — NAPROXEN 500 MG TABS: 500 | 7 days supply | Qty: 14 | Fill #0

## 2020-08-31 NOTE — Patient Instructions (Signed)
You are due for mammogram in May.  Apply cold compress 2-3x/day, 45mins at a time.  Costochondritis  Costochondritis is irritation and swelling (inflammation) of the tissue that connects the ribs to the breastbone (sternum). This tissue is called cartilage. Costochondritis causes pain in the front of the chest. Usually, the pain:  Starts slowly.  Is in more than one rib. What are the causes? The exact cause of this condition is not always known. It results from stress on the tissue in the affected area. The cause of this stress could be:  Chest injury.  Exercise or activity, such as lifting.  Very bad coughing. What increases the risk? You are more likely to develop this condition if you:  Are female.  Are 9-66 years old.  Recently started a new exercise or work activity.  Have low levels of vitamin D.  Have a condition that makes you cough often. What are the signs or symptoms? The main symptom of this condition is chest pain. The pain:  Usually starts slowly and can be sharp or dull.  Gets worse with deep breathing, coughing, or exercise.  Gets better with rest.  May be worse when you press on the affected area of your ribs and breastbone. How is this treated? This condition usually goes away on its own over time. Your doctor may prescribe an NSAID, such as ibuprofen. This can help reduce pain and inflammation. Treatment may also include:  Resting and avoiding activities that make pain worse.  Putting heat or ice on the painful area.  Doing exercises to stretch your chest muscles. If these treatments do not help, your doctor may inject a numbing medicine to help relieve the pain. Follow these instructions at home: Managing pain, stiffness, and swelling  If told, put ice on the painful area. To do this: ? Put ice in a plastic bag. ? Place a towel between your skin and the bag. ? Leave the ice on for 20 minutes, 2-3 times a day.  If told, put heat on the  affected area. Do this as often as told by your doctor. Use the heat source that your doctor recommends, such as a moist heat pack or a heating pad. ? Place a towel between your skin and the heat source. ? Leave the heat on for 20-30 minutes. ? Take off the heat if your skin turns bright red. This is very important if you cannot feel pain, heat, or cold. You may have a greater risk of getting burned.      Activity  Rest as told by your doctor.  Do not do anything that makes your pain worse. This includes any activities that use chest, belly (abdomen), and side muscles.  Do not lift anything that is heavier than 10 lb (4.5 kg), or the limit that you are told, until your doctor says that it is safe.  Return to your normal activities as told by your doctor. Ask your doctor what activities are safe for you. General instructions  Take over-the-counter and prescription medicines only as told by your doctor.  Keep all follow-up visits as told by your doctor. This is important. Contact a doctor if:  You have chills or a fever.  Your pain does not go away or it gets worse.  You have a cough that does not go away. Get help right away if:  You are short of breath.  You have very bad chest pain that is not helped by medicines, heat, or ice. These  symptoms may be an emergency. Do not wait to see if the symptoms will go away. Get medical help right away. Call your local emergency services (911 in the [place] Do not drive yourself to the hospital. Summary  Costochondritis is irritation and swelling (inflammation) of the tissue that connects the ribs to the breastbone (sternum).  This condition causes pain in the front of the chest.  Treatment may include medicines, rest, heat or ice, and exercises. This information is not intended to replace advice given to you by your health care provider. Make sure you discuss any questions you have with your health care provider. Document Revised:  05/01/2019 Document Reviewed: 05/01/2019 Elsevier Patient Education  2021 [employer].   Pectoralis Major Tear Rehab Ask your health care provider which exercises are safe for you. Do exercises exactly as told by your health care provider and adjust them as directed. It is normal to feel mild stretching, pulling, tightness, or discomfort as you do these exercises. Stop right away if you feel sudden pain or your pain gets worse. Do not begin these exercises until told by your health care provider. Stretching and range-of-motion exercises These exercises warm up your muscles and joints and improve the movement and flexibility of your shoulder. These exercises can also help to relieve pain, numbness, and tingling. Pendulum This is a shoulder exercise in which you let the injured arm dangle toward the floor and then swing it like a clock pendulum. 1. Stand near a wall or a surface that you can hold onto for balance. 2. Bend at the waist and let your left / right arm hang straight down. Use your other arm to keep your balance. 3. Relax your arm and shoulder muscles, and move your hips and your trunk so your left / right arm swings freely. Your arm should swing because of the motion of your body, not because you are using your arm or shoulder muscles. 4. Keep moving your hips and trunk so your arm swings in the following directions, as told by your health care provider: ? Side to side. ? Forward and backward. ? In clockwise and counterclockwise circles. 5. Slowly return to the starting position. Repeat __________ times. Complete this exercise __________ times a day.   Standing shoulder abduction, passive In this exercise, the injured shoulder relaxes (passive) while you use the healthy arm to push it away from your body (abduction). 1. Stand and hold a broomstick, a cane, or a similar object. Place your hands a little more than shoulder width apart on the object. Your left / right hand should be  palm-up, and your other hand should be palm-down. 2. While keeping your elbow straight and your shoulder muscles relaxed, push the stick across your body toward your left / right side. Raise your left / right arm to the side of your body and then over your head until you feel a stretch in your shoulder. ? Stop when you reach the angle that is recommended by your health care provider. ? Avoid shrugging your shoulder while you raise your arm. Keep your shoulder blade tucked down toward the middle of your spine. 3. Hold for __________ seconds. 4. Slowly return to the starting position. Repeat __________ times. Complete this exercise __________ times a day. Supine wand shoulder flexion, passive In this exercise, the injured shoulder relaxes (passive) while you use the healthy arm to move it (flexion). 1. Lie on your back (supine position). You may bend your knees for comfort. 2. Hold  a broomstick, a cane, or a similar object so that your hands are about shoulder width apart on the object. Your palms should face toward your feet. 3. Raise your left / right arm in front of your face, then behind your head (toward the floor). Use your other hand to help you do this. Stop when you feel a gentle stretch in your shoulder, or when you reach the angle that is recommended by your health care provider. 4. Hold for __________ seconds. 5. Use the stick and your other arm to help you return your left / right arm to the starting position. Repeat __________ times. Complete this exercise __________ times a day.   Wand shoulder external rotation, passive In this exercise, the injured shoulder relaxes (passive) while you use the healthy arm to push it away to your side (external rotation). 1. Stand and hold a broomstick, a cane, or a similar object so your hands are about shoulder width apart on the object. 2. Start with your arms hanging down, then bend both elbows to a 90-degree angle (right angle). 3. Keep your left  / right elbow at your side. Use your other hand to push the stick so your left / right forearm moves away from your body, out to your side. ? Keep your left / right elbow bent to 90 degrees and keep it against your side. ? Stop when you feel a gentle stretch in your shoulder, or when you reach the angle recommended by your health care provider. 4. Hold for __________ seconds. 5. Use the stick to help you return your left / right arm to the starting position. Repeat __________ times. Complete this exercise __________ times a day. Strengthening exercises These exercises build strength and endurance in your shoulder. Endurance is the ability to use your muscles for a long time, even after your muscles get tired. Scapular protraction, standing 1. Stand so you are facing a wall. Place your feet about one arm-length away from the wall. 2. Place your hands on the wall and straighten your elbows. 3. Keep your hands on the wall as you push your upper back away from the wall. You should feel your shoulder blades (scapulae) sliding forward (protraction) around your rib cage. Keep your elbows and your head still. ? If you are not sure that you are doing this exercise correctly, ask your health care provider for more instructions. 4. Hold for __________ seconds. 5. Slowly return to the starting position. Let your muscles relax completely before you repeat this exercise. Repeat __________ times. Complete this exercise __________ times a day.   Scapular retraction, seated This exercise is also called shoulder blade squeezes. 1. Sit with good posture in a stable chair without armrests. Do not let your back touch the back of the chair. 2. Your arms should be at your sides with your elbows bent. You may rest your forearms on a pillow if that is more comfortable. 3. Squeeze your shoulder blades (scapulae) together. Bring them down and back (retraction). ? Keep your shoulders level. ? Do not lift your shoulders up  toward your ears. 4. Hold for __________ seconds. 5. Return to the starting position. Repeat __________ times. Complete this exercise __________ times a day. This information is not intended to replace advice given to you by your health care provider. Make sure you discuss any questions you have with your health care provider. Document Revised: 10/09/2018 Document Reviewed: 06/16/2018 Elsevier Patient Education  2021 [employer].

## 2020-08-31 NOTE — Progress Notes (Signed)
Subjective:  Patient ID: Shelly Cohen, female    DOB: 04-13-1961  Age: 60 y.o. MRN: [id number]  CC: Hospitalization Follow-up [facility] f/u from 08/18/20 for chest pain. Pt has since been to cardiology and states she has been doing better. )  HPI [name] was evaluated in ED for chest pain. Cardiac work-up was negative. She denies any fever or SOB or nigh sweats or weight loss or cough or rash. She denies any chest wall injury. Her pain is worse with laying on her side. Improves with sitting up and laying on her back. Reviewed ED notes, lab results and radiology report.  Reviewed past Medical, Social and Family history today.  Outpatient Medications Prior to Visit  Medication Sig Dispense Refill  . amLODipine (NORVASC) 10 MG tablet Take 1 tablet (10 mg total) by mouth daily. 90 tablet 3  . Ascorbic Acid (VITAMIN C PO) Take 1 tablet by mouth daily.     . carvedilol (COREG) 25 MG tablet TAKE 1 TABLET BY MOUTH 2 TIMES DAILY. 180 tablet 2  . diclofenac (VOLTAREN) 75 MG EC tablet Take 1 tablet (75 mg total) by mouth 2 (two) times daily between meals as needed. 60 tablet 1  . docusate sodium (COLACE) 100 MG capsule Take 1 capsule (100 mg total) by mouth 2 (two) times daily. (Patient taking differently: Take 100 mg by mouth 2 (two) times daily as needed for moderate constipation.) 10 capsule 0  . latanoprost (XALATAN) 0.005 % ophthalmic solution SMARTSIG:1 Drop(s) In Eye(s) Every Evening    . Latanoprostene Bunod 0.024 % SOLN Apply 1 drop to eye 2 (two) times daily.    Marland Kitchen levothyroxine (SYNTHROID) 50 MCG tablet Take 1 tablet (50 mcg total) by mouth daily before breakfast. 90 tablet 1  . lisinopril (ZESTRIL) 40 MG tablet Take 1 tablet (40 mg total) by mouth daily. 90 tablet 3  . Multiple Vitamin (MULTI-VITAMINS) TABS Take 1 tablet by mouth daily.     . Omega-3 Fatty Acids (FISH OIL PO) Take 1 capsule by mouth daily.     Marland Kitchen spironolactone (ALDACTONE) 50 MG tablet TAKE 1 TABLET (50 MG TOTAL) BY  MOUTH DAILY. 90 tablet 2  . eszopiclone (LUNESTA) 2 MG TABS tablet Take 1 tablet (2 mg total) by mouth at bedtime as needed for sleep. Take immediately before bedtime (Patient not taking: Reported on 08/31/2020) 30 tablet 1   No facility-administered medications prior to visit.    ROS See HPI  Objective:  BP 122/80 (BP Location: Left Arm, Patient Position: Sitting, Cuff Size: Large)   Pulse 86   Temp (!) 96.7 F (35.9 C) (Temporal)   Ht 5\' 2"  (1.575 m)   Wt 217 lb 3.2 oz (98.5 kg)   SpO2 98%   BMI 39.73 kg/m   Physical Exam Vitals reviewed.  Cardiovascular:     Rate and Rhythm: Normal rate and regular rhythm.     Pulses: Normal pulses.     Heart sounds: Normal heart sounds.  Pulmonary:     Effort: Pulmonary effort is normal.     Breath sounds: Normal breath sounds.  Chest:     Chest wall: Tenderness present.  Skin:    Findings: No erythema or rash.  Neurological:     Mental Status: She is oriented to person, place, and time.     Assessment & Plan:  This visit occurred during the SARS-CoV-2 public health emergency.  Safety protocols were in place, including screening questions prior to the visit, additional usage of  staff PPE, and extensive cleaning of exam room while observing appropriate contact time as indicated for disinfecting solutions.   [name] was seen today for hospitalization follow-up.  Diagnoses and all orders for this visit:  Costochondritis, acute -     naproxen (NAPROSYN) 500 MG tablet; Take 1 tablet (500 mg total) by mouth 2 (two) times daily with a meal.  Apply cold compress 2-3x/day, 26mins at a time.  Problem List Items Addressed This Visit   None   Visit Diagnoses    Costochondritis, acute    -  Primary   Relevant Medications   naproxen (NAPROSYN) 500 MG tablet      Follow-up: No follow-ups on file.  Wilfred Lacy, NP

## 2020-09-01 ENCOUNTER — Other Ambulatory Visit: Payer: Self-pay

## 2020-09-01 ENCOUNTER — Ambulatory Visit (INDEPENDENT_AMBULATORY_CARE_PROVIDER_SITE_OTHER): Payer: No Typology Code available for payment source | Admitting: Physical Therapy

## 2020-09-01 DIAGNOSIS — R262 Difficulty in walking, not elsewhere classified: Secondary | ICD-10-CM | POA: Diagnosis not present

## 2020-09-01 DIAGNOSIS — M25551 Pain in right hip: Secondary | ICD-10-CM

## 2020-09-01 DIAGNOSIS — M25651 Stiffness of right hip, not elsewhere classified: Secondary | ICD-10-CM

## 2020-09-01 DIAGNOSIS — M6281 Muscle weakness (generalized): Secondary | ICD-10-CM

## 2020-09-01 NOTE — Therapy (Signed)
[facility] [address] [place], [place], [address] Phone: [phone]   Fax:  [phone]  Physical Therapy Treatment  Patient Details  Name: Shelly Cohen MRN: [id number] Date of Birth: 06/17/1961 Referring Provider (PT): Christopher Blackman, MD   Encounter Date: 09/01/2020   PT End of Session - 09/01/20 1647    Visit Number 4    Number of Visits 8    Date for PT Re-Evaluation 10/06/20    PT Start Time 1518    PT Stop Time 1559    PT Time Calculation (min) 41 min    Activity Tolerance Patient tolerated treatment well;No increased pain    Behavior During Therapy WFL for tasks assessed/performed           Past Medical History:  Diagnosis Date  . Alpha thalassemia trait   . Fibroadenoma   . Glaucoma    monitored every 6months, right eye worse than left   . History of positive PPD, treatment status unknown   . Hypertension   . OSA (obstructive sleep apnea)    mild with AHI 8/hr  . Thyroid disease     Past Surgical History:  Procedure Laterality Date  . ABDOMINAL HYSTERECTOMY  1989  . BREAST BIOPSY  2013  . BUNIONECTOMY  10/2014  . CHOLECYSTECTOMY  2017  . COLONOSCOPY    . GALLBLADDER SURGERY    . hallux limi    . LAPAROSCOPY    . TOTAL HIP ARTHROPLASTY Right 09/12/2018   Procedure: RIGHT TOTAL HIP ARTHROPLASTY ANTERIOR APPROACH;  Surgeon: Blackman, Christopher Y, MD;  Location: [facility];  Service: Orthopedics;  Laterality: Right;  . TUBAL LIGATION      There were no vitals filed for this visit.   Subjective Assessment - 09/01/20 1531    Subjective Patient reports no pain today. She has been able to stand for longer and walking and also reports sleeping much better.    Pertinent History HTN    Limitations Sitting;Standing;Walking;House hold activities    How long can you sit comfortably? 10-15 min    How long can you stand comfortably? 30 mins or more    How long can you walk comfortably? 10 mins    Diagnostic tests  Xray: 01/05/2020  A low AP pelvis and lateral of the right hip shows a well-seated total hip arthroplasty with no complicating features.  The left hip only shows some mild joint space narrowing but otherwise no acute findings    Currently in Pain? No/denies    Pain Score 0-No pain    Pain Onset More than a month ago            [facility] Adult PT Treatment/Exercise - 09/01/20 0001      Knee/Hip Exercises: Stretches   Piriformis Stretch 3 reps;30 seconds;Right    Piriformis Stretch Limitations Figure 4 with self-overpressure    Other Knee/Hip Stretches trunk rotation 5 sec hold 10x each side      Knee/Hip Exercises: Aerobic   Nustep L7 x 6min LE only      Knee/Hip Exercises: Machines for Strengthening   Cybex Leg Press 2x10 #100, SL #56 3x10      Knee/Hip Exercises: Seated   Other Seated Knee/Hip Exercises Red band around ankles with ball squeeze between knees 10x 5 sec hold, Yellow ball between ankles red band around knees 10x 5 sec hold    Marching 15 reps;Both      Knee/Hip Exercises: Supine   Bridges Both;1 set;15 reps   with   adductor ball squeeze   Other Supine Knee/Hip Exercises clams 15x with green band      Manual Therapy   Manual therapy comments PROM for hip flexion, ER, IR, long axis distraction                    PT Short Term Goals - 09/01/20 1646      PT SHORT TERM GOAL #1   Title Patient will be I with HEP    Status Partially Met             PT Long Term Goals - 09/01/20 1646      PT LONG TERM GOAL #1   Title Patient will improve FOTO score    Baseline baseline functional outcome score 57%, 69 predicted    Time 8    Period Weeks    Status On-going      PT LONG TERM GOAL #2   Title Patient will show improved Hip ROM without increased pain 3/10    Status Partially Met      PT LONG TERM GOAL #3   Title Patient will be able to sleep through the night 50% better with decreased interruptions from pain    Time 8    Period Weeks    Status  On-going      PT LONG TERM GOAL #4   Title Patient will demonstrate the ability to tolerate standing and sitting for at least 30 minutes without pain increasing over 3/10    Baseline she has been able to stand for around 30 minutes    Time 8    Period Weeks    Status Partially Met                 Plan - 09/01/20 1644    Clinical Impression Statement Patient is making excellent strides toward her LTGs. She is more mobile and able to tolerate sitting and standing for longer periods of time. Her sleep has also improved. She was able to tolerate increased motion and strengthening activities very well today, continue to progress as able.    Personal Factors and Comorbidities Comorbidity 1    Comorbidities Lt hip arthritis    Examination-Activity Limitations Locomotion Level;Sleep;Squat;Stand;Bend    Examination-Participation Restrictions Occupation    Stability/Clinical Decision Making Evolving/Moderate complexity    Rehab Potential Fair    PT Frequency 1x / week    PT Duration 8 weeks    PT Treatment/Interventions ADLs/Self Care Home Management;Aquatic Therapy;Biofeedback;Cryotherapy;Electrical Stimulation;Ultrasound;Traction;Moist Heat;Iontophoresis 4mg/ml Dexamethasone;Gait training;Stair training;Functional mobility training;Therapeutic activities;Therapeutic exercise;Balance training;Neuromuscular re-education;Manual techniques;Passive range of motion;Dry needling;Taping;Joint Manipulations    PT Next Visit Plan Hip AROM focus with gentle strengthening as able    PT Home Exercise Plan Access Code: [email or web address]    Consulted and Agree with Plan of Care Patient           Patient will benefit from skilled therapeutic intervention in order to improve the following deficits and impairments:  Decreased range of motion,Difficulty walking,Abnormal gait,Decreased endurance,Decreased activity tolerance,Pain,Decreased balance,Decreased mobility,Decreased strength,Impaired flexibility  Visit  Diagnosis: Stiffness of right hip, not elsewhere classified  Difficulty in walking, not elsewhere classified  Pain in right hip  Muscle weakness (generalized)     Problem List Patient Active Problem List   Diagnosis Date Noted  . OSA (obstructive sleep apnea) 02/09/2020  . DJD (degenerative joint disease) 02/09/2020  . Prediabetes 08/13/2019  . Primary insomnia 08/13/2019  . Status post total replacement of right hip 09/12/2018  . Obesity (BMI   30-39.9) 08/22/2018  . Trigger thumb, left thumb 05/07/2018  . OA (osteoarthritis) of hip 04/22/2018  . Open angle primary glaucoma 01/08/2018  . Thalassemia trait, alpha 06/29/2016  . Iron deficiency anemia 06/29/2016  . S/P bunionectomy 06/29/2016  . HTN (hypertension), benign 05/18/2016  . Hypothyroidism 05/18/2016    Glenetta Hew, SPT 09/01/2020, 4:50 PM   During this treatment session, this physical therapist was present, participating in and directing the treatment.   This note has been reviewed and this clinician agrees with the information provided.  Elsie Ra, PT, DPT 09/02/20 8:07 AM   [facility] [address] [place], [place], [address] Phone: [phone]   Fax:  [phone]  Name: Shelly Cohen MRN: [id number] Date of Birth: [birth year]

## 2020-09-04 ENCOUNTER — Encounter: Payer: Self-pay | Admitting: Nurse Practitioner

## 2020-09-05 ENCOUNTER — Other Ambulatory Visit: Payer: Self-pay

## 2020-09-05 ENCOUNTER — Ambulatory Visit (INDEPENDENT_AMBULATORY_CARE_PROVIDER_SITE_OTHER): Payer: No Typology Code available for payment source | Admitting: Physical Therapy

## 2020-09-05 DIAGNOSIS — M25651 Stiffness of right hip, not elsewhere classified: Secondary | ICD-10-CM | POA: Diagnosis not present

## 2020-09-05 DIAGNOSIS — M25551 Pain in right hip: Secondary | ICD-10-CM

## 2020-09-05 DIAGNOSIS — R262 Difficulty in walking, not elsewhere classified: Secondary | ICD-10-CM

## 2020-09-05 DIAGNOSIS — M6281 Muscle weakness (generalized): Secondary | ICD-10-CM

## 2020-09-05 NOTE — Therapy (Addendum)
[facility] Physical Therapy [address] [place], [place], [address] Phone: [phone]   Fax:  [phone]  Physical Therapy Treatment  Patient Details  Name: Shelly Cohen MRN: [id number] Date of Birth: 1960/08/06 Referring Provider (PT): Doneen Poisson, MD  PHYSICAL THERAPY DISCHARGE SUMMARY  Visits from Start of Care: 5  Current functional level related to goals / functional outcomes: See note   Remaining deficits: See note   Education / Equipment: HEP  Plan:                                                    Patient goals were met. Patient is being discharged due to meeting the stated rehab goals.  ?????     Encounter Date: 09/05/2020   PT End of Session - 09/05/20 1518    Visit Number 5    Number of Visits 8    Date for PT Re-Evaluation 10/06/20    PT Start Time 1430    PT Stop Time 1514    PT Time Calculation (min) 44 min    Activity Tolerance Patient tolerated treatment well;No increased pain    Behavior During Therapy WFL for tasks assessed/performed           Past Medical History:  Diagnosis Date  . Alpha thalassemia trait   . Fibroadenoma   . Glaucoma    monitored every 54months, right eye worse than left   . History of positive PPD, treatment status unknown   . Hypertension   . OSA (obstructive sleep apnea)    mild with AHI 8/hr  . Thyroid disease     Past Surgical History:  Procedure Laterality Date  . ABDOMINAL HYSTERECTOMY  1989  . BREAST BIOPSY  2013  . BUNIONECTOMY  10/2014  . CHOLECYSTECTOMY  2017  . COLONOSCOPY    . GALLBLADDER SURGERY    . hallux limi    . LAPAROSCOPY    . TOTAL HIP ARTHROPLASTY Right 09/12/2018   Procedure: RIGHT TOTAL HIP ARTHROPLASTY ANTERIOR APPROACH;  Surgeon: Kathryne Hitch, MD;  Location: [facility];  Service: Orthopedics;  Laterality: Right;  . TUBAL LIGATION      There were no vitals filed for this visit.   Subjective Assessment - 09/05/20 1454    Subjective  Patient reports no pain today and her Rt hip continues to improve.    Pertinent History HTN    Limitations Sitting;Standing;Walking;House hold activities    How long can you sit comfortably? 10-15 min    How long can you stand comfortably? 30 mins or more    How long can you walk comfortably? 10 mins    Diagnostic tests Xray: 01/05/2020  A low AP pelvis and lateral of the right hip shows a well-seated total hip arthroplasty with no complicating features.  The left hip only shows some mild joint space narrowing but otherwise no acute findings    Pain Onset More than a month ago              [facility] PT Assessment - 09/05/20 0001      Assessment   Medical Diagnosis Right trochanteric bursitis    Referring Provider (PT) Doneen Poisson, MD      Observation/Other Assessments   Focus on Therapeutic Outcomes (FOTO)  68.6% functional score      Strength   Overall Strength Comments  tested in sittng    Strength Assessment Site Hip;Knee    Right/Left Hip Right    Right Hip Flexion 4+/5    Right Hip External Rotation  5/5    Right Hip Internal Rotation 5/5    Right Hip ABduction 5/5    Right Hip ADduction 5/5    Right/Left Knee Right    Right Knee Flexion 5/5    Right Knee Extension 5/5                         [facility] Adult PT Treatment/Exercise - 09/05/20 0001      Knee/Hip Exercises: Stretches   Piriformis Stretch 3 reps;30 seconds;Right    Piriformis Stretch Limitations knee to opp shoulder    Other Knee/Hip Stretches trunk rotation 5 sec hold 10x each side      Knee/Hip Exercises: Aerobic   Recumbent Bike 8 min      Knee/Hip Exercises: Machines for Strengthening   Cybex Leg Press 3x10 #100, SL #56 2x10      Knee/Hip Exercises: Standing   Hip Abduction Right;2 sets;10 reps    Abduction Limitations green    Hip Extension Right;2 sets;10 reps    Extension Limitations green      Knee/Hip Exercises: Supine   Bridges 2 sets;10 reps    Straight Leg Raises  Right;2 sets;10 reps    Other Supine Knee/Hip Exercises clams 15x2 with blue band                    PT Short Term Goals - 09/05/20 1522      PT SHORT TERM GOAL #1   Title Patient will be I with HEP    Status Achieved             PT Long Term Goals - 09/05/20 1522      PT LONG TERM GOAL #1   Title Patient will improve FOTO score    Baseline baseline functional outcome score 57%, 69 predicted    Time 8    Period Weeks    Status Achieved      PT LONG TERM GOAL #2   Title Patient will show improved Hip ROM without increased pain 3/10    Status Achieved      PT LONG TERM GOAL #3   Title Patient will be able to sleep through the night 50% better with decreased interruptions from pain    Time 8    Period Weeks    Status Achieved      PT LONG TERM GOAL #4   Title Patient will demonstrate the ability to tolerate standing and sitting for at least 30 minutes without pain increasing over 3/10    Baseline she has been able to stand for around 30 minutes    Time 8    Period Weeks    Status Achieved                 Plan - 09/05/20 1519    Clinical Impression Statement She has made excellent progress and has met long term goals. She feels ready to discharge from PT and will follow up with MD this week to get their opinon. We will place PT on hold to allow her to tranisiton to independent rehab at home and if we do not hear from her in 30 days we will close out her case then.    Personal Factors and Comorbidities Comorbidity 1    Comorbidities Lt  hip arthritis    Examination-Activity Limitations Locomotion Level;Sleep;Squat;Stand;Bend    Examination-Participation Restrictions Occupation    Stability/Clinical Decision Making Evolving/Moderate complexity    Rehab Potential Fair    PT Frequency 1x / week    PT Duration 8 weeks    PT Treatment/Interventions ADLs/Self Care Home Management;Aquatic Therapy;Biofeedback;Cryotherapy;Electrical  Stimulation;Ultrasound;Traction;Moist Heat;Iontophoresis 4mg /ml Dexamethasone;Gait training;Stair training;Functional mobility training;Therapeutic activities;Therapeutic exercise;Balance training;Neuromuscular re-education;Manual techniques;Passive range of motion;Dry needling;Taping;Joint Manipulations    PT Next Visit Plan plan to discharge    PT Home Exercise Plan Access Code: [email or web address]    Consulted and Agree with Plan of Care Patient           Patient will benefit from skilled therapeutic intervention in order to improve the following deficits and impairments:  Decreased range of motion,Difficulty walking,Abnormal gait,Decreased endurance,Decreased activity tolerance,Pain,Decreased balance,Decreased mobility,Decreased strength,Impaired flexibility  Visit Diagnosis: Stiffness of right hip, not elsewhere classified  Difficulty in walking, not elsewhere classified  Pain in right hip  Muscle weakness (generalized)     Problem List Patient Active Problem List   Diagnosis Date Noted  . OSA (obstructive sleep apnea) 02/09/2020  . DJD (degenerative joint disease) 02/09/2020  . Prediabetes 08/13/2019  . Primary insomnia 08/13/2019  . Status post total replacement of right hip 09/12/2018  . Obesity (BMI 30-39.9) 08/22/2018  . Trigger thumb, left thumb 05/07/2018  . OA (osteoarthritis) of hip 04/22/2018  . Open angle primary glaucoma 01/08/2018  . Thalassemia trait, alpha 06/29/2016  . Iron deficiency anemia 06/29/2016  . S/P bunionectomy 06/29/2016  . HTN (hypertension), benign 05/18/2016  . Hypothyroidism 05/18/2016    Debbe Odea, PT,DPT 09/05/2020, 3:24 PM  Farley Ly PT, MPT  [facility] Physical Therapy [address] [place], [place], [address] Phone: [phone]   Fax:  [phone]  Name: Shelly Cohen MRN: [id number] Date of Birth: 05-Sep-1960

## 2020-09-07 ENCOUNTER — Encounter: Payer: Self-pay | Admitting: Orthopaedic Surgery

## 2020-09-07 ENCOUNTER — Ambulatory Visit (INDEPENDENT_AMBULATORY_CARE_PROVIDER_SITE_OTHER): Payer: No Typology Code available for payment source | Admitting: Orthopaedic Surgery

## 2020-09-07 DIAGNOSIS — Z96641 Presence of right artificial hip joint: Secondary | ICD-10-CM | POA: Diagnosis not present

## 2020-09-07 DIAGNOSIS — M7061 Trochanteric bursitis, right hip: Secondary | ICD-10-CM

## 2020-09-07 NOTE — Progress Notes (Signed)
The patient is coming in for follow-up after having physical therapy for her right hip trochanteric bursitis and having had a steroid injection as well.  Actually replaced her right hip in 2020.  She said therapy is working great for her.  She said the injection helped.  She is doing well overall and has no complaints.  Her right operative hip moves well.  There is no significant pain over the trochanteric area at all.  We had a long and thorough discussion about her hips.  Follow-up at this point can be as needed.  If there is anything else musculoskeletal that she needs to be seen more she knows to let us know.  If she does develop any other issues with the right hip she knows to also let us know.

## 2020-09-16 ENCOUNTER — Other Ambulatory Visit: Payer: Self-pay | Admitting: Cardiology

## 2020-09-19 ENCOUNTER — Encounter: Payer: Self-pay | Admitting: Nurse Practitioner

## 2020-09-20 ENCOUNTER — Encounter: Payer: Self-pay | Admitting: Nurse Practitioner

## 2020-09-20 ENCOUNTER — Other Ambulatory Visit: Payer: Self-pay

## 2020-09-20 ENCOUNTER — Ambulatory Visit (INDEPENDENT_AMBULATORY_CARE_PROVIDER_SITE_OTHER): Payer: No Typology Code available for payment source | Admitting: Nurse Practitioner

## 2020-09-20 VITALS — BP 138/76 | HR 78 | Temp 96.4°F | Ht 61.5 in | Wt 217.8 lb

## 2020-09-20 DIAGNOSIS — L03031 Cellulitis of right toe: Secondary | ICD-10-CM | POA: Diagnosis not present

## 2020-09-20 NOTE — Patient Instructions (Signed)
Call office if no improvement in 1week Perform warm water and epsom salt soak daily x 43mins at a time.  Paronychia Paronychia is an infection of the skin. It happens near a fingernail or toenail. It may cause pain and swelling around the nail. In some cases, a fluid-filled bump (abscess) can form near or under the nail. Usually, this condition is not serious, and it clears up with treatment. Follow these instructions at home: Wound care  Keep the affected area clean.  Soak the fingers or toes in warm water as told by your doctor. You may be told to do this for 20 minutes, 2-3 times a day.  Keep the area dry when you are not soaking it.  Do not try to drain a fluid-filled bump on your own.  Follow instructions from your doctor about how to take care of the affected area. Make sure you: ? Wash your hands with soap and water before you change your bandage (dressing). If you cannot use soap and water, use hand sanitizer. ? Change your bandage as told by your doctor.  If you had a fluid-filled bump and your doctor drained it, check the area every day for signs of infection. Check for: ? Redness, swelling, or pain. ? Fluid or blood. ? Warmth. ? Pus or a bad smell. Medicines  Take over-the-counter and prescription medicines only as told by your doctor.  If you were prescribed an antibiotic medicine, take it as told by your doctor. Do not stop taking it even if you start to feel better.   General instructions  Avoid touching any chemicals.  Do not pick at the affected area. Prevention  To prevent this condition from happening again: ? Wear rubber gloves when putting your hands in water for washing dishes or other tasks. ? Wear gloves if your hands might touch cleaners or chemicals. ? Avoid injuring your nails or fingertips. ? Do not bite your nails or tear hangnails. ? Do not cut your nails very short. ? Do not cut the skin at the base and sides of the nail (cuticles). ? Use  clean nail clippers or scissors when trimming nails. Contact a doctor if:  You feel worse.  You do not get better.  You have more fluid, blood, or pus coming from the affected area.  Your finger or knuckle is swollen or is hard to move. Get help right away if you have:  A fever or chills.  Redness spreading from the affected area.  Pain in a joint or muscle. Summary  Paronychia is an infection of the skin. It happens near a fingernail or toenail.  This condition may cause pain and swelling around the nail.  Soak the fingers or toes in warm water as told by your doctor.  Usually, this condition is not serious, and it clears up with treatment. This information is not intended to replace advice given to you by your health care provider. Make sure you discuss any questions you have with your health care provider. Document Revised: 04/13/2020 Document Reviewed: 04/13/2020 Elsevier Patient Education  2021 [employer].

## 2020-09-20 NOTE — Progress Notes (Signed)
Subjective:  Patient ID: Shelly Cohen, female    DOB: 1961-04-21  Age: 60 y.o. MRN: [id number]  CC: Acute Visit (Pt c/o right big toe pain, pt states she cut the nail last week and noticed it was hurting, pt states it is now swollen and bruised. )  Toe Pain  The incident occurred 5 to 7 days ago. The incident occurred at home. Injury mechanism: trimmed nail. Pain location: right great toe. The quality of the pain is described as aching. The pain is mild. The pain has been improving since onset. Pertinent negatives include no inability to bear weight, loss of motion, loss of sensation, muscle weakness, numbness or tingling. She reports no foreign bodies present. The symptoms are aggravated by palpation. Treatments tried: neosporin. The treatment provided mild relief.   Reviewed past Medical, Social and Family history today.  Outpatient Medications Prior to Visit  Medication Sig Dispense Refill  . amLODipine (NORVASC) 10 MG tablet Take 1 tablet (10 mg total) by mouth daily. 90 tablet 3  . Ascorbic Acid (VITAMIN C PO) Take 1 tablet by mouth daily.     . carvedilol (COREG) 25 MG tablet TAKE 1 TABLET BY MOUTH 2 TIMES DAILY. 180 tablet 2  . diclofenac (VOLTAREN) 75 MG EC tablet Take 1 tablet (75 mg total) by mouth 2 (two) times daily between meals as needed. 60 tablet 1  . latanoprost (XALATAN) 0.005 % ophthalmic solution SMARTSIG:1 Drop(s) In Eye(s) Every Evening    . levothyroxine (SYNTHROID) 50 MCG tablet Take 1 tablet (50 mcg total) by mouth daily before breakfast. 90 tablet 1  . lisinopril (ZESTRIL) 40 MG tablet Take 1 tablet (40 mg total) by mouth daily. 90 tablet 3  . Multiple Vitamin (MULTI-VITAMINS) TABS Take 1 tablet by mouth daily.     . Omega-3 Fatty Acids (FISH OIL PO) Take 1 capsule by mouth daily.     Marland Kitchen spironolactone (ALDACTONE) 50 MG tablet TAKE 1 TABLET BY MOUTH DAILY 90 tablet 3  . docusate sodium (COLACE) 100 MG capsule Take 1 capsule (100 mg total) by mouth 2 (two) times  daily. (Patient not taking: Reported on 09/20/2020) 10 capsule 0  . eszopiclone (LUNESTA) 2 MG TABS tablet Take 1 tablet (2 mg total) by mouth at bedtime as needed for sleep. Take immediately before bedtime (Patient not taking: No sig reported) 30 tablet 1  . Latanoprostene Bunod 0.024 % SOLN Apply 1 drop to eye 2 (two) times daily. (Patient not taking: Reported on 09/20/2020)    . naproxen (NAPROSYN) 500 MG tablet Take 1 tablet (500 mg total) by mouth 2 (two) times daily with a meal. (Patient not taking: Reported on 09/20/2020) 14 tablet 0   No facility-administered medications prior to visit.    ROS See HPI  Objective:  BP 138/76 (BP Location: Left Arm, Patient Position: Sitting, Cuff Size: Large)   Pulse 78   Temp (!) 96.4 F (35.8 C) (Temporal)   Ht 5' 1.5" (1.562 m)   Wt 217 lb 12.8 oz (98.8 kg)   SpO2 98%   BMI 40.49 kg/m   Physical Exam Cardiovascular:     Pulses:          Dorsalis pedis pulses are 2+ on the right side.       Posterior tibial pulses are 2+ on the right side.  Musculoskeletal:       Feet:  Feet:     Right foot:     Toenail Condition: Right toenails are abnormally thick.  Assessment & Plan:  This visit occurred during the SARS-CoV-2 public health emergency.  Safety protocols were in place, including screening questions prior to the visit, additional usage of staff PPE, and extensive cleaning of exam room while observing appropriate contact time as indicated for disinfecting solutions.   Nely was seen today for acute visit.  Diagnoses and all orders for this visit:  Paronychia of great toe of right foot   Problem List Items Addressed This Visit   None   Visit Diagnoses    Paronychia of great toe of right foot    -  Primary      Follow-up: No follow-ups on file.  Wilfred Lacy, NP

## 2020-09-21 ENCOUNTER — Encounter: Payer: Self-pay | Admitting: Skilled Nursing Facility1

## 2020-09-21 ENCOUNTER — Encounter: Payer: No Typology Code available for payment source | Attending: General Surgery | Admitting: Skilled Nursing Facility1

## 2020-09-21 DIAGNOSIS — E669 Obesity, unspecified: Secondary | ICD-10-CM | POA: Insufficient documentation

## 2020-09-21 NOTE — Progress Notes (Signed)
Nutrition Assessment for Bariatric Surgery Medical Nutrition Therapy  Patient was seen on 09/21/2020 for Pre-Operative Nutrition Assessment. Letter of approval faxed to [facility] Surgery bariatric surgery program coordinator on 09/21/2020  Referral stated Supervised Weight Loss (SWL) visits needed: 0; doing 6 with Active health  Pt completed visits.  Pt has cleared nutrition requirements.   Planned surgery: sleeve gastrectomy Pt expectation of surgery: to get off some medications  Pt expectation of dietitian: to help guide     NUTRITION ASSESSMENT   Anthropometrics  Start weight at [facility]: 219.2 lbs (date: 09/21/2020)  Height: 61.5 in BMI: 40.75 kg/m2     Clinical  Medical hx: HTN, DM Medications: see list Labs: A1C 6.5, hemoglobin 11.7 Notable signs/symptoms: stiff hip Any previous deficiencies? No  Micronutrient Nutrition Focused Physical Exam: Hair: No issues observed Eyes: No issues observed Mouth: No issues observed Neck: No issues observed Nails: No issues observed Skin: No issues observed  Lifestyle & Dietary Hx  Pt states she is lactose intolerant. Pt states she has noticed fatty foods irritate her stomach. Pt states she has an errgonmoic chair and a stand up desk which she utilizes to prevent her hip from stiffening.  Pt states she is a veggie eater. Pt states she does not eat past 7pm due to it upsetting her stomach. Pt states she is learning the guitar and painting. Pt states she is excited to start swimming.   24-Hr Dietary Recall First Meal: oatmeal  Snack: cheese Second Meal: leftovers: chicken or hamburger or hot dog Snack: chips Third Meal: chicken or hamburger or hot dog or plant based proteins + aspargus + rice  Snack: popcorn or cookie or banana Beverages: water, sweet tea half, soda, coffee   Estimated Energy Needs Calories: 1500   NUTRITION DIAGNOSIS  Overweight/obesity ([place]-3.3) related to past poor dietary habits and physical  inactivity as evidenced by patient w/ planned sleeve gastrectomy surgery following dietary guidelines for continued weight loss.    NUTRITION INTERVENTION  Nutrition counseling (C-1) and education (E-2) to facilitate bariatric surgery goals.   Pre-Op Goals Reviewed with the Patient . Track food and beverage intake (pen and paper, MyFitness Pal, Baritastic app, etc.) . Make healthy food choices while monitoring portion sizes . Consume 3 meals per day or try to eat every 3-5 hours . Avoid concentrated sugars and fried foods . Keep sugar & fat in the single digits per serving on food labels . Practice CHEWING your food (aim for applesauce consistency) . Practice not drinking 15 minutes before, during, and 30 minutes after each meal and snack . Avoid all carbonated beverages (ex: soda, sparkling beverages)  . Limit caffeinated beverages (ex: coffee, tea, energy drinks) . Avoid all sugar-sweetened beverages (ex: regular soda, sports drinks)  . Avoid alcohol  . Aim for 64-100 ounces of FLUID daily (with at least half of fluid intake being plain water)  . Aim for at least 60-80 grams of PROTEIN daily . Look for a liquid protein source that contains ?15 g protein and ?5 g carbohydrate (ex: shakes, drinks, shots) . Make a list of non-food related activities . Physical activity is an important part of a healthy lifestyle so keep it moving! The goal is to reach 150 minutes of exercise per week, including cardiovascular and weight baring activity.  *Goals that are bolded indicate the pt would like to start working towards these  Handouts Provided Include  . Bariatric Surgery handouts (Nutrition Visits, Pre-Op Goals, Protein Shakes, Vitamins & Minerals)  Learning  Style & Readiness for Change Teaching method utilized: Visual & Auditory  Demonstrated degree of understanding via: Teach Back  Readiness Level: Action Barriers to learning/adherence to lifestyle change: none identified   MONITORING &  EVALUATION Dietary intake, weekly physical activity, body weight, and pre-op goals reached at next nutrition visit.    Next Steps  Patient is to follow up at [facility] for Pre-Op Class >2 weeks before surgery for further nutrition education.   Pt has completed visits. No further supervised visits required with this office.

## 2020-09-23 ENCOUNTER — Other Ambulatory Visit (HOSPITAL_BASED_OUTPATIENT_CLINIC_OR_DEPARTMENT_OTHER): Payer: Self-pay

## 2020-09-28 ENCOUNTER — Encounter: Payer: Self-pay | Admitting: Nurse Practitioner

## 2020-09-28 DIAGNOSIS — L03031 Cellulitis of right toe: Secondary | ICD-10-CM

## 2020-09-30 ENCOUNTER — Other Ambulatory Visit: Payer: Self-pay | Admitting: Nurse Practitioner

## 2020-09-30 MED ORDER — MUPIROCIN 2 % EX OINT: 1.0000 "application " | TOPICAL_OINTMENT | Freq: Two times a day (BID) | CUTANEOUS | 0 refills | Status: DC

## 2020-10-01 ENCOUNTER — Other Ambulatory Visit (HOSPITAL_COMMUNITY): Payer: Self-pay

## 2020-10-01 MED FILL — Mupirocin Oint 2%: CUTANEOUS | 4 days supply | Qty: 22 | Fill #0 | Status: AC

## 2020-10-02 ENCOUNTER — Other Ambulatory Visit (HOSPITAL_COMMUNITY): Payer: Self-pay

## 2020-10-04 ENCOUNTER — Other Ambulatory Visit (HOSPITAL_COMMUNITY): Payer: Self-pay

## 2020-10-04 ENCOUNTER — Other Ambulatory Visit (HOSPITAL_BASED_OUTPATIENT_CLINIC_OR_DEPARTMENT_OTHER): Payer: Self-pay

## 2020-10-05 ENCOUNTER — Ambulatory Visit (INDEPENDENT_AMBULATORY_CARE_PROVIDER_SITE_OTHER): Payer: No Typology Code available for payment source | Admitting: Psychology

## 2020-10-05 DIAGNOSIS — F509 Eating disorder, unspecified: Secondary | ICD-10-CM | POA: Diagnosis not present

## 2020-10-09 ENCOUNTER — Other Ambulatory Visit: Payer: Self-pay | Admitting: Nurse Practitioner

## 2020-10-09 DIAGNOSIS — I10 Essential (primary) hypertension: Secondary | ICD-10-CM

## 2020-10-09 MED FILL — Spironolactone Tab 50 MG: ORAL | 90 days supply | Qty: 90 | Fill #0 | Status: AC

## 2020-10-09 MED FILL — Amlodipine Besylate Tab 10 MG (Base Equivalent): ORAL | 90 days supply | Qty: 90 | Fill #0 | Status: AC

## 2020-10-10 ENCOUNTER — Other Ambulatory Visit (HOSPITAL_COMMUNITY): Payer: Self-pay

## 2020-10-10 NOTE — Telephone Encounter (Signed)
Medication was discontinued at one point.  LM for pt to call office back to verify.

## 2020-10-11 NOTE — Telephone Encounter (Signed)
Patient returned call to the office. Please call back.

## 2020-10-17 ENCOUNTER — Other Ambulatory Visit: Payer: Self-pay

## 2020-10-17 ENCOUNTER — Other Ambulatory Visit (HOSPITAL_COMMUNITY): Payer: Self-pay

## 2020-10-17 ENCOUNTER — Encounter: Payer: Self-pay | Admitting: Nurse Practitioner

## 2020-10-17 MED ORDER — LISINOPRIL 40 MG PO TABS
ORAL_TABLET | Freq: Every day | ORAL | 3 refills | Status: DC
  Filled 2020-10-17: qty 90, 90d supply, fill #0
  Filled 2021-01-10: qty 90, 90d supply, fill #1

## 2020-10-17 NOTE — Telephone Encounter (Signed)
Pt states she is still taking medication and has been without for 1 week.

## 2020-10-18 MED FILL — Latanoprost Ophth Soln 0.005%: OPHTHALMIC | 25 days supply | Qty: 2.5 | Fill #0 | Status: AC

## 2020-10-19 ENCOUNTER — Other Ambulatory Visit (HOSPITAL_COMMUNITY): Payer: Self-pay

## 2020-10-19 ENCOUNTER — Ambulatory Visit (INDEPENDENT_AMBULATORY_CARE_PROVIDER_SITE_OTHER): Payer: No Typology Code available for payment source | Admitting: Psychology

## 2020-10-19 DIAGNOSIS — F509 Eating disorder, unspecified: Secondary | ICD-10-CM | POA: Diagnosis not present

## 2020-10-22 ENCOUNTER — Ambulatory Visit (INDEPENDENT_AMBULATORY_CARE_PROVIDER_SITE_OTHER): Payer: No Typology Code available for payment source | Admitting: Podiatry

## 2020-10-22 ENCOUNTER — Encounter: Payer: Self-pay | Admitting: Podiatry

## 2020-10-22 ENCOUNTER — Other Ambulatory Visit: Payer: Self-pay

## 2020-10-22 DIAGNOSIS — L600 Ingrowing nail: Secondary | ICD-10-CM | POA: Diagnosis not present

## 2020-10-22 DIAGNOSIS — B351 Tinea unguium: Secondary | ICD-10-CM

## 2020-10-22 NOTE — Progress Notes (Signed)
Subjective: 60 year old female presents the office today for concerns of ingrown toenail, discoloration to her right big toenail.  She states that she gets tenderness on the medial aspect that she points to.  Denies any redness or drainage.  The area is tender with pressure in shoes.  No recent treatment.  No other concerns. Denies any systemic complaints such as fevers, chills, nausea, vomiting. No acute changes since last appointment, and no other complaints at this time.   Objective: AAO x3, NAD DP/PT pulses palpable bilaterally, CRT less than 3 seconds There is incurvation present to medial aspect of the right hallux toenail with tenderness palpation.  Minimal edema but there is no erythema or warmth but there is no drainage or pus or ascending cellulitis.  No fluctuation crepitation.  No pain on the prior surgical site. No pain with calf compression, swelling, warmth, erythema  Assessment: Ingrown toenail right medial hallux; onychomycosis  Plan: -All treatment options discussed with the patient including all alternatives, risks, complications.  -At this time, the patient is requesting partial nail removal with chemical matricectomy to the symptomatic portion of the nail. Risks and complications were discussed with the patient for which they understand and written consent was obtained. Under sterile conditions a total of 3 mL of a mixture of 2% lidocaine plain and 0.5% Marcaine plain was infiltrated in a hallux block fashion. Once anesthetized, the skin was prepped in sterile fashion. A tourniquet was then applied. Next the medial aspect of hallux nail border was then sharply excised making sure to remove the entire offending nail border. Once the nails were ensured to be removed area was debrided and the underlying skin was intact. There is no purulence identified in the procedure. Next phenol was then applied under standard conditions and copiously irrigated. Silvadene was applied. A dry sterile  dressing was applied. After application of the dressing the tourniquet was removed and there is found to be an immediate capillary refill time to the digit. The patient tolerated the procedure well any complications. Post procedure instructions were discussed the patient for which he verbally understood. Follow-up in one week for nail check or sooner if any problems are to arise. Discussed signs/symptoms of infection and directed to call the office immediately should any occur or go directly to the emergency room. In the meantime, encouraged to call the office with any questions, concerns, changes symptoms. -Nails sent for culture, pathology to [facility] labs  -Patient encouraged to call the office with any questions, concerns, change in symptoms.

## 2020-10-22 NOTE — Patient Instructions (Signed)

## 2020-10-22 NOTE — Addendum Note (Signed)
Addended by: Cranford Mon R on: 10/22/2020 08:52 AM   Modules accepted: Orders

## 2020-10-24 ENCOUNTER — Telehealth: Payer: Self-pay | Admitting: *Deleted

## 2020-10-24 ENCOUNTER — Telehealth: Payer: Self-pay | Admitting: Podiatry

## 2020-10-24 NOTE — Telephone Encounter (Signed)
Patient called and lvm on Saturday and stated that her toe is in severe pain at night. She states she is taking OTC ibuprofen and elevating her foot. She is still having sharp pains. Please call patient.

## 2020-10-24 NOTE — Telephone Encounter (Signed)
Called and left a message for patient to call me back due to the patient had left a message on Saturday afternoon. Shelly Cohen

## 2020-10-24 NOTE — Telephone Encounter (Signed)
Lattie Haw called and left VM to call back.

## 2020-10-25 ENCOUNTER — Other Ambulatory Visit: Payer: Self-pay | Admitting: Nurse Practitioner

## 2020-10-25 DIAGNOSIS — Z1231 Encounter for screening mammogram for malignant neoplasm of breast: Secondary | ICD-10-CM

## 2020-10-31 ENCOUNTER — Other Ambulatory Visit: Payer: Self-pay

## 2020-10-31 ENCOUNTER — Ambulatory Visit (INDEPENDENT_AMBULATORY_CARE_PROVIDER_SITE_OTHER): Payer: No Typology Code available for payment source | Admitting: Podiatry

## 2020-10-31 ENCOUNTER — Encounter: Payer: Self-pay | Admitting: Podiatry

## 2020-10-31 DIAGNOSIS — L600 Ingrowing nail: Secondary | ICD-10-CM

## 2020-10-31 DIAGNOSIS — Z9889 Other specified postprocedural states: Secondary | ICD-10-CM

## 2020-10-31 NOTE — Patient Instructions (Signed)

## 2020-11-02 NOTE — Progress Notes (Signed)
Subjective: Shelly Cohen is a 60 y.o.  female returns to office today for follow up evaluation after having right Hallux medial partial nail avulsion performed. Patient has been soaking using epsom salts and applying topical antibiotic covered with bandaid daily.  Did have some clear drainage but seems to have stopped.  No pus.  She still soaking Epson salts.  Pain is improving as well.  Patient denies fevers, chills, nausea, vomiting. Denies any calf pain, chest pain, SOB.   Objective:  Vitals: Reviewed  General: Well developed, nourished, in no acute distress, alert and oriented x3   Dermatology: Skin is warm, dry and supple bilateral. Right hallux nail border appears to be clean, dry, with mild granular tissue and surrounding scab. There is no surrounding erythema, edema, drainage/purulence. There are no other lesions or other signs of infection present.  Neurovascular status: Intact. No lower extremity swelling; No pain with calf compression bilateral.  Musculoskeletal: Decreased tenderness to palpation of the right medial hallux nail fold(s). Muscular strength within normal limits bilateral.   Assesement and Plan: S/p partial nail avulsion, doing well.   -Continue soaking in epsom salts twice a day followed by antibiotic ointment and a band-aid. Can leave uncovered at night. Continue this until completely healed.  -If the area has not healed in 2 weeks, call the office for follow-up appointment, or sooner if any problems arise.  -Monitor for any signs/symptoms of infection. Call the office immediately if any occur or go directly to the emergency room. Call with any questions/concerns.  Celesta Gentile, DPM

## 2020-11-15 ENCOUNTER — Other Ambulatory Visit: Payer: Self-pay | Admitting: Nurse Practitioner

## 2020-11-15 DIAGNOSIS — E039 Hypothyroidism, unspecified: Secondary | ICD-10-CM

## 2020-11-16 ENCOUNTER — Other Ambulatory Visit (HOSPITAL_COMMUNITY): Payer: Self-pay

## 2020-11-16 MED ORDER — LEVOTHYROXINE SODIUM 50 MCG PO TABS
ORAL_TABLET | Freq: Every day | ORAL | 1 refills | Status: DC
  Filled 2020-11-16: qty 90, 90d supply, fill #0

## 2020-11-16 NOTE — Telephone Encounter (Signed)
Last OV 09/20/2020 Last fill 02/09/20  #90/1

## 2020-11-25 ENCOUNTER — Ambulatory Visit
Admission: RE | Admit: 2020-11-25 | Discharge: 2020-11-25 | Disposition: A | Discharge status: Home / Self Care | Payer: No Typology Code available for payment source | Source: Ambulatory Visit | Attending: Nurse Practitioner | Admitting: Nurse Practitioner

## 2020-11-25 DIAGNOSIS — Z1231 Encounter for screening mammogram for malignant neoplasm of breast: Secondary | ICD-10-CM

## 2020-12-25 ENCOUNTER — Other Ambulatory Visit: Payer: Self-pay

## 2020-12-26 ENCOUNTER — Other Ambulatory Visit (HOSPITAL_COMMUNITY): Payer: Self-pay

## 2020-12-26 MED ORDER — LATANOPROST 0.005 % OP SOLN
OPHTHALMIC | 6 refills | Status: DC
  Filled 2020-12-26: qty 5, 50d supply, fill #0

## 2020-12-27 ENCOUNTER — Other Ambulatory Visit (HOSPITAL_COMMUNITY): Payer: Self-pay

## 2021-01-09 ENCOUNTER — Other Ambulatory Visit (HOSPITAL_COMMUNITY): Payer: Self-pay

## 2021-01-10 ENCOUNTER — Other Ambulatory Visit: Payer: Self-pay | Admitting: Cardiology

## 2021-01-10 ENCOUNTER — Other Ambulatory Visit (HOSPITAL_COMMUNITY): Payer: Self-pay

## 2021-01-10 MED ORDER — CARVEDILOL 25 MG PO TABS
ORAL_TABLET | Freq: Two times a day (BID) | ORAL | 2 refills | Status: DC
  Filled 2021-01-10: qty 180, 90d supply, fill #0

## 2021-01-10 MED FILL — Spironolactone Tab 50 MG: ORAL | 90 days supply | Qty: 90 | Fill #1 | Status: AC

## 2021-01-10 MED FILL — Amlodipine Besylate Tab 10 MG (Base Equivalent): ORAL | 90 days supply | Qty: 90 | Fill #1 | Status: AC

## 2021-01-30 ENCOUNTER — Ambulatory Visit (INDEPENDENT_AMBULATORY_CARE_PROVIDER_SITE_OTHER): Payer: No Typology Code available for payment source | Admitting: Psychology

## 2021-01-30 DIAGNOSIS — F509 Eating disorder, unspecified: Secondary | ICD-10-CM

## 2021-01-30 NOTE — Progress Notes (Signed)
Sent message, via epic in basket, requesting orders in epic from surgeon.  

## 2021-02-06 ENCOUNTER — Encounter: Payer: No Typology Code available for payment source | Attending: General Surgery | Admitting: Skilled Nursing Facility1

## 2021-02-06 ENCOUNTER — Other Ambulatory Visit: Payer: Self-pay

## 2021-02-06 DIAGNOSIS — E669 Obesity, unspecified: Secondary | ICD-10-CM | POA: Insufficient documentation

## 2021-02-06 NOTE — Progress Notes (Signed)
Pre-Operative Nutrition Class:    Patient was seen on 02/06/2021 for Pre-Operative Bariatric Surgery Education at [facility].    Surgery date: 02/20/2021 Surgery type: sleeve Start weight at [facility]: 219.2 Weight today: 208.5  Samples given per MNT protocol. Patient educated on appropriate usage: Protien 20 shake Lot: [id number] Exp: 11/11/21   Bariatric Advantage Calcium Lot # [id number] Exp: 02/23     procare Vitamins Calcium Lot # [id number] Exp: 01/23    The following the learning objectives were met by the patient during this course: Identify Pre-Op Dietary Goals and will begin 2 weeks pre-operatively Identify appropriate sources of fluids and proteins  State protein recommendations and appropriate sources pre and post-operatively Identify Post-Operative Dietary Goals and will follow for 2 weeks post-operatively Identify appropriate multivitamin and calcium sources Describe the need for physical activity post-operatively and will follow MD recommendations State when to call healthcare provider regarding medication questions or post-operative complications When having a diagnosis of diabetes understanding hypoglycemia symptoms and the inclusion of 1 complex carbohydrate per meal  Handouts given during class include: Pre-Op Bariatric Surgery Diet Handout Protein Shake Handout Post-Op Bariatric Surgery Nutrition Handout BELT Program Information Flyer Support Group Information Flyer [facility] Bariatric Supplements Price List  Follow-Up Plan: Patient will follow-up at [facility] 2 weeks post operatively for diet advancement per MD.

## 2021-02-09 ENCOUNTER — Encounter: Payer: Self-pay | Admitting: Nurse Practitioner

## 2021-02-09 ENCOUNTER — Other Ambulatory Visit: Payer: Self-pay

## 2021-02-09 ENCOUNTER — Ambulatory Visit (INDEPENDENT_AMBULATORY_CARE_PROVIDER_SITE_OTHER): Payer: No Typology Code available for payment source | Admitting: Nurse Practitioner

## 2021-02-09 VITALS — BP 122/64 | HR 80 | Temp 97.0°F | Ht 62.0 in | Wt 205.4 lb

## 2021-02-09 DIAGNOSIS — I10 Essential (primary) hypertension: Secondary | ICD-10-CM

## 2021-02-09 DIAGNOSIS — D500 Iron deficiency anemia secondary to blood loss (chronic): Secondary | ICD-10-CM | POA: Diagnosis not present

## 2021-02-09 DIAGNOSIS — Z0000 Encounter for general adult medical examination without abnormal findings: Secondary | ICD-10-CM | POA: Diagnosis not present

## 2021-02-09 DIAGNOSIS — Z13220 Encounter for screening for lipoid disorders: Secondary | ICD-10-CM

## 2021-02-09 DIAGNOSIS — R7303 Prediabetes: Secondary | ICD-10-CM

## 2021-02-09 DIAGNOSIS — E039 Hypothyroidism, unspecified: Secondary | ICD-10-CM

## 2021-02-09 DIAGNOSIS — Z0001 Encounter for general adult medical examination with abnormal findings: Secondary | ICD-10-CM

## 2021-02-09 DIAGNOSIS — Z136 Encounter for screening for cardiovascular disorders: Secondary | ICD-10-CM

## 2021-02-09 LAB — COMPREHENSIVE METABOLIC PANEL
ALT: 11 U/L (ref 0–35)
AST: 13 U/L (ref 0–37)
Albumin: 4.3 g/dL (ref 3.5–5.2)
Alkaline Phosphatase: 73 U/L (ref 39–117)
BUN: 23 mg/dL (ref 6–23)
CO2: 25 mEq/L (ref 19–32)
Calcium: 10.3 mg/dL (ref 8.4–10.5)
Chloride: 103 mEq/L (ref 96–112)
Creatinine, Ser: 0.92 mg/dL (ref 0.40–1.20)
GFR: 68.09 mL/min (ref >60.00)
Glucose, Bld: 91 mg/dL (ref 70–99)
Potassium: 3.9 mEq/L (ref 3.5–5.1)
Sodium: 139 mEq/L (ref 135–145)
Total Bilirubin: 0.4 mg/dL (ref 0.2–1.2)
Total Protein: 7.9 g/dL (ref 6.0–8.3)

## 2021-02-09 LAB — LIPID PANEL
Cholesterol: 174 mg/dL (ref 0–200)
HDL: 58.8 mg/dL (ref >39.00)
LDL Cholesterol: 101 mg/dL — ABNORMAL HIGH (ref 0–99)
NonHDL: 115.27
Total CHOL/HDL Ratio: 3
Triglycerides: 71 mg/dL (ref 0.0–149.0)
VLDL: 14.2 mg/dL (ref 0.0–40.0)

## 2021-02-09 LAB — HEMOGLOBIN A1C: Hgb A1c MFr Bld: 6.2 % (ref 4.6–6.5)

## 2021-02-09 LAB — TSH: TSH: 2.19 u[IU]/mL (ref 0.35–5.50)

## 2021-02-09 MED ORDER — LEVOTHYROXINE SODIUM 50 MCG PO TABS
ORAL_TABLET | Freq: Every day | ORAL | 1 refills | Status: DC
  Filled 2021-02-09: qty 90, 90d supply, fill #0
  Filled 2021-05-21: qty 90, 90d supply, fill #1

## 2021-02-09 MED ORDER — AMLODIPINE BESYLATE 10 MG PO TABS
ORAL_TABLET | Freq: Every day | ORAL | 3 refills | Status: DC
  Filled 2021-02-09: qty 90, fill #0

## 2021-02-09 NOTE — Assessment & Plan Note (Signed)
BP at goal with coreg, amlodipine, lisinopril and spironolactone BP Readings from Last 3 Encounters:  02/09/21 122/64  09/20/20 138/76  08/31/20 122/80   Maintain current medications Refills sent

## 2021-02-09 NOTE — Patient Instructions (Signed)
Wish you rapid recovery with upcoming surgery  Go to lab for blood draw and urine collection.  Preventive Care 5-60 Years Old, Female Preventive care refers to lifestyle choices and visits with your health care provider that can promote health and wellness. This includes: A yearly physical exam. This is also called an annual wellness visit. Regular dental and eye exams. Immunizations. Screening for certain conditions. Healthy lifestyle choices, such as: Eating a healthy diet. Getting regular exercise. Not using drugs or products that contain nicotine and tobacco. Limiting alcohol use. What can I expect for my preventive care visit? Physical exam Your health care provider will check your: Height and weight. These may be used to calculate your BMI (body mass index). BMI is a measurement that tells if you are at a healthy weight. Heart rate and blood pressure. Body temperature. Skin for abnormal spots. Counseling Your health care provider may ask you questions about your: Past medical problems. Family's medical history. Alcohol, tobacco, and drug use. Emotional well-being. Home life and relationship well-being. Sexual activity. Diet, exercise, and sleep habits. Work and work Statistician. Access to firearms. Method of birth control. Menstrual cycle. Pregnancy history. What immunizations do I need?  Vaccines are usually given at various ages, according to a schedule. Your health care provider will recommend vaccines for you based on your age, medicalhistory, and lifestyle or other factors, such as travel or where you work. What tests do I need? Blood tests Lipid and cholesterol levels. These may be checked every 5 years, or more often if you are over 50 years old. Hepatitis C test. Hepatitis B test. Screening Lung cancer screening. You may have this screening every year starting at age 73 if you have a 30-pack-year history of smoking and currently smoke or have quit within  the past 15 years. Colorectal cancer screening. All adults should have this screening starting at age 26 and continuing until age 27. Your health care provider may recommend screening at age 36 if you are at increased risk. You will have tests every 1-10 years, depending on your results and the type of screening test. Diabetes screening. This is done by checking your blood sugar (glucose) after you have not eaten for a while (fasting). You may have this done every 1-3 years. Mammogram. This may be done every 1-2 years. Talk with your health care provider about when you should start having regular mammograms. This may depend on whether you have a family history of breast cancer. BRCA-related cancer screening. This may be done if you have a family history of breast, ovarian, tubal, or peritoneal cancers. Pelvic exam and Pap test. This may be done every 3 years starting at age 59. Starting at age 76, this may be done every 5 years if you have a Pap test in combination with an HPV test. Other tests STD (sexually transmitted disease) testing, if you are at risk. Bone density scan. This is done to screen for osteoporosis. You may have this scan if you are at high risk for osteoporosis. Talk with your health care provider about your test results, treatment options,and if necessary, the need for more tests. Follow these instructions at home: Eating and drinking  Eat a diet that includes fresh fruits and vegetables, whole grains, lean protein, and low-fat dairy products. Take vitamin and mineral supplements as recommended by your health care provider. Do not drink alcohol if: Your health care provider tells you not to drink. You are pregnant, may be pregnant, or are planning to  become pregnant. If you drink alcohol: Limit how much you have to 0-1 drink a day. Be aware of how much alcohol is in your drink. In the [place] one drink equals one 12 oz bottle of beer (355 mL), one 5 oz glass of wine (148  mL), or one 1 oz glass of hard liquor (44 mL).  Lifestyle Take daily care of your teeth and gums. Brush your teeth every morning and night with fluoride toothpaste. Floss one time each day. Stay active. Exercise for at least 30 minutes 5 or more days each week. Do not use any products that contain nicotine or tobacco, such as cigarettes, e-cigarettes, and chewing tobacco. If you need help quitting, ask your health care provider. Do not use drugs. If you are sexually active, practice safe sex. Use a condom or other form of protection to prevent STIs (sexually transmitted infections). If you do not wish to become pregnant, use a form of birth control. If you plan to become pregnant, see your health care provider for a prepregnancy visit. If told by your health care provider, take low-dose aspirin daily starting at age 57. Find healthy ways to cope with stress, such as: Meditation, yoga, or listening to music. Journaling. Talking to a trusted person. Spending time with friends and family. Safety Always wear your seat belt while driving or riding in a vehicle. Do not drive: If you have been drinking alcohol. Do not ride with someone who has been drinking. When you are tired or distracted. While texting. Wear a helmet and other protective equipment during sports activities. If you have firearms in your house, make sure you follow all gun safety procedures. What's next? Visit your health care provider once a year for an annual wellness visit. Ask your health care provider how often you should have your eyes and teeth checked. Stay up to date on all vaccines. This information is not intended to replace advice given to you by your health care provider. Make sure you discuss any questions you have with your healthcare provider. Document Revised: 03/22/2020 Document Reviewed: 02/27/2018 Elsevier Patient Education  2022 [employer].

## 2021-02-09 NOTE — Progress Notes (Signed)
Subjective:    Patient ID: Shelly Cohen, female    DOB: 1961/02/28, 60 y.o.   MRN: [id number]  Patient presents today for CPE and eval of chronic conditions  HPI HTN (hypertension), benign BP at goal with coreg, amlodipine, lisinopril and spironolactone BP Readings from Last 3 Encounters:  02/09/21 122/64  09/20/20 138/76  08/31/20 122/80   Maintain current medications Refills sent  Hypothyroidism Repeat TSH and T4: stable Maintain current levothyroxine dose Refill sent  Iron deficiency anemia Repeat cbc and iron panel She has schedule gastric sleeve procedure in 2weeks.  Vision:up to date Dental:up to date, every 47month Diet:follows diet provided by bariatric clinic Exercise:walking daily Weight:  Wt Readings from Last 3 Encounters:  02/09/21 205 lb 6.4 oz (93.2 kg)  02/06/21 208 lb 8 oz (94.6 kg)  09/21/20 219 lb 3.2 oz (99.4 kg)    Sexual History (orientation,birth control, marital status, STD):sexually active, no need for STD screen, up to date with mammogram and pelvic exam (by GYN)  Depression/Suicide[facility]/09[facility]2021 02/08/2020 08/11/2019 08/07/2018 01/08/2018 06/14/2017 10/19/2016  Decreased Interest 0 0 0 0 0 0 0  Down, Depressed, Hopeless 0 0 0 - 0 0 0  PHQ - 2 Score 0 0 0 0 0 0 0   Immunizations: (TDAP, Hep C screen, Pneumovax, Influenza, zoster)  Health Maintenance  Topic Date Due   Pneumococcal Vaccination (1 - PCV) Never done   Flu Shot  01/30/2021   Colon Cancer Screening  09/20/2022   Mammogram  11/26/2022   Tetanus Vaccine  12/27/2027   COVID-19 Vaccine  Completed   Hepatitis C Screening: USPSTF Recommendation to screen - Ages 18-79 yo.  Completed   HIV Screening  Completed   Zoster (Shingles) Vaccine  Completed   HPV Vaccine  Aged Out   Pap Smear  Discontinued   Fall Risk: Fall Risk  02/09/2021 02/08/2020 08/11/2019 08/07/2018 01/08/2018 06/14/2017 10/19/2016  Falls in the past year? 0 0 0 0 No Yes No  Number falls in past  yr: 0 0 - - - 1 -  Injury with Fall? 0 0 - - - Yes -  Comment - - - - - hit left side face -  Risk for fall due to : No Fall Risks - - - - - -  Follow up Falls evaluation completed - - - - - -   Medications and allergies reviewed with patient and updated if appropriate.  Patient Active Problem List   Diagnosis Date Noted   OSA (obstructive sleep apnea) 02/09/2020   DJD (degenerative joint disease) 02/09/2020   Prediabetes 08/13/2019   Primary insomnia 08/13/2019   Status post total replacement of right hip 09/12/2018   Obesity (BMI 30-39.9) 08/22/2018   Trigger thumb, left thumb 05/07/2018   OA (osteoarthritis) of hip 04/22/2018   Open angle primary glaucoma 01/08/2018   Thalassemia trait, alpha 06/29/2016   Iron deficiency anemia 06/29/2016   S/P bunionectomy 06/29/2016   HTN (hypertension), benign 05/18/2016   Hypothyroidism 05/18/2016   Current Outpatient Medications on File Prior to Visit  Medication Sig Dispense Refill   aspirin EC 81 MG tablet Take 81 mg by mouth daily. Swallow whole.     BIOTIN PO Take 1 tablet by mouth daily.     CALCIUM-VITAMIN D PO Take 1 tablet by mouth daily.     carvedilol (COREG) 25 MG tablet Take 1 tablet by mouth 2 (two) times daily. 180 tablet 2   diclofenac Sodium (VOLTAREN) 1 %  GEL Apply 2 g topically daily as needed (hip pain).     docusate sodium (COLACE) 100 MG capsule Take 100 mg by mouth every other day.     ferrous sulfate 325 (65 FE) MG tablet Take 325 mg by mouth daily with breakfast.     fluticasone (FLONASE) 50 MCG/ACT nasal spray Place 1 spray into both nostrils daily as needed for allergies or rhinitis.     KRILL OIL PO Take 1 capsule by mouth daily.     latanoprost (XALATAN) 0.005 % ophthalmic solution Place 1 drop into both eyes every evening (Patient taking differently: Place 1 drop into both eyes at bedtime.) 5 mL 6   lisinopril (ZESTRIL) 40 MG tablet TAKE 1 TABLET BY MOUTH ONCE A DAY 90 tablet 3   MELATONIN PO Take 1 tablet by  mouth at bedtime.     Multiple Vitamin (MULTI-VITAMINS) TABS Take 1 tablet by mouth daily.      spironolactone (ALDACTONE) 50 MG tablet TAKE 1 TABLET BY MOUTH ONCE A DAY 90 tablet 3   diclofenac (VOLTAREN) 75 MG EC tablet TAKE 1 TABLET BY MOUTH 2 TIMES DAILY BETWEEN MEALS AS NEEDED (Patient not taking: No sig reported) 60 tablet 1   mupirocin ointment (BACTROBAN) 2 % APPLY 2 TIMES DAILY FOR 4 DAYS (Patient not taking: No sig reported) 22 g 0   No current facility-administered medications on file prior to visit.    Past Medical History:  Diagnosis Date   Alpha thalassemia trait    Diabetes mellitus without complication ([place])    Fibroadenoma    Glaucoma    monitored every 23month, right eye worse than left    History of positive PPD, treatment status unknown    Hypertension    OSA (obstructive sleep apnea)    mild with AHI 8/hr   Thyroid disease     Past Surgical History:  Procedure Laterality Date   ABDOMINAL HYSTERECTOMY  1989   BREAST BIOPSY  2013   BUNIONECTOMY  10/2014   CHOLECYSTECTOMY  2017   COLONOSCOPY     GALLBLADDER SURGERY     hallux limi     LAPAROSCOPY     TOTAL HIP ARTHROPLASTY Right 09/12/2018   Procedure: RIGHT TOTAL HIP ARTHROPLASTY ANTERIOR APPROACH;  Surgeon: BMcarthur Rossetti MD;  Location: [facility];  Service: Orthopedics;  Laterality: Right;   TUBAL LIGATION      Social History   Socioeconomic History   Marital status: Married    Spouse name: Not on file   Number of children: Not on file   Years of education: Not on file   Highest education level: Not on file  Occupational History   Not on file  Tobacco Use   Smoking status: Never   Smokeless tobacco: Never  Vaping Use   Vaping Use: Never used  Substance and Sexual Activity   Alcohol use: No   Drug use: No   Sexual activity: Yes    Birth control/protection: Surgical  Other Topics Concern   Not on file  Social History Narrative   Not on file   Social Determinants of Health    Financial Resource Strain: Not on file  Food Insecurity: Not on file  Transportation Needs: Not on file  Physical Activity: Not on file  Stress: Not on file  Social Connections: Not on file    Family History  Problem Relation Age of Onset   Hypertension Mother    Anemia Mother    Arthritis Mother  rheumatoid arthritis   Kidney disease Mother    Cataracts Mother    Heart disease Mother    Hypertension Sister    Cataracts Sister    Hypertension Brother    Glaucoma Brother    Diabetes Brother    Cancer Maternal Grandmother        leukemia   Cancer Maternal Grandfather        lung   Glaucoma Maternal Aunt        Review of Systems  Constitutional:  Negative for fever, malaise/fatigue and weight loss.  HENT:  Negative for congestion and sore throat.   Eyes:        Negative for visual changes  Respiratory:  Negative for cough and shortness of breath.   Cardiovascular:  Negative for chest pain, palpitations and leg swelling.  Gastrointestinal:  Negative for blood in stool, constipation, diarrhea and heartburn.  Genitourinary:  Negative for dysuria, frequency and urgency.  Musculoskeletal:  Negative for falls, joint pain and myalgias.  Skin:  Negative for rash.  Neurological:  Negative for dizziness, sensory change and headaches.  Endo/Heme/Allergies:  Does not bruise/bleed easily.  Psychiatric/Behavioral:  Negative for depression, substance abuse and suicidal ideas. The patient is not nervous/anxious.    Objective:   Vitals:   02/09/21 1015  BP: 122/64  Pulse: 80  Temp: (!) 97 F (36.1 C)  SpO2: 98%    Body mass index is 37.57 kg/m.   Physical Examination:  Physical Exam Vitals reviewed.  Constitutional:      General: She is not in acute distress.    Appearance: She is well-developed.  HENT:     Right Ear: Tympanic membrane, ear canal and external ear normal.     Left Ear: Tympanic membrane, ear canal and external ear normal.  Eyes:      Extraocular Movements: Extraocular movements intact.     Conjunctiva/sclera: Conjunctivae normal.  Cardiovascular:     Rate and Rhythm: Normal rate and regular rhythm.     Pulses: Normal pulses.     Heart sounds: Normal heart sounds.  Pulmonary:     Effort: Pulmonary effort is normal. No respiratory distress.     Breath sounds: Normal breath sounds.  Chest:     Chest wall: No tenderness.  Abdominal:     General: Bowel sounds are normal.     Palpations: Abdomen is soft.  Genitourinary:    Comments: Deferred breast and pelvic exam to GYN Musculoskeletal:        General: Normal range of motion.     Cervical back: Normal range of motion and neck supple.     Right lower leg: No edema.     Left lower leg: No edema.  Skin:    General: Skin is warm and dry.  Neurological:     Mental Status: She is alert and oriented to person, place, and time.     Deep Tendon Reflexes: Reflexes are normal and symmetric.  Psychiatric:        Mood and Affect: Mood normal.        Behavior: Behavior normal.        Thought Content: Thought content normal.    ASSESSMENT and PLAN: This visit occurred during the SARS-CoV-2 public health emergency.  Safety protocols were in place, including screening questions prior to the visit, additional usage of staff PPE, and extensive cleaning of exam room while observing appropriate contact time as indicated for disinfecting solutions.   Carie was seen today for annual exam.  Diagnoses and all orders for this visit:  Encounter for preventative adult health care exam with abnormal findings -     Comprehensive metabolic panel -     TSH -     Urinalysis w microscopic + reflex cultur  HTN (hypertension), benign -     amLODipine (NORVASC) 10 MG tablet; TAKE 1 TABLET BY MOUTH DAILY  Hypothyroidism, unspecified type -     T4, free -     levothyroxine (SYNTHROID) 50 MCG tablet; TAKE 1 TABLET BY MOUTH DAILY BEFORE BREAKFAST  Iron deficiency anemia due to chronic blood  loss -     Iron, TIBC and Ferritin Panel -     CBC with Differential/Platelet  Prediabetes -     Hemoglobin A1c  Encounter for lipid screening for cardiovascular disease -     Lipid panel     Problem List Items Addressed This Visit       Cardiovascular and Mediastinum   HTN (hypertension), benign    BP at goal with coreg, amlodipine, lisinopril and spironolactone BP Readings from Last 3 Encounters:  02/09/21 122/64  09/20/20 138/76  08/31/20 122/80   Maintain current medications Refills sent      Relevant Medications   amLODipine (NORVASC) 10 MG tablet     Endocrine   Hypothyroidism    Repeat TSH and T4: stable Maintain current levothyroxine dose Refill sent      Relevant Medications   levothyroxine (SYNTHROID) 50 MCG tablet   Other Relevant Orders   T4, free     Other   Iron deficiency anemia    Repeat cbc and iron panel      Relevant Orders   Iron, TIBC and Ferritin Panel   CBC with Differential/Platelet   Prediabetes   Relevant Orders   Hemoglobin A1c (Completed)   Other Visit Diagnoses     Encounter for preventative adult health care exam with abnormal findings    -  Primary   Relevant Orders   Comprehensive metabolic panel (Completed)   TSH (Completed)   Urinalysis w microscopic + reflex cultur   Encounter for lipid screening for cardiovascular disease       Relevant Orders   Lipid panel (Completed)       Follow up: Return in about 6 months (around 08/12/2021) for HTN, hypothyroidism.  Wilfred Lacy, NP

## 2021-02-09 NOTE — Assessment & Plan Note (Addendum)
Repeat cbc and iron panel 

## 2021-02-09 NOTE — Assessment & Plan Note (Addendum)
Repeat TSH and T4: stable Maintain current levothyroxine dose Refill sent

## 2021-02-10 ENCOUNTER — Other Ambulatory Visit (HOSPITAL_COMMUNITY): Payer: Self-pay

## 2021-02-10 ENCOUNTER — Other Ambulatory Visit (INDEPENDENT_AMBULATORY_CARE_PROVIDER_SITE_OTHER): Payer: No Typology Code available for payment source

## 2021-02-10 ENCOUNTER — Other Ambulatory Visit: Payer: No Typology Code available for payment source

## 2021-02-10 DIAGNOSIS — E039 Hypothyroidism, unspecified: Secondary | ICD-10-CM

## 2021-02-10 LAB — T4, FREE: Free T4: 1.05 ng/dL (ref 0.60–1.60)

## 2021-02-10 NOTE — Patient Instructions (Addendum)
DUE TO COVID-19 ONLY ONE VISITOR IS ALLOWED TO COME WITH YOU AND STAY IN THE WAITING ROOM ONLY DURING PRE OP AND PROCEDURE DAY OF SURGERY. THE 2 VISITORS  MAY VISIT WITH YOU AFTER SURGERY IN YOUR PRIVATE ROOM DURING VISITING HOURS ONLY!  YOU NEED TO HAVE A COVID 19 TEST ON   8/18  THIS TEST MUST BE DONE BEFORE SURGERY,                Shelly Cohen     Your procedure is scheduled on: 02/20/21   Report to [place] [facility]  Entrance   Report to short stay at 5:15 AM     Call this number if you have problems the morning of surgery [place], [place].     NO SOLID FOOD AFTER 6:00 PM THE NIGHT BEFORE YOUR SURGERY.   YOU MAY DRINK CLEAR FLUIDS. Until4:30 am  THE G2 DRINK YOU DRINK BEFORE YOU LEAVE HOME WILL BE THE LAST FLUIDS YOU DRINK BEFORE SURGERY.  PAIN IS EXPECTED AFTER SURGERY AND WILL NOT BE COMPLETELY ELIMINATED.   AMBULATION AND TYLENOL WILL HELP REDUCE INCISIONAL AND GAS PAIN. MOVEMENT IS KEY!  YOU ARE EXPECTED TO BE OUT OF BED WITHIN 4 HOURS OF ADMISSION TO YOUR PATIENT ROOM.  SITTING IN THE RECLINER THROUGHOUT THE DAY IS IMPORTANT FOR DRINKING FLUIDS AND MOVING GAS THROUGHOUT THE GI TRACT.  COMPRESSION STOCKINGS SHOULD BE WORN [place] UNLESS YOU ARE WALKING.   INCENTIVE SPIROMETER SHOULD BE USED EVERY HOUR WHILE AWAKE TO DECREASE POST-OPERATIVE COMPLICATIONS SUCH AS PNEUMONIA.  WHEN DISCHARGED HOME, IT IS IMPORTANT TO CONTINUE TO WALK EVERY HOUR AND USE THE INCENTIVE SPIROMETER EVERY HOUR.    Take these medicines the morning of surgery with A SIP OF WATER: Carvedilol, Amlodipine Levothyroxine   Stop taking _ASA__________on _8/15/22_________as instructed by __Dr. Wilson___________.  Stop taking ____________as directed by your Surgeon/Cardiologist.  Contact your Surgeon/Cardiologist for instructions on Anticoagulant Therapy prior to surgery.                                   You may not have any metal on your body including hair pins and              piercings  Do not wear jewelry, make-up, lotions, powders or perfumes, deodorant             Do not wear nail polish on your fingernails.  Do not shave  48 hours prior to surgery.                 Do not bring valuables to the hospital. [place].  Contacts, dentures or bridgework may not be worn into surgery.                 Please read over the following fact sheets you were given: _____________________________________________________________________             [facility] - Preparing for Surgery Before surgery, you can play an important role.  Because skin is not sterile, your skin needs to be as free of germs as possible.  You can reduce the number of germs on your skin by washing with CHG (chlorahexidine gluconate)  soap before surgery.  CHG is an antiseptic cleaner which kills germs and bonds with the skin to continue killing germs even after washing. Please DO NOT use if you have an allergy to CHG or antibacterial soaps.  If your skin becomes reddened/irritated stop using the CHG and inform your nurse when you arrive at Short Stay. Do not shave (including legs and underarms) for at least 48 hours prior to the first CHG shower.   Please follow these instructions carefully:  1.  Shower with CHG Soap the night before surgery and the  morning of Surgery.  2.  If you choose to wash your hair, wash your hair first as usual with your  normal  shampoo.  3.  After you shampoo, rinse your hair and body thoroughly to remove the  shampoo.                                        4.  Use CHG as you would any other liquid soap.  You can apply chg directly  to the skin and wash                       Gently with a scrungie or clean washcloth.  5.  Apply the CHG Soap to your body ONLY FROM THE NECK DOWN.   Do not use on face/ open                            Wound or open sores. Avoid contact with eyes, ears mouth and genitals (private parts).                       Wash face,  Genitals (private parts) with your normal soap.             6.  Wash thoroughly, paying special attention to the area where your surgery  will be performed.  7.  Thoroughly rinse your body with warm water from the neck down.  8.  DO NOT shower/wash with your normal soap after using and rinsing off  the CHG Soap.             9.  Pat yourself dry with a clean towel.            10.  Wear clean pajamas.            11.  Place clean sheets on your bed the night of your first shower and do not  sleep with pets. Day of Surgery : Do not apply any lotions/deodorants the morning of surgery.  Please wear clean clothes to the hospital/surgery center.  FAILURE TO FOLLOW THESE INSTRUCTIONS MAY RESULT IN THE CANCELLATION OF YOUR SURGERY PATIENT SIGNATURE_________________________________  NURSE SIGNATURE__________________________________  ________________________________________________________________________   [name]  An incentive spirometer is a tool that can help keep your lungs clear and active. This tool measures how well you are filling your lungs with each breath. Taking long deep breaths may help reverse or decrease the chance of developing breathing (pulmonary) problems (especially infection) following: A long period of time when you are unable to move or be active. BEFORE THE PROCEDURE  If the spirometer includes an indicator to show your best effort, your nurse or respiratory therapist will set it to a desired goal. If possible, sit up straight or lean  slightly forward. Try not to slouch. Hold the incentive spirometer in an upright position. INSTRUCTIONS FOR USE  Sit on the edge of your bed if possible, or sit up as far as you can in bed or on a chair. Hold the incentive spirometer in an upright position. Breathe out normally. Place the mouthpiece  in your mouth and seal your lips tightly around it. Breathe in slowly and as deeply as possible, raising the piston or the ball toward the top of the column. Hold your breath for 3-5 seconds or for as long as possible. Allow the piston or ball to fall to the bottom of the column. Remove the mouthpiece from your mouth and breathe out normally. Rest for a few seconds and repeat Steps 1 through 7 at least 10 times every 1-2 hours when you are awake. Take your time and take a few normal breaths between deep breaths. The spirometer may include an indicator to show your best effort. Use the indicator as a goal to work toward during each repetition. After each set of 10 deep breaths, practice coughing to be sure your lungs are clear. If you have an incision (the cut made at the time of surgery), support your incision when coughing by placing a pillow or rolled up towels firmly against it. Once you are able to get out of bed, walk around indoors and cough well. You may stop using the incentive spirometer when instructed by your caregiver.  RISKS AND COMPLICATIONS Take your time so you do not get dizzy or light-headed. If you are in pain, you may need to take or ask for pain medication before doing incentive spirometry. It is harder to take a deep breath if you are having pain. AFTER USE Rest and breathe slowly and easily. It can be helpful to keep track of a log of your progress. Your caregiver can provide you with a simple table to help with this. If you are using the spirometer at home, follow these instructions: [place] IF:  You are having difficultly using the spirometer. You have trouble using the spirometer as often as instructed. Your pain medication is not giving enough relief while using the spirometer. You develop fever of 100.5 F (38.1 C) or higher. SEEK IMMEDIATE MEDICAL CARE IF:  You cough up bloody sputum that had not been present before. You develop fever of 102 F (38.9 C)  or greater. You develop worsening pain at or near the incision site. MAKE SURE YOU:  Understand these instructions. Will watch your condition. Will get help right away if you are not doing well or get worse. Document Released: 10/29/2006 Document Revised: 09/10/2011 Document Reviewed: 12/30/2006 [facility] Patient Information 2014 [place], [place].   ________________________________________________________________________

## 2021-02-11 LAB — URINALYSIS W MICROSCOPIC + REFLEX CULTURE
Bilirubin Urine: NEGATIVE
Glucose, UA: NEGATIVE
Hgb urine dipstick: NEGATIVE
Hyaline Cast: NONE SEEN /LPF
Leukocyte Esterase: NEGATIVE
Nitrites, Initial: NEGATIVE
Protein, ur: NEGATIVE
RBC / HPF: NONE SEEN /HPF (ref 0–2)
Specific Gravity, Urine: 1.016 (ref 1.001–1.035)
pH: 5.5 (ref 5.0–8.0)

## 2021-02-11 LAB — NO CULTURE INDICATED

## 2021-02-11 LAB — IRON,TIBC AND FERRITIN PANEL
%SAT: 14 % (calc) — ABNORMAL LOW (ref 16–45)
Ferritin: 105 ng/mL (ref 16–232)
Iron: 45 ug/dL (ref 45–160)
TIBC: 322 mcg/dL (calc) (ref 250–450)

## 2021-02-13 ENCOUNTER — Encounter (HOSPITAL_COMMUNITY): Payer: Self-pay

## 2021-02-13 ENCOUNTER — Encounter (HOSPITAL_COMMUNITY)
Admission: RE | Admit: 2021-02-13 | Discharge: 2021-02-13 | Disposition: A | Discharge status: Home / Self Care | Payer: No Typology Code available for payment source | Source: Ambulatory Visit | Attending: General Surgery | Admitting: General Surgery

## 2021-02-13 ENCOUNTER — Other Ambulatory Visit: Payer: Self-pay

## 2021-02-13 DIAGNOSIS — Z01818 Encounter for other preprocedural examination: Secondary | ICD-10-CM | POA: Diagnosis present

## 2021-02-13 HISTORY — DX: Unspecified osteoarthritis, unspecified site: M19.90

## 2021-02-13 HISTORY — DX: Prediabetes: R73.03

## 2021-02-13 NOTE — Progress Notes (Signed)
Covid test 02/16/21  COVID Vaccine Completed:yes Date COVID Vaccine completed:07/21/19, boosters, 04/01/20, 10/21/20 COVID vaccine manufacturer: [place]    PCP - Baldo Ash Nche LOV 02/09/21 Cardiologist - Dr. Ashok Norris LOV 08/31/18  Chest x-ray - 08/31/20-epic EKG - 08/17/20-epic Stress Test - no ECHO - 08/26/18-epic Cardiac Cath - no Pacemaker/ICD device last checked:NA  Sleep Study - yes CPAP - mild . Didn't need a C-pap  Fasting Blood Sugar - pre diabetic Checks Blood Sugar _____ times a day  Blood Thinner Instructions:ASA 81/ Nche Aspirin Instructions:Stop  7 days prior to DOS/ Dr. Redmond Pulling Last Dose:02/13/21  Anesthesia review: no  Patient denies shortness of breath, fever, cough and chest pain at PAT appointment No SOB with any activitites  Patient verbalized understanding of instructions that were given to them at the PAT appointment. Patient was also instructed that they will need to review over the PAT instructions again at home before surgery. yes

## 2021-02-14 NOTE — Anesthesia Preprocedure Evaluation (Deleted)
Anesthesia Evaluation  Patient identified by MRN, date of birth, ID band Patient awake    Reviewed: Allergy & Precautions, NPO status , Patient's Chart, lab work & pertinent test results  History of Anesthesia Complications (+) PONV and history of anesthetic complications  Airway Mallampati: II  TM Distance: >3 FB Neck ROM: Full    Dental no notable dental hx.    Pulmonary neg pulmonary ROS,    Pulmonary exam normal        Cardiovascular hypertension, Pt. on medications and Pt. on home beta blockers  Rhythm:Regular Rate:Normal     Neuro/Psych negative neurological ROS  negative psych ROS   GI/Hepatic Neg liver ROS, GERD  ,  Endo/Other  Hypothyroidism   Renal/GU negative Renal ROS  negative genitourinary   Musculoskeletal  (+) Arthritis , Fibromyalgia -  Abdominal Normal abdominal exam  (+)   Peds  Hematology negative hematology ROS (+)   Anesthesia Other Findings   Reproductive/Obstetrics                            Anesthesia Physical Anesthesia Plan  ASA: 2  Anesthesia Plan: General   Post-op Pain Management:    Induction: Intravenous  PONV Risk Score and Plan: 4 or greater and Ondansetron, Dexamethasone, Treatment may vary due to age or medical condition and Amisulpride  Airway Management Planned: Mask and Oral ETT  Additional Equipment: None  Intra-op Plan:   Post-operative Plan: Extubation in OR  Informed Consent: I have reviewed the patients History and Physical, chart, labs and discussed the procedure including the risks, benefits and alternatives for the proposed anesthesia with the patient or authorized representative who has indicated his/her understanding and acceptance.     Dental advisory given  Plan Discussed with: CRNA  Anesthesia Plan Comments: (See APP note by A. Kabbe, FNP   Lab Results      Component                Value               Date                       WBC                      7.9                 01/10/2022                HGB                      13.6                01/10/2022                HCT                      39.3                01/10/2022                MCV                      87.9                01/10/2022                  PLT                      373                 01/10/2022           Lab Results      Component                Value               Date                      NA                       138                 01/10/2022                K                        3.8                 01/10/2022                CO2                      27                  01/10/2022                GLUCOSE                  96                  01/10/2022                BUN                      8                   01/10/2022                CREATININE               0.89                01/10/2022                CALCIUM                  9.6                 01/10/2022                EGFR                     70 (L)              07/06/2016                GFRNONAA                 >60                 01/10/2022          )       Anesthesia Quick Evaluation  

## 2021-02-14 NOTE — Progress Notes (Signed)
Anesthesia Chart Review:   Case: [id number] Date/Time: 02/20/21 0700   Procedures:      LAPAROSCOPIC GASTRIC SLEEVE RESECTION     UPPER GI ENDOSCOPY   Anesthesia type: General   Pre-op diagnosis: MORBID OBESITY   Location[facility] ROOM 02 / [facility]   Surgeons: Shelly Pickerel, MD       DISCUSSION: Pt is 59 years with hx HTN, pre-diabetes, OSA  VS: BP (!) 138/58   Pulse 80   Temp 36.4 C (Oral)   Resp 20   Ht '5\' 2"'$  (1.575 m)   Wt 91.6 kg   SpO2 98%   BMI 36.95 kg/m   PROVIDERS: - PCP is Nche, Charlene Brooke, NP - Cardiologist is Shelly Him, MD. Shelly Cohen pt for surgery at last office visit 08/30/20  LABS:  - CBC:  Will be obtained day of surgery  - CMP 02/09/21: normal - HbA1c 02/09/21: 6.2 - UA 02/09/21: many bacteria  IMAGES: CXR 08/31/20: Stable chest.  No active cardiopulmonary process.  EKG 08/17/20: Sinus rhythm. Baseline wander in lead(s) V1   CV: Echo 08/26/18:  1. The left ventricle has normal systolic function with an ejection fraction of 60-65%. The cavity size was normal. There is mildly increased left ventricular wall thickness. Left ventricular diastolic Doppler parameters are consistent with  pseudonormalization Indeterminent filling pressures.   2. The right ventricle has normal systolic function. The cavity was normal. There is no increase in right ventricular wall thickness.   3. The mitral valve is normal in structure.   4. The tricuspid valve is normal in structure.   5. The aortic valve is tricuspid.   6. The pulmonic valve was normal in structure   Past Medical History:  Diagnosis Date   Alpha thalassemia trait    Arthritis    hips   Fibroadenoma    Glaucoma    monitored every 76month, right eye worse than left    History of positive PPD, treatment status unknown    Hypertension    OSA (obstructive sleep apnea)    mild with AHI 8/hr   Pre-diabetes    Thyroid disease     Past Surgical History:  Procedure Laterality Date   ABDOMINAL HYSTERECTOMY   2013   BREAST BIOPSY  2013   BUNIONECTOMY  10/2014   CHOLECYSTECTOMY  2015   COLONOSCOPY     hallux limi Right 2019   great  toe revision   TOTAL HIP ARTHROPLASTY Right 09/12/2018   Procedure: RIGHT TOTAL HIP ARTHROPLASTY ANTERIOR APPROACH;  Surgeon: Shelly Rossetti MD;  Location: [facility];  Service: Orthopedics;  Laterality: Right;   TUBAL LIGATION  1990    MEDICATIONS:  amLODipine (NORVASC) 10 MG tablet   aspirin EC 81 MG tablet   BIOTIN PO   CALCIUM-VITAMIN D PO   carvedilol (COREG) 25 MG tablet   diclofenac (VOLTAREN) 75 MG EC tablet   diclofenac Sodium (VOLTAREN) 1 % GEL   docusate sodium (COLACE) 100 MG capsule   ferrous sulfate 325 (65 FE) MG tablet   fluticasone (FLONASE) 50 MCG/ACT nasal spray   KRILL OIL PO   latanoprost (XALATAN) 0.005 % ophthalmic solution   levothyroxine (SYNTHROID) 50 MCG tablet   lisinopril (ZESTRIL) 40 MG tablet   MELATONIN PO   Multiple Vitamin (MULTI-VITAMINS) TABS   mupirocin ointment (BACTROBAN) 2 %   spironolactone (ALDACTONE) 50 MG tablet   No current facility-administered medications for this encounter.    If labs acceptable day of surgery, I anticipate pt  can proceed with surgery as scheduled.  Shelly Cass, PhD, FNP-BC [facility] Short Stay Surgical Center/Anesthesiology Phone: [phone] 02/14/2021 12:44 PM

## 2021-02-15 ENCOUNTER — Ambulatory Visit: Payer: Self-pay | Admitting: General Surgery

## 2021-02-16 ENCOUNTER — Other Ambulatory Visit: Payer: Self-pay | Admitting: General Surgery

## 2021-02-16 LAB — SARS CORONAVIRUS 2 (TAT 6-24 HRS): SARS Coronavirus 2: NEGATIVE

## 2021-02-17 ENCOUNTER — Ambulatory Visit: Payer: Self-pay | Admitting: General Surgery

## 2021-02-17 NOTE — H&P (View-Only) (Signed)
PROVIDER:  Shamika Pedregon Sherril Cong, MD   MRN: [id number] DOB: 06-27-1961 DATE OF ENCOUNTER: 02/17/2021 Subjective    Chief Complaint: Weight Loss       History of Present Illness: Shelly Cohen is a 60 y.o. female who is seen today for long-term follow-up regarding her obesity, hypertension, obstructive sleep apnea, prediabetes, right hip osteoarthritis.  She has completed the bariatric surgery evaluation process.  I initially met her in February 2022.Marland Kitchen  Her initial visit weight was 215 pounds.  Her weight today is 203 pounds.   She received psychological clearance and April.  She also received cardiology clearance in March 2022.  Upper GI, chest x-ray and mammogram were within normal limits.  She did have a CT angio of her chest in February 2022 which was unremarkable but it showed a tiny hiatal hernia.  She states that she was having some anxiety.  Otherwise she denies any medical changes.  No chest pain, chest pressure, shortness of breath, no abdominal pain.  No heartburn.       Review of Systems: A complete review of systems was obtained from the patient.  I have reviewed this information and discussed as appropriate with the patient.  See HPI as well for other ROS.   ROS      Medical History: Past Medical History      Past Medical History:  Diagnosis Date   Allergic state     Glaucoma (increased eye pressure)     Hypertension     Sleep apnea     Thyroid disease             Patient Active Problem List  Diagnosis   HTN (hypertension), benign   Hypothyroidism   DJD (degenerative joint disease)   Open angle primary glaucoma   OSA (obstructive sleep apnea)   Prediabetes   Thalassemia trait, alpha      Past Surgical History       Past Surgical History:  Procedure Laterality Date   BUNION CORRECTION       CHOLECYSTECTOMY       HYSTERECTOMY       INCISION TENDON SHEATH FOR TRIGGER FINGER            Allergies       Allergies  Allergen Reactions    Diphenhydramine Hcl Hallucination and Itching      hallucination    Hydrocodone Nausea   Hydromorphone Hcl Itching   Oxycodone Nausea And Vomiting   Penicillin Rash   Azithromycin Itching and Rash   Cephalexin Rash   Penicillin G Rash      Did it involve swelling of the face/tongue/throat, SOB, or low BP? No Did it involve sudden or severe rash/hives, skin peeling, or any reaction on the inside of your mouth or nose? No Did you need to seek medical attention at a hospital or doctor's office? No When did it last happen?      childhood If all above answers are "NO", may proceed with cephalosporin use.   Zolpidem Itching and Rash              Current Outpatient Medications on File Prior to Visit  Medication Sig Dispense Refill   aspirin 81 MG EC tablet Take 81 mg by mouth once daily.       CALCIUM CARBONATE/VITAMIN D3 (CALCIUM 500 + D ORAL) Take by mouth.       ferrous sulfate 325 (65 FE) MG tablet Take 325 mg by mouth daily with breakfast.  levothyroxine (SYNTHROID) 50 MCG tablet Take by mouth       levothyroxine (SYNTHROID, LEVOTHROID) 50 MCG tablet Take 50 mcg by mouth once daily. Take on an empty stomach with a glass of water at least 30-60 minutes before breakfast.       multivitamin tablet Take 1 tablet by mouth once daily.       naltrexone-bupropion 8-90 mg TbER Take by mouth 2 (two) times daily.       omega-3 fatty acids-fish oil 300-1,000 mg capsule Take 1 capsule by mouth.       potassium chloride (KLOR-CON) 10 MEQ ER tablet Take 10 mEq by mouth once daily.        No current facility-administered medications on file prior to visit.      Family History       Family History  Problem Relation Age of Onset   Obesity Mother     High blood pressure (Hypertension) Mother     Obesity Sister     High blood pressure (Hypertension) Sister     High blood pressure (Hypertension) Brother     Obesity Brother     Diabetes Brother     Diabetes Maternal Uncle           Social History       Tobacco Use  Smoking Status Never Smoker  Smokeless Tobacco Never Used      Social History  Social History        Socioeconomic History   Marital status: Married  Tobacco Use   Smoking status: Never Smoker   Smokeless tobacco: Never Used  Substance and Sexual Activity   Alcohol use: No   Drug use: Never        Objective:         Vitals:    02/15/21 1118  BP: (!) 146/80  Pulse: 86  Temp: 36.6 C (97.9 F)  SpO2: 98%  Weight: 92.1 kg (203 lb)  Height: 152.4 cm (5')    Body mass index is 39.65 kg/m.   Gen: alert, NAD, non-toxic appearing, obese Pupils: equal, no scleral icterus Pulm: Lungs clear to auscultation, symmetric chest rise CV: regular rate and rhythm Abd: soft, nontender, nondistended.. No cellulitis. No incisional hernia Ext: no edema,  Skin: no rash, no jaundice     Labs, Imaging and Diagnostic Testing:   Discussed above     Assessment and Plan:  Diagnoses and all orders for this visit:   HTN (hypertension), benign   Hypothyroidism, unspecified type   Primary open angle glaucoma of left eye, unspecified glaucoma stage   OSA (obstructive sleep apnea)   Prediabetes   Thalassemia trait, alpha   Primary osteoarthritis of right hip   Class 2 severe obesity with serious comorbidity and body mass index (BMI) of 39.0 to 39.9 in adult, unspecified obesity type (CMS-HCC)       She is completed the bariatric surgery evaluation process.  We reviewed her work-up.  We discussed the findings of a small hiatal hernia.  I discussed that we would test for 1 intraoperatively.  If found to have 1 we would repair it.  I discussed what that would involve.  She has attended her preoperative education class.  She signed the surgical consent form.  Plan is for laparoscopic sleeve gastrectomy with possible hiatal hernia repair.   We rediscussed the typical hospitalization as well as the typical recovery.  We discussed the typical  issues that we see after surgery.  All of her questions  were asked and answered   This patient encounter took 22 minutes today to perform the following: take history, perform exam, review outside records, interpret imaging, counsel the patient on their diagnosis    No follow-ups on file.   Leighton Ruff. Jeilyn Reznik MD FACS General, Minimally Invasive, & Bariatric Surgery

## 2021-02-17 NOTE — H&P (Signed)
PROVIDER:  Shanisha Lech Sherril Cong, MD   MRN: [id number] DOB: Nov 02, 1960 DATE OF ENCOUNTER: 02/17/2021 Subjective    Chief Complaint: Weight Loss       History of Present Illness: Shelly Cohen is a 60 y.o. female who is seen today for long-term follow-up regarding her obesity, hypertension, obstructive sleep apnea, prediabetes, right hip osteoarthritis.  She has completed the bariatric surgery evaluation process.  I initially met her in February 2022.Marland Kitchen  Her initial visit weight was 215 pounds.  Her weight today is 203 pounds.   She received psychological clearance and April.  She also received cardiology clearance in March 2022.  Upper GI, chest x-ray and mammogram were within normal limits.  She did have a CT angio of her chest in February 2022 which was unremarkable but it showed a tiny hiatal hernia.  She states that she was having some anxiety.  Otherwise she denies any medical changes.  No chest pain, chest pressure, shortness of breath, no abdominal pain.  No heartburn.       Review of Systems: A complete review of systems was obtained from the patient.  I have reviewed this information and discussed as appropriate with the patient.  See HPI as well for other ROS.   ROS      Medical History: Past Medical History      Past Medical History:  Diagnosis Date   Allergic state     Glaucoma (increased eye pressure)     Hypertension     Sleep apnea     Thyroid disease             Patient Active Problem List  Diagnosis   HTN (hypertension), benign   Hypothyroidism   DJD (degenerative joint disease)   Open angle primary glaucoma   OSA (obstructive sleep apnea)   Prediabetes   Thalassemia trait, alpha      Past Surgical History       Past Surgical History:  Procedure Laterality Date   BUNION CORRECTION       CHOLECYSTECTOMY       HYSTERECTOMY       INCISION TENDON SHEATH FOR TRIGGER FINGER            Allergies       Allergies  Allergen Reactions    Diphenhydramine Hcl Hallucination and Itching      hallucination    Hydrocodone Nausea   Hydromorphone Hcl Itching   Oxycodone Nausea And Vomiting   Penicillin Rash   Azithromycin Itching and Rash   Cephalexin Rash   Penicillin G Rash      Did it involve swelling of the face/tongue/throat, SOB, or low BP? No Did it involve sudden or severe rash/hives, skin peeling, or any reaction on the inside of your mouth or nose? No Did you need to seek medical attention at a hospital or doctor's office? No When did it last happen?      childhood If all above answers are "NO", may proceed with cephalosporin use.   Zolpidem Itching and Rash              Current Outpatient Medications on File Prior to Visit  Medication Sig Dispense Refill   aspirin 81 MG EC tablet Take 81 mg by mouth once daily.       CALCIUM CARBONATE/VITAMIN D3 (CALCIUM 500 + D ORAL) Take by mouth.       ferrous sulfate 325 (65 FE) MG tablet Take 325 mg by mouth daily with breakfast.  levothyroxine (SYNTHROID) 50 MCG tablet Take by mouth       levothyroxine (SYNTHROID, LEVOTHROID) 50 MCG tablet Take 50 mcg by mouth once daily. Take on an empty stomach with a glass of water at least 30-60 minutes before breakfast.       multivitamin tablet Take 1 tablet by mouth once daily.       naltrexone-bupropion 8-90 mg TbER Take by mouth 2 (two) times daily.       omega-3 fatty acids-fish oil 300-1,000 mg capsule Take 1 capsule by mouth.       potassium chloride (KLOR-CON) 10 MEQ ER tablet Take 10 mEq by mouth once daily.        No current facility-administered medications on file prior to visit.      Family History       Family History  Problem Relation Age of Onset   Obesity Mother     High blood pressure (Hypertension) Mother     Obesity Sister     High blood pressure (Hypertension) Sister     High blood pressure (Hypertension) Brother     Obesity Brother     Diabetes Brother     Diabetes Maternal Uncle           Social History       Tobacco Use  Smoking Status Never Smoker  Smokeless Tobacco Never Used      Social History  Social History        Socioeconomic History   Marital status: Married  Tobacco Use   Smoking status: Never Smoker   Smokeless tobacco: Never Used  Substance and Sexual Activity   Alcohol use: No   Drug use: Never        Objective:         Vitals:    02/15/21 1118  BP: (!) 146/80  Pulse: 86  Temp: 36.6 C (97.9 F)  SpO2: 98%  Weight: 92.1 kg (203 lb)  Height: 152.4 cm (5')    Body mass index is 39.65 kg/m.   Gen: alert, NAD, non-toxic appearing, obese Pupils: equal, no scleral icterus Pulm: Lungs clear to auscultation, symmetric chest rise CV: regular rate and rhythm Abd: soft, nontender, nondistended.. No cellulitis. No incisional hernia Ext: no edema,  Skin: no rash, no jaundice     Labs, Imaging and Diagnostic Testing:   Discussed above     Assessment and Plan:  Diagnoses and all orders for this visit:   HTN (hypertension), benign   Hypothyroidism, unspecified type   Primary open angle glaucoma of left eye, unspecified glaucoma stage   OSA (obstructive sleep apnea)   Prediabetes   Thalassemia trait, alpha   Primary osteoarthritis of right hip   Class 2 severe obesity with serious comorbidity and body mass index (BMI) of 39.0 to 39.9 in adult, unspecified obesity type (CMS-HCC)       She is completed the bariatric surgery evaluation process.  We reviewed her work-up.  We discussed the findings of a small hiatal hernia.  I discussed that we would test for 1 intraoperatively.  If found to have 1 we would repair it.  I discussed what that would involve.  She has attended her preoperative education class.  She signed the surgical consent form.  Plan is for laparoscopic sleeve gastrectomy with possible hiatal hernia repair.   We rediscussed the typical hospitalization as well as the typical recovery.  We discussed the typical  issues that we see after surgery.  All of her questions  were asked and answered   This patient encounter took 22 minutes today to perform the following: take history, perform exam, review outside records, interpret imaging, counsel the patient on their diagnosis    No follow-ups on file.   Leighton Ruff. Christan Ciccarelli MD FACS General, Minimally Invasive, & Bariatric Surgery

## 2021-02-19 MED ORDER — GENTAMICIN SULFATE 40 MG/ML IJ SOLN
1.5000 mg/kg | INTRAVENOUS | Status: AC
  Administered 2021-02-20: 140 mg via INTRAVENOUS
  Filled 2021-02-19: qty 3.5

## 2021-02-19 MED ORDER — BUPIVACAINE LIPOSOME 1.3 % IJ SUSP
20.0000 mL | Freq: Once | INTRAMUSCULAR | Status: DC
  Filled 2021-02-19: qty 20

## 2021-02-19 NOTE — Anesthesia Preprocedure Evaluation (Addendum)
Anesthesia Evaluation  Patient identified by MRN, date of birth, ID band Patient awake    Reviewed: Allergy & Precautions, NPO status , Patient's Chart, lab work & pertinent test results  History of Anesthesia Complications Negative for: history of anesthetic complications  Airway Mallampati: II  TM Distance: >3 FB Neck ROM: Full    Dental no notable dental hx. (+) Dental Advisory Given   Pulmonary neg pulmonary ROS,    Pulmonary exam normal        Cardiovascular hypertension, Pt. on medications and Pt. on home beta blockers Normal cardiovascular exam  Echo 08/26/18:  1. The left ventricle has normal systolic function with an ejection fraction of 60-65%. The cavity size was normal. There is mildly increased left ventricular wall thickness. Left ventricular diastolic Doppler parameters are consistent with  pseudonormalization Indeterminent filling pressures.  2. The right ventricle has normal systolic function. The cavity was normal. There is no increase in right ventricular wall thickness.  3. The mitral valve is normal in structure.  4. The tricuspid valve is normal in structure.  5. The aortic valve is tricuspid.  6. The pulmonic valve was normal in structure    Neuro/Psych negative neurological ROS  negative psych ROS   GI/Hepatic negative GI ROS, Neg liver ROS,   Endo/Other  Hypothyroidism   Renal/GU negative Renal ROS  negative genitourinary   Musculoskeletal negative musculoskeletal ROS (+)   Abdominal   Peds negative pediatric ROS (+)  Hematology negative hematology ROS (+) anemia ,   Anesthesia Other Findings   Reproductive/Obstetrics negative OB ROS                            Anesthesia Physical  Anesthesia Plan  ASA: 2  Anesthesia Plan: General   Post-op Pain Management:    Induction: Intravenous  PONV Risk Score and Plan: 4 or greater and Ondansetron,  Dexamethasone, Scopolamine patch - Pre-op and Midazolam  Airway Management Planned: Oral ETT  Additional Equipment:   Intra-op Plan:   Post-operative Plan: Extubation in OR  Informed Consent: I have reviewed the patients History and Physical, chart, labs and discussed the procedure including the risks, benefits and alternatives for the proposed anesthesia with the patient or authorized representative who has indicated his/her understanding and acceptance.     Dental advisory given  Plan Discussed with: Anesthesiologist and CRNA  Anesthesia Plan Comments:        Anesthesia Quick Evaluation

## 2021-02-20 ENCOUNTER — Other Ambulatory Visit: Payer: Self-pay

## 2021-02-20 ENCOUNTER — Inpatient Hospital Stay (HOSPITAL_COMMUNITY): Payer: No Typology Code available for payment source | Admitting: Emergency Medicine

## 2021-02-20 ENCOUNTER — Encounter (HOSPITAL_COMMUNITY): Payer: Self-pay | Admitting: General Surgery

## 2021-02-20 ENCOUNTER — Inpatient Hospital Stay (HOSPITAL_COMMUNITY)
Admission: RE | Admit: 2021-02-20 | Discharge: 2021-02-21 | DRG: 621 | Disposition: A | Discharge status: Home / Self Care | Payer: No Typology Code available for payment source | Attending: General Surgery | Admitting: General Surgery

## 2021-02-20 ENCOUNTER — Encounter (HOSPITAL_COMMUNITY)
Admission: RE | Disposition: A | Discharge status: Home / Self Care | Payer: Self-pay | Source: Home / Self Care | Attending: General Surgery

## 2021-02-20 DIAGNOSIS — D563 Thalassemia minor: Secondary | ICD-10-CM | POA: Diagnosis present

## 2021-02-20 DIAGNOSIS — Z885 Allergy status to narcotic agent status: Secondary | ICD-10-CM | POA: Diagnosis not present

## 2021-02-20 DIAGNOSIS — Z9049 Acquired absence of other specified parts of digestive tract: Secondary | ICD-10-CM

## 2021-02-20 DIAGNOSIS — Z881 Allergy status to other antibiotic agents status: Secondary | ICD-10-CM

## 2021-02-20 DIAGNOSIS — Z79890 Hormone replacement therapy (postmenopausal): Secondary | ICD-10-CM

## 2021-02-20 DIAGNOSIS — Z880 Allergy status to penicillin: Secondary | ICD-10-CM

## 2021-02-20 DIAGNOSIS — F419 Anxiety disorder, unspecified: Secondary | ICD-10-CM | POA: Diagnosis present

## 2021-02-20 DIAGNOSIS — Z8249 Family history of ischemic heart disease and other diseases of the circulatory system: Secondary | ICD-10-CM

## 2021-02-20 DIAGNOSIS — R7303 Prediabetes: Secondary | ICD-10-CM | POA: Diagnosis present

## 2021-02-20 DIAGNOSIS — G4733 Obstructive sleep apnea (adult) (pediatric): Secondary | ICD-10-CM | POA: Diagnosis present

## 2021-02-20 DIAGNOSIS — Z9884 Bariatric surgery status: Secondary | ICD-10-CM

## 2021-02-20 DIAGNOSIS — E039 Hypothyroidism, unspecified: Secondary | ICD-10-CM | POA: Diagnosis present

## 2021-02-20 DIAGNOSIS — Z888 Allergy status to other drugs, medicaments and biological substances status: Secondary | ICD-10-CM | POA: Diagnosis not present

## 2021-02-20 DIAGNOSIS — H401120 Primary open-angle glaucoma, left eye, stage unspecified: Secondary | ICD-10-CM | POA: Diagnosis present

## 2021-02-20 DIAGNOSIS — M1611 Unilateral primary osteoarthritis, right hip: Secondary | ICD-10-CM | POA: Diagnosis present

## 2021-02-20 DIAGNOSIS — Z7982 Long term (current) use of aspirin: Secondary | ICD-10-CM

## 2021-02-20 DIAGNOSIS — I10 Essential (primary) hypertension: Secondary | ICD-10-CM | POA: Diagnosis present

## 2021-02-20 DIAGNOSIS — Z6839 Body mass index (BMI) 39.0-39.9, adult: Secondary | ICD-10-CM

## 2021-02-20 DIAGNOSIS — Z90710 Acquired absence of both cervix and uterus: Secondary | ICD-10-CM | POA: Diagnosis not present

## 2021-02-20 HISTORY — PX: UPPER GI ENDOSCOPY: SHX6162

## 2021-02-20 LAB — CBC WITH DIFFERENTIAL/PLATELET
Abs Immature Granulocytes: 0.02 10*3/uL (ref 0.00–0.07)
Basophils Absolute: 0 10*3/uL (ref 0.0–0.1)
Basophils Relative: 1 %
Eosinophils Absolute: 0.2 10*3/uL (ref 0.0–0.5)
Eosinophils Relative: 2 %
HCT: 39.1 % (ref 36.0–46.0)
Hemoglobin: 12.3 g/dL (ref 12.0–15.0)
Immature Granulocytes: 0 %
Lymphocytes Relative: 24 %
Lymphs Abs: 1.9 10*3/uL (ref 0.7–4.0)
MCH: 25.4 pg — ABNORMAL LOW (ref 26.0–34.0)
MCHC: 31.5 g/dL (ref 30.0–36.0)
MCV: 80.8 fL (ref 80.0–100.0)
Monocytes Absolute: 0.6 10*3/uL (ref 0.1–1.0)
Monocytes Relative: 7 %
Neutro Abs: 5 10*3/uL (ref 1.7–7.7)
Neutrophils Relative %: 66 %
Platelets: 269 10*3/uL (ref 150–400)
RBC: 4.84 MIL/uL (ref 3.87–5.11)
RDW: 16.3 % — ABNORMAL HIGH (ref 11.5–15.5)
WBC: 7.7 10*3/uL (ref 4.0–10.5)
nRBC: 0 % (ref 0.0–0.2)

## 2021-02-20 LAB — TYPE AND SCREEN
ABO/RH(D): A POS
Antibody Screen: NEGATIVE

## 2021-02-20 LAB — GLUCOSE, CAPILLARY: Glucose-Capillary: 187 mg/dL — ABNORMAL HIGH (ref 70–99)

## 2021-02-20 LAB — HEMOGLOBIN AND HEMATOCRIT, BLOOD
HCT: 33.5 % — ABNORMAL LOW (ref 36.0–46.0)
Hemoglobin: 10.4 g/dL — ABNORMAL LOW (ref 12.0–15.0)

## 2021-02-20 LAB — ABO/RH: ABO/RH(D): A POS

## 2021-02-20 SURGERY — GASTRECTOMY, SLEEVE, ROBOT-ASSISTED
Anesthesia: General | Site: Abdomen

## 2021-02-20 MED ORDER — PHENYLEPHRINE HCL-NACL 20-0.9 MG/250ML-% IV SOLN
INTRAVENOUS | Status: DC | PRN
  Administered 2021-02-20: 20 ug/min via INTRAVENOUS

## 2021-02-20 MED ORDER — FENTANYL CITRATE (PF) 100 MCG/2ML IJ SOLN
INTRAMUSCULAR | Status: AC
  Administered 2021-02-20: 50 ug via INTRAVENOUS
  Filled 2021-02-20: qty 2

## 2021-02-20 MED ORDER — CELECOXIB 200 MG PO CAPS
200.0000 mg | ORAL_CAPSULE | Freq: Once | ORAL | Status: AC
  Administered 2021-02-20: 200 mg via ORAL
  Filled 2021-02-20: qty 1

## 2021-02-20 MED ORDER — LEVOTHYROXINE SODIUM 50 MCG PO TABS
50.0000 ug | ORAL_TABLET | Freq: Every day | ORAL | Status: DC
  Administered 2021-02-21: 50 ug via ORAL
  Filled 2021-02-20: qty 1

## 2021-02-20 MED ORDER — PHENYLEPHRINE 40 MCG/ML (10ML) SYRINGE FOR IV PUSH (FOR BLOOD PRESSURE SUPPORT)
PREFILLED_SYRINGE | INTRAVENOUS | Status: DC | PRN
  Administered 2021-02-20: 80 ug via INTRAVENOUS
  Administered 2021-02-20 (×2): 40 ug via INTRAVENOUS

## 2021-02-20 MED ORDER — CHLORHEXIDINE GLUCONATE 0.12 % MT SOLN
15.0000 mL | Freq: Once | OROMUCOSAL | Status: AC
  Administered 2021-02-20: 15 mL via OROMUCOSAL

## 2021-02-20 MED ORDER — SCOPOLAMINE 1 MG/3DAYS TD PT72
1.0000 | MEDICATED_PATCH | TRANSDERMAL | Status: DC
  Administered 2021-02-20: 1.5 mg via TRANSDERMAL
  Filled 2021-02-20: qty 1

## 2021-02-20 MED ORDER — HYDRALAZINE HCL 20 MG/ML IJ SOLN: 10.0000 mg | INTRAMUSCULAR | Status: DC | PRN

## 2021-02-20 MED ORDER — ACETAMINOPHEN 500 MG PO TABS
1000.0000 mg | ORAL_TABLET | ORAL | Status: AC
  Administered 2021-02-20: 1000 mg via ORAL
  Filled 2021-02-20: qty 2

## 2021-02-20 MED ORDER — ACETAMINOPHEN 500 MG PO TABS: 1000.0000 mg | ORAL_TABLET | Freq: Once | ORAL | Status: DC

## 2021-02-20 MED ORDER — ACETAMINOPHEN 500 MG PO TABS
1000.0000 mg | ORAL_TABLET | Freq: Three times a day (TID) | ORAL | Status: DC
  Administered 2021-02-20 – 2021-02-21 (×2): 1000 mg via ORAL
  Filled 2021-02-20 (×2): qty 2

## 2021-02-20 MED ORDER — PHENYLEPHRINE 40 MCG/ML (10ML) SYRINGE FOR IV PUSH (FOR BLOOD PRESSURE SUPPORT)
PREFILLED_SYRINGE | INTRAVENOUS | Status: AC
  Filled 2021-02-20: qty 10

## 2021-02-20 MED ORDER — BUPIVACAINE LIPOSOME 1.3 % IJ SUSP
INTRAMUSCULAR | Status: DC | PRN
  Administered 2021-02-20: 20 mL

## 2021-02-20 MED ORDER — LATANOPROST 0.005 % OP SOLN
1.0000 [drp] | Freq: Every day | OPHTHALMIC | Status: DC
  Administered 2021-02-20: 1 [drp] via OPHTHALMIC
  Filled 2021-02-20: qty 2.5

## 2021-02-20 MED ORDER — PROPOFOL 10 MG/ML IV BOLUS
INTRAVENOUS | Status: DC | PRN
  Administered 2021-02-20: 50 mg via INTRAVENOUS
  Administered 2021-02-20: 150 mg via INTRAVENOUS

## 2021-02-20 MED ORDER — ENOXAPARIN SODIUM 30 MG/0.3ML IJ SOSY
30.0000 mg | PREFILLED_SYRINGE | Freq: Two times a day (BID) | INTRAMUSCULAR | Status: DC
  Administered 2021-02-20 – 2021-02-21 (×2): 30 mg via SUBCUTANEOUS
  Filled 2021-02-20 (×2): qty 0.3

## 2021-02-20 MED ORDER — ACETAMINOPHEN 160 MG/5ML PO SOLN
1000.0000 mg | Freq: Three times a day (TID) | ORAL | Status: DC
  Administered 2021-02-20: 1000 mg via ORAL
  Filled 2021-02-20: qty 40.6

## 2021-02-20 MED ORDER — ENSURE MAX PROTEIN PO LIQD
2.0000 [oz_av] | ORAL | Status: DC
  Administered 2021-02-21 (×3): 2 [oz_av] via ORAL

## 2021-02-20 MED ORDER — HEPARIN SODIUM (PORCINE) 5000 UNIT/ML IJ SOLN
5000.0000 [IU] | INTRAMUSCULAR | Status: AC
  Administered 2021-02-20: 5000 [IU] via SUBCUTANEOUS
  Filled 2021-02-20: qty 1

## 2021-02-20 MED ORDER — ROCURONIUM BROMIDE 10 MG/ML (PF) SYRINGE
PREFILLED_SYRINGE | INTRAVENOUS | Status: DC | PRN
Start: 2021-02-20 — End: 2021-02-20
  Administered 2021-02-20: 100 mg via INTRAVENOUS
  Administered 2021-02-20: 20 mg via INTRAVENOUS

## 2021-02-20 MED ORDER — FENTANYL CITRATE (PF) 250 MCG/5ML IJ SOLN
INTRAMUSCULAR | Status: DC | PRN
  Administered 2021-02-20: 100 ug via INTRAVENOUS

## 2021-02-20 MED ORDER — MIDAZOLAM HCL 2 MG/2ML IJ SOLN
INTRAMUSCULAR | Status: AC
  Filled 2021-02-20: qty 2

## 2021-02-20 MED ORDER — LIDOCAINE 2% (20 MG/ML) 5 ML SYRINGE
INTRAMUSCULAR | Status: DC | PRN
  Administered 2021-02-20: 60 mg via INTRAVENOUS

## 2021-02-20 MED ORDER — ONDANSETRON HCL 4 MG/2ML IJ SOLN
INTRAMUSCULAR | Status: AC
  Filled 2021-02-20: qty 2

## 2021-02-20 MED ORDER — GABAPENTIN 100 MG PO CAPS
200.0000 mg | ORAL_CAPSULE | Freq: Two times a day (BID) | ORAL | Status: DC
  Administered 2021-02-20 – 2021-02-21 (×2): 200 mg via ORAL
  Filled 2021-02-20 (×2): qty 2

## 2021-02-20 MED ORDER — FENTANYL CITRATE (PF) 100 MCG/2ML IJ SOLN
INTRAMUSCULAR | Status: AC
  Filled 2021-02-20: qty 2

## 2021-02-20 MED ORDER — DEXAMETHASONE SODIUM PHOSPHATE 10 MG/ML IJ SOLN
INTRAMUSCULAR | Status: DC | PRN
  Administered 2021-02-20: 10 mg via INTRAVENOUS

## 2021-02-20 MED ORDER — LACTATED RINGERS IV SOLN: INTRAVENOUS | Status: DC

## 2021-02-20 MED ORDER — CARVEDILOL 25 MG PO TABS
25.0000 mg | ORAL_TABLET | Freq: Two times a day (BID) | ORAL | Status: DC
  Administered 2021-02-20 – 2021-02-21 (×2): 25 mg via ORAL
  Filled 2021-02-20 (×2): qty 1

## 2021-02-20 MED ORDER — SODIUM CHLORIDE 0.9% FLUSH
INTRAVENOUS | Status: DC | PRN
  Administered 2021-02-20: 50 mL

## 2021-02-20 MED ORDER — ROCURONIUM BROMIDE 10 MG/ML (PF) SYRINGE
PREFILLED_SYRINGE | INTRAVENOUS | Status: AC
  Filled 2021-02-20: qty 10

## 2021-02-20 MED ORDER — PROMETHAZINE HCL 25 MG/ML IJ SOLN: 6.2500 mg | INTRAMUSCULAR | Status: DC | PRN

## 2021-02-20 MED ORDER — AMISULPRIDE (ANTIEMETIC) 5 MG/2ML IV SOLN: 10.0000 mg | Freq: Once | INTRAVENOUS | Status: DC | PRN

## 2021-02-20 MED ORDER — KCL IN DEXTROSE-NACL 20-5-0.45 MEQ/L-%-% IV SOLN
INTRAVENOUS | Status: DC
  Filled 2021-02-20 (×3): qty 1000

## 2021-02-20 MED ORDER — SODIUM CHLORIDE 0.9 % IV SOLN
12.5000 mg | Freq: Four times a day (QID) | INTRAVENOUS | Status: DC | PRN
  Filled 2021-02-20: qty 0.5

## 2021-02-20 MED ORDER — LIDOCAINE 2% (20 MG/ML) 5 ML SYRINGE
INTRAMUSCULAR | Status: AC
  Filled 2021-02-20: qty 5

## 2021-02-20 MED ORDER — CHLORHEXIDINE GLUCONATE 4 % EX LIQD: 60.0000 mL | Freq: Once | CUTANEOUS | Status: DC

## 2021-02-20 MED ORDER — MIDAZOLAM HCL 2 MG/2ML IJ SOLN
INTRAMUSCULAR | Status: DC | PRN
Start: 2021-02-20 — End: 2021-02-20
  Administered 2021-02-20: 2 mg via INTRAVENOUS

## 2021-02-20 MED ORDER — ONDANSETRON HCL 4 MG/2ML IJ SOLN: 4.0000 mg | Freq: Four times a day (QID) | INTRAMUSCULAR | Status: DC | PRN

## 2021-02-20 MED ORDER — ONDANSETRON HCL 4 MG/2ML IJ SOLN
INTRAMUSCULAR | Status: DC | PRN
  Administered 2021-02-20: 4 mg via INTRAVENOUS

## 2021-02-20 MED ORDER — DICLOFENAC SODIUM 1 % EX GEL
2.0000 g | Freq: Every day | CUTANEOUS | Status: DC | PRN
  Filled 2021-02-20: qty 100

## 2021-02-20 MED ORDER — DEXAMETHASONE SODIUM PHOSPHATE 4 MG/ML IJ SOLN: 4.0000 mg | INTRAMUSCULAR | Status: DC

## 2021-02-20 MED ORDER — LACTATED RINGERS IR SOLN
Status: DC | PRN
  Administered 2021-02-20: 1000 mL

## 2021-02-20 MED ORDER — SIMETHICONE 80 MG PO CHEW: 80.0000 mg | CHEWABLE_TABLET | Freq: Four times a day (QID) | ORAL | Status: DC | PRN

## 2021-02-20 MED ORDER — LISINOPRIL 20 MG PO TABS
40.0000 mg | ORAL_TABLET | Freq: Every day | ORAL | Status: DC
  Administered 2021-02-21: 40 mg via ORAL
  Filled 2021-02-20: qty 2

## 2021-02-20 MED ORDER — FENTANYL CITRATE (PF) 100 MCG/2ML IJ SOLN
25.0000 ug | INTRAMUSCULAR | Status: DC | PRN
  Administered 2021-02-20: 50 ug via INTRAVENOUS

## 2021-02-20 MED ORDER — INSULIN ASPART 100 UNIT/ML IJ SOLN
0.0000 [IU] | INTRAMUSCULAR | Status: DC
  Administered 2021-02-20 – 2021-02-21 (×3): 3 [IU] via SUBCUTANEOUS

## 2021-02-20 MED ORDER — SCOPOLAMINE 1 MG/3DAYS TD PT72: 1.0000 | MEDICATED_PATCH | TRANSDERMAL | Status: DC

## 2021-02-20 MED ORDER — EPHEDRINE SULFATE-NACL 50-0.9 MG/10ML-% IV SOSY
PREFILLED_SYRINGE | INTRAVENOUS | Status: DC | PRN
  Administered 2021-02-20: 10 mg via INTRAVENOUS

## 2021-02-20 MED ORDER — 0.9 % SODIUM CHLORIDE (POUR BTL) OPTIME
TOPICAL | Status: DC | PRN
  Administered 2021-02-20: 1000 mL

## 2021-02-20 MED ORDER — DEXAMETHASONE SODIUM PHOSPHATE 10 MG/ML IJ SOLN
INTRAMUSCULAR | Status: AC
  Filled 2021-02-20: qty 1

## 2021-02-20 MED ORDER — PANTOPRAZOLE SODIUM 40 MG IV SOLR
40.0000 mg | Freq: Every day | INTRAVENOUS | Status: DC
  Administered 2021-02-20: 40 mg via INTRAVENOUS
  Filled 2021-02-20: qty 40

## 2021-02-20 MED ORDER — GABAPENTIN 300 MG PO CAPS
300.0000 mg | ORAL_CAPSULE | ORAL | Status: AC
  Administered 2021-02-20: 300 mg via ORAL
  Filled 2021-02-20: qty 1

## 2021-02-20 MED ORDER — ORAL CARE MOUTH RINSE: 15.0000 mL | Freq: Once | OROMUCOSAL | Status: AC

## 2021-02-20 MED ORDER — APREPITANT 40 MG PO CAPS
40.0000 mg | ORAL_CAPSULE | ORAL | Status: AC
  Administered 2021-02-20: 40 mg via ORAL
  Filled 2021-02-20: qty 1

## 2021-02-20 MED ORDER — AMLODIPINE BESYLATE 10 MG PO TABS
10.0000 mg | ORAL_TABLET | Freq: Every day | ORAL | Status: DC
  Administered 2021-02-21: 10 mg via ORAL
  Filled 2021-02-20: qty 1

## 2021-02-20 MED ORDER — SPIRONOLACTONE 25 MG PO TABS
50.0000 mg | ORAL_TABLET | Freq: Every day | ORAL | Status: DC
  Administered 2021-02-21: 50 mg via ORAL
  Filled 2021-02-20: qty 2

## 2021-02-20 MED ORDER — SUGAMMADEX SODIUM 200 MG/2ML IV SOLN
INTRAVENOUS | Status: DC | PRN
  Administered 2021-02-20: 180 mg via INTRAVENOUS

## 2021-02-20 MED ORDER — OXYCODONE HCL 5 MG/5ML PO SOLN
5.0000 mg | Freq: Four times a day (QID) | ORAL | Status: DC | PRN
  Administered 2021-02-20 – 2021-02-21 (×3): 5 mg via ORAL
  Filled 2021-02-20 (×3): qty 5

## 2021-02-20 MED ORDER — PROPOFOL 10 MG/ML IV BOLUS
INTRAVENOUS | Status: AC
  Filled 2021-02-20: qty 20

## 2021-02-20 MED ORDER — FENTANYL CITRATE (PF) 100 MCG/2ML IJ SOLN: 25.0000 ug | INTRAMUSCULAR | Status: DC | PRN

## 2021-02-20 SURGICAL SUPPLY — 74 items
APPLIER CLIP 5 13 M/L LIGAMAX5 (MISCELLANEOUS)
APPLIER CLIP ROT 10 11.4 M/L (STAPLE)
BAG COUNTER SPONGE SURGICOUNT (BAG) ×2 IMPLANT
BLADE SURG SZ11 CARB STEEL (BLADE) ×2 IMPLANT
CANNULA REDUC XI 12-8 STAPL (CANNULA) ×1
CANNULA REDUCER 12-8 DVNC XI (CANNULA) ×1 IMPLANT
CHLORAPREP W/TINT 26 (MISCELLANEOUS) ×4 IMPLANT
CLIP APPLIE 5 13 M/L LIGAMAX5 (MISCELLANEOUS) IMPLANT
CLIP APPLIE ROT 10 11.4 M/L (STAPLE) IMPLANT
COVER MAYO STAND STRL (DRAPES) ×2 IMPLANT
COVER SURGICAL LIGHT HANDLE (MISCELLANEOUS) ×2 IMPLANT
COVER TIP SHEARS 8 DVNC (MISCELLANEOUS) IMPLANT
COVER TIP SHEARS 8MM DA VINCI (MISCELLANEOUS)
DECANTER SPIKE VIAL GLASS SM (MISCELLANEOUS) ×2 IMPLANT
DERMABOND ADVANCED (GAUZE/BANDAGES/DRESSINGS) ×1
DERMABOND ADVANCED .7 DNX12 (GAUZE/BANDAGES/DRESSINGS) ×1 IMPLANT
DRAPE 3/4 80X56 (DRAPES) ×2 IMPLANT
DRAPE ARM DVNC X/XI (DISPOSABLE) ×3 IMPLANT
DRAPE COLUMN DVNC XI (DISPOSABLE) IMPLANT
DRAPE DA VINCI XI ARM (DISPOSABLE) ×3
DRAPE DA VINCI XI COLUMN (DISPOSABLE)
DRSG TEGADERM 2-3/8X2-3/4 SM (GAUZE/BANDAGES/DRESSINGS) ×10 IMPLANT
ELECT REM PT RETURN 15FT ADLT (MISCELLANEOUS) ×2 IMPLANT
ENDOLOOP SUT PDS II  0 18 (SUTURE)
ENDOLOOP SUT PDS II 0 18 (SUTURE) IMPLANT
GAUZE 4X4 16PLY ~~LOC~~+RFID DBL (SPONGE) ×2 IMPLANT
GAUZE SPONGE 2X2 8PLY STRL LF (GAUZE/BANDAGES/DRESSINGS) ×1 IMPLANT
GLOVE SRG 8 PF TXTR STRL LF DI (GLOVE) ×1 IMPLANT
GLOVE SURG POLY ORTHO LF SZ7.5 (GLOVE) ×4 IMPLANT
GLOVE SURG UNDER POLY LF SZ8 (GLOVE) ×1
GOWN STRL REUS W/TWL LRG LVL3 (GOWN DISPOSABLE) ×6 IMPLANT
GOWN STRL REUS W/TWL XL LVL3 (GOWN DISPOSABLE) ×4 IMPLANT
GRASPER SUT TROCAR 14GX15 (MISCELLANEOUS) ×2 IMPLANT
KIT BASIN OR (CUSTOM PROCEDURE TRAY) ×2 IMPLANT
MARKER SKIN DUAL TIP RULER LAB (MISCELLANEOUS) ×2 IMPLANT
MAT PREVALON FULL STRYKER (MISCELLANEOUS) ×2 IMPLANT
NEEDLE INSUFFLATION 14GA 120MM (NEEDLE) IMPLANT
NEEDLE SPNL 22GX3.5 QUINCKE BK (NEEDLE) ×2 IMPLANT
PACK CARDIOVASCULAR III (CUSTOM PROCEDURE TRAY) ×2 IMPLANT
RELOAD STAPLER 2.5X60 WHT DVNC (STAPLE) ×4 IMPLANT
RELOAD STAPLER 3.5X60 BLU DVNC (STAPLE) ×2 IMPLANT
RELOAD STAPLER 4.3X60 GRN DVNC (STAPLE) IMPLANT
SCISSORS LAP 5X35 DISP (ENDOMECHANICALS) IMPLANT
SEAL CANN UNIV 5-8 DVNC XI (MISCELLANEOUS) ×2 IMPLANT
SEAL XI 5MM-8MM UNIVERSAL (MISCELLANEOUS) ×2
SEALER VESSEL DA VINCI XI (MISCELLANEOUS) ×1
SEALER VESSEL EXT DVNC XI (MISCELLANEOUS) ×1 IMPLANT
SET IRRIG TUBING LAPAROSCOPIC (IRRIGATION / IRRIGATOR) ×2 IMPLANT
SLEEVE GASTRECTOMY 40FR VISIGI (MISCELLANEOUS) ×2 IMPLANT
SOL ANTI FOG 6CC (MISCELLANEOUS) ×1 IMPLANT
SOLUTION ANTI FOG 6CC (MISCELLANEOUS) ×1
SOLUTION ELECTROLUBE (MISCELLANEOUS) ×2 IMPLANT
SPONGE GAUZE 2X2 STER 10/PKG (GAUZE/BANDAGES/DRESSINGS) ×1
STAPLER 60 DA VINCI SURE FORM (STAPLE) ×1
STAPLER 60 SUREFORM DVNC (STAPLE) ×1 IMPLANT
STAPLER CANNULA SEAL DVNC XI (STAPLE) ×1 IMPLANT
STAPLER CANNULA SEAL XI (STAPLE) ×1
STAPLER RELOAD 2.5X60 WHITE (STAPLE) ×4
STAPLER RELOAD 2.5X60 WHT DVNC (STAPLE) ×4
STAPLER RELOAD 3.5X60 BLU DVNC (STAPLE) ×2
STAPLER RELOAD 3.5X60 BLUE (STAPLE) ×2
STAPLER RELOAD 4.3X60 GREEN (STAPLE)
STAPLER RELOAD 4.3X60 GRN DVNC (STAPLE)
SUT MNCRL AB 4-0 PS2 18 (SUTURE) ×4 IMPLANT
SUT VICRYL 0 TIES 12 18 (SUTURE) ×2 IMPLANT
SUT VLOC 180 2-0 9IN GS21 (SUTURE) IMPLANT
SYR 20ML LL LF (SYRINGE) ×2 IMPLANT
TAPE STRIPS DRAPE STRL (GAUZE/BANDAGES/DRESSINGS) ×2 IMPLANT
TOWEL OR 17X26 10 PK STRL BLUE (TOWEL DISPOSABLE) ×2 IMPLANT
TOWEL OR NON WOVEN STRL DISP B (DISPOSABLE) ×2 IMPLANT
TRAY FOLEY MTR SLVR 16FR STAT (SET/KITS/TRAYS/PACK) IMPLANT
TROCAR ADV FIXATION 5X100MM (TROCAR) IMPLANT
TROCAR BLADELESS OPT 5 100 (ENDOMECHANICALS) ×2 IMPLANT
TUBING INSUFFLATION 10FT LAP (TUBING) ×2 IMPLANT

## 2021-02-20 NOTE — Discharge Instructions (Signed)
GASTRIC BYPASS/SLEEVE  Home Care Instructions   These instructions are to help you care for yourself when you go home.  Call: If you have any problems. Call [phone] and ask for the surgeon on call If you need immediate help, come to the ER at [facility].  Tell the ER staff that you are a new post-op gastric bypass or gastric sleeve patient   Signs and symptoms to report: Severe vomiting or nausea If you cannot keep down clear liquids for longer than 1 day, call your surgeon  Abdominal pain that does not get better after taking your pain medication Fever over 100.4 F with chills Heart beating over 100 beats a minute Shortness of breath at rest Chest pain  Redness, swelling, drainage, or foul odor at incision (surgical) sites  If your incisions open or pull apart Swelling or pain in calf (lower leg) Diarrhea (Loose bowel movements that happen often), frequent watery, uncontrolled bowel movements Constipation, (no bowel movements for 3 days) if this happens: Pick one Milk of Magnesia, 2 tablespoons by mouth, 3 times a day for 2 days if needed Stop taking Milk of Magnesia once you have a bowel movement Call your doctor if constipation continues Or Miralax  (instead of Milk of Magnesia) following the label instructions Stop taking Miralax once you have a bowel movement Call your doctor if constipation continues Anything you think is not normal   Normal side effects after surgery: Unable to sleep at night or unable to focus Irritability or moody Being tearful (crying) or depressed These are common complaints, possibly related to your anesthesia medications that put you to sleep, stress of surgery, and change in lifestyle.  This usually goes away a few weeks after surgery.  If these feelings continue, call your primary care doctor.   Wound Care: You may have surgical glue, steri-strips, or staples over your incisions after surgery Surgical glue:  Looks like a clear film  over your incisions and will wear off a little at a time Steri-strips: Strips of tape over your incisions. You may notice a yellowish color on the skin under the steri-strips. This is used to make the   steri-strips stick better. Do not pull the steri-strips off - let them fall off Staples: Staples may be removed before you leave the hospital If you go home with staples, call [facility], [phone]) [phone] at for an appointment with your surgeon's nurse to have staples removed 10 days after surgery. Showering: You may shower two (2) days after your surgery unless your surgeon tells you differently Wash gently around incisions with warm soapy water, rinse well, and gently pat dry  No tub baths until staples are removed, steri-strips fall off or glue is gone.    Medications: Medications should be liquid or crushed if larger than the size of a dime Extended release pills (medication that release a little bit at a time through the day) should NOT be crushed or cut. (examples include XL, ER, DR, SR) Depending on the size and number of medications you take, you may need to space (take a few throughout the day)/change the time you take your medications so that you do not over-fill your pouch (smaller stomach) Make sure you follow-up with your primary care doctor to make medication changes needed during rapid weight loss and life-style changes If you have diabetes, follow up with the doctor that orders your diabetes medication(s) within one week after surgery and check your blood sugar regularly.  Do not drive while taking prescription pain medication  It is ok to take Tylenol by the bottle instructions with your pain medicine or instead of your pain medicine as needed.  DO NOT TAKE NSAIDS (EXAMPLES OF NSAIDS:  IBUPROFREN/ NAPROXEN)  Diet:                    First 2 Weeks  You will see the dietician t about two (2) weeks after your surgery. The dietician will increase the types of foods you can eat  if you are handling liquids well: If you have severe vomiting or nausea and cannot keep down clear liquids lasting longer than 1 day, call your surgeon @ [phone]) Protein Shake Drink at least 2 ounces of shake 5-6 times per day Each serving of protein shakes (usually 8 - 12 ounces) should have: 15 grams of protein  And no more than 5 grams of carbohydrate  Goal for protein each day: Men = 80 grams per day Women = 60 grams per day Protein powder may be added to fluids such as non-fat milk or Lactaid milk or unsweetened Soy/Almond milk (limit to 35 grams added protein powder per serving)  Hydration Slowly increase the amount of water and other clear liquids as tolerated (See Acceptable Fluids) Slowly increase the amount of protein shake as tolerated   Sip fluids slowly and throughout the day.  Do not use straws. May use sugar substitutes in small amounts (no more than 6 - 8 packets per day; i.e. Splenda)  Fluid Goal The first goal is to drink at least 8 ounces of protein shake/drink per day (or as directed by the nutritionist); some examples of protein shakes are [employer], [employer], EAS Edge HP, and Unjury. See handout from pre-op Bariatric Education Class: Slowly increase the amount of protein shake you drink as tolerated You may find it easier to slowly sip shakes throughout the day It is important to get your proteins in first Your fluid goal is to drink 64 - 100 ounces of fluid daily It may take a few weeks to build up to this 32 oz (or more) should be clear liquids  And  32 oz (or more) should be full liquids (see below for examples) Liquids should not contain sugar, caffeine, or carbonation  Clear Liquids: Water or Sugar-free flavored water (i.e. Fruit H2O, Propel) Decaffeinated coffee or tea (sugar-free) Crystal Lite, Wyler's Lite, Minute Maid Lite Sugar-free Jell-O Bouillon or broth Sugar-free Popsicle:   *Less than 20 calories each; Limit 1 per  day  Full Liquids: Protein Shakes/Drinks + 2 choices per day of other full liquids Full liquids must be: No More Than 15 grams of Carbs per serving  No More Than 3 grams of Fat per serving Strained low-fat cream soup (except Cream of Potato or Tomato) Non-Fat milk Fat-free Lactaid Milk Unsweetened Soy Or Unsweetened Almond Milk Low Sugar yogurt (Dannon Lite & Fit, Greek yogurt; Oikos Triple Zero; Chobani Simply 100; Yoplait 100 calorie [place] - No Fruit on the Bottom)    Vitamins and Minerals Start 1 day after surgery unless otherwise directed by your surgeon 2 Chewable Bariatric Specific Multivitamin / Multimineral Supplement with iron (Example: Bariatric Advantage Multi EA) Chewable Calcium with Vitamin D-3 (Example: 3 Chewable Calcium Plus 600 with Vitamin D-3) Take 500 mg three (3) times a day for a total of 1500 mg each day Do not take all 3 doses of calcium at one time as it may cause constipation, and  you can only absorb 500 mg  at a time  Do not mix multivitamins containing iron with calcium supplements; take 2 hours apart Menstruating women and those with a history of anemia (a blood disease that causes weakness) may need extra iron Talk with your doctor to see if you need more iron Do not stop taking or change any vitamins or minerals until you talk to your dietitian or surgeon Your Dietitian and/or surgeon must approve all vitamin and mineral supplements   Activity and Exercise: Limit your physical activity as instructed by your doctor.  It is important to continue walking at home.  During this time, use these guidelines: Do not lift anything greater than ten (10) pounds for at least two (2) weeks Do not go back to work or drive until Engineer, production says you can You may have sex when you feel comfortable  It is VERY important for female patients to use a reliable birth control method; fertility often increases after surgery  All hormonal birth control will be ineffective for 30  days after surgery due to medications given during surgery a barrier method must be used. Do not get pregnant for at least 18 months Start exercising as soon as your doctor tells you that you can Make sure your doctor approves any physical activity Start with a simple walking program Walk 5-15 minutes each day, 7 days per week.  Slowly increase until you are walking 30-45 minutes per day Consider joining our [place] program. [phone] or email [email or web address]   Special Instructions Things to remember: Use your CPAP when sleeping if this applies to you  [facility] has two free Bariatric Surgery Support Groups that meet monthly The 3rd Thursday of each month, 6 pm, [place]  The 2nd Friday of each month, 11:45 am in the private dining room in the basement of [place] It is very important to keep all follow up appointments with your surgeon, dietitian, primary care physician, and behavioral health practitioner Routine follow up schedule with your surgeon include appointments at 2-3 weeks, 6-8 weeks, 6 months, and 1 year at a minimum.  Your surgeon may request to see you more often.   After the first year, please follow up with your bariatric surgeon and dietitian at least once a year in order to maintain best weight loss results [facility]: [place]: [phone] Bariatric Nurse Coordinator: [phone]      Reviewed and Endorsed  by [facility] Patient Education Committee, June, 2016 Edits Approved: Aug, 2018

## 2021-02-20 NOTE — Op Note (Signed)
02/20/2021 Shelly Cohen 05/26/1961 [id number]   PRE-OPERATIVE DIAGNOSIS:   HTN (hypertension), benign   Hypothyroidism, unspecified type   Primary open angle glaucoma of left eye, unspecified glaucoma stage   OSA (obstructive sleep apnea)   Prediabetes   Thalassemia trait, alpha   Primary osteoarthritis of right hip   Class 2 severe obesity with serious comorbidity and body mass index (BMI) of 39.0 to 39.9 in adult, unspecified obesity type (CMS-HCC)   POST-OPERATIVE DIAGNOSIS:  same  PROCEDURE:  Procedure(s): Xi robotic SLEEVE GASTRECTOMY UPPER GI ENDOSCOPY  SURGEON:  Surgeon(s): Gayland Curry, MD FACS FASMBS  ASSISTANTS: Johnathan Hausen MD FACS  ANESTHESIA:   general  DRAINS: none   BOUGIE: 40 fr ViSiGi  LOCAL MEDICATIONS USED:   Exparel  EBL: minimal SPECIMEN:  Source of Specimen:  Greater curvature of stomach  DISPOSITION OF SPECIMEN:  PATHOLOGY  COUNTS:  YES  INDICATION FOR PROCEDURE: This is a very pleasant 60 y.o.-year-old severely obese female who has had unsuccessful attempts for sustained weight loss. The patient presents today for a planned laparoscopic/robotic sleeve gastrectomy with upper endoscopy. We have discussed the risk and benefits of the procedure extensively preoperatively. Please see my separate notes.  PROCEDURE: After obtaining informed consent and receiving 5000 units of subcutaneous heparin, the patient was brought to the operating room at [facility] and placed supine on the operating room table. General endotracheal anesthesia was established. Sequential compression devices were placed.  The patient's abdomen was prepped and draped in the usual standard surgical fashion. The patient received preoperative IV antibiotic. A surgical timeout was performed. ERAS protocol used.   Access to the abdomen was achieved using a 5 mm 0 laparoscope thru a 5 mm trocar In the mid clavicular line in the left midabdomen about 3 cm to the  left & slight below the level of the umbilicus using the Optiview technique. Pneumoperitoneum was smoothly established up to 15 mm of mercury. The laparoscope was advanced and the abdominal cavity was surveilled. The patient was then placed in reverse Trendelenburg.  A tap block was performed on patient's right side under direct visualization.  A 12 mm robotic trocar was placed in the mid right abdomen.  The patient was rotated to the right.  The optical entry trocar in the left midabdomen was changed to a 8 mm robotic trocar under direct visualization. An 51m robotic trocar was placed in the lateral left abdominal wall also under direct visualization.  The [facility] retractor was placed under the left lobe of the liver through a 5 mm trocar incision site in the subxiphoid position.   A tap block was performed along the patient's left side also under direct laparoscopic visualization.  The tap blocks were performed by postoperative pain relief.   The XI robot was then docked and targeted for upper abdominal anatomy.  Arm 2  was attached to the left paraumbilical abdominal trocar and the camera was inserted.  Anatomy was targeted.  Arms 1 and ,3,4 were then attached to the robotic trochars.  A fenestrated bipolar was placed in arm 1.  A vessel sealer was placed in arm 3, tip up grasper in arm 4.  My assistant stayed at the bedside while I went to the robotic console.  The stomach was inspected. It was completely decompressed and the orogastric tube was removed.  There was no anterior dimple that was obviously visible.     We identified the pylorus and measured 6 cm proximal to the pylorus  and identified an area of where we would start taking down the short gastric vessels.  The vessel sealer was used to take down the short gastric vessels along the greater curvature of the stomach. We were able to enter the lesser sac. We continued to march along the greater curvature of the stomach taking down the  short gastrics.    as we approached the gastrosplenic ligament we took care in this area not to injure the spleen. We were able to take down the entire gastrosplenic ligament. We then mobilized the fundus away from the left crus of diaphragm. There were not any significant posterior gastric avascular attachments. This left the stomach completely mobilized. No vessels had been taken down along the lesser curvature of the stomach. There was no posterior plug of fat at the hiatus.    We then reidentified the pylorus. A 40Fr ViSiGi was then placed in the oropharynx and advanced down into the stomach and placed in the distal antrum and positioned along the lesser curvature. It was placed under suction which secured the 40Fr ViSiGi in place along the lesser curve. Then using the robotic 60 mm stapler with a blue load, I placed a stapler along the antrum approximately 6cm from the pylorus. The stapler was angled so that there is ample room at the angularis incisura. I then fired the first staple load after inspecting it posteriorly to ensure adequate space both anteriorly and posteriorly.  I uses a 2nd 60 mm blue load staple cartridge. The stapler did not have to pause for compression so at this point I started using white loads. The robotic stapler was then repositioned with a 60 mm white load  and we continued to march up along the [place]. My assistant was exchanging the stapler after firing. The tip up grasper was holding traction along the greater curvature stomach along the cauterized short gastric vessels ensuring that the stomach was symmetrically retracted. Prior to each firing of the staple, we rotated the stomach to ensure that there is adequate stomach left.  As we approached the fundus, I continued using 60 mm white cartridge aiming  lateral to the GE junction after mobilizing some of the esophageal fat pad.  The sleeve was inspected. There is no evidence of cork screw. The staple line appeared hemostatic   The CRNA inflated the ViSiGi to the green zone and the upper abdomen was flooded with saline. There were no bubbles. The sleeve was decompressed and the ViSiGi removed.I performed an upper endoscopy.  The endoscope was placed in the patient's oropharynx and gently glided down.  Z-line at approximately 36 cm.  The endoscope was advanced into the gastric sleeve and insufflated.  I was able to easily advance the endoscope down into the antrum.  Pylorus was visible.  There is no significant stricture at the incisura.  There was no corkscrewing.  The external staple line was re-inspected and there was no bleeding. There is no evidence of internal bleeding.  The sleeve was decompressed and the endoscope was removed.   The greater curvature the stomach was grasped with a laparoscopic grasper.  At this point I scrubbed back in.  The robot was undocked.  We removed the specimen from the 12 mm trocar site.  The liver retractor was removed. I then closed the 12 mm trocar site with 1 interrupted 0 Vicryl sutures through the fascia using the endoclose. The closure was viewed laparoscopically and it was airtight. Remaining Exparel was then infiltrated in the preperitoneal  spaces around the trocar sites. Pneumoperitoneum was released. All trocar sites were closed with a 4-0 Monocryl in a subcuticular fashion followed by the application of steri-strips, gauze, & tegaderms. The patient was extubated and taken to the recovery room in stable condition. All needle, instrument, and sponge counts were correct x2. There are no immediate complications  (2) 60 mm blue (4) 60 mm white  PLAN OF CARE: Admit to inpatient   PATIENT DISPOSITION:  PACU - hemodynamically stable.   Delay start of Pharmacological VTE agent (>24hrs) due to surgical blood loss or risk of bleeding:  no   Leighton Ruff. Redmond Pulling, MD, FACS FASMBS General, Bariatric, & Minimally Invasive Surgery [facility], [place]

## 2021-02-20 NOTE — Progress Notes (Signed)
PHARMACY CONSULT FOR:  Risk Assessment for Post-Discharge VTE Following Bariatric Surgery  Post-Discharge VTE Risk Assessment: This patient's probability of 30-day post-discharge VTE is increased due to the factors marked:   Female    Age >/=60 years    BMI >/=50 kg/m2    CHF    Dyspnea at Rest    Paraplegia   X Non-gastric-band surgery    Operation Time >/=3 hr    Return to OR     Length of Stay >/= 3 d   Hx of VTE   Hypercoagulable condition   Significant venous stasis     Predicted probability of 30-day post-discharge VTE: 0.16%   Recommendation for Discharge: No pharmacologic prophylaxis post-discharge    Shelly Cohen is a 59 y.o. female who underwent  gastric sleeve resection  on 02/20/21.   Case start: 1151 Case end: 1311   Allergies  Allergen Reactions   Dilaudid [Hydromorphone Hcl] Itching   Diphenhydramine Hcl Itching    hallucination    Hydrocodone Nausea Only   Oxycodone Nausea And Vomiting   Azithromycin Itching and Rash   Cephalexin Rash   Penicillin G Rash    Did it involve swelling of the face/tongue/throat, SOB, or low BP? No Did it involve sudden or severe rash/hives, skin peeling, or any reaction on the inside of your mouth or nose? No Did you need to seek medical attention at a hospital or doctor's office? No When did it last happen?      childhood If all above answers are "NO", may proceed with cephalosporin use.    Zolpidem Itching and Rash    Patient Measurements: Height: '5\' 2"'$  (157.5 cm) Weight: 90.7 kg (200 lb) IBW/kg (Calculated) : 50.1 Body mass index is 36.58 kg/m.  Recent Labs    02/20/21 0912 02/20/21 1352  WBC 7.7  --   HGB 12.3 10.4*  HCT 39.1 33.5*  PLT 269  --    Estimated Creatinine Clearance: 68.9 mL/min (by C-G formula based on SCr of 0.92 mg/dL).    Past Medical History:  Diagnosis Date   Alpha thalassemia trait    Arthritis    hips   Fibroadenoma    Glaucoma    monitored every 26month, right eye  worse than left    History of positive PPD, treatment status unknown    Hypertension    OSA (obstructive sleep apnea)    mild with AHI 8/hr   Pre-diabetes    Thyroid disease      Medications Prior to Admission  Medication Sig Dispense Refill Last Dose   amLODipine (NORVASC) 10 MG tablet TAKE 1 TABLET BY MOUTH DAILY 90 tablet 3 02/20/2021 at 0700   aspirin EC 81 MG tablet Take 81 mg by mouth daily. Swallow whole.   02/10/2021   BIOTIN PO Take 1 tablet by mouth daily.   Past Month   CALCIUM-VITAMIN D PO Take 1 tablet by mouth daily.   02/19/2021   carvedilol (COREG) 25 MG tablet Take 1 tablet by mouth 2 (two) times daily. 180 tablet 2 02/20/2021 at 0700   diclofenac Sodium (VOLTAREN) 1 % GEL Apply 2 g topically daily as needed (hip pain).   Past Month   docusate sodium (COLACE) 100 MG capsule Take 100 mg by mouth every other day.   Past Week   ferrous sulfate 325 (65 FE) MG tablet Take 325 mg by mouth daily with breakfast.   Past Week   KRILL OIL PO Take 1 capsule by mouth daily.  Past Month   latanoprost (XALATAN) 0.005 % ophthalmic solution Place 1 drop into both eyes every evening (Patient taking differently: Place 1 drop into both eyes at bedtime.) 5 mL 6 02/18/2021   levothyroxine (SYNTHROID) 50 MCG tablet TAKE 1 TABLET BY MOUTH DAILY BEFORE BREAKFAST 90 tablet 1 02/20/2021 at 0600   lisinopril (ZESTRIL) 40 MG tablet TAKE 1 TABLET BY MOUTH ONCE A DAY 90 tablet 3 02/19/2021 at 0600   MELATONIN PO Take 1 tablet by mouth at bedtime.   02/18/2021   Multiple Vitamin (MULTI-VITAMINS) TABS Take 1 tablet by mouth daily.    02/19/2021   spironolactone (ALDACTONE) 50 MG tablet TAKE 1 TABLET BY MOUTH ONCE A DAY 90 tablet 3 02/19/2021   diclofenac (VOLTAREN) 75 MG EC tablet TAKE 1 TABLET BY MOUTH 2 TIMES DAILY BETWEEN MEALS AS NEEDED (Patient not taking: No sig reported) 60 tablet 1 Not Taking   fluticasone (FLONASE) 50 MCG/ACT nasal spray Place 1 spray into both nostrils daily as needed for allergies or  rhinitis.   More than a month   mupirocin ointment (BACTROBAN) 2 % APPLY 2 TIMES DAILY FOR 4 DAYS (Patient not taking: No sig reported) 22 g 0 Not Taking       Sarra Rachels P 02/20/2021,3:49 PM

## 2021-02-20 NOTE — Progress Notes (Signed)
Discussed post op day goals with patient including ambulation, IS, diet progression, pain, and nausea control.  IS demonstrated to 1250, provided Goals for Discharge sheet.  Questions answered.

## 2021-02-20 NOTE — Anesthesia Procedure Notes (Signed)
Procedure Name: Intubation Date/Time: 02/20/2021 11:32 AM Performed by: Eben Burow, CRNA Pre-anesthesia Checklist: Patient identified, Emergency Drugs available, Suction available and Patient being monitored Patient Re-evaluated:Patient Re-evaluated prior to induction Oxygen Delivery Method: Circle system utilized Preoxygenation: Pre-oxygenation with 100% oxygen Induction Type: IV induction Ventilation: Mask ventilation without difficulty Laryngoscope Size: Mac and 4 Grade View: Grade I Tube type: Oral Number of attempts: 1 Airway Equipment and Method: Stylet Placement Confirmation: ETT inserted through vocal cords under direct vision, positive ETCO2, breath sounds checked- equal and bilateral and CO2 detector Secured at: 21 cm Tube secured with: Tape Dental Injury: Teeth and Oropharynx as per pre-operative assessment  Comments: Performed by Loura Pardon

## 2021-02-20 NOTE — Progress Notes (Signed)
Rt went to inquire about cpap and pt states she doesn't wear it at home and she doesn't want to start here. She refused cpap.

## 2021-02-20 NOTE — Anesthesia Postprocedure Evaluation (Signed)
Anesthesia Post Note  Patient: Shelly Cohen  Procedure(s) Performed: XI ROBOT ASSISTED GASTRIC SLEEVE RESECTION (Abdomen) UPPER GI ENDOSCOPY (Abdomen)     Patient location during evaluation: PACU Anesthesia Type: General Level of consciousness: sedated Pain management: pain level controlled Vital Signs Assessment: post-procedure vital signs reviewed and stable Respiratory status: spontaneous breathing and respiratory function stable Cardiovascular status: stable Postop Assessment: no apparent nausea or vomiting Anesthetic complications: no   No notable events documented.  Last Vitals:  Vitals:   02/20/21 1445 02/20/21 1500  BP: 119/68 120/70  Pulse: 68 70  Resp: 13 15  Temp: 36.7 C   SpO2: 97% 99%    Last Pain:  Vitals:   02/20/21 1500  TempSrc:   PainSc: Asleep                 Jacoria Keiffer DANIEL

## 2021-02-20 NOTE — Transfer of Care (Signed)
Immediate Anesthesia Transfer of Care Note  Patient: Shelly Cohen  Procedure(s) Performed: XI ROBOT ASSISTED GASTRIC SLEEVE RESECTION (Abdomen) UPPER GI ENDOSCOPY (Abdomen)  Patient Location: PACU  Anesthesia Type:General  Level of Consciousness: awake, alert  and patient cooperative  Airway & Oxygen Therapy: Patient Spontanous Breathing and Patient connected to face mask oxygen  Post-op Assessment: Report given to RN and Post -op Vital signs reviewed and stable  Post vital signs: Reviewed and stable  Last Vitals:  Vitals Value Taken Time  BP 125/65 02/20/21 1320  Temp    Pulse 79 02/20/21 1322  Resp 26 02/20/21 1322  SpO2 100 % 02/20/21 1322  Vitals shown include unvalidated device data.  Last Pain:  Vitals:   02/20/21 0928  TempSrc:   PainSc: 0-No pain         Complications: No notable events documented.

## 2021-02-20 NOTE — Interval H&P Note (Signed)
History and Physical Interval Note:  02/20/2021 11:05 AM  Shelly Cohen  has presented today for surgery, with the diagnosis of MORBID OBESITY.  The various methods of treatment have been discussed with the patient and family. After consideration of risks, benefits and other options for treatment, the patient has consented to  Procedure(s): XI ROBOT ASSISTED GASTRIC SLEEVE RESECTION (N/A) UPPER GI ENDOSCOPY (N/A) as a surgical intervention.  The patient's history has been reviewed, patient examined, no change in status, stable for surgery.  I have reviewed the patient's chart and labs.  Questions were answered to the patient's satisfaction.   Discussed the use of the surgical robot  Greer Pickerel

## 2021-02-21 ENCOUNTER — Encounter (HOSPITAL_COMMUNITY): Payer: Self-pay | Admitting: General Surgery

## 2021-02-21 ENCOUNTER — Other Ambulatory Visit (HOSPITAL_COMMUNITY): Payer: Self-pay

## 2021-02-21 LAB — COMPREHENSIVE METABOLIC PANEL
ALT: 17 U/L (ref 0–44)
AST: 19 U/L (ref 15–41)
Albumin: 3.7 g/dL (ref 3.5–5.0)
Alkaline Phosphatase: 50 U/L (ref 38–126)
Anion gap: 9 (ref 5–15)
BUN: 27 mg/dL — ABNORMAL HIGH (ref 6–20)
CO2: 22 mmol/L (ref 22–32)
Calcium: 9.7 mg/dL (ref 8.9–10.3)
Chloride: 106 mmol/L (ref 98–111)
Creatinine, Ser: 1.12 mg/dL — ABNORMAL HIGH (ref 0.44–1.00)
GFR, Estimated: 57 mL/min — ABNORMAL LOW (ref >60)
Glucose, Bld: 178 mg/dL — ABNORMAL HIGH (ref 70–99)
Potassium: 4.7 mmol/L (ref 3.5–5.1)
Sodium: 137 mmol/L (ref 135–145)
Total Bilirubin: 0.5 mg/dL (ref 0.3–1.2)
Total Protein: 7.8 g/dL (ref 6.5–8.1)

## 2021-02-21 LAB — CBC WITH DIFFERENTIAL/PLATELET
Abs Immature Granulocytes: 0.07 10*3/uL (ref 0.00–0.07)
Basophils Absolute: 0 10*3/uL (ref 0.0–0.1)
Basophils Relative: 0 %
Eosinophils Absolute: 0 10*3/uL (ref 0.0–0.5)
Eosinophils Relative: 0 %
HCT: 35.8 % — ABNORMAL LOW (ref 36.0–46.0)
Hemoglobin: 11.2 g/dL — ABNORMAL LOW (ref 12.0–15.0)
Immature Granulocytes: 1 %
Lymphocytes Relative: 8 %
Lymphs Abs: 0.9 10*3/uL (ref 0.7–4.0)
MCH: 25.2 pg — ABNORMAL LOW (ref 26.0–34.0)
MCHC: 31.3 g/dL (ref 30.0–36.0)
MCV: 80.6 fL (ref 80.0–100.0)
Monocytes Absolute: 0.2 10*3/uL (ref 0.1–1.0)
Monocytes Relative: 2 %
Neutro Abs: 10.9 10*3/uL — ABNORMAL HIGH (ref 1.7–7.7)
Neutrophils Relative %: 89 %
Platelets: 221 10*3/uL (ref 150–400)
RBC: 4.44 MIL/uL (ref 3.87–5.11)
RDW: 16.2 % — ABNORMAL HIGH (ref 11.5–15.5)
WBC: 12 10*3/uL — ABNORMAL HIGH (ref 4.0–10.5)
nRBC: 0 % (ref 0.0–0.2)

## 2021-02-21 LAB — GLUCOSE, CAPILLARY
Glucose-Capillary: 107 mg/dL — ABNORMAL HIGH (ref 70–99)
Glucose-Capillary: 189 mg/dL — ABNORMAL HIGH (ref 70–99)
Glucose-Capillary: 193 mg/dL — ABNORMAL HIGH (ref 70–99)
Glucose-Capillary: 95 mg/dL (ref 70–99)

## 2021-02-21 LAB — SURGICAL PATHOLOGY

## 2021-02-21 MED ORDER — GABAPENTIN 100 MG PO CAPS
200.0000 mg | ORAL_CAPSULE | Freq: Two times a day (BID) | ORAL | 0 refills | Status: DC
  Filled 2021-02-21: qty 20, 5d supply, fill #0

## 2021-02-21 MED ORDER — ACETAMINOPHEN 500 MG PO TABS: 1000.0000 mg | ORAL_TABLET | Freq: Three times a day (TID) | ORAL | 0 refills | Status: AC

## 2021-02-21 MED ORDER — ONDANSETRON 4 MG PO TBDP
4.0000 mg | ORAL_TABLET | Freq: Four times a day (QID) | ORAL | 0 refills | Status: DC | PRN
  Filled 2021-02-21: qty 20, 5d supply, fill #0

## 2021-02-21 MED ORDER — OXYCODONE HCL 5 MG PO TABS
5.0000 mg | ORAL_TABLET | Freq: Four times a day (QID) | ORAL | 0 refills | Status: DC | PRN
  Filled 2021-02-21: qty 10, 3d supply, fill #0

## 2021-02-21 MED ORDER — PANTOPRAZOLE SODIUM 40 MG PO TBEC
40.0000 mg | DELAYED_RELEASE_TABLET | Freq: Every day | ORAL | 0 refills | Status: DC
  Filled 2021-02-21: qty 90, 90d supply, fill #0

## 2021-02-21 NOTE — Discharge Summary (Signed)
Physician Discharge Summary  Shelly Cohen NGE:[id number] DOB: Nov 13, 1960 DOA: 02/20/2021  PCP: Flossie Buffy, NP  Admit date: 02/20/2021 Discharge date: 02/21/2021  Recommendations for Outpatient Follow-up:    Follow-up Information     Greer Pickerel, MD. Go on 03/10/2021.   Specialty: General Surgery Why: Appointment time: 300pm.  Please arrive 15 minutes prior to appointment.  Thank you. Contact information: [address] [place] [address] [phone]         Carlena Hurl, PA-C. Go on 04/20/2021.   Specialty: General Surgery Why: Appointment time: 930am for Dr. Redmond Pulling.  Please arrive 15 minutes prior to appointment.  Thank you. Contact information: [place] [address] [phone]                Discharge Diagnoses:  Active Problems:   S/P robotic sleeve gastrectomy HTN (hypertension), benign   Hypothyroidism, unspecified type   Primary open angle glaucoma of left eye, unspecified glaucoma stage   OSA (obstructive sleep apnea)   Prediabetes   Thalassemia trait, alpha   Primary osteoarthritis of right hip   Class 2 severe obesity with serious comorbidity and body mass index (BMI) of 39.0 to 39.9 in adult, unspecified obesity type (CMS-HCC)  Surgical Procedure: robotic Sleeve Gastrectomy, upper endoscopy  Discharge Condition: Good Disposition: Home  Diet recommendation: Postoperative sleeve gastrectomy diet (liquids only)  Filed Weights   02/20/21 0925  Weight: 90.7 kg     Hospital Course:  The patient was admitted for a planned robotic sleeve gastrectomy. Please see operative note. Preoperatively the patient was given 5000 units of subcutaneous heparin for DVT prophylaxis. Postoperative prophylactic Lovenox dosing was started on the evening of postoperative day 0. ERAS protocol was used. On the evening of postoperative day 0, the patient was started on water and ice chips. On postoperative day 1 the  patient had no fever or tachycardia and was tolerating water in their diet was gradually advanced throughout the day. The patient was ambulating without difficulty. Their vital signs are stable without fever or tachycardia. Their hemoglobin had remained stable. . The patient had received discharge instructions and counseling. They were deemed stable for discharge and had met discharge criteria  BP 125/78 (BP Location: Right Arm)   Pulse 77   Temp (!) 97.4 F (36.3 C) (Oral)   Resp 17   Ht _0  (1.575 m)   Wt 90.7 kg   SpO2 99%   BMI 36.58 kg/m   Gen: alert, NAD, non-toxic appearing Pupils: equal, no scleral icterus Pulm: Lungs clear to auscultation, symmetric chest rise CV: regular rate and rhythm Abd: soft, mild approp tender, nondistended. . No cellulitis. No incisional hernia Ext: no edema, no calf tenderness Skin: no rash, no jaundice   Discharge Instructions  Discharge Instructions     Ambulate hourly while awake   Complete by: As directed    Call MD for:  difficulty breathing, headache or visual disturbances   Complete by: As directed    Call MD for:  persistant dizziness or light-headedness   Complete by: As directed    Call MD for:  persistant nausea and vomiting   Complete by: As directed    Call MD for:  redness, tenderness, or signs of infection (pain, swelling, redness, odor or green/yellow discharge around incision site)   Complete by: As directed    Call MD for:  severe uncontrolled pain   Complete by: As directed    Call MD for:  temperature >101 F   Complete by: As directed    Diet bariatric full liquid   Complete by: As directed    Discharge instructions   Complete by: As directed    See bariatric discharge instructions   Incentive spirometry   Complete by: As directed    Perform hourly while awake      Allergies as of 02/21/2021       Reactions   Dilaudid [hydromorphone Hcl] Itching   Diphenhydramine Hcl Itching   hallucination    Hydrocodone  Nausea Only   Oxycodone Nausea And Vomiting   Azithromycin Itching, Rash   Cephalexin Rash   Penicillin G Rash   Did it involve swelling of the face/tongue/throat, SOB, or low BP? No Did it involve sudden or severe rash/hives, skin peeling, or any reaction on the inside of your mouth or nose? No Did you need to seek medical attention at a hospital or doctor's office? No When did it last happen?      childhood If all above answers are "NO", may proceed with cephalosporin use.   Zolpidem Itching, Rash        Medication List     STOP taking these medications    aspirin EC 81 MG tablet   diclofenac 75 MG EC tablet Commonly known as: VOLTAREN   mupirocin ointment 2 % Commonly known as: BACTROBAN       TAKE these medications    acetaminophen 500 MG tablet Commonly known as: TYLENOL Take 2 tablets (1,000 mg total) by mouth every 8 (eight) hours for 5 days.   amLODipine 10 MG tablet Commonly known as: NORVASC TAKE 1 TABLET BY MOUTH DAILY Notes to patient: Monitor Blood Pressure Daily and keep a log for primary care physician.  You may need to make changes to your medications with rapid weight loss.     BIOTIN PO Take 1 tablet by mouth daily.   CALCIUM-VITAMIN D PO Take 1 tablet by mouth daily.   carvedilol 25 MG tablet Commonly known as: COREG Take 1 tablet by mouth 2 (two) times daily. Notes to patient: Monitor Blood Pressure Daily and keep a log for primary care physician.  You may need to make changes to your medications with rapid weight loss.     diclofenac Sodium 1 % Gel Commonly known as: VOLTAREN Apply 2 g topically daily as needed (hip pain). Notes to patient: Avoid NSAIDs for 6-8 weeks after surgery   docusate sodium 100 MG capsule Commonly known as: COLACE Take 100 mg by mouth every other day.   ferrous sulfate 325 (65 FE) MG tablet Take 325 mg by mouth daily with breakfast.   fluticasone 50 MCG/ACT nasal spray Commonly known as: FLONASE Place 1  spray into both nostrils daily as needed for allergies or rhinitis.   gabapentin 100 MG capsule Commonly known as: NEURONTIN Take 2 capsules (200 mg total) by mouth every 12 (twelve) hours.   KRILL OIL PO Take 1 capsule by mouth daily.   latanoprost 0.005 % ophthalmic solution Commonly known as: XALATAN Place 1 drop into both eyes every evening What changed:  how much to take how to take this when to take this   levothyroxine 50 MCG tablet Commonly known as: SYNTHROID TAKE 1 TABLET BY MOUTH DAILY BEFORE BREAKFAST   lisinopril 40 MG tablet Commonly known as: ZESTRIL TAKE 1 TABLET BY MOUTH ONCE A DAY Notes to patient: Monitor Blood Pressure Daily and keep a log for primary care physician.  You  may need to make changes to your medications with rapid weight loss.     MELATONIN PO Take 1 tablet by mouth at bedtime.   Multi-Vitamins Tabs Take 1 tablet by mouth daily.   ondansetron 4 MG disintegrating tablet Commonly known as: ZOFRAN-ODT Take 1 tablet (4 mg total) by mouth every 6 (six) hours as needed for nausea or vomiting.   oxyCODONE 5 MG immediate release tablet Commonly known as: Oxy IR/ROXICODONE Take 1 tablet (5 mg total) by mouth every 6 (six) hours as needed for severe pain.   pantoprazole 40 MG tablet Commonly known as: PROTONIX Take 1 tablet (40 mg total) by mouth daily.   spironolactone 50 MG tablet Commonly known as: ALDACTONE TAKE 1 TABLET BY MOUTH ONCE A DAY Notes to patient: Monitor Blood Pressure Daily and keep a log for primary care physician.  Monitor for symptoms of dehydration.  You may need to make changes to your medications with rapid weight loss.          Follow-up Information     Greer Pickerel, MD. Go on 03/10/2021.   Specialty: General Surgery Why: Appointment time: 300pm.  Please arrive 15 minutes prior to appointment.  Thank you. Contact information: [address] [place] [address] [phone]         Carlena Hurl,  PA-C. Go on 04/20/2021.   Specialty: General Surgery Why: Appointment time: 930am for Dr. Redmond Pulling.  Please arrive 15 minutes prior to appointment.  Thank you. Contact information: [address] [place] [address] [phone]                  The results of significant diagnostics from this hospitalization (including imaging, microbiology, ancillary and laboratory) are listed below for reference.    Significant Diagnostic Studies: No results found.  Labs: Basic Metabolic Panel: Recent Labs  Lab 02/21/21 0421  NA 137  K 4.7  CL 106  CO2 22  GLUCOSE 178*  BUN 27*  CREATININE 1.12*  CALCIUM 9.7   Liver Function Tests: Recent Labs  Lab 02/21/21 0421  AST 19  ALT 17  ALKPHOS 50  BILITOT 0.5  PROT 7.8  ALBUMIN 3.7    CBC: Recent Labs  Lab 02/20/21 0912 02/20/21 1352 02/21/21 0421  WBC 7.7  --  12.0*  NEUTROABS 5.0  --  10.9*  HGB 12.3 10.4* 11.2*  HCT 39.1 33.5* 35.8*  MCV 80.8  --  80.6  PLT 269  --  221    CBG: Recent Labs  Lab 02/20/21 2000 02/21/21 0003 02/21/21 0400 02/21/21 0743 02/21/21 1126  GLUCAP 187* 193* 189* 107* 95    Active Problems:   S/P laparoscopic sleeve gastrectomy   Time coordinating discharge: 15 min  Signed:  Gayland Curry, MD [facility], [place] [phone] 02/21/2021, 12:14 PM

## 2021-02-21 NOTE — Progress Notes (Signed)
Nutrition Education Note ° °Received consult for diet education for patient s/p bariatric surgery. ° °Discussed 2 week post op diet with pt. Emphasized that liquids must be non carbonated, non caffeinated, and sugar free. Fluid goals discussed. Pt to follow up with outpatient bariatric RD for further diet progression after 2 weeks. Multivitamins and minerals also reviewed. Teach back method used, pt expressed understanding, expect good compliance. ° °If nutrition issues arise, please consult RD. ° °Dameion Briles, MS, RD, LDN °Inpatient Clinical Dietitian °Contact information available via Amion ° ° °

## 2021-02-21 NOTE — Progress Notes (Signed)
24hr fluid recall prior to discharge: 681m (includes one protein shake).  Per dehydration protocol, will call pt to f/u within one week post op.

## 2021-02-21 NOTE — Progress Notes (Signed)
Patient alert and oriented, pain is controlled. Patient is tolerating fluids, advanced to protein shake today, patient is tolerating well.  Reviewed Gastric sleeve discharge instructions with patient and patient is able to articulate understanding.  Provided information on BELT program, Support Group and [facility] outpatient pharmacy. All questions answered, will continue to monitor.  

## 2021-02-21 NOTE — Plan of Care (Signed)
  Problem: Education: Goal: Knowledge of General Education information will improve Description: Including pain rating scale, medication(s)/side effects and non-pharmacologic comfort measures Outcome: Progressing   Problem: Health Behavior/Discharge Planning: Goal: Ability to manage health-related needs will improve Outcome: Progressing   Problem: Clinical Measurements: Goal: Ability to maintain clinical measurements within normal limits will improve Outcome: Progressing Goal: Will remain free from infection Outcome: Progressing Goal: Diagnostic test results will improve Outcome: Progressing Goal: Respiratory complications will improve Outcome: Progressing Goal: Cardiovascular complication will be avoided Outcome: Progressing   Problem: Activity: Goal: Risk for activity intolerance will decrease Outcome: Progressing   Problem: Nutrition: Goal: Adequate nutrition will be maintained Outcome: Progressing   Problem: Coping: Goal: Level of anxiety will decrease Outcome: Progressing   Problem: Elimination: Goal: Will not experience complications related to bowel motility Outcome: Progressing Goal: Will not experience complications related to urinary retention Outcome: Progressing   Problem: Pain Managment: Goal: General experience of comfort will improve Outcome: Progressing   Problem: Safety: Goal: Ability to remain free from injury will improve Outcome: Progressing   Problem: Skin Integrity: Goal: Risk for impaired skin integrity will decrease Outcome: Progressing   Problem: Education: Goal: Ability to state signs and symptoms to report to health care provider will improve Outcome: Progressing Goal: Knowledge of the prescribed self-care regimen will improve Outcome: Progressing Goal: Knowledge of discharge needs will improve Outcome: Progressing   Problem: Activity: Goal: Ability to tolerate increased activity will improve Outcome: Progressing   Problem:  Bowel/Gastric: Goal: Gastrointestinal status for postoperative course will improve Outcome: Progressing Goal: Occurrences of nausea will decrease Outcome: Progressing   Problem: Coping: Goal: Development of coping mechanisms to deal with changes in body function or appearance will improve Outcome: Progressing   Problem: Fluid Volume: Goal: Maintenance of adequate hydration will improve Outcome: Progressing   Problem: Nutritional: Goal: Nutritional status will improve Outcome: Progressing   Problem: Clinical Measurements: Goal: Will show no signs or symptoms of venous thromboembolism Outcome: Progressing Goal: Will remain free from infection Outcome: Progressing Goal: Will show no signs of GI Leak Outcome: Progressing   Problem: Respiratory: Goal: Will regain and/or maintain adequate ventilation Outcome: Progressing   Problem: Pain Management: Goal: Pain level will decrease Outcome: Progressing   Problem: Skin Integrity: Goal: Demonstration of wound healing without infection will improve Outcome: Progressing

## 2021-02-23 ENCOUNTER — Telehealth: Payer: Self-pay | Admitting: *Deleted

## 2021-02-23 NOTE — Telephone Encounter (Signed)
Transition Care Management Follow-up Telephone Call Date of discharge and from where: [facility] 02-21-2021 How have you been since you were released from the hospital? Feeling Good Any questions or concerns? No  Items Reviewed: Did the pt receive and understand the discharge instructions provided? Yes  Medications obtained and verified? Yes  Other? No  Any new allergies since your discharge? No  Dietary orders reviewed? Yes Do you have support at home? Yes   Home Care and Equipment/Supplies: Were home health services ordered? not applicable If so, what is the name of the agency?   Has the agency set up a time to come to the patient's home? not applicable Were any new equipment or medical supplies ordered?  No What is the name of the medical supply agency?  Were you able to get the supplies/equipment? not applicable Do you have any questions related to the use of the equipment or supplies? No  Functional Questionnaire: (I = Independent and D = Dependent) ADLs: I  Bathing/Dressing- I  Meal Prep- I  Eating- I  Maintaining continence- I  Transferring/Ambulation- I  Managing Meds- I  Follow up appointments reviewed:  PCP Hospital f/u appt confirmed? Yes  Scheduled to see 02-27-2021 Dr Lorayne Marek @ 10:45. [facility] f/u appt confirmed? Yes  Scheduled to see Dr. Loura Back on 03-10-2021 @ 3:00. Are transportation arrangements needed? No  If their condition worsens, is the pt aware to call PCP or go to the Emergency Dept.? Yes Was the patient provided with contact information for the PCP's office or ED? Yes Was to pt encouraged to call back with questions or concerns? Yes

## 2021-02-24 ENCOUNTER — Other Ambulatory Visit: Payer: Self-pay | Admitting: *Deleted

## 2021-02-24 ENCOUNTER — Telehealth (HOSPITAL_COMMUNITY): Payer: Self-pay | Admitting: *Deleted

## 2021-02-24 ENCOUNTER — Encounter: Payer: Self-pay | Admitting: *Deleted

## 2021-02-24 NOTE — Patient Outreach (Addendum)
[facility]) Care Management  02/24/2021  Shelly Cohen [birth year] [id number]   Transition of care call/case closure   Referral received:02/21/21 Initial outreach:02/24/21 Insurance: [employer]   Subjective: Initial successful telephone call to patient's preferred number in order to complete transition of care assessment; 2 HIPAA identifiers verified. Explained purpose of call and completed transition of care assessment.  Shelly Cohen states that she is doing great , she denies post-operative problems, says surgical incisions are unremarkable, steri strips in place ,states surgical pain well managed with prescribed medications. She reports tolerating taking shower and mobility in the home.She has incentive spirometry and reports using as prescribed, reinforced continued use. She discussed progressing well with po fluid intake  up to 50 oz of liquids , reports making sure she gets her protein in  and states getting in goal of 60 GM a day 2 shakes. She denies nausea, discussed pacing self toward goal. She denies bowel or bladder problems reports having bowel movements.   Reviewed accessing the following [place] Benefits : She discussed  any ongoing health conditions hypertension Pre Diabetes and states she has been active with Active health management for health coach program.  She does have the hospital indemnity plan and has made outreach to file a claim.  She  uses a [facility] outpatient pharmacy at [facility].     Objective:  Mrs.Shelly Cohen  was hospitalized at [facility] 8/22-8/23/22 for Laparoscopic sleeve gastrectomy  Comorbidities include: Obesity, hypertension , preDiabetes. She was discharged to home on 8/23/22without the need for home health services or DME.   Assessment:  Patient voices good understanding of all discharge instructions.  See transition of care flowsheet for assessment details.   Plan:  Reviewed  hospital discharge diagnosis of Laparoscopic sleeve gastrectomy  and discharge treatment plan using hospital discharge instructions, assessing medication adherence, reviewing problems requiring provider notification, and discussing the importance of follow up with surgeon, primary care provider and/or specialists as directed.  Reviewed [place] healthy lifestyle program information to receive discounted premium for  2023   Step 1: Get  your annual physical  Step 2: Complete your health assessment  Step 3:Identify your current health status and complete the corresponding action step between July 02, 2020 and March 02, 2021.    Using [place] website, verified that patient is an active participate in [place]'s Active Health Management chronic disease management program.    No ongoing care management needs identified so will close case to [facility] services and route successful outreach letter with [place] Management pamphlet and 24 Hour Nurse Line Magnet to [place] Management clinical pool to be mailed to patient's home address.  Thanked patient for their services to [facility].    Joylene Draft, RN, BSN  [place] Management Coordinator  [phone]- Mobile [phone]- Toll Free Main Office

## 2021-02-24 NOTE — Telephone Encounter (Signed)
1.  Tell me about your pain and pain management? Pt denies any pain.  2.  Let's talk about fluid intake.  How much total fluid are you taking in? Pt states that she is working to meet goal of 64 oz of fluid today.  Pt has consumed at least 55f oz over the past two days. Pt plans to drink rest of protein and water for the remainder of the day to meet goal.  Pt instructed to assess status and suggestions daily utilizing Hydration Action Plan on discharge folder and to call [facility] if in the "red zone".   3.  How much protein have you taken in the last 2 days? Pt states she is meeting her goal of 60g of protein each day with the protein water, water, and jello.  4.  Have you had nausea?  Tell me about when have experienced nausea and what you did to help? Pt denies nausea.   5.  Has the frequency or color changed with your urine? Pt states that she is urinating "fine" with no changes in frequency or urgency.     6.  Tell me what your incisions look like? "Incisions look fine". Pt denies a fever, chills.  Pt states incisions are not swollen, open, or draining.  Pt encouraged to call [facility] if incisions change.   7.  Have you been passing gas? BM? Pt states that she is having BMs. Last BM 02/23/21.  8.  If a problem or question were to arise who would you call?  Do you know contact numbers for [facility], and [facility]? Pt denies dehydration symptoms.  Pt can describe s/sx of dehydration.  Pt knows to call [facility] for surgical, [facility] for nutrition, and [facility] or concerns.   9.  How has the walking going? Pt states she is walking around and able to be active without difficulty.   10. Are you still using your incentive spirometer?  If so, how often? Pt states that she is using the I.S every hour. Pt encouraged to use incentive spirometer, at least 10x every hour while awake until she sees the surgeon.  11.  How are your vitamins and calcium going?  How are you taking them? Pt states that  she is taking her supplements and vitamins without difficulty.  Reminded patient that the first 30 days post-operatively are important for successful recovery.  Practice good hand hygiene, wearing a mask when appropriate (since optional in most places), and minimizing exposure to people who live outside of the home, especially if they are exhibiting any respiratory, GI, or illness-like symptoms.

## 2021-02-27 ENCOUNTER — Other Ambulatory Visit: Payer: Self-pay

## 2021-02-27 ENCOUNTER — Other Ambulatory Visit (HOSPITAL_COMMUNITY): Payer: Self-pay

## 2021-02-27 ENCOUNTER — Encounter: Payer: Self-pay | Admitting: Nurse Practitioner

## 2021-02-27 ENCOUNTER — Ambulatory Visit (INDEPENDENT_AMBULATORY_CARE_PROVIDER_SITE_OTHER): Payer: No Typology Code available for payment source | Admitting: Nurse Practitioner

## 2021-02-27 VITALS — BP 109/72 | HR 80 | Temp 97.0°F | Resp 16 | Wt 196.0 lb

## 2021-02-27 DIAGNOSIS — I9589 Other hypotension: Secondary | ICD-10-CM

## 2021-02-27 DIAGNOSIS — E039 Hypothyroidism, unspecified: Secondary | ICD-10-CM | POA: Diagnosis not present

## 2021-02-27 DIAGNOSIS — E861 Hypovolemia: Secondary | ICD-10-CM

## 2021-02-27 DIAGNOSIS — I10 Essential (primary) hypertension: Secondary | ICD-10-CM

## 2021-02-27 LAB — BASIC METABOLIC PANEL
BUN: 31 mg/dL — ABNORMAL HIGH (ref 6–23)
CO2: 23 mEq/L (ref 19–32)
Calcium: 10.6 mg/dL — ABNORMAL HIGH (ref 8.4–10.5)
Chloride: 102 mEq/L (ref 96–112)
Creatinine, Ser: 1.07 mg/dL (ref 0.40–1.20)
GFR: 56.78 mL/min — ABNORMAL LOW (ref >60.00)
Glucose, Bld: 86 mg/dL (ref 70–99)
Potassium: 4.5 mEq/L (ref 3.5–5.1)
Sodium: 137 mEq/L (ref 135–145)

## 2021-02-27 LAB — CBC
HCT: 37 % (ref 36.0–46.0)
Hemoglobin: 11.6 g/dL — ABNORMAL LOW (ref 12.0–15.0)
MCHC: 31.2 g/dL (ref 30.0–36.0)
MCV: 79.7 fl (ref 78.0–100.0)
Platelets: 266 10*3/uL (ref 150.0–400.0)
RBC: 4.64 Mil/uL (ref 3.87–5.11)
RDW: 17.2 % — ABNORMAL HIGH (ref 11.5–15.5)
WBC: 8.4 10*3/uL (ref 4.0–10.5)

## 2021-02-27 MED ORDER — CARVEDILOL 3.125 MG PO TABS
3.1250 mg | ORAL_TABLET | Freq: Two times a day (BID) | ORAL | 5 refills | Status: DC
  Filled 2021-02-27: qty 60, 30d supply, fill #0
  Filled 2021-03-26: qty 60, 30d supply, fill #1
  Filled 2021-04-28: qty 60, 30d supply, fill #2
  Filled 2021-05-28: qty 60, 30d supply, fill #3
  Filled 2021-06-26: qty 60, 30d supply, fill #4
  Filled 2021-08-04: qty 60, 30d supply, fill #5

## 2021-02-27 NOTE — Assessment & Plan Note (Signed)
Reports symptomatic hypotension since hospital discharge on 02/21/2021. Home BP reading: 99-79s/60s Home pulse: 70s Associated with weakness and dizziness. Denies any syncope or chest pain or palpitations She is on liquid diet and drinks about 64oz of liquid per day. She has not take any BP medication x 24hrs.  BP Readings from Last 3 Encounters:  02/27/21 109/72  02/21/21 139/70  02/13/21 (!) 138/58   Check cbc, bmp and tsh today Continue adequate oral hydration Continue to hold spirolactone and amlodipine Decrease coreg to 3.'125mg'$  BID, new rx sent Continue to monitor BP in AM and PM: Hold if BP<100/70. Take amlodipine if BP>140/80. F/up in 48month

## 2021-02-27 NOTE — Progress Notes (Signed)
Subjective:  Patient ID: Shelly Cohen, female    DOB: 24-Apr-1961  Age: 60 y.o. MRN: [id number]  CC: Follow-up (Hypotension post-op.)  HPI Accompanied by her sister from [place].  HTN (hypertension), benign Reports symptomatic hypotension since hospital discharge on 02/21/2021. Home BP reading: 99-79s/60s Home pulse: 70s Associated with weakness and dizziness. Denies any syncope or chest pain or palpitations She is on liquid diet and drinks about 64oz of liquid per day. She has not take any BP medication x 24hrs.  BP Readings from Last 3 Encounters:  02/27/21 109/72  02/21/21 139/70  02/13/21 (!) 138/58   Check cbc, bmp and tsh today Continue adequate oral hydration Continue to hold spirolactone and amlodipine Decrease coreg to 3.'125mg'$  BID, new rx sent Continue to monitor BP in AM and PM: Hold if BP<100/70. Take amlodipine if BP>140/80. F/up in 37month Reviewed past Medical, Social and Family history today.  Outpatient Medications Prior to Visit  Medication Sig Dispense Refill   amLODipine (NORVASC) 10 MG tablet TAKE 1 TABLET BY MOUTH DAILY 90 tablet 3   diclofenac Sodium (VOLTAREN) 1 % GEL Apply 2 g topically daily as needed (hip pain).     gabapentin (NEURONTIN) 100 MG capsule Take 2 capsules (200 mg total) by mouth every 12 (twelve) hours. 20 capsule 0   levothyroxine (SYNTHROID) 50 MCG tablet TAKE 1 TABLET BY MOUTH DAILY BEFORE BREAKFAST 90 tablet 1   lisinopril (ZESTRIL) 40 MG tablet TAKE 1 TABLET BY MOUTH ONCE A DAY 90 tablet 3   ondansetron (ZOFRAN-ODT) 4 MG disintegrating tablet Dissolve 1 tablet (4 mg total) by mouth every 6 (six) hours as needed for nausea or vomiting. 20 tablet 0   oxyCODONE (OXY IR/ROXICODONE) 5 MG immediate release tablet Take 1 tablet (5 mg total) by mouth every 6 (six) hours as needed for severe pain. 10 tablet 0   pantoprazole (PROTONIX) 40 MG tablet Take 1 tablet (40 mg total) by mouth daily. 90 tablet 0   spironolactone (ALDACTONE) 50 MG  tablet TAKE 1 TABLET BY MOUTH ONCE A DAY 90 tablet 3   carvedilol (COREG) 25 MG tablet Take 1 tablet by mouth 2 (two) times daily. 180 tablet 2   BIOTIN PO Take 1 tablet by mouth daily.     CALCIUM-VITAMIN D PO Take 1 tablet by mouth daily.     docusate sodium (COLACE) 100 MG capsule Take 100 mg by mouth every other day.     ferrous sulfate 325 (65 FE) MG tablet Take 325 mg by mouth daily with breakfast.     fluticasone (FLONASE) 50 MCG/ACT nasal spray Place 1 spray into both nostrils daily as needed for allergies or rhinitis.     KRILL OIL PO Take 1 capsule by mouth daily.     latanoprost (XALATAN) 0.005 % ophthalmic solution Place 1 drop into both eyes every evening (Patient taking differently: Place 1 drop into both eyes at bedtime.) 5 mL 6   MELATONIN PO Take 1 tablet by mouth at bedtime.     Multiple Vitamin (MULTI-VITAMINS) TABS Take 1 tablet by mouth daily.      No facility-administered medications prior to visit.    ROS See HPI  Objective:  BP 109/72 (BP Location: Left Arm, Patient Position: Sitting, Cuff Size: Normal)   Pulse 80   Temp (!) 97 F (36.1 C) (Oral)   Resp 16   Wt 196 lb (88.9 kg)   SpO2 100%   BMI 35.85 kg/m   Physical Exam Vitals reviewed.  Cardiovascular:     Rate and Rhythm: Normal rate and regular rhythm.     Pulses: Normal pulses.     Heart sounds: Normal heart sounds.  Pulmonary:     Effort: Pulmonary effort is normal.     Breath sounds: Normal breath sounds.  Abdominal:     General: Bowel sounds are normal. There is no distension.  Musculoskeletal:     Right lower leg: No edema.     Left lower leg: No edema.  Neurological:     Mental Status: She is alert and oriented to person, place, and time.  Psychiatric:        Mood and Affect: Mood normal.        Behavior: Behavior normal.        Thought Content: Thought content normal.   Assessment & Plan:  This visit occurred during the SARS-CoV-2 public health emergency.  Safety protocols were in  place, including screening questions prior to the visit, additional usage of staff PPE, and extensive cleaning of exam room while observing appropriate contact time as indicated for disinfecting solutions.   Izumi was seen today for follow-up.  Diagnoses and all orders for this visit:  Hypotension due to hypovolemia -     Basic metabolic panel -     CBC  HTN (hypertension), benign -     carvedilol (COREG) 3.125 MG tablet; Take 1 tablet (3.125 mg total) by mouth 2 (two) times daily.  Hypothyroidism, unspecified type -     Thyroid Panel With TSH  Normal lab results. Problem List Items Addressed This Visit       Cardiovascular and Mediastinum   HTN (hypertension), benign    Reports symptomatic hypotension since hospital discharge on 02/21/2021. Home BP reading: 99-79s/60s Home pulse: 70s Associated with weakness and dizziness. Denies any syncope or chest pain or palpitations She is on liquid diet and drinks about 64oz of liquid per day. She has not take any BP medication x 24hrs.  BP Readings from Last 3 Encounters:  02/27/21 109/72  02/21/21 139/70  02/13/21 (!) 138/58   Check cbc, bmp and tsh today Continue adequate oral hydration Continue to hold spirolactone and amlodipine Decrease coreg to 3.'125mg'$  BID, new rx sent Continue to monitor BP in AM and PM: Hold if BP<100/70. Take amlodipine if BP>140/80. F/up in 56month     Relevant Medications   carvedilol (COREG) 3.125 MG tablet     Endocrine   Hypothyroidism   Relevant Medications   carvedilol (COREG) 3.125 MG tablet   Other Relevant Orders   Thyroid Panel With TSH (Completed)   Other Visit Diagnoses     Hypotension due to hypovolemia    -  Primary   Relevant Medications   carvedilol (COREG) 3.125 MG tablet   Other Relevant Orders   Basic metabolic panel (Completed)   CBC (Completed)       Follow-up: Return in about 4 weeks (around 03/27/2021) for hypotension.  CWilfred Lacy NP

## 2021-02-27 NOTE — Patient Instructions (Addendum)
Hold amlodipine and spironolactone Resume coreg at 3.'125mg'$  BID Hold if BP<100/70 Take amlodipine if BP>140/80.  Go to lab for blood draw.

## 2021-02-28 LAB — THYROID PANEL WITH TSH
Free Thyroxine Index: 3.1 (ref 1.4–3.8)
T3 Uptake: 31 % (ref 22–35)
T4, Total: 9.9 ug/dL (ref 5.1–11.9)
TSH: 0.62 mIU/L (ref 0.40–4.50)

## 2021-03-07 ENCOUNTER — Other Ambulatory Visit: Payer: Self-pay

## 2021-03-07 ENCOUNTER — Encounter: Payer: No Typology Code available for payment source | Attending: General Surgery | Admitting: Skilled Nursing Facility1

## 2021-03-07 DIAGNOSIS — E669 Obesity, unspecified: Secondary | ICD-10-CM | POA: Diagnosis present

## 2021-03-09 NOTE — Progress Notes (Signed)
2 Week Post-Operative Nutrition Class   Patient was seen on 03/07/2021 for Post-Operative Nutrition education at [facility].    Surgery date: 02/20/2021 Surgery type: sleeve Start weight at [facility]: 219.2 Weight today: 190 pounds Bowel Habits: Every day to every other day no complaints   Body Composition Scale 03/07/2021  Current Body Weight 190  Total Body Fat % 42  Visceral Fat 13  Fat-Free Mass % 57.9   Total Body Water % 43.4  Muscle-Mass lbs 27.1  BMI 34.5  Body Fat Displacement          Torso  lbs 49.3         Left Leg  lbs 9.8         Right Leg  lbs 9.8         Left Arm  lbs 4.9         Right Arm   lbs 4.9      The following the learning objectives were met by the patient during this course: Identifies Phase 3 (Soft, High Proteins) Dietary Goals and will begin from 2 weeks post-operatively to 2 months post-operatively Identifies appropriate sources of fluids and proteins  Identifies appropriate fat sources and healthy verses unhealthy fat types   States protein recommendations and appropriate sources post-operatively Identifies the need for appropriate texture modifications, mastication, and bite sizes when consuming solids Identifies appropriate fat consumption and sources Identifies appropriate multivitamin and calcium sources post-operatively Describes the need for physical activity post-operatively and will follow MD recommendations States when to call healthcare provider regarding medication questions or post-operative complications   Handouts given during class include: Phase 3A: Soft, High Protein Diet Handout Phase 3 High Protein Meals Healthy Fats   Follow-Up Plan: Patient will follow-up at [facility] in 6 weeks for 2 month post-op nutrition visit for diet advancement per MD.

## 2021-03-13 ENCOUNTER — Telehealth: Payer: Self-pay | Admitting: Skilled Nursing Facility1

## 2021-03-13 NOTE — Telephone Encounter (Signed)
RD called pt to verify fluid intake once starting soft, solid proteins 2 week post-bariatric surgery.   Daily Fluid intake: 64+ Daily Protein intake: 60+ Bowel Habits: every other day to every day  Concerns/issues:   N/A

## 2021-03-27 ENCOUNTER — Other Ambulatory Visit (HOSPITAL_COMMUNITY): Payer: Self-pay

## 2021-04-04 ENCOUNTER — Other Ambulatory Visit: Payer: Self-pay

## 2021-04-04 ENCOUNTER — Ambulatory Visit (INDEPENDENT_AMBULATORY_CARE_PROVIDER_SITE_OTHER): Payer: No Typology Code available for payment source | Admitting: Nurse Practitioner

## 2021-04-04 ENCOUNTER — Encounter: Payer: Self-pay | Admitting: Nurse Practitioner

## 2021-04-04 VITALS — BP 130/74 | HR 86 | Temp 96.7°F | Wt 188.2 lb

## 2021-04-04 DIAGNOSIS — R202 Paresthesia of skin: Secondary | ICD-10-CM

## 2021-04-04 DIAGNOSIS — I10 Essential (primary) hypertension: Secondary | ICD-10-CM | POA: Diagnosis not present

## 2021-04-04 DIAGNOSIS — R7303 Prediabetes: Secondary | ICD-10-CM | POA: Diagnosis not present

## 2021-04-04 DIAGNOSIS — R200 Anesthesia of skin: Secondary | ICD-10-CM | POA: Insufficient documentation

## 2021-04-04 NOTE — Assessment & Plan Note (Addendum)
Onset 16month ago, intermittent, worse with leg elevation at hs. No calf pain, no LE edema.  Abnormal foot exam today: diminished vibratory sensation and microfilament in toes (bilateral). Normal cbc, cmp and Thyroid panel 01/2021 Check B12, folate and vit. D Consider lumbar spine radiculopathy?

## 2021-04-04 NOTE — Progress Notes (Signed)
Subjective:  Patient ID: Shelly Cohen, female    DOB: 02/15/1961  Age: 60 y.o. MRN: [id number]  CC: Follow-up (1 month f/u for hypotension. /Pt states she checks BP at home and has noticed improvement. )  HPI  Prediabetes Fasting glucose is normal Last HgbA1c of 6.2% 01/2021: prediabetes. Continue with healthy diet and exercise.  HTN (hypertension), benign Home BP 110-120/70s Current use of coreg 3.125mg  BID only BP Readings from Last 3 Encounters:  04/04/21 130/74  02/27/21 109/72  02/21/21 139/70   Maintain current med dose F/up in 66months  Numbness and tingling of foot Onset 63month ago, intermittent, worse with leg elevation at hs. No calf pain, no LE edema.  Abnormal foot exam today: diminished vibratory sensation and microfilament in toes (bilateral). Normal cbc, cmp and Thyroid panel 01/2021 Check B12, folate and vit. D Consider lumbar spine radiculopathy? BP Readings from Last 3 Encounters:  04/04/21 130/74  02/27/21 109/72  02/21/21 139/70    Wt Readings from Last 3 Encounters:  04/04/21 188 lb 3.2 oz (85.4 kg)  03/09/21 190 lb (86.2 kg)  02/27/21 196 lb (88.9 kg)    Reviewed past Medical, Social and Family history today.  Outpatient Medications Prior to Visit  Medication Sig Dispense Refill   BIOTIN PO Take 1 tablet by mouth daily.     CALCIUM-VITAMIN D PO Take 1 tablet by mouth daily.     carvedilol (COREG) 3.125 MG tablet Take 1 tablet (3.125 mg total) by mouth 2 (two) times daily. 60 tablet 5   docusate sodium (COLACE) 100 MG capsule Take 100 mg by mouth every other day.     fluticasone (FLONASE) 50 MCG/ACT nasal spray Place 1 spray into both nostrils daily as needed for allergies or rhinitis.     KRILL OIL PO Take 1 capsule by mouth daily.     levothyroxine (SYNTHROID) 50 MCG tablet TAKE 1 TABLET BY MOUTH DAILY BEFORE BREAKFAST 90 tablet 1   MELATONIN PO Take 1 tablet by mouth at bedtime.     pantoprazole (PROTONIX) 40 MG tablet Take 1 tablet  (40 mg total) by mouth daily. 90 tablet 0   latanoprost (XALATAN) 0.005 % ophthalmic solution Place 1 drop into both eyes every evening (Patient taking differently: Place 1 drop into both eyes at bedtime.) 5 mL 6   amLODipine (NORVASC) 10 MG tablet TAKE 1 TABLET BY MOUTH DAILY 90 tablet 3   diclofenac Sodium (VOLTAREN) 1 % GEL Apply 2 g topically daily as needed (hip pain).     ferrous sulfate 325 (65 FE) MG tablet Take 325 mg by mouth daily with breakfast.     gabapentin (NEURONTIN) 100 MG capsule Take 2 capsules (200 mg total) by mouth every 12 (twelve) hours. 20 capsule 0   lisinopril (ZESTRIL) 40 MG tablet TAKE 1 TABLET BY MOUTH ONCE A DAY 90 tablet 3   Multiple Vitamin (MULTI-VITAMINS) TABS Take 1 tablet by mouth daily.      ondansetron (ZOFRAN-ODT) 4 MG disintegrating tablet Dissolve 1 tablet (4 mg total) by mouth every 6 (six) hours as needed for nausea or vomiting. 20 tablet 0   oxyCODONE (OXY IR/ROXICODONE) 5 MG immediate release tablet Take 1 tablet (5 mg total) by mouth every 6 (six) hours as needed for severe pain. 10 tablet 0   spironolactone (ALDACTONE) 50 MG tablet TAKE 1 TABLET BY MOUTH ONCE A DAY 90 tablet 3   No facility-administered medications prior to visit.    ROS See HPI  Objective:  BP 130/74 (BP Location: Left Arm, Patient Position: Sitting, Cuff Size: Large)   Pulse 86   Temp (!) 96.7 F (35.9 C) (Temporal)   Wt 188 lb 3.2 oz (85.4 kg)   SpO2 98%   BMI 34.42 kg/m   Physical Exam Cardiovascular:     Pulses:          Dorsalis pedis pulses are 2+ on the right side and 2+ on the left side.       Posterior tibial pulses are 2+ on the right side and 2+ on the left side.  Musculoskeletal:     Right lower leg: No edema.     Left lower leg: No edema.     Right foot: Normal range of motion. No bunion, Charcot foot, foot drop or prominent metatarsal heads.     Left foot: Normal range of motion. No bunion, Charcot foot, foot drop or prominent metatarsal heads.   Feet:     Right foot:     Protective Sensation: 9 sites tested.  6 sites sensed.     Skin integrity: Skin integrity normal. No erythema, warmth, callus or fissure.     Toenail Condition: Right toenails are normal.     Left foot:     Protective Sensation: 9 sites tested.  6 sites sensed.     Skin integrity: Skin integrity normal. No erythema, warmth, callus or fissure.     Toenail Condition: Left toenails are normal.  Skin:    General: Skin is warm and dry.  Neurological:     Mental Status: She is alert and oriented to person, place, and time.   Assessment & Plan:  This visit occurred during the SARS-CoV-2 public health emergency.  Safety protocols were in place, including screening questions prior to the visit, additional usage of staff PPE, and extensive cleaning of exam room while observing appropriate contact time as indicated for disinfecting solutions.   Kelissa was seen today for follow-up.  Diagnoses and all orders for this visit:  HTN (hypertension), benign  Numbness and tingling of foot -     B12 -     Folate -     Vitamin D (25 hydroxy)  Prediabetes   Problem List Items Addressed This Visit       Cardiovascular and Mediastinum   HTN (hypertension), benign - Primary    Home BP 110-120/70s Current use of coreg 3.125mg  BID only BP Readings from Last 3 Encounters:  04/04/21 130/74  02/27/21 109/72  02/21/21 139/70   Maintain current med dose F/up in 78months        Other   Numbness and tingling of foot    Onset 39month ago, intermittent, worse with leg elevation at hs. No calf pain, no LE edema.  Abnormal foot exam today: diminished vibratory sensation and microfilament in toes (bilateral). Normal cbc, cmp and Thyroid panel 01/2021 Check B12, folate and vit. D Consider lumbar spine radiculopathy?      Relevant Orders   B12   Folate   Vitamin D (25 hydroxy)   Prediabetes    Fasting glucose is normal Last HgbA1c of 6.2% 01/2021: prediabetes. Continue  with healthy diet and exercise.       Follow-up: Return for maintain upcoming appt in February.  Wilfred Lacy, NP

## 2021-04-04 NOTE — Assessment & Plan Note (Signed)
Home BP 110-120/70s Current use of coreg 3.125mg  BID only BP Readings from Last 3 Encounters:  04/04/21 130/74  02/27/21 109/72  02/21/21 139/70   Maintain current med dose F/up in 21months

## 2021-04-04 NOTE — Assessment & Plan Note (Signed)
Fasting glucose is normal Last HgbA1c of 6.2% 01/2021: prediabetes. Continue with healthy diet and exercise.

## 2021-04-04 NOTE — Patient Instructions (Addendum)
Fasting glucose is normal Last HgbA1c of 6.2%: prediabetes. Continue with healthy diet and exercise.  Maintain current medications.

## 2021-04-05 LAB — FOLATE: Folate: >23.4 ng/mL (ref >5.9)

## 2021-04-05 LAB — VITAMIN D 25 HYDROXY (VIT D DEFICIENCY, FRACTURES): VITD: 45.68 ng/mL (ref 30.00–100.00)

## 2021-04-05 LAB — VITAMIN B12: Vitamin B-12: >1550 pg/mL — ABNORMAL HIGH (ref 211–911)

## 2021-04-05 NOTE — Addendum Note (Signed)
Addended by: Wilfred Lacy L on: 04/05/2021 01:23 PM   Modules accepted: Orders

## 2021-04-10 ENCOUNTER — Other Ambulatory Visit: Payer: Self-pay

## 2021-04-10 ENCOUNTER — Telehealth: Payer: Self-pay | Admitting: Nurse Practitioner

## 2021-04-10 ENCOUNTER — Other Ambulatory Visit: Payer: No Typology Code available for payment source

## 2021-04-10 ENCOUNTER — Other Ambulatory Visit (HOSPITAL_COMMUNITY): Payer: Self-pay

## 2021-04-10 ENCOUNTER — Ambulatory Visit (INDEPENDENT_AMBULATORY_CARE_PROVIDER_SITE_OTHER): Payer: No Typology Code available for payment source

## 2021-04-10 DIAGNOSIS — R200 Anesthesia of skin: Secondary | ICD-10-CM

## 2021-04-10 DIAGNOSIS — R202 Paresthesia of skin: Secondary | ICD-10-CM

## 2021-04-10 MED ORDER — GABAPENTIN 100 MG PO CAPS
100.0000 mg | ORAL_CAPSULE | Freq: Every day | ORAL | 5 refills | Status: DC
  Filled 2021-04-10: qty 30, 30d supply, fill #0
  Filled 2021-05-07: qty 30, 30d supply, fill #1
  Filled 2021-06-06: qty 30, 30d supply, fill #2
  Filled 2021-07-10: qty 30, 30d supply, fill #3

## 2021-04-10 NOTE — Telephone Encounter (Signed)
-----   Message from Arcelia Jew, [place] sent at 04/07/2021  8:53 AM EDT ----- Pt states she would like to start Gabapentin.

## 2021-04-12 ENCOUNTER — Ambulatory Visit: Payer: No Typology Code available for payment source | Attending: Internal Medicine

## 2021-04-12 DIAGNOSIS — Z23 Encounter for immunization: Secondary | ICD-10-CM

## 2021-04-12 NOTE — Progress Notes (Signed)
   Covid-19 Vaccination Clinic  Name:  Shelly Cohen    MRN: [id number] DOB: 1960/07/19  04/12/2021  Shelly Cohen was observed post Covid-19 immunization for 15 minutes without incident. She was provided with Vaccine Information Sheet and instruction to access the V-Safe system.   Shelly Cohen was instructed to call 911 with any severe reactions post vaccine: Difficulty breathing  Swelling of face and throat  A fast heartbeat  A bad rash all over body  Dizziness and weakness

## 2021-04-13 ENCOUNTER — Ambulatory Visit: Payer: No Typology Code available for payment source

## 2021-04-20 ENCOUNTER — Other Ambulatory Visit: Payer: Self-pay

## 2021-04-20 ENCOUNTER — Encounter: Payer: No Typology Code available for payment source | Attending: General Surgery | Admitting: Skilled Nursing Facility1

## 2021-04-20 DIAGNOSIS — E669 Obesity, unspecified: Secondary | ICD-10-CM | POA: Insufficient documentation

## 2021-04-20 DIAGNOSIS — E119 Type 2 diabetes mellitus without complications: Secondary | ICD-10-CM | POA: Insufficient documentation

## 2021-04-20 NOTE — Progress Notes (Signed)
Bariatric Nutrition Follow-Up Visit Medical Nutrition Therapy    Sleeve gastrectomy  NUTRITION ASSESSMENT      Anthropometrics  Start weight at [facility]: 219.2 lbs (date: 09/21/2020)  Weight: 185 pounds   Clinical  Medical hx: HTN, DM Medications: see list Labs: A1C 6.5, hemoglobin 11.7 Notable signs/symptoms: stiff hip Any previous deficiencies? No   Body Composition Scale 03/07/2021 04/20/2021  Current Body Weight 190 185  Total Body Fat % 42 40.6  Visceral Fat 13 13  Fat-Free Mass % 57.9 59.3   Total Body Water % 43.4 44.1  Muscle-Mass lbs 27.1 27.7  BMI 34.5 33.6  Body Fat Displacement           Torso  lbs 49.3 46.5         Left Leg  lbs 9.8 9.3         Right Leg  lbs 9.8 9.3         Left Arm  lbs 4.9 4.6         Right Arm   lbs 4.9 4.6     Lifestyle & Dietary Hx  Pt states she does not check her blood sugars.   Pt states she was getting nerve pain in her toes but with gabapentin that has subsided.    Pt is extremely motivated and does not emotionally eat or experience cravings. Pt enjoys working out and highly motivated to not only continue but also t increase duration and intensity.   Estimated daily fluid intake: 64+ oz Estimated daily protein intake: 80+ g Supplements: multi and calcium Current average weekly physical activity: walking 3 days a week 30 minutes, aerobics stepping   24-Hr Dietary Recall First Meal: protein shake Snack:   Second Meal: 3-4 ounces meat Snack:  cheese Third Meal: salmon or other fish Snack: sugar free Beverages: water, water + flavoring, 24 ounces fat free milk  Post-Op Goals/ Signs/ Symptoms Using straws: no Drinking while eating: no Chewing/swallowing difficulties: no Changes in vision: no Changes to mood/headaches: no Hair loss/changes to skin/nails: no Difficulty focusing/concentrating: no Sweating: no Limb weakness: no Dizziness/lightheadedness: no Palpitations: no  Carbonated/caffeinated beverages:  no N/V/D/C/Gas: no Abdominal pain: no Dumping syndrome: no    NUTRITION DIAGNOSIS  Overweight/obesity ([place]-3.3) related to past poor dietary habits and physical inactivity as evidenced by completed bariatric surgery and following dietary guidelines for continued weight loss and healthy nutrition status.     NUTRITION INTERVENTION Nutrition counseling (C-1) and education (E-2) to facilitate bariatric surgery goals, including: Diet advancement to the next phase (phase 4) now including non starchy vegetables The importance of consuming adequate calories as well as certain nutrients daily due to the body's need for essential vitamins, minerals, and fats The importance of daily physical activity and to reach a goal of at least 150 minutes of moderate to vigorous physical activity weekly (or as directed by their physician) due to benefits such as increased musculature and improved lab values The importance of intuitive eating specifically learning hunger-satiety cues and understanding the importance of learning a new body: The importance of mindful eating to avoid grazing behaviors   Handouts Provided Include  Phase 4  Learning Style & Readiness for Change Teaching method utilized: Visual & Auditory  Demonstrated degree of understanding via: Teach Back  Readiness Level: action Barriers to learning/adherence to lifestyle change: none identified   RD's Notes for Next Visit Assess adherence to pt chosen goals    MONITORING & EVALUATION Dietary intake, weekly physical activity, body weight  Next  Steps Patient is to follow-up in 2 months

## 2021-04-28 ENCOUNTER — Other Ambulatory Visit (HOSPITAL_COMMUNITY): Payer: Self-pay

## 2021-05-04 ENCOUNTER — Other Ambulatory Visit (HOSPITAL_BASED_OUTPATIENT_CLINIC_OR_DEPARTMENT_OTHER): Payer: Self-pay

## 2021-05-04 MED ORDER — PFIZER COVID-19 VAC BIVALENT 30 MCG/0.3ML IM SUSP
INTRAMUSCULAR | 0 refills | Status: DC
  Filled 2021-05-04: qty 0.3, 1d supply, fill #0

## 2021-05-08 ENCOUNTER — Other Ambulatory Visit (HOSPITAL_COMMUNITY): Payer: Self-pay

## 2021-05-21 ENCOUNTER — Other Ambulatory Visit: Payer: Self-pay

## 2021-05-22 ENCOUNTER — Other Ambulatory Visit (HOSPITAL_COMMUNITY): Payer: Self-pay

## 2021-05-23 ENCOUNTER — Other Ambulatory Visit: Payer: Self-pay

## 2021-05-23 ENCOUNTER — Other Ambulatory Visit (HOSPITAL_COMMUNITY): Payer: Self-pay

## 2021-05-24 ENCOUNTER — Other Ambulatory Visit (HOSPITAL_COMMUNITY): Payer: Self-pay

## 2021-05-28 ENCOUNTER — Other Ambulatory Visit (HOSPITAL_COMMUNITY): Payer: Self-pay

## 2021-05-29 ENCOUNTER — Other Ambulatory Visit (HOSPITAL_COMMUNITY): Payer: Self-pay

## 2021-06-01 ENCOUNTER — Other Ambulatory Visit (HOSPITAL_COMMUNITY): Payer: Self-pay

## 2021-06-01 MED ORDER — PANTOPRAZOLE SODIUM 40 MG PO TBEC
40.0000 mg | DELAYED_RELEASE_TABLET | Freq: Every day | ORAL | 0 refills | Status: DC
  Filled 2021-06-01: qty 90, 90d supply, fill #0

## 2021-06-06 ENCOUNTER — Other Ambulatory Visit (HOSPITAL_COMMUNITY): Payer: Self-pay

## 2021-06-14 ENCOUNTER — Ambulatory Visit: Payer: No Typology Code available for payment source | Admitting: Skilled Nursing Facility1

## 2021-06-21 ENCOUNTER — Other Ambulatory Visit (HOSPITAL_COMMUNITY): Payer: Self-pay

## 2021-06-21 MED ORDER — LATANOPROST 0.005 % OP SOLN
OPHTHALMIC | 6 refills | Status: DC
  Filled 2021-06-21: qty 5, 50d supply, fill #0
  Filled 2021-09-03: qty 5, 50d supply, fill #1
  Filled 2021-12-15: qty 5, 40d supply, fill #2
  Filled 2022-03-16: qty 5, 50d supply, fill #3
  Filled 2022-05-10: qty 5, 50d supply, fill #4

## 2021-06-27 ENCOUNTER — Other Ambulatory Visit (HOSPITAL_COMMUNITY): Payer: Self-pay

## 2021-06-29 ENCOUNTER — Ambulatory Visit: Payer: No Typology Code available for payment source | Admitting: Skilled Nursing Facility1

## 2021-07-03 ENCOUNTER — Other Ambulatory Visit: Payer: Self-pay

## 2021-07-03 ENCOUNTER — Encounter: Payer: No Typology Code available for payment source | Attending: General Surgery | Admitting: Skilled Nursing Facility1

## 2021-07-03 DIAGNOSIS — E669 Obesity, unspecified: Secondary | ICD-10-CM | POA: Insufficient documentation

## 2021-07-03 NOTE — Progress Notes (Signed)
Bariatric Nutrition Follow-Up Visit Medical Nutrition Therapy    Sleeve gastrectomy 02/20/2021  NUTRITION ASSESSMENT      Anthropometrics  Start weight at [facility]: 219.2 lbs (date: 09/21/2020)  Weight: 182.1 pounds   Clinical  Medical hx: HTN, DM Medications: see list Labs: A1C 6.5, hemoglobin 11.7 Notable signs/symptoms: stiff hip Any previous deficiencies? No   Body Composition Scale 03/07/2021 04/20/2021 07/03/2021  Current Body Weight 190 185 182.1  Total Body Fat % 42 40.6 40.4  Visceral Fat 13 13 13   Fat-Free Mass % 57.9 59.3 59.5   Total Body Water % 43.4 44.1 44.2  Muscle-Mass lbs 27.1 27.7 27.5  BMI 34.5 33.6 33.1  Body Fat Displacement            Torso  lbs 49.3 46.5 45.5         Left Leg  lbs 9.8 9.3 9.1         Right Leg  lbs 9.8 9.3 9.1         Left Arm  lbs 4.9 4.6 4.5         Right Arm   lbs 4.9 4.6 4.5     Lifestyle & Dietary Hx  Pt states she does not check her blood sugars.    Pt states she is worried about her stall since working out but feels so much better so trying not to let that get to her.  Pt states when there was a cold snap it hurt her hip so bad she could not be active but as soon as she was better she got back into it.   Estimated daily fluid intake: 64+ oz Estimated daily protein intake: 80+ g Supplements: multi and calcium Current average weekly physical activity: walking 3 days a week 60 minutes, 2 days a week aerobics stepping 15 minutes  24-Hr Dietary Recall First Meal: protein shake Snack:  egg Second Meal 12: 3 ounces [place] meatloaf + broccoli Snack: Third Meal: salmon or other fish + non starchy vegetables or beans Snack: fruit or no sugar added applesauce Beverages: water, water + flavoring, 24 ounces fat free milk  Post-Op Goals/ Signs/ Symptoms Using straws: no Drinking while eating: no Chewing/swallowing difficulties: no Changes in vision: no Changes to mood/headaches: no Hair loss/changes to skin/nails:  no Difficulty focusing/concentrating: no Sweating: no Limb weakness: no Dizziness/lightheadedness: no Palpitations: no  Carbonated/caffeinated beverages: no N/V/D/C/Gas: no Abdominal pain: no Dumping syndrome: no    NUTRITION DIAGNOSIS  Overweight/obesity ([place]-3.3) related to past poor dietary habits and physical inactivity as evidenced by completed bariatric surgery and following dietary guidelines for continued weight loss and healthy nutrition status.     NUTRITION INTERVENTION Nutrition counseling (C-1) and education (E-2) to facilitate bariatric surgery goals, including:  The importance of consuming adequate calories as well as certain nutrients daily due to the body's need for essential vitamins, minerals, and fats The importance of daily physical activity and to reach a goal of at least 150 minutes of moderate to vigorous physical activity weekly (or as directed by their physician) due to benefits such as increased musculature and improved lab values The importance of intuitive eating specifically learning hunger-satiety cues and understanding the importance of learning a new body: The importance of mindful eating to avoid grazing behaviors  Encouraged patient to honor their body's internal hunger and fullness cues.  Throughout the day, check in mentally and rate hunger. Stop eating when satisfied not full regardless of how much food is left on the plate.  Get  more if still hungry 20-30 minutes later.  The key is to honor satisfaction so throughout the meal, rate fullness factor and stop when comfortably satisfied not physically full. The key is to honor hunger and fullness without any feelings of guilt or shame.  Pay attention to what the internal cues are, rather than any external factors. This will enhance the confidence you have in listening to your own body and following those internal cues enabling you to increase how often you eat when you are hungry not out of appetite and stop  when you are satisfied not full.  Encouraged pt to continue to eat balanced meals inclusive of non starchy vegetables 2 times a day 7 days a week Encouraged pt to choose lean protein sources: limiting beef, pork, sausage, hotdogs, and lunch meat Encourage pt to choose healthy fats such as plant based limiting animal fats Encouraged pt to continue to drink a minium 64 fluid ounces with half being plain water to satisfy proper hydration   Handouts Provided Include  Phase 5  Learning Style & Readiness for Change Teaching method utilized: Visual & Auditory  Demonstrated degree of understanding via: Teach Back  Readiness Level: action Barriers to learning/adherence to lifestyle change: none identified   RD's Notes for Next Visit Assess adherence to pt chosen goals    MONITORING & EVALUATION Dietary intake, weekly physical activity, body weight  Next Steps Patient is to follow-up in 2 months

## 2021-07-04 ENCOUNTER — Telehealth: Payer: No Typology Code available for payment source | Admitting: Physician Assistant

## 2021-07-04 ENCOUNTER — Other Ambulatory Visit (HOSPITAL_COMMUNITY): Payer: Self-pay

## 2021-07-04 DIAGNOSIS — J069 Acute upper respiratory infection, unspecified: Secondary | ICD-10-CM

## 2021-07-04 MED ORDER — PROMETHAZINE-DM 6.25-15 MG/5ML PO SYRP
5.0000 mL | ORAL_SOLUTION | Freq: Four times a day (QID) | ORAL | 0 refills | Status: DC | PRN
  Filled 2021-07-04: qty 118, 6d supply, fill #0

## 2021-07-04 MED ORDER — BENZONATATE 100 MG PO CAPS
100.0000 mg | ORAL_CAPSULE | Freq: Three times a day (TID) | ORAL | 0 refills | Status: DC | PRN
  Filled 2021-07-04: qty 30, 10d supply, fill #0

## 2021-07-04 MED ORDER — IPRATROPIUM BROMIDE 0.03 % NA SOLN
2.0000 | Freq: Two times a day (BID) | NASAL | 0 refills | Status: DC
  Filled 2021-07-04: qty 30, 43d supply, fill #0

## 2021-07-04 NOTE — Progress Notes (Signed)
E-Visit for Upper Respiratory Infection   We are sorry you are not feeling well.  Here is how we plan to help!  Based on what you have shared with me, it looks like you may have a viral upper respiratory infection.  Upper respiratory infections are caused by a large number of viruses; however, rhinovirus is the most common cause.   Symptoms vary from person to person, with common symptoms including sore throat, cough, fatigue or lack of energy and feeling of general discomfort.  A low-grade fever of up to 100.4 may present, but is often uncommon.  Symptoms vary however, and are closely related to a person's age or underlying illnesses.  The most common symptoms associated with an upper respiratory infection are nasal discharge or congestion, cough, sneezing, headache and pressure in the ears and face.  These symptoms usually persist for about 3 to 10 days, but can last up to 2 weeks.  It is important to know that upper respiratory infections do not cause serious illness or complications in most cases.    Upper respiratory infections can be transmitted from person to person, with the most common method of transmission being a person's hands.  The virus is able to live on the skin and can infect other persons for up to 2 hours after direct contact.  Also, these can be transmitted when someone coughs or sneezes; thus, it is important to cover the mouth to reduce this risk.  To keep the spread of the illness at [place], good hand hygiene is very important.  This is an infection that is most likely caused by a virus. There are no specific treatments other than to help you with the symptoms until the infection runs its course.  We are sorry you are not feeling well.  Here is how we plan to help!   For nasal congestion, you may use an oral decongestants such as Mucinex D or if you have glaucoma or high blood pressure use plain Mucinex.  Saline nasal spray or nasal drops can help and can safely be used as often as  needed for congestion.  For your congestion, I have prescribed Ipratropium Bromide nasal spray 0.03% two sprays in each nostril 2-3 times a day  If you do not have a history of heart disease, hypertension, diabetes or thyroid disease, prostate/bladder issues or glaucoma, you may also use Sudafed to treat nasal congestion.  It is highly recommended that you consult with a pharmacist or your primary care physician to ensure this medication is safe for you to take.     If you have a cough, you may use cough suppressants such as Delsym and Robitussin.  If you have glaucoma or high blood pressure, you can also use Coricidin HBP.   For cough I have prescribed for you A prescription cough medication called Tessalon Perles 100 mg. You may take 1-2 capsules every 8 hours as needed for cough and Promethazine DM (can take together with tessalon perles or alternate them).  If you have a sore or scratchy throat, use a saltwater gargle-  to  teaspoon of salt dissolved in a 4-ounce to 8-ounce glass of warm water.  Gargle the solution for approximately 15-30 seconds and then spit.  It is important not to swallow the solution.  You can also use throat lozenges/cough drops and Chloraseptic spray to help with throat pain or discomfort.  Warm or cold liquids can also be helpful in relieving throat pain.  For headache, pain or  general discomfort, you can use Ibuprofen or Tylenol as directed.   Some authorities believe that zinc sprays or the use of Echinacea may shorten the course of your symptoms.   HOME CARE Only take medications as instructed by your medical team. Be sure to drink plenty of fluids. Water is fine as well as fruit juices, sodas and electrolyte beverages. You may want to stay away from caffeine or alcohol. If you are nauseated, try taking small sips of liquids. How do you know if you are getting enough fluid? Your urine should be a pale yellow or almost colorless. Get rest. Taking a steamy shower or  using a humidifier may help nasal congestion and ease sore throat pain. You can place a towel over your head and breathe in the steam from hot water coming from a faucet. Using a saline nasal spray works much the same way. Cough drops, hard candies and sore throat lozenges may ease your cough. Avoid close contacts especially the very young and the elderly Cover your mouth if you cough or sneeze Always remember to wash your hands.   GET HELP RIGHT AWAY IF: You develop worsening fever. If your symptoms do not improve within 10 days You develop yellow or green discharge from your nose over 3 days. You have coughing fits You develop a severe head ache or visual changes. You develop shortness of breath, difficulty breathing or start having chest pain Your symptoms persist after you have completed your treatment plan  MAKE SURE YOU  Understand these instructions. Will watch your condition. Will get help right away if you are not doing well or get worse.  Thank you for choosing an e-visit.  Your e-visit answers were reviewed by a board certified advanced clinical practitioner to complete your personal care plan. Depending upon the condition, your plan could have included both over the counter or prescription medications.  Please review your pharmacy choice. Make sure the pharmacy is open so you can pick up prescription now. If there is a problem, you may contact your provider through [employer] and have the prescription routed to another pharmacy.  Your safety is important to [place]. If you have drug allergies check your prescription carefully.   For the next 24 hours you can use MyChart to ask questions about today's visit, request a non-urgent call back, or ask for a work or school excuse. You will get an email in the next two days asking about your experience. I hope that your e-visit has been valuable and will speed your recovery.  I provided 5 minutes of non face-to-face time during  this encounter for chart review and documentation.

## 2021-07-10 ENCOUNTER — Other Ambulatory Visit (HOSPITAL_COMMUNITY): Payer: Self-pay

## 2021-08-04 ENCOUNTER — Other Ambulatory Visit (HOSPITAL_COMMUNITY): Payer: Self-pay

## 2021-08-14 ENCOUNTER — Ambulatory Visit (INDEPENDENT_AMBULATORY_CARE_PROVIDER_SITE_OTHER): Payer: No Typology Code available for payment source | Admitting: Nurse Practitioner

## 2021-08-14 ENCOUNTER — Other Ambulatory Visit (HOSPITAL_COMMUNITY): Payer: Self-pay

## 2021-08-14 ENCOUNTER — Encounter: Payer: Self-pay | Admitting: Nurse Practitioner

## 2021-08-14 ENCOUNTER — Other Ambulatory Visit: Payer: Self-pay

## 2021-08-14 VITALS — BP 136/66 | HR 80 | Temp 96.4°F | Ht 62.0 in | Wt 182.8 lb

## 2021-08-14 DIAGNOSIS — R7303 Prediabetes: Secondary | ICD-10-CM | POA: Diagnosis not present

## 2021-08-14 DIAGNOSIS — Z136 Encounter for screening for cardiovascular disorders: Secondary | ICD-10-CM

## 2021-08-14 DIAGNOSIS — E039 Hypothyroidism, unspecified: Secondary | ICD-10-CM | POA: Diagnosis not present

## 2021-08-14 DIAGNOSIS — D500 Iron deficiency anemia secondary to blood loss (chronic): Secondary | ICD-10-CM | POA: Diagnosis not present

## 2021-08-14 DIAGNOSIS — Z96641 Presence of right artificial hip joint: Secondary | ICD-10-CM

## 2021-08-14 DIAGNOSIS — I10 Essential (primary) hypertension: Secondary | ICD-10-CM

## 2021-08-14 DIAGNOSIS — R200 Anesthesia of skin: Secondary | ICD-10-CM

## 2021-08-14 DIAGNOSIS — Z13220 Encounter for screening for lipoid disorders: Secondary | ICD-10-CM

## 2021-08-14 DIAGNOSIS — R202 Paresthesia of skin: Secondary | ICD-10-CM

## 2021-08-14 DIAGNOSIS — M25551 Pain in right hip: Secondary | ICD-10-CM

## 2021-08-14 DIAGNOSIS — G8929 Other chronic pain: Secondary | ICD-10-CM

## 2021-08-14 LAB — HEMOGLOBIN A1C: Hgb A1c MFr Bld: 6.1 % (ref 4.6–6.5)

## 2021-08-14 LAB — MICROALBUMIN / CREATININE URINE RATIO
Creatinine,U: 149.3 mg/dL
Microalb Creat Ratio: 1.8 mg/g (ref 0.0–30.0)
Microalb, Ur: 2.7 mg/dL — ABNORMAL HIGH (ref 0.0–1.9)

## 2021-08-14 LAB — TSH: TSH: 3.65 u[IU]/mL (ref 0.35–5.50)

## 2021-08-14 LAB — T4, FREE: Free T4: 1.28 ng/dL (ref 0.60–1.60)

## 2021-08-14 MED ORDER — CARVEDILOL 3.125 MG PO TABS
3.1250 mg | ORAL_TABLET | Freq: Two times a day (BID) | ORAL | 3 refills | Status: DC
  Filled 2021-08-14 – 2021-09-03 (×2): qty 180, 90d supply, fill #0
  Filled 2021-11-20: qty 180, 90d supply, fill #1
  Filled 2022-02-24: qty 180, 90d supply, fill #2
  Filled 2022-05-25: qty 180, 90d supply, fill #3

## 2021-08-14 MED ORDER — LEVOTHYROXINE SODIUM 50 MCG PO TABS
ORAL_TABLET | Freq: Every day | ORAL | 1 refills | Status: DC
  Filled 2021-08-14: qty 90, 90d supply, fill #0
  Filled 2021-11-20: qty 90, 90d supply, fill #1

## 2021-08-14 MED ORDER — GABAPENTIN 100 MG PO CAPS
100.0000 mg | ORAL_CAPSULE | Freq: Every day | ORAL | 3 refills | Status: DC
  Filled 2021-08-14: qty 90, 90d supply, fill #0
  Filled 2021-11-13: qty 90, 90d supply, fill #1
  Filled 2022-02-24: qty 90, 90d supply, fill #2

## 2021-08-14 NOTE — Assessment & Plan Note (Signed)
BP at goal with coreg BP Readings from Last 3 Encounters:  08/14/21 136/66  04/04/21 130/74  02/27/21 109/72   Maintain med dose Repeat BMP

## 2021-08-14 NOTE — Assessment & Plan Note (Signed)
Repeat cbc and iron panel 

## 2021-08-14 NOTE — Assessment & Plan Note (Addendum)
Repeat hgbA1c and urine microalbumin

## 2021-08-14 NOTE — Progress Notes (Signed)
Subjective:  Patient ID: Shelly Cohen, female    DOB: 05-02-1961  Age: 61 y.o. MRN: [id number]  CC: Follow-up (6 month f/u on HTN and thyroid. )  HPI Reports persistent right hip pain with limited ROM poet arthroplasty. Denies any fall. Pain is worse after weight bearing exercise and laying down at night. Last appt with ortho was 1year ago.  HTN (hypertension), benign BP at goal with coreg BP Readings from Last 3 Encounters:  08/14/21 136/66  04/04/21 130/74  02/27/21 109/72   Maintain med dose Repeat BMP  Hypothyroidism Repeat TSH and T4 Maintain current levothyroxine dose  Iron deficiency anemia Repeat cbc and iron panel  DM (diabetes mellitus) (HCC) Repeat hgbA1c and urine microalbumin  BP Readings from Last 3 Encounters:  08/14/21 136/66  04/04/21 130/74  02/27/21 109/72    Wt Readings from Last 3 Encounters:  08/14/21 182 lb 12.8 oz (82.9 kg)  07/03/21 182 lb 1.6 oz (82.6 kg)  04/20/21 185 lb (83.9 kg)    Reviewed past Medical, Social and Family history today.  Outpatient Medications Prior to Visit  Medication Sig Dispense Refill   benzonatate (TESSALON) 100 MG capsule Take 1 capsule (100 mg total) by mouth 3 (three) times daily as needed. 30 capsule 0   BIOTIN PO Take 1 tablet by mouth daily.     CALCIUM-VITAMIN D PO Take 1 tablet by mouth daily.     COVID-19 mRNA bivalent vaccine, Pfizer, (PFIZER COVID-19 VAC BIVALENT) injection Inject into the muscle. 0.3 mL 0   docusate sodium (COLACE) 100 MG capsule Take 100 mg by mouth every other day.     fluticasone (FLONASE) 50 MCG/ACT nasal spray Place 1 spray into both nostrils daily as needed for allergies or rhinitis.     ipratropium (ATROVENT) 0.03 % nasal spray Place 2 sprays into both nostrils every 12 (twelve) hours. 30 mL 0   KRILL OIL PO Take 1 capsule by mouth daily.     latanoprost (XALATAN) 0.005 % ophthalmic solution Place 1 drop into both eyes every evening 5 mL 6   MELATONIN PO Take 1 tablet by  mouth at bedtime.     pantoprazole (PROTONIX) 40 MG tablet Take 1 tablet (40 mg total) by mouth daily. 90 tablet 0   promethazine-dextromethorphan (PROMETHAZINE-DM) 6.25-15 MG/5ML syrup Take 5 mLs by mouth 4 (four) times daily as needed. 118 mL 0   carvedilol (COREG) 3.125 MG tablet Take 1 tablet (3.125 mg total) by mouth 2 (two) times daily. 60 tablet 5   gabapentin (NEURONTIN) 100 MG capsule Take 1 capsule (100 mg total) by mouth at bedtime. 30 capsule 5   levothyroxine (SYNTHROID) 50 MCG tablet TAKE 1 TABLET BY MOUTH DAILY BEFORE BREAKFAST 90 tablet 1   No facility-administered medications prior to visit.    ROS See HPI  Objective:  BP 136/66 (BP Location: Left Arm, Patient Position: Sitting, Cuff Size: Normal)    Pulse 80    Temp (!) 96.4 F (35.8 C) (Temporal)    Ht 5\' 2"  (1.575 m)    Wt 182 lb 12.8 oz (82.9 kg)    SpO2 98%    BMI 33.43 kg/m   Physical Exam Cardiovascular:     Rate and Rhythm: Normal rate and regular rhythm.     Pulses: Normal pulses.     Heart sounds: Normal heart sounds.  Pulmonary:     Effort: Pulmonary effort is normal.     Breath sounds: Normal breath sounds.  Musculoskeletal:  General: Tenderness present.     Right hip: Tenderness present. No crepitus. Decreased range of motion. Normal strength.     Left hip: Tenderness present. No crepitus. Normal range of motion. Normal strength.     Right upper leg: Normal.     Left upper leg: Normal.     Right knee: Normal.     Left knee: Normal.     Right lower leg: Normal. No edema.     Left lower leg: Normal. No edema.     Comments: Right groin and lateral hip tenderness, No inguinal lymphadenopathy  Neurological:     Mental Status: She is alert and oriented to person, place, and time.   Assessment & Plan:  This visit occurred during the SARS-CoV-2 public health emergency.  Safety protocols were in place, including screening questions prior to the visit, additional usage of staff PPE, and extensive  cleaning of exam room while observing appropriate contact time as indicated for disinfecting solutions.   Shelly Cohen was seen today for follow-up.  Diagnoses and all orders for this visit:  HTN (hypertension), benign -     Basic metabolic panel -     carvedilol (COREG) 3.125 MG tablet; Take 1 tablet (3.125 mg total) by mouth 2 (two) times daily.  Hypothyroidism, unspecified type -     T4, free -     TSH -     levothyroxine (SYNTHROID) 50 MCG tablet; TAKE 1 TABLET BY MOUTH DAILY BEFORE BREAKFAST  Prediabetes -     Hemoglobin A1c -     Microalbumin / creatinine urine ratio  Iron deficiency anemia due to chronic blood loss -     IBC + Ferritin  Encounter for lipid screening for cardiovascular disease -     Lipid panel  Chronic hip pain after total replacement of right hip joint  Numbness and tingling of foot -     gabapentin (NEURONTIN) 100 MG capsule; Take 1 capsule (100 mg total) by mouth at bedtime.  Please schedule appt with ortho to re eval right hip. Maintain daily hip range of motion exercise, then apply cold compress.  Problem List Items Addressed This Visit       Cardiovascular and Mediastinum   HTN (hypertension), benign - Primary    BP at goal with coreg BP Readings from Last 3 Encounters:  08/14/21 136/66  04/04/21 130/74  02/27/21 109/72   Maintain med dose Repeat BMP      Relevant Medications   carvedilol (COREG) 3.125 MG tablet   Other Relevant Orders   Basic metabolic panel     Endocrine   DM (diabetes mellitus) (HCC)    Repeat hgbA1c and urine microalbumin      Hypothyroidism    Repeat TSH and T4 Maintain current levothyroxine dose      Relevant Medications   levothyroxine (SYNTHROID) 50 MCG tablet   carvedilol (COREG) 3.125 MG tablet   Other Relevant Orders   T4, free   TSH     Other   Chronic hip pain after total replacement of right hip joint   Relevant Medications   gabapentin (NEURONTIN) 100 MG capsule   Iron deficiency anemia     Repeat cbc and iron panel      Relevant Orders   IBC + Ferritin   Numbness and tingling of foot   Relevant Medications   gabapentin (NEURONTIN) 100 MG capsule   Other Visit Diagnoses     Encounter for lipid screening for cardiovascular disease  Relevant Orders   Lipid panel       Follow-up: Return in about 6 months (around 02/11/2022) for CPE (fasting).  Wilfred Lacy, NP

## 2021-08-14 NOTE — Patient Instructions (Signed)
Please schedule appt with ortho to re eval right hip. Maintain daily hip range of motion exercise, then apply cold compress.  Go to lab for blood draw and urine collection.  Hip Exercises Ask your health care provider which exercises are safe for you. Do exercises exactly as told by your health care provider and adjust them as directed. It is normal to feel mild stretching, pulling, tightness, or discomfort as you do these exercises. Stop right away if you feel sudden pain or your pain gets worse. Do not begin these exercises until told by your health care provider. Stretching and range-of-motion exercises These exercises warm up your muscles and joints and improve the movement and flexibility of your hip. These exercises also help to relieve pain, numbness, and tingling. You may be asked to limit your range of motion if you had a hip replacement. Talk to your health care provider about these restrictions. Hamstrings, supine  Lie on your back (supine position). Loop a belt or towel over the ball of your left / right foot. The ball of your foot is on the walking surface, right under your toes. Straighten your left / right knee and slowly pull on the belt or towel to raise your leg until you feel a gentle stretch behind your knee (hamstring). Do not let your knee bend while you do this. Keep your other leg flat on the floor. Hold this position for __________ seconds. Slowly return your leg to the starting position. Repeat __________ times. Complete this exercise __________ times a day. Hip rotation  Lie on your back on a firm surface. With your left / right hand, gently pull your left / right knee toward the shoulder that is on the same side of the body. Stop when your knee is pointing toward the ceiling. Hold your left / right ankle with your other hand. Keeping your knee steady, gently pull your left / right ankle toward your other shoulder until you feel a stretch in your buttocks. Keep  your hips and shoulders firmly planted while you do this stretch. Hold this position for __________ seconds. Repeat __________ times. Complete this exercise __________ times a day. Seated stretch This exercise is sometimes called hamstrings and adductors stretch. Sit on the floor with your legs stretched wide. Keep your knees straight during this exercise. Keeping your head and back in a straight line, bend at your waist to reach for your left foot (position A). You should feel a stretch in your right inner thigh (adductors). Hold this position for __________ seconds. Then slowly return to the upright position. Keeping your head and back in a straight line, bend at your waist to reach forward (position B). You should feel a stretch behind both of your thighs and knees (hamstrings). Hold this position for __________ seconds. Then slowly return to the upright position. Keeping your head and back in a straight line, bend at your waist to reach for your right foot (position C). You should feel a stretch in your left inner thigh (adductors). Hold this position for __________ seconds. Then slowly return to the upright position. Repeat __________ times. Complete this exercise __________ times a day. Lunge This exercise stretches the muscles of the hip (hip flexors). Place your left / right knee on the floor and bend your other knee so that is directly over your ankle. You should be half-kneeling. Keep good posture with your head over your shoulders. Tighten your buttocks to point your tailbone downward. This will prevent your back  from arching too much. You should feel a gentle stretch in the front of your left / right thigh and hip. If you do not feel a stretch, slide your other foot forward slightly and then slowly lunge forward with your chest up until your knee once again lines up over your ankle. Make sure your tailbone continues to point downward. Hold this position for __________ seconds. Slowly  return to the starting position. Repeat __________ times. Complete this exercise __________ times a day. Strengthening exercises These exercises build strength and endurance in your hip. Endurance is the ability to use your muscles for a long time, even after they get tired. Bridge This exercise strengthens the muscles of your hip (hip extensors). Lie on your back on a firm surface with your knees bent and your feet flat on the floor. Tighten your buttocks muscles and lift your bottom off the floor until the trunk of your body and your hips are level with your thighs. Do not arch your back. You should feel the muscles working in your buttocks and the back of your thighs. If you do not feel these muscles, slide your feet 1-2 inches (2.5-5 cm) farther away from your buttocks. Hold this position for __________ seconds. Slowly lower your hips to the starting position. Let your muscles relax completely between repetitions. Repeat __________ times. Complete this exercise __________ times a day. Straight leg raises, side-lying This exercise strengthens the muscles that move the hip joint away from the center of the body (hip abductors). Lie on your side with your left / right leg in the top position. Lie so your head, shoulder, hip, and knee line up. You may bend your bottom knee slightly to help you balance. Roll your hips slightly forward, so your hips are stacked directly over each other and your left / right knee is facing forward. Leading with your heel, lift your top leg 4-6 inches (10-15 cm). You should feel the muscles in your top hip lifting. Do not let your foot drift forward. Do not let your knee roll toward the ceiling. Hold this position for __________ seconds. Slowly return to the starting position. Let your muscles relax completely between repetitions. Repeat __________ times. Complete this exercise __________ times a day. Straight leg raises, side-lying This exercise strengthens  the muscles that move the hip joint toward the center of the body (hip adductors). Lie on your side with your left / right leg in the bottom position. Lie so your head, shoulder, hip, and knee line up. You may place your upper foot in front to help you balance. Roll your hips slightly forward, so your hips are stacked directly over each other and your left / right knee is facing forward. Tense the muscles in your inner thigh and lift your bottom leg 4-6 inches (10-15 cm). Hold this position for __________ seconds. Slowly return to the starting position. Let your muscles relax completely between repetitions. Repeat __________ times. Complete this exercise __________ times a day. Straight leg raises, supine This exercise strengthens the muscles in the front of your thigh (quadriceps). Lie on your back (supine position) with your left / right leg extended and your other knee bent. Tense the muscles in the front of your left / right thigh. You should see your kneecap slide up or see increased dimpling just above your knee. Keep these muscles tight as you raise your leg 4-6 inches (10-15 cm) off the floor. Do not let your knee bend. Hold this position for __________  seconds. Keep these muscles tense as you lower your leg. Relax the muscles slowly and completely between repetitions. Repeat __________ times. Complete this exercise __________ times a day. Hip abductors, standing This exercise strengthens the muscles that move the leg and hip joint away from the center of the body (hip abductors). Tie one end of a rubber exercise band or tubing to a secure surface, such as a chair, table, or pole. Loop the other end of the band or tubing around your left / right ankle. Keeping your ankle with the band or tubing directly opposite the secured end, step away until there is tension in the tubing or band. Hold on to a chair, table, or pole as needed for balance. Lift your left / right leg out to your side.  While you do this: Keep your back upright. Keep your shoulders over your hips. Keep your toes pointing forward. Make sure to use your hip muscles to slowly lift your leg. Do not tip your body or forcefully lift your leg. Hold this position for __________ seconds. Slowly return to the starting position. Repeat __________ times. Complete this exercise __________ times a day. Squats This exercise strengthens the muscles in the front of your thigh (quadriceps). Stand in a door frame so your feet and knees are in line with the frame. You may place your hands on the frame for balance. Slowly bend your knees and lower your hips like you are going to sit in a chair. Keep your lower legs in a straight-up-and-down position. Do not let your hips go lower than your knees. Do not bend your knees lower than told by your health care provider. If your hip pain increases, do not bend as low. Hold this position for ___________ seconds. Slowly push with your legs to return to standing. Do not use your hands to pull yourself to standing. Repeat __________ times. Complete this exercise __________ times a day. This information is not intended to replace advice given to you by your health care provider. Make sure you discuss any questions you have with your health care provider. Document Revised: 11/02/2020 Document Reviewed: 11/02/2020 Elsevier Patient Education  [place].

## 2021-08-14 NOTE — Assessment & Plan Note (Signed)
Repeat TSH and T4 Maintain current levothyroxine dose

## 2021-08-15 LAB — LIPID PANEL
Cholesterol: 173 mg/dL (ref 0–200)
HDL: 66.8 mg/dL (ref >39.00)
LDL Cholesterol: 93 mg/dL (ref 0–99)
NonHDL: 106.66
Total CHOL/HDL Ratio: 3
Triglycerides: 69 mg/dL (ref 0.0–149.0)
VLDL: 13.8 mg/dL (ref 0.0–40.0)

## 2021-08-15 LAB — IBC + FERRITIN
Ferritin: 76.3 ng/mL (ref 10.0–291.0)
Iron: 63 ug/dL (ref 42–145)
Saturation Ratios: 19.1 % — ABNORMAL LOW (ref 20.0–50.0)
TIBC: 329 ug/dL (ref 250.0–450.0)
Transferrin: 235 mg/dL (ref 212.0–360.0)

## 2021-08-15 LAB — BASIC METABOLIC PANEL
BUN: 16 mg/dL (ref 6–23)
CO2: 32 mEq/L (ref 19–32)
Calcium: 9.9 mg/dL (ref 8.4–10.5)
Chloride: 106 mEq/L (ref 96–112)
Creatinine, Ser: 0.84 mg/dL (ref 0.40–1.20)
GFR: 75.67 mL/min (ref >60.00)
Glucose, Bld: 87 mg/dL (ref 70–99)
Potassium: 4.1 mEq/L (ref 3.5–5.1)
Sodium: 142 mEq/L (ref 135–145)

## 2021-09-04 ENCOUNTER — Other Ambulatory Visit (HOSPITAL_COMMUNITY): Payer: Self-pay

## 2021-09-07 ENCOUNTER — Other Ambulatory Visit: Payer: Self-pay

## 2021-09-07 ENCOUNTER — Encounter: Payer: No Typology Code available for payment source | Attending: General Surgery | Admitting: Skilled Nursing Facility1

## 2021-09-07 ENCOUNTER — Other Ambulatory Visit (HOSPITAL_COMMUNITY): Payer: Self-pay

## 2021-09-07 DIAGNOSIS — E119 Type 2 diabetes mellitus without complications: Secondary | ICD-10-CM | POA: Diagnosis not present

## 2021-09-07 MED ORDER — OZEMPIC (0.25 OR 0.5 MG/DOSE) 2 MG/3ML ~~LOC~~ SOPN
PEN_INJECTOR | SUBCUTANEOUS | 0 refills | Status: DC
Start: 2021-09-07 — End: 2022-01-23
  Filled 2021-09-07: qty 3, 49d supply, fill #0

## 2021-09-07 NOTE — Progress Notes (Signed)
Bariatric Nutrition Follow-Up Visit ?Medical Nutrition Therapy  ? ? ?Sleeve gastrectomy 02/20/2021 ? ?NUTRITION ASSESSMENT ?  ? ?  ?Anthropometrics  ?Start weight at [facility]: 219.2 lbs (date: 09/21/2020)  ?Weight: 180.3 pounds ?  ?Clinical  ?Medical hx: HTN, DM ?Medications: see list ?Labs: A1C 6.1 ?Notable signs/symptoms: stiff hip ?Any previous deficiencies? No ? ? ?Body Composition Scale 03/07/2021 04/20/2021 07/03/2021 09/07/2021  ?Current Body Weight 190 185 182.1 180.3  ?Total Body Fat % 42 40.6 40.4 40.1  ?Visceral Fat '13 13 13 12  '$ ?Fat-Free Mass % 57.9 59.3 59.5 59.8  ? Total Body Water % 43.4 44.1 44.2 44.4  ?Muscle-Mass lbs 27.1 27.7 27.5 27.4  ?BMI 34.5 33.6 33.1 32.8  ?Body Fat Displacement      ?       Torso  lbs 49.3 46.5 45.5 44.7  ?       Left Leg  lbs 9.8 9.3 9.1 8.9  ?       Right Leg  lbs 9.8 9.3 9.1 8.9  ?       Left Arm  lbs 4.9 4.6 4.5 4.4  ?       Right Arm   lbs 4.9 4.6 4.5 4.4  ? ?  ?Lifestyle & Dietary Hx ? ?Pt states she does not check her blood sugars.  ? 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  ?Pt states since adding in more carbohydrates she is not as exhausted and weak feeling after her workouts. ?Pt states she uses jars that are a certain size for portion control.  ? ?Estimated daily fluid intake: 64+ oz ?Estimated daily protein intake: 80+ g ?Supplements: multi and calcium ?Current average weekly physical activity: walking 3 days a week 60 minutes, treadmill at home; 10 pound weights (increased from 5 pounds) ? ?24-Hr Dietary Recall ?First Meal: protein shake ?Snack:  egg + cheese + wheat toast ?Second Meal 12:  greek yogurt + granola + fruit  ?Snack: or cracker and hummus  ?Third Meal: salmon or other fish + non starchy vegetables or beans or chicken + mixed vegetables  ?Snack: fruit or no sugar added applesauce ?Beverages: water, water + flavoring,  24 ounces fat free milk ? ?Post-Op Goals/ Signs/ Symptoms ?Using straws: no ?Drinking while eating: no ?Chewing/swallowing difficulties: no ?Changes in vision: no ?Changes to mood/headaches: no ?Hair loss/changes to skin/nails: no ?Difficulty focusing/concentrating: no ?Sweating: no ?Limb weakness: no ?Dizziness/lightheadedness: no ?Palpitations: no  ?Carbonated/caffeinated beverages: no ?N/V/D/C/Gas: no ?Abdominal pain: no ?Dumping syndrome: no ? ?  ?NUTRITION DIAGNOSIS  ?Overweight/obesity ([place]-3.3) related to past poor dietary habits and physical inactivity as evidenced by completed bariatric surgery and following dietary guidelines for continued weight loss and healthy nutrition status. ?  ?  ?NUTRITION INTERVENTION: continued ?Nutrition counseling (C-1) and education (E-2) to facilitate bariatric surgery goals, including: ? ?The importance of consuming adequate calories as well as certain nutrients daily due to the body's need for essential vitamins, minerals, and fats ?The importance of daily physical activity and to reach a goal of at least 150 minutes of moderate to vigorous physical activity weekly (or as directed by their physician) due to benefits such as increased musculature and improved lab values ?The importance of intuitive eating specifically learning hunger-satiety cues and understanding the importance of learning a new body: The importance of mindful eating to avoid grazing behaviors  ?  Encouraged patient to honor their body's internal hunger and fullness cues.  Throughout the day, check in mentally and rate hunger. Stop eating when satisfied not full regardless of how much food is left on the plate.  Get more if still hungry 20-30 minutes later.  The key is to honor satisfaction so throughout the meal, rate fullness factor and stop when comfortably satisfied not physically full. The key is to honor hunger and fullness without any feelings of guilt or shame.  Pay attention to what the internal cues  are, rather than any external factors. This will enhance the confidence you have in listening to your own body and following those internal cues enabling you to increase how often you eat when you are hungry not out of appetite and stop when you are satisfied not full.  ?Encouraged pt to continue to eat balanced meals inclusive of non starchy vegetables 2 times a day 7 days a week ?Encouraged pt to choose lean protein sources: limiting beef, pork, sausage, hotdogs, and lunch meat ?Encourage pt to choose healthy fats such as plant based limiting animal fats ?Encouraged pt to continue to drink a minium 64 fluid ounces with half being plain water to satisfy proper hydration  ? ?Handouts Provided Include  ?Phase 7 ? ?Learning Style & Readiness for Change ?Teaching method utilized: Visual & Auditory  ?Demonstrated degree of understanding via: Teach Back  ?Readiness Level: action ?Barriers to learning/adherence to lifestyle change: none identified  ? ?RD's Notes for Next Visit ?Assess adherence to pt chosen goals  ? ? ?MONITORING & EVALUATION ?Dietary intake, weekly physical activity, body weight ? ?Next Steps ?Patient is to follow-up in August  ? ?

## 2021-09-11 ENCOUNTER — Other Ambulatory Visit (HOSPITAL_COMMUNITY): Payer: Self-pay

## 2021-09-11 ENCOUNTER — Ambulatory Visit (INDEPENDENT_AMBULATORY_CARE_PROVIDER_SITE_OTHER): Payer: No Typology Code available for payment source | Admitting: Cardiology

## 2021-09-11 ENCOUNTER — Encounter: Payer: Self-pay | Admitting: Cardiology

## 2021-09-11 ENCOUNTER — Other Ambulatory Visit: Payer: Self-pay

## 2021-09-11 VITALS — BP 150/80 | HR 73 | Ht 62.0 in | Wt 182.0 lb

## 2021-09-11 DIAGNOSIS — E669 Obesity, unspecified: Secondary | ICD-10-CM | POA: Diagnosis not present

## 2021-09-11 DIAGNOSIS — I10 Essential (primary) hypertension: Secondary | ICD-10-CM

## 2021-09-11 DIAGNOSIS — G4733 Obstructive sleep apnea (adult) (pediatric): Secondary | ICD-10-CM

## 2021-09-11 NOTE — Progress Notes (Signed)
?Cardiology Office Note:   ? ?Date:  09/11/2021  ? ?ID:  Shelly Cohen, DOB [birth year], MRN [id number] ? ?PCP:  Flossie Buffy, NP  ?Cardiologist:  Fransico Him, MD   ? ?Referring MD: Flossie Buffy, NP  ? ?Chief Complaint  ?Patient presents with  ? Hypertension  ? Sleep Apnea  ? ? ?History of Present Illness:   ? ?Shelly Cohen is a 61 y.o. female with a hx of poorly controlled HTN and thyroid disease.  She had been on Lisinopril '20mg'$  daily, amlodipine 2.'5mg'$  daily and Toprol XL '100mg'$  daily.  She did not tolerate diuretics in the past due to hypokalemia.  Renal artery duplex was normal. Aldosterone and Aldo PRA ratio were normal.  Plasma catecholamine levels were normal but no urine catecholamines were done.  It was felt that she had essential hypertension.  2D echo showed normal LVF with EF 60-65% with mild LVH.  She was in the ER 08/17/2020 with atypical chest pain that was worse with movement and inspiration and nonradiating and nonexertional and dx with MSK chest wall pain and resolved on Voltaren.  ? ?She is here today for followup and is doing well.  She denies any chest pain or pressure, SOB, DOE, PND, orthopnea, LE edema, dizziness, palpitations or syncope. She is compliant with her meds and is tolerating meds with no SE.    ?Past Medical History:  ?Diagnosis Date  ? Alpha thalassemia trait   ? Arthritis   ? hips  ? Fibroadenoma   ? Glaucoma   ? monitored every 44month, right eye worse than left   ? History of positive PPD, treatment status unknown   ? Hypertension   ? OSA (obstructive sleep apnea)   ? mild with AHI 8/hr  ? Pre-diabetes   ? Thyroid disease   ? ? ?Past Surgical History:  ?Procedure Laterality Date  ? ABDOMINAL HYSTERECTOMY  2013  ? BREAST BIOPSY  2013  ? BUNIONECTOMY  10/2014  ? CHOLECYSTECTOMY  2015  ? COLONOSCOPY    ? hallux limi Right 2019  ? great  toe revision  ? TOTAL HIP ARTHROPLASTY Right 09/12/2018  ? Procedure: RIGHT TOTAL HIP ARTHROPLASTY ANTERIOR APPROACH;   Surgeon: BMcarthur Rossetti MD;  Location: [facility];  Service: Orthopedics;  Laterality: Right;  ? TUBAL LIGATION  1990  ? UPPER GI ENDOSCOPY N/A 02/20/2021  ? Procedure: UPPER GI ENDOSCOPY;  Surgeon: WGreer Pickerel MD;  Location: [facility];  Service: General;  Laterality: N/A;  ? ? ?Current Medications: ?Current Meds  ?Medication Sig  ? BIOTIN PO Take 1 tablet by mouth daily.  ? CALCIUM-VITAMIN D PO Take 1 tablet by mouth daily.  ? carvedilol (COREG) 3.125 MG tablet Take 1 tablet by mouth 2 times daily.  ? COVID-19 mRNA bivalent vaccine, Pfizer, (PFIZER COVID-19 VAC BIVALENT) injection Inject into the muscle.  ? docusate sodium (COLACE) 100 MG capsule Take 100 mg by mouth every other day.  ? fluticasone (FLONASE) 50 MCG/ACT nasal spray Place 1 spray into both nostrils daily as needed for allergies or rhinitis.  ? gabapentin (NEURONTIN) 100 MG capsule Take 1 capsule (100 mg total) by mouth at bedtime.  ? KRILL OIL PO Take 1 capsule by mouth daily.  ? latanoprost (XALATAN) 0.005 % ophthalmic solution Place 1 drop into both eyes every evening  ? levothyroxine (SYNTHROID) 50 MCG tablet TAKE 1 TABLET BY MOUTH DAILY BEFORE BREAKFAST  ? MELATONIN PO Take 1 tablet by mouth at bedtime.  ? Semaglutide,0.25 or 0.'5MG'$ /DOS, (  OZEMPIC, 0.25 OR 0.5 MG/DOSE,) 2 MG/1.5ML SOPN Inject 0.25 mg total into the skin once a week for 42 days then 0.'5mg'$  once a week for 2 weeks  ?  ? ?Allergies:   Dilaudid [hydromorphone hcl], Diphenhydramine hcl, Hydrocodone, Oxycodone, Azithromycin, Cephalexin, Penicillin g, and Zolpidem  ? ?Social History  ? ?Socioeconomic History  ? Marital status: Married  ?  Spouse name: Not on file  ? Number of children: Not on file  ? Years of education: Not on file  ? Highest education level: Not on file  ?Occupational History  ? Not on file  ?Tobacco Use  ? Smoking status: Never  ? Smokeless tobacco: Never  ?Vaping Use  ? Vaping Use: Never used  ?Substance and Sexual Activity  ? Alcohol use: No  ? Drug use: No  ?  Sexual activity: Yes  ?  Birth control/protection: Surgical  ?Other Topics Concern  ? Not on file  ?Social History Narrative  ? Not on file  ? ?Social Determinants of Health  ? ?Financial Resource Strain: Not on file  ?Food Insecurity: Not on file  ?Transportation Needs: Not on file  ?Physical Activity: Not on file  ?Stress: Not on file  ?Social Connections: Not on file  ?  ? ?Family History: ?The patient's family history includes Anemia in her mother; Arthritis in her mother; Cancer in her maternal grandfather and maternal grandmother; Cataracts in her mother and sister; Diabetes in her brother; Glaucoma in her brother and maternal aunt; Heart disease in her mother; Hypertension in her brother, mother, and sister; Kidney disease in her mother. ? ?ROS:   ?Please see the history of present illness.    ?ROS  ?All other systems reviewed and negative.  ? ?EKGs/Labs/Other Studies Reviewed:   ? ?The following studies were reviewed today: ?2D Echo ? ?EKG:  EKG is not ordered today.  ? ?Recent Labs: ?02/21/2021: ALT 17 ?02/27/2021: Hemoglobin 11.6; Platelets 266.0 ?08/14/2021: BUN 16; Creatinine, Ser 0.84; Potassium 4.1; Sodium 142; TSH 3.65  ? ?Recent Lipid Panel ?   ?Component Value Date/Time  ? CHOL 173 08/14/2021 0832  ? TRIG 69.0 08/14/2021 0832  ? HDL 66.80 08/14/2021 0832  ? CHOLHDL 3 08/14/2021 0832  ? VLDL 13.8 08/14/2021 0832  ? [place] 93 08/14/2021 0832  ? ? ?Physical Exam:   ? ?VS:  BP (!) 150/80   Pulse 73   Ht '5\' 2"'$  (1.575 m)   Wt 182 lb (82.6 kg)   SpO2 99%   BMI 33.29 kg/m?    ? ?Wt Readings from Last 3 Encounters:  ?09/11/21 182 lb (82.6 kg)  ?09/07/21 180 lb 4.8 oz (81.8 kg)  ?08/14/21 182 lb 12.8 oz (82.9 kg)  ?  ? ?GEN: Well nourished, well developed in no acute distress ?HEENT: Normal ?NECK: No JVD; No carotid bruits ?LYMPHATICS: No lymphadenopathy ?CARDIAC:RRR, no murmurs, rubs, gallops ?RESPIRATORY:  Clear to auscultation without rales, wheezing or rhonchi  ?ABDOMEN: Soft, non-tender,  non-distended ?MUSCULOSKELETAL:  No edema; No deformity  ?SKIN: Warm and dry ?NEUROLOGIC:  Alert and oriented x 3 ?PSYCHIATRIC:  Normal affect   ?ASSESSMENT:   ? ?1. HTN (hypertension), benign   ?2. Obesity (BMI 30-39.9)   ?3. OSA (obstructive sleep apnea)   ? ?PLAN:   ? ?In order of problems listed above: ? ?1.  HTN ?-BP is borderline controlled on exam today>>she has white coat HTN and at home runs 120/70's ?-Continue prescription drug management with carvedilol 3.125 mg twice daily ?-home sleep study was done  showing mild OSA with an AHI of 8/hr but she chose not to proceed with treatment ? ?2.  Obesity ?-I have encouraged her to get into a routine exercise program and cut back on carbs and portions.  ? ?4.  OSA ?-mild by HST with AHI 8/hr ?-she got the gastric bypass done and has lost almost 40lbs.  She is exercising 3 times weekly at the gym ? ?I think she is doing well and can followup with me on a PRN basis and have her PCP follow HTN ? ? ?Medication Adjustments/Labs and Tests Ordered: ?Current medicines are reviewed at length with the patient today.  Concerns regarding medicines are outlined above.  ?Orders Placed This Encounter  ?Procedures  ? EKG 12-Lead  ? ?No orders of the defined types were placed in this encounter. ? ? ?Signed, ?Fransico Him, MD  ?09/11/2021 8:33 AM    ?[place] ?

## 2021-09-11 NOTE — Patient Instructions (Signed)
Medication Instructions:  Your physician recommends that you continue on your current medications as directed. Please refer to the Current Medication list given to you today.  *If you need a refill on your cardiac medications before your next appointment, please call your pharmacy*  Follow-Up: At [facility], you and your health needs are our priority.  As part of our continuing mission to provide you with exceptional heart care, we have created designated Provider Care Teams.  These Care Teams include your primary Cardiologist (physician) and Advanced Practice Providers (APPs -  Physician Assistants and Nurse Practitioners) who all work together to provide you with the care you need, when you need it.  Follow up with Dr. Turner as needed 

## 2021-09-13 ENCOUNTER — Encounter: Payer: Self-pay | Admitting: Nurse Practitioner

## 2021-09-20 ENCOUNTER — Other Ambulatory Visit (HOSPITAL_COMMUNITY): Payer: Self-pay

## 2021-09-21 ENCOUNTER — Other Ambulatory Visit (HOSPITAL_COMMUNITY): Payer: Self-pay

## 2021-09-21 MED ORDER — PANTOPRAZOLE SODIUM 40 MG PO TBEC
DELAYED_RELEASE_TABLET | ORAL | 0 refills | Status: DC
  Filled 2021-09-21: qty 90, 90d supply, fill #0

## 2021-09-23 ENCOUNTER — Other Ambulatory Visit (HOSPITAL_COMMUNITY): Payer: Self-pay

## 2021-09-28 ENCOUNTER — Other Ambulatory Visit (HOSPITAL_COMMUNITY): Payer: Self-pay

## 2021-09-29 ENCOUNTER — Other Ambulatory Visit (HOSPITAL_COMMUNITY): Payer: Self-pay

## 2021-09-30 ENCOUNTER — Other Ambulatory Visit (HOSPITAL_COMMUNITY): Payer: Self-pay

## 2021-10-02 ENCOUNTER — Other Ambulatory Visit (HOSPITAL_COMMUNITY): Payer: Self-pay

## 2021-10-04 ENCOUNTER — Other Ambulatory Visit (HOSPITAL_COMMUNITY): Payer: Self-pay

## 2021-10-05 ENCOUNTER — Other Ambulatory Visit (HOSPITAL_COMMUNITY): Payer: Self-pay

## 2021-10-06 ENCOUNTER — Other Ambulatory Visit (HOSPITAL_COMMUNITY): Payer: Self-pay

## 2021-10-16 ENCOUNTER — Other Ambulatory Visit (HOSPITAL_COMMUNITY): Payer: Self-pay

## 2021-10-16 MED ORDER — SEMAGLUTIDE-WEIGHT MANAGEMENT 0.25 MG/0.5ML ~~LOC~~ SOAJ
SUBCUTANEOUS | 0 refills | Status: DC
  Filled 2021-10-16 – 2021-11-03 (×2): qty 2, 28d supply, fill #0

## 2021-10-20 ENCOUNTER — Other Ambulatory Visit (HOSPITAL_COMMUNITY): Payer: Self-pay

## 2021-11-01 ENCOUNTER — Other Ambulatory Visit: Payer: Self-pay | Admitting: Nurse Practitioner

## 2021-11-01 DIAGNOSIS — Z1231 Encounter for screening mammogram for malignant neoplasm of breast: Secondary | ICD-10-CM

## 2021-11-03 ENCOUNTER — Other Ambulatory Visit (HOSPITAL_COMMUNITY): Payer: Self-pay

## 2021-11-08 LAB — HM DIABETES EYE EXAM

## 2021-11-14 ENCOUNTER — Other Ambulatory Visit (HOSPITAL_COMMUNITY): Payer: Self-pay

## 2021-11-20 ENCOUNTER — Other Ambulatory Visit (HOSPITAL_COMMUNITY): Payer: Self-pay

## 2021-11-25 ENCOUNTER — Other Ambulatory Visit (HOSPITAL_COMMUNITY): Payer: Self-pay

## 2021-11-28 ENCOUNTER — Other Ambulatory Visit (HOSPITAL_COMMUNITY): Payer: Self-pay

## 2021-11-28 MED ORDER — WEGOVY 0.5 MG/0.5ML ~~LOC~~ SOAJ
SUBCUTANEOUS | 0 refills | Status: DC
  Filled 2021-11-28: qty 2, 28d supply, fill #0

## 2021-11-30 ENCOUNTER — Other Ambulatory Visit (HOSPITAL_COMMUNITY): Payer: Self-pay

## 2021-12-04 ENCOUNTER — Ambulatory Visit
Admission: RE | Admit: 2021-12-04 | Discharge: 2021-12-04 | Disposition: A | Discharge status: Home / Self Care | Payer: No Typology Code available for payment source | Source: Ambulatory Visit | Attending: Nurse Practitioner | Admitting: Nurse Practitioner

## 2021-12-04 DIAGNOSIS — Z1231 Encounter for screening mammogram for malignant neoplasm of breast: Secondary | ICD-10-CM

## 2021-12-06 ENCOUNTER — Other Ambulatory Visit: Payer: Self-pay | Admitting: Nurse Practitioner

## 2021-12-06 ENCOUNTER — Ambulatory Visit
Admission: RE | Admit: 2021-12-06 | Discharge: 2021-12-06 | Disposition: A | Discharge status: Home / Self Care | Payer: No Typology Code available for payment source | Source: Ambulatory Visit | Attending: Nurse Practitioner | Admitting: Nurse Practitioner

## 2021-12-06 ENCOUNTER — Telehealth: Payer: Self-pay | Admitting: Nurse Practitioner

## 2021-12-06 ENCOUNTER — Ambulatory Visit: Payer: No Typology Code available for payment source

## 2021-12-06 DIAGNOSIS — R928 Other abnormal and inconclusive findings on diagnostic imaging of breast: Secondary | ICD-10-CM

## 2021-12-06 NOTE — Telephone Encounter (Signed)
Pt need a co-sign on the diagnostic mammogram paperwork. Pt stated can you sign this before 2:00 today

## 2021-12-15 ENCOUNTER — Other Ambulatory Visit (HOSPITAL_COMMUNITY): Payer: Self-pay

## 2021-12-18 ENCOUNTER — Other Ambulatory Visit (HOSPITAL_COMMUNITY): Payer: Self-pay

## 2021-12-18 IMAGING — CR DG CHEST 2V
2 series · 2 of 2 positions shown · non-contrast
Comparison: Chest radiographs and CT 08/17/2020.

CLINICAL DATA: Pre-op respiratory examination for planned bariatric
surgery.

EXAM:
CHEST - 2 VIEW

[w chest pa]
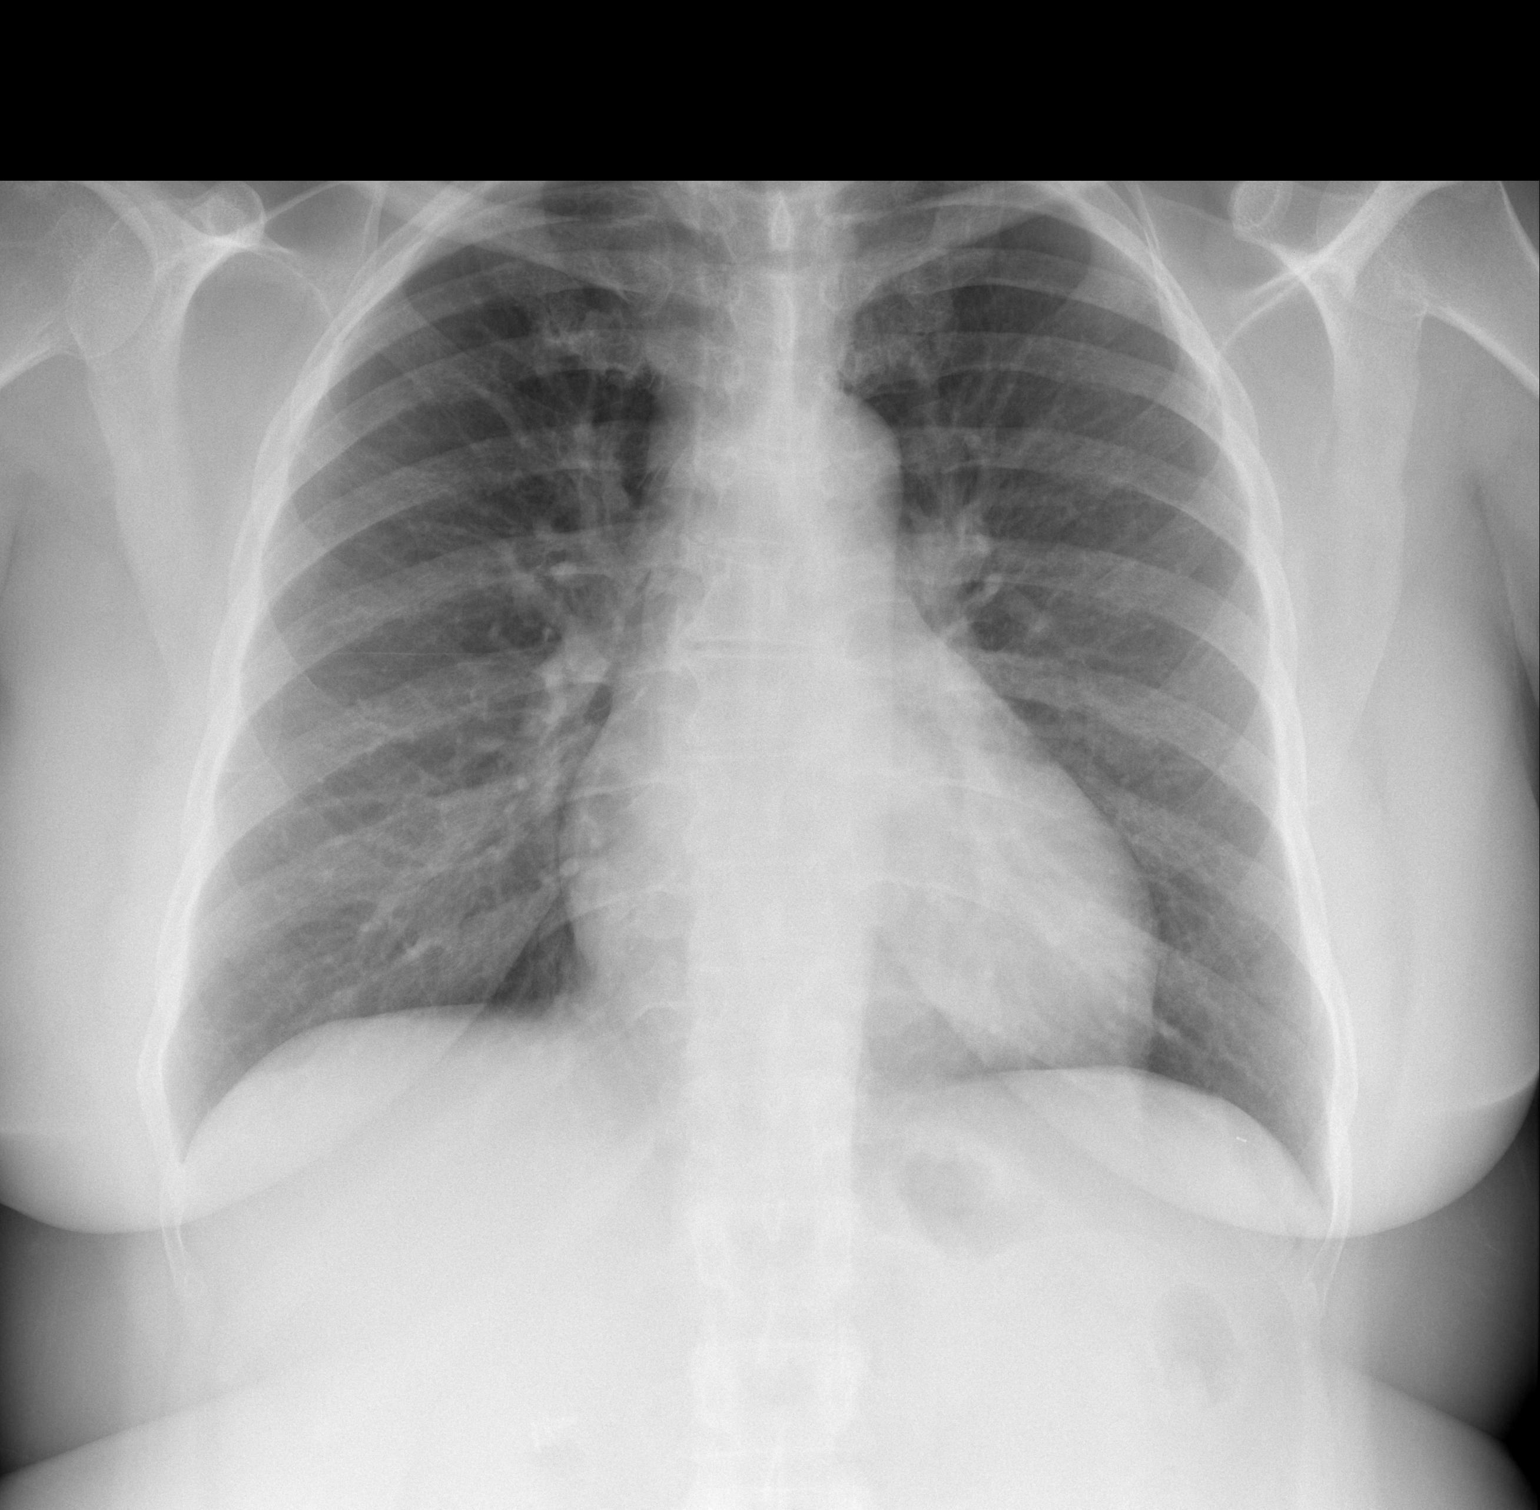

[w chest lat]
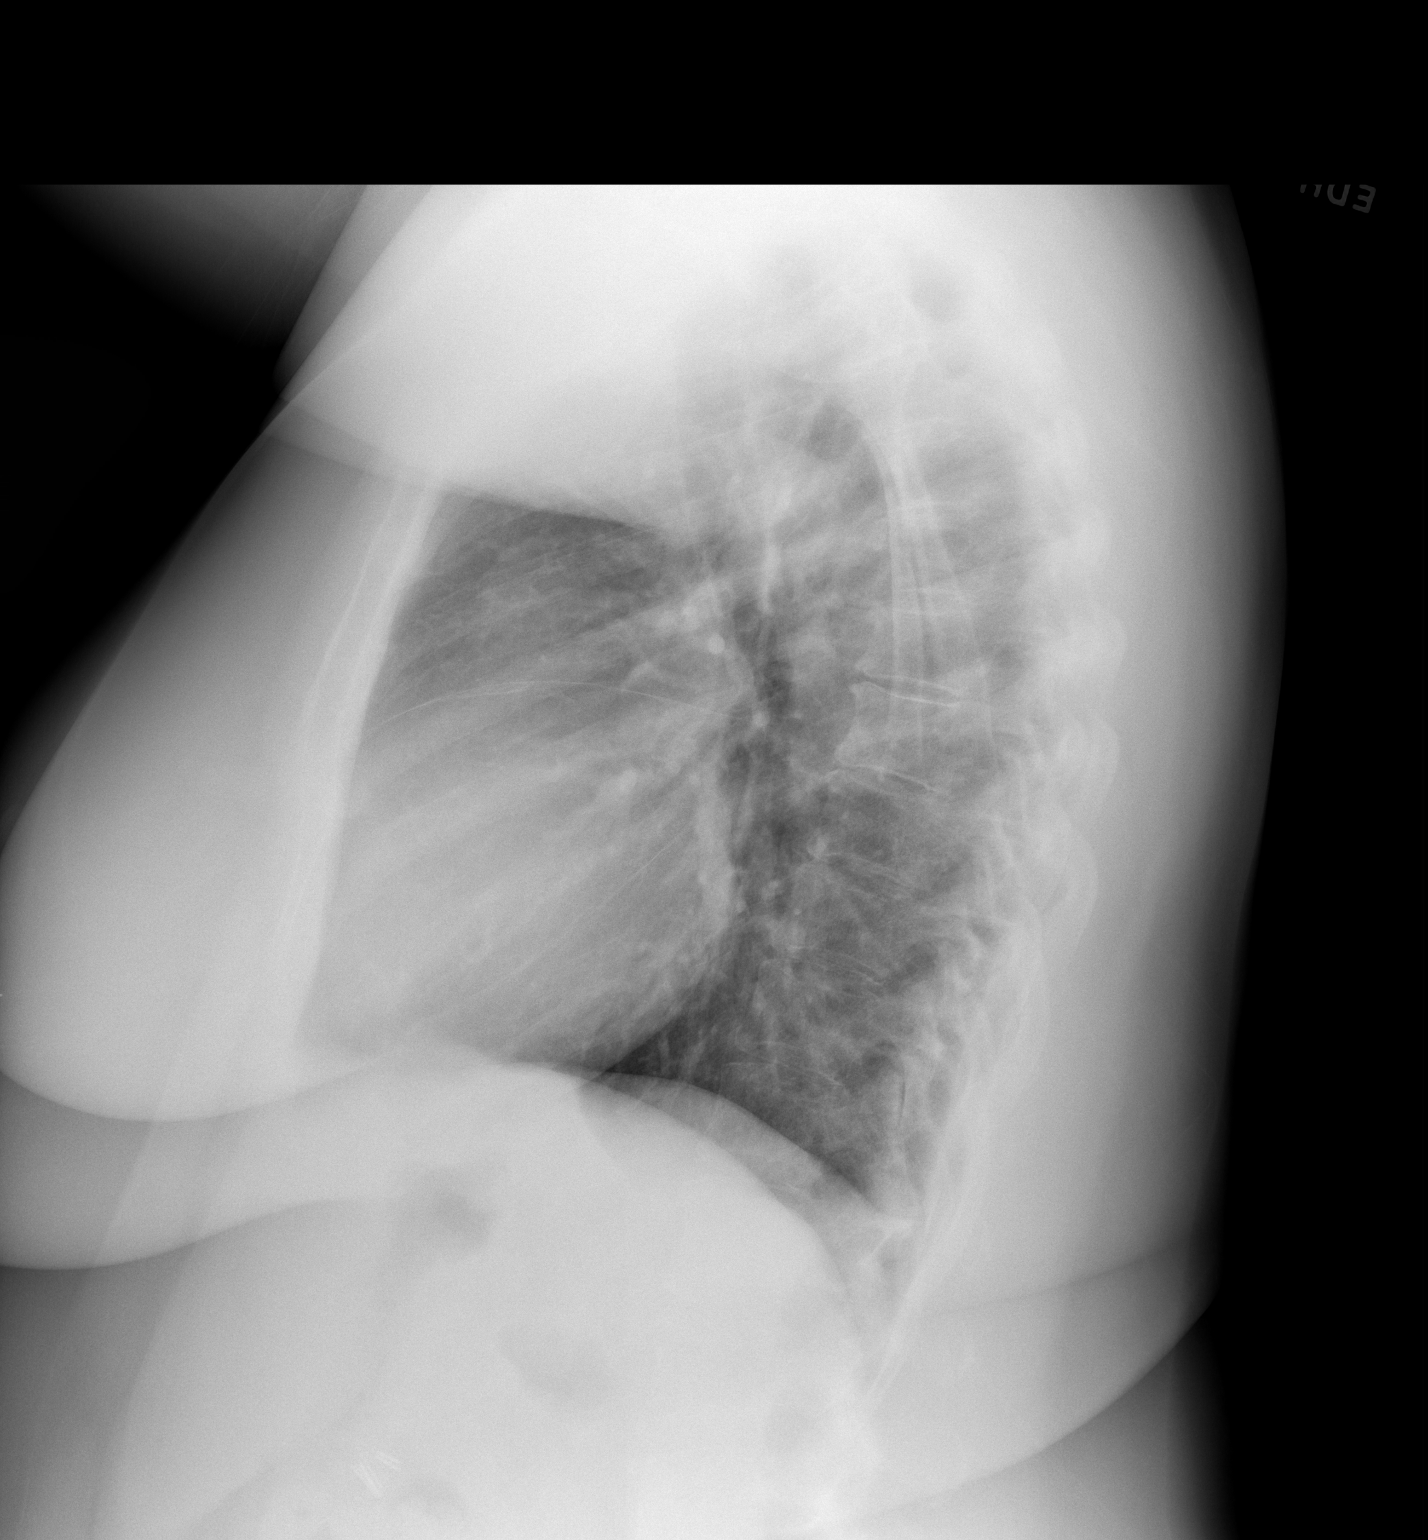

[2 of 2 positions shown; findings below may reference images not displayed]

FINDINGS: The heart size and mediastinal contours are normal. The lungs are
clear. There is no pleural effusion or pneumothorax. No acute
osseous findings are identified.
IMPRESSION: Stable chest.  No active cardiopulmonary process.

## 2021-12-18 MED ORDER — WEGOVY 0.5 MG/0.5ML ~~LOC~~ SOAJ
SUBCUTANEOUS | 0 refills | Status: DC
  Filled 2021-12-18: qty 2, 28d supply, fill #0

## 2021-12-20 ENCOUNTER — Other Ambulatory Visit (HOSPITAL_COMMUNITY): Payer: Self-pay

## 2021-12-20 MED ORDER — PANTOPRAZOLE SODIUM 40 MG PO TBEC
DELAYED_RELEASE_TABLET | ORAL | 0 refills | Status: DC
  Filled 2021-12-20: qty 90, 90d supply, fill #0

## 2021-12-21 ENCOUNTER — Other Ambulatory Visit (HOSPITAL_COMMUNITY): Payer: Self-pay

## 2022-01-04 ENCOUNTER — Other Ambulatory Visit (HOSPITAL_COMMUNITY): Payer: Self-pay

## 2022-01-04 MED ORDER — WEGOVY 1 MG/0.5ML ~~LOC~~ SOAJ
SUBCUTANEOUS | 0 refills | Status: DC
  Filled 2022-01-04: qty 2, 28d supply, fill #0

## 2022-01-05 ENCOUNTER — Emergency Department (HOSPITAL_BASED_OUTPATIENT_CLINIC_OR_DEPARTMENT_OTHER)
Admission: EM | Admit: 2022-01-05 | Discharge: 2022-01-05 | Disposition: A | Discharge status: Home / Self Care | Payer: No Typology Code available for payment source | Attending: Emergency Medicine | Admitting: Emergency Medicine

## 2022-01-05 ENCOUNTER — Other Ambulatory Visit: Payer: Self-pay

## 2022-01-05 ENCOUNTER — Other Ambulatory Visit (HOSPITAL_BASED_OUTPATIENT_CLINIC_OR_DEPARTMENT_OTHER): Payer: Self-pay

## 2022-01-05 ENCOUNTER — Other Ambulatory Visit (HOSPITAL_COMMUNITY): Payer: Self-pay

## 2022-01-05 ENCOUNTER — Encounter (HOSPITAL_BASED_OUTPATIENT_CLINIC_OR_DEPARTMENT_OTHER): Payer: Self-pay | Admitting: *Deleted

## 2022-01-05 DIAGNOSIS — S3992XA Unspecified injury of lower back, initial encounter: Secondary | ICD-10-CM | POA: Diagnosis present

## 2022-01-05 DIAGNOSIS — S39012A Strain of muscle, fascia and tendon of lower back, initial encounter: Secondary | ICD-10-CM | POA: Insufficient documentation

## 2022-01-05 DIAGNOSIS — X58XXXA Exposure to other specified factors, initial encounter: Secondary | ICD-10-CM | POA: Diagnosis not present

## 2022-01-05 DIAGNOSIS — T148XXA Other injury of unspecified body region, initial encounter: Secondary | ICD-10-CM

## 2022-01-05 DIAGNOSIS — M62830 Muscle spasm of back: Secondary | ICD-10-CM | POA: Diagnosis not present

## 2022-01-05 MED ORDER — ONDANSETRON 4 MG PO TBDP
4.0000 mg | ORAL_TABLET | Freq: Once | ORAL | Status: AC
  Administered 2022-01-05: 4 mg via ORAL
  Filled 2022-01-05: qty 1

## 2022-01-05 MED ORDER — CARVEDILOL 6.25 MG PO TABS
3.1250 mg | ORAL_TABLET | Freq: Once | ORAL | Status: AC
  Administered 2022-01-05: 3.125 mg via ORAL
  Filled 2022-01-05: qty 1

## 2022-01-05 MED ORDER — METHOCARBAMOL 500 MG PO TABS
500.0000 mg | ORAL_TABLET | Freq: Two times a day (BID) | ORAL | 0 refills | Status: DC | PRN
  Filled 2022-01-05 (×2): qty 12, 6d supply, fill #0

## 2022-01-05 MED ORDER — IBUPROFEN 600 MG PO TABS
600.0000 mg | ORAL_TABLET | Freq: Four times a day (QID) | ORAL | 0 refills | Status: DC | PRN
Start: 2022-01-05 — End: 2022-01-05
  Filled 2022-01-05: qty 30, 8d supply, fill #0

## 2022-01-05 MED ORDER — METHOCARBAMOL 500 MG PO TABS
500.0000 mg | ORAL_TABLET | Freq: Two times a day (BID) | ORAL | 0 refills | Status: DC | PRN
Start: 2022-01-05 — End: 2022-01-05
  Filled 2022-01-05: qty 12, 6d supply, fill #0

## 2022-01-05 MED ORDER — IBUPROFEN 600 MG PO TABS
600.0000 mg | ORAL_TABLET | Freq: Four times a day (QID) | ORAL | 0 refills | Status: DC | PRN
Start: 2022-01-05 — End: 2022-01-23
  Filled 2022-01-05 (×2): qty 30, 8d supply, fill #0

## 2022-01-05 MED ORDER — HYDROCODONE-ACETAMINOPHEN 5-325 MG PO TABS
1.0000 | ORAL_TABLET | Freq: Once | ORAL | Status: AC
  Administered 2022-01-05: 1 via ORAL
  Filled 2022-01-05: qty 1

## 2022-01-05 MED ORDER — HYDROCODONE-ACETAMINOPHEN 5-325 MG PO TABS
1.0000 | ORAL_TABLET | Freq: Four times a day (QID) | ORAL | 0 refills | Status: DC | PRN
  Filled 2022-01-05: qty 12, 3d supply, fill #0

## 2022-01-05 MED ORDER — HYDROCODONE-ACETAMINOPHEN 5-325 MG PO TABS
1.0000 | ORAL_TABLET | Freq: Four times a day (QID) | ORAL | 0 refills | Status: AC | PRN
Start: 2022-01-05 — End: 2022-01-08
  Filled 2022-01-05 (×2): qty 12, 3d supply, fill #0

## 2022-01-05 NOTE — ED Notes (Signed)
Has hx of HTN, did take meds, NBP reck in left arm sitting, 207/109/ (135)

## 2022-01-05 NOTE — Discharge Instructions (Addendum)
Rest, light activity, light stretching, heat, Motrin for moderate pain, Norco (Tylenol plus hydrocodone) or Robaxin (muscle relaxer) for severe pain.

## 2022-01-05 NOTE — ED Triage Notes (Signed)
3day hx of lower back pain, no numbness or tingling down legs, denies any injuries. Gait is altered due to pain

## 2022-01-05 NOTE — ED Provider Notes (Signed)
[facility] EMERGENCY DEPARTMENT Provider Note   CSN: [id number] Arrival date & time: 01/05/22  3086     History  Chief Complaint  Patient presents with   Back Pain    Shelly Cohen is a 61 y.o. female.  Patient is a 61 year old female with no significant past medical history presenting for complaints of back pain.  Patient admits to bilateral lower back pain that started 3 days ago after sleeping in a different position after getting a new bed mattress.  Denies any sensation changes in the lower extremities.  Denies any weakness or difficulty ambulating.  Denies any bowel or urinary incontinence.  She took Flexeril and Tylenol arthritis with little improvement of pain.  The history is provided by the patient. No language interpreter was used.  Back Pain Associated symptoms: no abdominal pain, no chest pain, no dysuria and no fever        Home Medications Prior to Admission medications   Medication Sig Start Date End Date Taking? Authorizing Provider  HYDROcodone-acetaminophen (NORCO) 5-325 MG tablet Take 1 tablet by mouth every 6 (six) hours as needed for up to 3 days for severe pain. 01/05/22 5/78/46 Yes Campbell Stall P, DO  ibuprofen (ADVIL) 600 MG tablet Take 1 tablet (600 mg total) by mouth every 6 (six) hours as needed for mild pain. 03/07/28  Yes Campbell Stall P, DO  methocarbamol (ROBAXIN) 500 MG tablet Take 1 tablet (500 mg total) by mouth 2 (two) times daily as needed for muscle spasms. 11/01/82  Yes Campbell Stall P, DO  benzonatate (TESSALON) 100 MG capsule Take 1 capsule (100 mg total) by mouth 3 (three) times daily as needed. Patient not taking: Reported on 09/11/2021 07/04/21   Mar Daring, PA-C  BIOTIN PO Take 1 tablet by mouth daily.    [provider]  CALCIUM-VITAMIN D PO Take 1 tablet by mouth daily.    [provider]  carvedilol (COREG) 3.125 MG tablet Take 1 tablet by mouth 2 times daily. 08/14/21   Nche, Charlene Brooke, NP   COVID-19 mRNA bivalent vaccine, Pfizer, (PFIZER COVID-19 VAC BIVALENT) injection Inject into the muscle. 04/12/21   Carlyle Basques, MD  docusate sodium (COLACE) 100 MG capsule Take 100 mg by mouth every other day.    [provider]  fluticasone (FLONASE) 50 MCG/ACT nasal spray Place 1 spray into both nostrils daily as needed for allergies or rhinitis.    [provider]  gabapentin (NEURONTIN) 100 MG capsule Take 1 capsule by mouth at bedtime. 08/14/21   Nche, Charlene Brooke, NP  ipratropium (ATROVENT) 0.03 % nasal spray Place 2 sprays into both nostrils every 12 (twelve) hours. Patient not taking: Reported on 09/11/2021 07/04/21   Mar Daring, PA-C  KRILL OIL PO Take 1 capsule by mouth daily.    [provider]  latanoprost (XALATAN) 0.005 % ophthalmic solution Place 1 drop into both eyes every evening 06/21/21     levothyroxine (SYNTHROID) 50 MCG tablet TAKE 1 TABLET BY MOUTH DAILY BEFORE BREAKFAST 08/14/21 08/14/22  Nche, Charlene Brooke, NP  MELATONIN PO Take 1 tablet by mouth at bedtime.    [provider]  pantoprazole (PROTONIX) 40 MG tablet Take 1 tablet (40 mg total) by mouth daily. Patient not taking: Reported on 09/11/2021 06/01/21     pantoprazole (PROTONIX) 40 MG tablet Take 1 tablet (40 mg total) by mouth once daily 12/20/21     promethazine-dextromethorphan (PROMETHAZINE-DM) 6.25-15 MG/5ML syrup Take 5 mLs by mouth 4 (  four) times daily as needed. Patient not taking: Reported on 09/11/2021 07/04/21   Mar Daring, PA-C  Semaglutide,0.25 or 0.'5MG'$ /DOS, (OZEMPIC, 0.25 OR 0.5 MG/DOSE,) 2 MG/3ML SOPN Inject 0.25 mg total into the skin once a week for 6 weeks then 0.'5mg'$  once a week for 2 weeks 09/07/21     Semaglutide-Weight Management (WEGOVY) 0.5 MG/0.5ML SOAJ Inject 0.5 mLs (0.5 mg total) subcutaneously once a week for 28 days 12/18/21     Semaglutide-Weight Management (WEGOVY) 1 MG/0.5ML SOAJ Inject 1 mg total into the skin once a week for 28 days  01/04/22     Semaglutide-Weight Management 0.25 MG/0.5ML SOAJ Inject 0.25 mg into the skin once a week 10/16/21         Allergies    Dilaudid [hydromorphone hcl], Diphenhydramine hcl, Hydrocodone, Oxycodone, Azithromycin, Cephalexin, Penicillin g, and Zolpidem    Review of Systems   Review of Systems  Constitutional:  Negative for chills and fever.  HENT:  Negative for ear pain and sore throat.   Eyes:  Negative for pain and visual disturbance.  Respiratory:  Negative for cough and shortness of breath.   Cardiovascular:  Negative for chest pain and palpitations.  Gastrointestinal:  Negative for abdominal pain and vomiting.  Genitourinary:  Negative for dysuria and hematuria.  Musculoskeletal:  Positive for back pain. Negative for arthralgias.  Skin:  Negative for color change and rash.  Neurological:  Negative for seizures and syncope.  All other systems reviewed and are negative.   Physical Exam Updated Vital Signs BP (!) 175/82   Pulse 86   Temp (!) 97.5 F (36.4 C) (Oral)   Resp 18   Ht '5\' 2"'$  (1.575 m)   Wt 83 kg   SpO2 97%   BMI 33.47 kg/m  Physical Exam Vitals and nursing note reviewed.  Constitutional:      General: She is not in acute distress.    Appearance: She is well-developed.  HENT:     Head: Normocephalic and atraumatic.  Eyes:     Conjunctiva/sclera: Conjunctivae normal.  Cardiovascular:     Rate and Rhythm: Normal rate and regular rhythm.     Heart sounds: No murmur heard. Pulmonary:     Effort: Pulmonary effort is normal. No respiratory distress.     Breath sounds: Normal breath sounds.  Abdominal:     Palpations: Abdomen is soft.     Tenderness: There is no abdominal tenderness.  Musculoskeletal:        General: No swelling.     Cervical back: Neck supple. No bony tenderness.     Thoracic back: No bony tenderness.     Lumbar back: Spasms and tenderness present. No bony tenderness.       Back:  Skin:    General: Skin is warm and dry.      Capillary Refill: Capillary refill takes less than 2 seconds.  Neurological:     Mental Status: She is alert.     GCS: GCS eye subscore is 4. GCS verbal subscore is 5. GCS motor subscore is 6.     Sensory: Sensation is intact.     Motor: Motor function is intact.     Gait: Gait is intact.  Psychiatric:        Mood and Affect: Mood normal.     ED Results / Procedures / Treatments   Labs (all labs ordered are listed, but only abnormal results are displayed) Labs Reviewed - No data to display  EKG None  Radiology  No results found.  Procedures Procedures    Medications Ordered in ED Medications  carvedilol (COREG) tablet 3.125 mg (3.125 mg Oral Given 01/05/22 0838)  HYDROcodone-acetaminophen (NORCO/VICODIN) 5-325 MG per tablet 1 tablet (1 tablet Oral Given 01/05/22 0839)  ondansetron (ZOFRAN-ODT) disintegrating tablet 4 mg (4 mg Oral Given 01/05/22 6789)    ED Course/ Medical Decision Making/ A&P                           Medical Decision Making Risk Prescription drug management.   21:78 AM 61 year old female with no significant past medical history presenting for complaints of back pain.  Patient is alert and oriented x3, no acute distress, afebrile, stable vital signs.  Physical exam demonstrates bilateral lower paraspinal muscle spasms.  No midline tenderness.  No neurovascular deficits.  Signs of cauda equina syndrome or epidural abscess.  Patient also hypertensive at 211/105 on arrival.  Patient states she did not take her antihypertensives prior to arrival.  Likely secondary to that and pain.  Patient's Coreg given in emergency department and Norco given for pain.  Patient had improvement of blood pressure prior to meds at 175/82.  Further improvement after blood pressure medications given.  On reevaluation patient midst to improvement of pain at this time.  Patient sent home with prescription for Norco and Robaxin however recommended to only take 1 every 6 hours.   Recommended against combining the medications due to the sedating and mood altering effects.  Patient agreeable to plan.  Patient in no distress and overall condition improved here in the ED. Detailed discussions were had with the patient regarding current findings, and need for close f/u with PCP or on call doctor. The patient has been instructed to return immediately if the symptoms worsen in any way including bowel or urinary incontinence, motor deficits, or sensation changes in the lower extremities for re-evaluation. Patient verbalized understanding and is in agreement with current care plan. All questions answered prior to discharge.         Final Clinical Impression(s) / ED Diagnoses Final diagnoses:  Muscle spasm of back  Muscle strain    Rx / DC Orders ED Discharge Orders          Ordered    HYDROcodone-acetaminophen (NORCO) 5-325 MG tablet  Every 6 hours PRN        01/05/22 0945    ibuprofen (ADVIL) 600 MG tablet  Every 6 hours PRN        01/05/22 0945    methocarbamol (ROBAXIN) 500 MG tablet  2 times daily PRN        01/05/22 0945              Lianne Cure, DO 38/10/17 0945

## 2022-01-09 ENCOUNTER — Encounter: Payer: Self-pay | Admitting: Nurse Practitioner

## 2022-01-16 ENCOUNTER — Encounter (HOSPITAL_BASED_OUTPATIENT_CLINIC_OR_DEPARTMENT_OTHER): Payer: Self-pay | Admitting: Pediatrics

## 2022-01-16 ENCOUNTER — Emergency Department (HOSPITAL_COMMUNITY)
Admission: EM | Admit: 2022-01-16 | Discharge: 2022-01-16 | Disposition: A | Discharge status: Home / Self Care | Payer: No Typology Code available for payment source | Attending: Emergency Medicine | Admitting: Emergency Medicine

## 2022-01-16 ENCOUNTER — Other Ambulatory Visit: Payer: Self-pay

## 2022-01-16 ENCOUNTER — Other Ambulatory Visit (HOSPITAL_BASED_OUTPATIENT_CLINIC_OR_DEPARTMENT_OTHER): Payer: Self-pay

## 2022-01-16 ENCOUNTER — Emergency Department (HOSPITAL_BASED_OUTPATIENT_CLINIC_OR_DEPARTMENT_OTHER)
Admission: EM | Admit: 2022-01-16 | Discharge: 2022-01-16 | Disposition: A | Discharge status: Home / Self Care | Payer: No Typology Code available for payment source | Source: Home / Self Care | Attending: Emergency Medicine | Admitting: Emergency Medicine

## 2022-01-16 ENCOUNTER — Emergency Department (HOSPITAL_COMMUNITY): Payer: No Typology Code available for payment source

## 2022-01-16 ENCOUNTER — Emergency Department (HOSPITAL_BASED_OUTPATIENT_CLINIC_OR_DEPARTMENT_OTHER): Payer: No Typology Code available for payment source

## 2022-01-16 DIAGNOSIS — R0789 Other chest pain: Secondary | ICD-10-CM | POA: Insufficient documentation

## 2022-01-16 DIAGNOSIS — Z5321 Procedure and treatment not carried out due to patient leaving prior to being seen by health care provider: Secondary | ICD-10-CM | POA: Diagnosis not present

## 2022-01-16 DIAGNOSIS — N644 Mastodynia: Secondary | ICD-10-CM | POA: Diagnosis present

## 2022-01-16 DIAGNOSIS — I10 Essential (primary) hypertension: Secondary | ICD-10-CM | POA: Insufficient documentation

## 2022-01-16 LAB — CBC WITH DIFFERENTIAL/PLATELET
Abs Immature Granulocytes: 0.02 10*3/uL (ref 0.00–0.07)
Basophils Absolute: 0 10*3/uL (ref 0.0–0.1)
Basophils Relative: 0 %
Eosinophils Absolute: 0.2 10*3/uL (ref 0.0–0.5)
Eosinophils Relative: 2 %
HCT: 40.1 % (ref 36.0–46.0)
Hemoglobin: 12.4 g/dL (ref 12.0–15.0)
Immature Granulocytes: 0 %
Lymphocytes Relative: 29 %
Lymphs Abs: 2.2 10*3/uL (ref 0.7–4.0)
MCH: 25.6 pg — ABNORMAL LOW (ref 26.0–34.0)
MCHC: 30.9 g/dL (ref 30.0–36.0)
MCV: 82.7 fL (ref 80.0–100.0)
Monocytes Absolute: 0.5 10*3/uL (ref 0.1–1.0)
Monocytes Relative: 7 %
Neutro Abs: 4.8 10*3/uL (ref 1.7–7.7)
Neutrophils Relative %: 62 %
Platelets: 239 10*3/uL (ref 150–400)
RBC: 4.85 MIL/uL (ref 3.87–5.11)
RDW: 15.1 % (ref 11.5–15.5)
WBC: 7.7 10*3/uL (ref 4.0–10.5)
nRBC: 0 % (ref 0.0–0.2)

## 2022-01-16 LAB — COMPREHENSIVE METABOLIC PANEL
ALT: 13 U/L (ref 0–44)
AST: 17 U/L (ref 15–41)
Albumin: 3.8 g/dL (ref 3.5–5.0)
Alkaline Phosphatase: 55 U/L (ref 38–126)
Anion gap: 8 (ref 5–15)
BUN: 18 mg/dL (ref 6–20)
CO2: 24 mmol/L (ref 22–32)
Calcium: 9.4 mg/dL (ref 8.9–10.3)
Chloride: 108 mmol/L (ref 98–111)
Creatinine, Ser: 0.93 mg/dL (ref 0.44–1.00)
GFR, Estimated: >60 mL/min (ref >60)
Glucose, Bld: 94 mg/dL (ref 70–99)
Potassium: 4 mmol/L (ref 3.5–5.1)
Sodium: 140 mmol/L (ref 135–145)
Total Bilirubin: 0.6 mg/dL (ref 0.3–1.2)
Total Protein: 7.6 g/dL (ref 6.5–8.1)

## 2022-01-16 LAB — TROPONIN I (HIGH SENSITIVITY)
Troponin I (High Sensitivity): 4 ng/L (ref <18)
Troponin I (High Sensitivity): 4 ng/L (ref <18)

## 2022-01-16 LAB — LIPASE, BLOOD: Lipase: 55 U/L — ABNORMAL HIGH (ref 11–51)

## 2022-01-16 LAB — D-DIMER, QUANTITATIVE: D-Dimer, Quant: 0.96 ug/mL-FEU — ABNORMAL HIGH (ref 0.00–0.50)

## 2022-01-16 MED ORDER — KETOROLAC TROMETHAMINE 15 MG/ML IJ SOLN
15.0000 mg | Freq: Once | INTRAMUSCULAR | Status: AC
  Administered 2022-01-16: 15 mg via INTRAVENOUS
  Filled 2022-01-16: qty 1

## 2022-01-16 MED ORDER — KETOROLAC TROMETHAMINE 15 MG/ML IJ SOLN
15.0000 mg | Freq: Once | INTRAMUSCULAR | Status: DC
Start: 2022-01-16 — End: 2022-01-16

## 2022-01-16 MED ORDER — LIDOCAINE 5 % EX PTCH
1.0000 | MEDICATED_PATCH | CUTANEOUS | Status: DC
  Administered 2022-01-16: 1 via TRANSDERMAL
  Filled 2022-01-16: qty 1

## 2022-01-16 MED ORDER — IOHEXOL 350 MG/ML SOLN
75.0000 mL | Freq: Once | INTRAVENOUS | Status: AC | PRN
  Administered 2022-01-16: 75 mL via INTRAVENOUS

## 2022-01-16 MED ORDER — METHOCARBAMOL 500 MG PO TABS
500.0000 mg | ORAL_TABLET | Freq: Three times a day (TID) | ORAL | 0 refills | Status: DC | PRN
  Filled 2022-01-16: qty 20, 7d supply, fill #0

## 2022-01-16 NOTE — ED Triage Notes (Signed)
BIB EMS from home.  Sudden sharp CP in left upper abdomen radiating to chest.  4 hours ago and gradually has gotten worse.  Woke from sleep. No new strenuous activity reports.  NSR at 96 with EMS.  Pt took 4 baby aspirin and flexeril PTA of EMS with no relief  Initial BP 238/120.  Hx of hypertension.  Has taken her meds today.  Given 0.4 mg nitro and 4 mg of morphine, and 4 mg zofran. No relief with nitro.  Some relief with morphine.  20G LAC

## 2022-01-16 NOTE — Discharge Instructions (Signed)
You came to the emergency department today to be evaluated for your chest pain.  Your EKG and lab work were reassuring that you are not having a heart attack.  The CT scan of your chest did not show any blood clots or problems with your lungs.  Your pain is likely musculoskeletal in nature and should improve over time.  You received the medication Toradol in the emergency department.  Please refrain from using any ibuprofen or other NSAID medication for 24 hours.  After that you may take ibuprofen as listed below.  Today you were prescribed Methocarbamol (Robaxin).  Methocarbamol (Robaxin) is used to treat muscle spasms/pain.  It works by helping to relax the muscles.  Drowsiness, dizziness, lightheadedness, stomach upset, nausea/vomiting, or blurred vision may occur.  Do not drive, use machinery, or do anything that needs alertness or clear vision until you can do it safely.  Do not combine this medication with alcoholic beverages, marijuana, or other central nervous system depressants.    Please take Ibuprofen (Advil, motrin) and Tylenol (acetaminophen) to relieve your pain.    You may take up to 600 MG (3 pills) of normal strength ibuprofen every 8 hours as needed.   You make take tylenol, up to 1,000 mg (two extra strength pills) every 8 hours as needed.   It is safe to take ibuprofen and tylenol at the same time as they work differently.   Do not take more than 3,000 mg tylenol in a 24 hour period (not more than one dose every 8 hours.  Please check all medication labels as many medications such as pain and cold medications may contain tylenol.  Do not drink alcohol while taking these medications.  Do not take other NSAID'S while taking ibuprofen (such as aleve or naproxen).  Please take ibuprofen with food to decrease stomach upset.  Get help right away if: You have nausea or vomiting. You feel sweaty or light-headed. You have a cough with mucus from your lungs (sputum) or you cough up  blood. You develop shortness of breath.

## 2022-01-16 NOTE — ED Notes (Signed)
Triage RN aware of patient BP

## 2022-01-16 NOTE — ED Notes (Signed)
Patient left without being seen, states "I have waited almost 11 hours and I will just go to another [facility] facility."

## 2022-01-16 NOTE — ED Triage Notes (Signed)
C/O chest pain that started last night around 11pm and woke her up; reported went to [facility] but did not see the provider.

## 2022-01-16 NOTE — ED Provider Triage Note (Signed)
Emergency Medicine Provider Triage Evaluation Note  Shelly Cohen , a 61 y.o. female  was evaluated in triage.  Pt complains of sharp pain under L breast which woke patient from sleep. Pain radiates to the back. Is worse with movement and breathing. Took 324 ASA prior to EMS arrival. Given '4mg'$  Morphine en route to the ED. No prior hx of ACS. Denies tobacco use, cocaine use. Does have hx of gastric sleeve. Continues to pass gas. Last BM earlier today. No vomiting, diaphoresis, fever.  Review of Systems  Positive: As above Negative: As above  Physical Exam  BP (!) 174/81 (BP Location: Right Arm)   Pulse 91   Temp 98.1 F (36.7 C) (Oral)   Resp 20   SpO2 95%  Gen:   Awake, no distress   Resp:  Normal effort  MSK:   Moves extremities without difficulty  Other:  TTP under L breast and to LUQ. Abdomen soft, nondistended. No peritoneal signs. Lungs CTAB.  Medical Decision Making  Medically screening exam initiated at 2:40 AM.  Appropriate orders placed.  Shelly Cohen was informed that the remainder of the evaluation will be completed by another provider, this initial triage assessment does not replace that evaluation, and the importance of remaining in the ED until their evaluation is complete.  L chest pain - reproducible on palpation in triage suggestive of musculoskeletal etiology such as chest wall spasm; however, will initiate chest pain work-up to further assess.   Antonietta Breach, PA-C 01/16/22 [phone]

## 2022-01-16 NOTE — ED Provider Notes (Signed)
[facility] EMERGENCY DEPARTMENT Provider Note   CSN: [id number] Arrival date & time: 01/16/22  1416     History  Chief Complaint  Patient presents with   Chest Pain    Shelly Cohen is a 61 y.o. female with past medical history of hypertension, thyroid disease, prediabetes.  Presents the emergency department the chief complaint of chest pain.  Patient reports that chest pain started last night at 11 PM.  Pain woke her from sleep.  Pain patient reports that pain is located underneath her left breast and wraps around to her back.  Patient describes pain as sharp.  Pain is worse with touch, movement, and deep inhalation.  Patient reports that she took muscle relaxer yesterday evening with no relief of symptoms.  Patient states that she also took '324mg'$  of aspirin at midnight last night.  Patient denies any recent falls or traumatic injuries.  Patient reports that she was at [facility] ED last night for her symptoms however left without being seen after triage due to prolonged wait time.  Patient denies any fever, chills, shortness of breath, hemoptysis, palpitations, leg swelling or tenderness, abdominal pain, nausea, vomiting, diaphoresis, surgery in the last 4 weeks, history of malignancy, hormone therapy use, family history of CAD less than 2, history of DVT/PE.   Chest Pain Associated symptoms: no abdominal pain, no back pain, no dizziness, no fever, no headache, no nausea, no palpitations, no shortness of breath and no vomiting        Home Medications Prior to Admission medications   Medication Sig Start Date End Date Taking? Authorizing Provider  benzonatate (TESSALON) 100 MG capsule Take 1 capsule (100 mg total) by mouth 3 (three) times daily as needed. Patient not taking: Reported on 09/11/2021 07/04/21   Mar Daring, PA-C  BIOTIN PO Take 1 tablet by mouth daily.    [provider]  CALCIUM-VITAMIN D PO Take 1 tablet by mouth daily.    [provider]  carvedilol (COREG) 3.125 MG tablet Take 1 tablet by mouth 2 times daily. 08/14/21   Nche, Charlene Brooke, NP  COVID-19 mRNA bivalent vaccine, Pfizer, (PFIZER COVID-19 VAC BIVALENT) injection Inject into the muscle. 04/12/21   Carlyle Basques, MD  docusate sodium (COLACE) 100 MG capsule Take 100 mg by mouth every other day.    [provider]  fluticasone (FLONASE) 50 MCG/ACT nasal spray Place 1 spray into both nostrils daily as needed for allergies or rhinitis.    [provider]  gabapentin (NEURONTIN) 100 MG capsule Take 1 capsule by mouth at bedtime. 08/14/21   Nche, Charlene Brooke, NP  ibuprofen (ADVIL) 600 MG tablet Take 1 tablet (600 mg total) by mouth every 6 (six) hours as needed for mild pain. 09/07/01   Campbell Stall P, DO  ipratropium (ATROVENT) 0.03 % nasal spray Place 2 sprays into both nostrils every 12 (twelve) hours. Patient not taking: Reported on 09/11/2021 07/04/21   Mar Daring, PA-C  KRILL OIL PO Take 1 capsule by mouth daily.    [provider]  latanoprost (XALATAN) 0.005 % ophthalmic solution Place 1 drop into both eyes every evening 06/21/21     levothyroxine (SYNTHROID) 50 MCG tablet TAKE 1 TABLET BY MOUTH DAILY BEFORE BREAKFAST 08/14/21 08/14/22  Nche, Charlene Brooke, NP  MELATONIN PO Take 1 tablet by mouth at bedtime.    [provider]  methocarbamol (ROBAXIN) 500 MG tablet Take 1 tablet (500 mg total) by mouth 2 (two) times daily  as needed for muscle spasms. 01/04/16   Campbell Stall P, DO  pantoprazole (PROTONIX) 40 MG tablet Take 1 tablet (40 mg total) by mouth daily. Patient not taking: Reported on 09/11/2021 06/01/21     pantoprazole (PROTONIX) 40 MG tablet Take 1 tablet (40 mg total) by mouth once daily 12/20/21     promethazine-dextromethorphan (PROMETHAZINE-DM) 6.25-15 MG/5ML syrup Take 5 mLs by mouth 4 (four) times daily as needed. Patient not taking: Reported on 09/11/2021 07/04/21   Mar Daring, PA-C   Semaglutide,0.25 or 0.'5MG'$ /DOS, (OZEMPIC, 0.25 OR 0.5 MG/DOSE,) 2 MG/3ML SOPN Inject 0.25 mg total into the skin once a week for 6 weeks then 0.'5mg'$  once a week for 2 weeks 09/07/21     Semaglutide-Weight Management (WEGOVY) 0.5 MG/0.5ML SOAJ Inject 0.5 mLs (0.5 mg total) subcutaneously once a week for 28 days 12/18/21     Semaglutide-Weight Management (WEGOVY) 1 MG/0.5ML SOAJ Inject 1 mg total into the skin once a week for 28 days 01/04/22     Semaglutide-Weight Management 0.25 MG/0.5ML SOAJ Inject 0.25 mg into the skin once a week 10/16/21         Allergies    Dilaudid [hydromorphone hcl], Diphenhydramine hcl, Hydrocodone, Oxycodone, Azithromycin, Cephalexin, Penicillin g, and Zolpidem    Review of Systems   Review of Systems  Constitutional:  Negative for chills and fever.  Eyes:  Negative for visual disturbance.  Respiratory:  Negative for shortness of breath.   Cardiovascular:  Positive for chest pain. Negative for palpitations.  Gastrointestinal:  Negative for abdominal pain, nausea and vomiting.  Genitourinary:  Negative for difficulty urinating and dysuria.  Musculoskeletal:  Negative for back pain and neck pain.  Skin:  Negative for color change and rash.  Neurological:  Negative for dizziness, syncope, light-headedness and headaches.  Psychiatric/Behavioral:  Negative for confusion.     Physical Exam Updated Vital Signs BP (!) 186/88 (BP Location: Right Arm)   Pulse 88   Temp 97.8 F (36.6 C) (Oral)   Resp 17   Ht '5\' 2"'$  (1.575 m)   Wt 82.6 kg   SpO2 99%   BMI 33.29 kg/m  Physical Exam Vitals and nursing note reviewed.  Constitutional:      General: She is not in acute distress.    Appearance: She is not ill-appearing, toxic-appearing or diaphoretic.  HENT:     Head: Normocephalic.  Eyes:     General: No scleral icterus.       Right eye: No discharge.        Left eye: No discharge.  Cardiovascular:     Rate and Rhythm: Normal rate.     Pulses:          Radial  pulses are 2+ on the right side and 2+ on the left side.  Pulmonary:     Effort: Pulmonary effort is normal. No tachypnea, bradypnea or respiratory distress.     Breath sounds: Normal breath sounds. No stridor.  Chest:     Chest wall: Tenderness present. No mass, lacerations, deformity, swelling, crepitus or edema.     Comments: Tenderness to chest wall underneath left breast and wrapping around to left flank.  No rash, erythema, warmth, or wounds Musculoskeletal:     Right lower leg: No edema.     Left lower leg: No edema.  Skin:    General: Skin is warm and dry.  Neurological:     General: No focal deficit present.     Mental Status: She is alert.  Psychiatric:        Behavior: Behavior is cooperative.     ED Results / Procedures / Treatments   Labs (all labs ordered are listed, but only abnormal results are displayed) Labs Reviewed  D-DIMER, QUANTITATIVE - Abnormal; Notable for the following components:      Result Value   D-Dimer, Quant 0.96 (*)    All other components within normal limits  TROPONIN I (HIGH SENSITIVITY)    EKG None  Radiology CT Angio Chest PE W and/or Wo Contrast  Result Date: 01/16/2022 CLINICAL DATA:  Chest pain. EXAM: CT ANGIOGRAPHY CHEST WITH CONTRAST TECHNIQUE: Multidetector CT imaging of the chest was performed using the standard protocol during bolus administration of intravenous contrast. Multiplanar CT image reconstructions and MIPs were obtained to evaluate the vascular anatomy. RADIATION DOSE REDUCTION: This exam was performed according to the departmental dose-optimization program which includes automated exposure control, adjustment of the mA and/or kV according to patient size and/or use of iterative reconstruction technique. CONTRAST:  71m OMNIPAQUE IOHEXOL 350 MG/ML SOLN COMPARISON:  August 17 2020. FINDINGS: Cardiovascular: Satisfactory opacification of the pulmonary arteries to the segmental level. No evidence of pulmonary embolism.  Normal heart size. No pericardial effusion. Mediastinum/Nodes: No enlarged mediastinal, hilar, or axillary lymph nodes. Thyroid gland, trachea, and esophagus demonstrate no significant findings. Lungs/Pleura: Lungs are clear. No pleural effusion or pneumothorax. Upper Abdomen: No acute abnormality. Musculoskeletal: No chest wall abnormality. No acute or significant osseous findings. Review of the MIP images confirms the above findings. IMPRESSION: No definite evidence of pulmonary embolus. No acute abnormality seen in the chest. Electronically Signed   By: JMarijo ConceptionM.D.   On: 01/16/2022 15:54   DG Chest 2 View  Result Date: 01/16/2022 CLINICAL DATA:  Chest pain. EXAM: CHEST - 2 VIEW COMPARISON:  08/31/2020. FINDINGS: The heart size and mediastinal contours are within normal limits. There is atherosclerotic calcification of the aorta. No consolidation, effusion, or pneumothorax. No acute osseous abnormality. IMPRESSION: No active cardiopulmonary disease. Electronically Signed   By: LBrett FairyM.D.   On: 01/16/2022 03:19    Procedures Procedures    Medications Ordered in ED Medications  lidocaine (LIDODERM) 5 % 1 patch (1 patch Transdermal Patch Applied 01/16/22 1515)  ketorolac (TORADOL) 15 MG/ML injection 15 mg (has no administration in time range)  iohexol (OMNIPAQUE) 350 MG/ML injection 75 mL (75 mLs Intravenous Contrast Given 01/16/22 1530)    ED Course/ Medical Decision Making/ A&P                           Medical Decision Making Amount and/or Complexity of Data Reviewed Labs: ordered. Radiology: ordered.  Risk Prescription drug management.   Alert 61year old female in no acute distress, nontoxic-appearing.  Presents to the ED with a chief complaint of chest pain.  Information was obtained from patient and patient's daughter at bedside.  I reviewed patient's past medical records including previous provider notes, labs, and imaging.  Patient has medical history as outlined  in HPI which complicates her care.  [facility] review [facility]  Patient left without being seen by provider after triage.  I personally viewed and interpreted patient's chest x-ray from that visit.  Imaging shows no active cardiopulmonary disease.  I personally viewed and interpreted patient's lab results from that visit.  Pertinent findings include: Troponin 4, CMP unremarkable, lipase 55, CBC unremarkable.  Due to reports  of chest pain will complete ACS work-up with second troponin and repeat EKG.  As patient describes pain is pleuritic will obtain D-dimer, evaluate for PE as patient is low risk on Wells criteria.  Patient does have tenderness to chest wall upon examination, will give her lidocaine patch.    I personally viewed and interpreted patient's EKG.  Tracing shows sinus rhythm.  I personally viewed interpret patient's lab results.  Pertinent findings include: -Troponin 4 -D-dimer 0.96  Due to elevated D-dimer will obtain CTA of chest to evaluate for possible PE.  I personally viewed and interpreted patient's CTA, agree with radiology interpretation of no acute PE  Patient has serial troponin of 4 and 4 with delta of 0.  Low suspicion for ACS at this time.  Patient is noted to be hypertensive.  Has history of hypertension and reports that she did not take her medications today.  Suspect that blood pressure is elevated due to noncompliance with medication today as well as pain.  Patient reports that she has improvement in pain after lidocaine patch was applied.  Suspect that patient's pain is musculoskeletal in nature.  We will give patient a shot of Toradol before leaving the emergency department.  We will refill patient's prescription for Robaxin.  Patient to follow-up with primary care doctor for repeat evaluation.  Patient was also advised to look for rash development denies pain can be prodromal from herpes zoster  infection.  Patient care discussed with attending physician Dr. Pearline Cables.  Based on patient's chief complaint, I considered admission might be necessary, however after reassuring ED workup feel patient is reasonable for discharge.  Discussed results, findings, treatment and follow up. Patient advised of return precautions. Patient verbalized understanding and agreed with plan.  Portions of this note were generated with [employer]. Dictation errors may occur despite best attempts at proofreading.         Final Clinical Impression(s) / ED Diagnoses Final diagnoses:  Chest wall pain    Rx / DC Orders ED Discharge Orders          Ordered    methocarbamol (ROBAXIN) 500 MG tablet  Every 8 hours PRN        01/16/22 1616              Loni Beckwith, PA-C 01/16/22 1622    Jeanell Sparrow, DO 01/17/22 [phone]

## 2022-01-23 ENCOUNTER — Encounter: Payer: Self-pay | Admitting: Nurse Practitioner

## 2022-01-23 ENCOUNTER — Ambulatory Visit (INDEPENDENT_AMBULATORY_CARE_PROVIDER_SITE_OTHER): Payer: No Typology Code available for payment source | Admitting: Nurse Practitioner

## 2022-01-23 ENCOUNTER — Other Ambulatory Visit (HOSPITAL_COMMUNITY): Payer: Self-pay

## 2022-01-23 VITALS — BP 180/100 | HR 83 | Temp 97.2°F | Ht 62.0 in | Wt 179.2 lb

## 2022-01-23 DIAGNOSIS — E039 Hypothyroidism, unspecified: Secondary | ICD-10-CM

## 2022-01-23 DIAGNOSIS — G4733 Obstructive sleep apnea (adult) (pediatric): Secondary | ICD-10-CM

## 2022-01-23 DIAGNOSIS — I10 Essential (primary) hypertension: Secondary | ICD-10-CM

## 2022-01-23 DIAGNOSIS — E1165 Type 2 diabetes mellitus with hyperglycemia: Secondary | ICD-10-CM | POA: Diagnosis not present

## 2022-01-23 LAB — TSH: TSH: 2.75 u[IU]/mL (ref 0.35–5.50)

## 2022-01-23 LAB — HEMOGLOBIN A1C: Hgb A1c MFr Bld: 6 % (ref 4.6–6.5)

## 2022-01-23 LAB — T4, FREE: Free T4: 1.09 ng/dL (ref 0.60–1.60)

## 2022-01-23 MED ORDER — LEVOTHYROXINE SODIUM 50 MCG PO TABS
ORAL_TABLET | Freq: Every day | ORAL | 1 refills | Status: DC
  Filled 2022-01-23 – 2022-01-26 (×2): qty 90, 90d supply, fill #0
  Filled 2022-05-10: qty 90, 90d supply, fill #1

## 2022-01-23 MED ORDER — AMLODIPINE BESYLATE 5 MG PO TABS
5.0000 mg | ORAL_TABLET | Freq: Every evening | ORAL | 1 refills | Status: DC
  Filled 2022-01-23: qty 90, 90d supply, fill #0
  Filled 2022-04-18: qty 90, 90d supply, fill #1

## 2022-01-23 NOTE — Addendum Note (Signed)
Addended by: Wilfred Lacy L on: 01/23/2022 01:03 PM   Modules accepted: Orders

## 2022-01-23 NOTE — Progress Notes (Addendum)
Established Patient Visit  Patient: Shelly Cohen   DOB: 11/02/1960   61 y.o. Female  MRN: [id number] Visit Date: 01/23/2022  Subjective:    Chief Complaint  Patient presents with   [facility] F/u 01/05/22 & 01/16/22 Still having lower back pain but Spasms have stopped  No other concerns today  Requested records for eye exam    HPI Resolved chest and back pain. Use of extra strength tylenol prn. Reviewed ED note, lab results and radiology report: pain possibly due to musculoskeletal strain.  HTN (hypertension), benign BP not at goal with coreg BID, associated with intermittent headache. reports she is compliance with diet and medication Lisinopril, amlodipine and spironolactone d/c in past due to hypotension. BP Readings from Last 3 Encounters:  01/23/22 (!) 180/100  01/16/22 (!) 166/74  01/16/22 (!) 186/80   Add amlodipine '5mg'$  in PM Continue coreg BID F/up in 50month Hypothyroidism Repeat TSH and T4: stable Maintain med dose Refill sent  DM (diabetes mellitus) ([place] [place] Repeat hgbA1c  OSA (obstructive sleep apnea) Home Sleep study 2021 indicates mild OSA, recommended use of oral device and/or maintain lateral sleep position and weight loss or use of CPAP with setting 5-152mg. She denies any daytime somnolence.  BP Readings from Last 3 Encounters:  01/23/22 (!) 180/100  01/16/22 (!) 166/74  01/16/22 (!) 186/80    Wt Readings from Last 3 Encounters:  01/23/22 179 lb 3.2 oz (81.3 kg)  01/16/22 182 lb (82.6 kg)  01/05/22 182 lb 15.7 oz (83 kg)    Reviewed medical, surgical, and social history today  Medications: Outpatient Medications Prior to Visit  Medication Sig   BIOTIN PO Take 1 tablet by mouth daily.   CALCIUM-VITAMIN D PO Take 1 tablet by mouth daily.   carvedilol (COREG) 3.125 MG tablet Take 1 tablet by mouth 2 times daily.   COVID-19 mRNA bivalent vaccine, Pfizer, (PFIZER COVID-19 VAC BIVALENT) injection Inject into the  muscle.   docusate sodium (COLACE) 100 MG capsule Take 100 mg by mouth every other day.   fluticasone (FLONASE) 50 MCG/ACT nasal spray Place 1 spray into both nostrils daily as needed for allergies or rhinitis.   gabapentin (NEURONTIN) 100 MG capsule Take 1 capsule by mouth at bedtime.   KRILL OIL PO Take 1 capsule by mouth daily.   latanoprost (XALATAN) 0.005 % ophthalmic solution Place 1 drop into both eyes every evening   MELATONIN PO Take 1 tablet by mouth at bedtime.   methocarbamol (ROBAXIN) 500 MG tablet Take 1 tablet (500 mg total) by mouth every 8 (eight) hours as needed for muscle spasms.   pantoprazole (PROTONIX) 40 MG tablet Take 1 tablet (40 mg total) by mouth daily.   Semaglutide-Weight Management (WEGOVY) 1 MG/0.5ML SOAJ Inject 1 mg total into the skin once a week for 28 days   [DISCONTINUED] levothyroxine (SYNTHROID) 50 MCG tablet TAKE 1 TABLET BY MOUTH DAILY BEFORE BREAKFAST   [DISCONTINUED] benzonatate (TESSALON) 100 MG capsule Take 1 capsule (100 mg total) by mouth 3 (three) times daily as needed. (Patient not taking: Reported on 09/11/2021)   [DISCONTINUED] ibuprofen (ADVIL) 600 MG tablet Take 1 tablet (600 mg total) by mouth every 6 (six) hours as needed for mild pain. (Patient not taking: Reported on 01/23/2022)   [DISCONTINUED] ipratropium (ATROVENT) 0.03 % nasal spray Place 2 sprays into both nostrils every 12 (twelve) hours. (Patient not taking: Reported on  09/11/2021)   [DISCONTINUED] pantoprazole (PROTONIX) 40 MG tablet Take 1 tablet (40 mg total) by mouth once daily (Patient not taking: Reported on 01/23/2022)   [DISCONTINUED] promethazine-dextromethorphan (PROMETHAZINE-DM) 6.25-15 MG/5ML syrup Take 5 mLs by mouth 4 (four) times daily as needed. (Patient not taking: Reported on 09/11/2021)   [DISCONTINUED] Semaglutide,0.25 or 0.'5MG'$ /DOS, (OZEMPIC, 0.25 OR 0.5 MG/DOSE,) 2 MG/3ML SOPN Inject 0.25 mg total into the skin once a week for 6 weeks then 0.'5mg'$  once a week for 2 weeks    [DISCONTINUED] Semaglutide-Weight Management (WEGOVY) 0.5 MG/0.5ML SOAJ Inject 0.5 mLs (0.5 mg total) subcutaneously once a week for 28 days   [DISCONTINUED] Semaglutide-Weight Management 0.25 MG/0.5ML SOAJ Inject 0.25 mg into the skin once a week   [DISCONTINUED] Semaglutide-Weight Management 1 MG/0.5ML SOAJ Inject into the skin. (Patient not taking: Reported on 01/23/2022)   No facility-administered medications prior to visit.   Reviewed past medical and social history.   ROS per HPI above      Objective:  BP (!) 180/100 (BP Location: Left Arm, Patient Position: Sitting)   Pulse 83   Temp (!) 97.2 F (36.2 C) (Temporal)   Ht '5\' 2"'$  (1.575 m)   Wt 179 lb 3.2 oz (81.3 kg)   SpO2 98%   BMI 32.78 kg/m      Physical Exam  Results for orders placed or performed in visit on 01/23/22  TSH  Result Value Ref Range   TSH 2.75 0.35 - 5.50 uIU/mL  T4, free  Result Value Ref Range   Free T4 1.09 0.60 - 1.60 ng/dL  Hemoglobin A1c  Result Value Ref Range   Hgb A1c MFr Bld 6.0 4.6 - 6.5 %      Assessment & Plan:    Problem List Items Addressed This Visit       Cardiovascular and Mediastinum   HTN (hypertension), benign - Primary    BP not at goal with coreg BID, associated with intermittent headache. reports she is compliance with diet and medication Lisinopril, amlodipine and spironolactone d/c in past due to hypotension. BP Readings from Last 3 Encounters:  01/23/22 (!) 180/100  01/16/22 (!) 166/74  01/16/22 (!) 186/80   Add amlodipine '5mg'$  in PM Continue coreg BID F/up in 12month     Relevant Medications   amLODipine (NORVASC) 5 MG tablet     Respiratory   OSA (obstructive sleep apnea)    Home Sleep study 2021 indicates mild OSA, recommended use of oral device and/or maintain lateral sleep position and weight loss or use of CPAP with setting 5-134mg. She denies any daytime somnolence.        Endocrine   DM (diabetes mellitus) ([facility]   Repeat hgbA1c       Relevant Orders   Hemoglobin A1c (Completed)   Hypothyroidism    Repeat TSH and T4: stable Maintain med dose Refill sent      Relevant Medications   levothyroxine (SYNTHROID) 50 MCG tablet   Other Relevant Orders   TSH (Completed)   T4, free (Completed)   Return in about 4 weeks (around 02/20/2022) for CPE and HTN f/up.     ChWilfred LacyNP

## 2022-01-23 NOTE — Assessment & Plan Note (Addendum)
Repeat TSH and T4: stable Maintain med dose Refill sent

## 2022-01-23 NOTE — Assessment & Plan Note (Addendum)
BP not at goal with coreg BID, associated with intermittent headache. reports she is compliance with diet and medication Lisinopril, amlodipine and spironolactone d/c in past due to hypotension. BP Readings from Last 3 Encounters:  01/23/22 (!) 180/100  01/16/22 (!) 166/74  01/16/22 (!) 186/80   Add amlodipine '5mg'$  in PM Continue coreg BID F/up in 71month

## 2022-01-23 NOTE — Assessment & Plan Note (Signed)
Home Sleep study 2021 indicates mild OSA, recommended use of oral device and/or maintain lateral sleep position and weight loss or use of CPAP with setting 5-77mHg. She denies any daytime somnolence.

## 2022-01-23 NOTE — Assessment & Plan Note (Signed)
Repeat hgbA1c 

## 2022-01-23 NOTE — Patient Instructions (Signed)
Start amlodipine '5mg'$  in PM Maintain coreg dose Go to lab F/up in 16month

## 2022-01-26 ENCOUNTER — Other Ambulatory Visit (HOSPITAL_COMMUNITY): Payer: Self-pay

## 2022-01-31 ENCOUNTER — Other Ambulatory Visit (HOSPITAL_COMMUNITY): Payer: Self-pay

## 2022-02-07 ENCOUNTER — Other Ambulatory Visit (HOSPITAL_COMMUNITY): Payer: Self-pay

## 2022-02-07 MED ORDER — WEGOVY 1.7 MG/0.75ML ~~LOC~~ SOAJ
SUBCUTANEOUS | 0 refills | Status: DC
Start: 2022-02-06 — End: 2022-03-22
  Filled 2022-02-07: qty 3, 28d supply, fill #0

## 2022-02-20 ENCOUNTER — Encounter: Payer: Self-pay | Admitting: Skilled Nursing Facility1

## 2022-02-20 ENCOUNTER — Encounter
Payer: No Typology Code available for payment source | Attending: Nurse Practitioner | Admitting: Skilled Nursing Facility1

## 2022-02-20 DIAGNOSIS — E1165 Type 2 diabetes mellitus with hyperglycemia: Secondary | ICD-10-CM | POA: Insufficient documentation

## 2022-02-20 DIAGNOSIS — E669 Obesity, unspecified: Secondary | ICD-10-CM | POA: Insufficient documentation

## 2022-02-20 NOTE — Progress Notes (Signed)
Bariatric Nutrition Follow-Up Visit Medical Nutrition Therapy    Sleeve gastrectomy 02/20/2021  NUTRITION ASSESSMENT     Anthropometrics  Start weight at [facility]: 219.2 lbs (date: 09/21/2020)  Weight: 174.8 pounds   Clinical  Medical hx: HTN, DM Medications: see list; Wegovy Labs: A1C 6.0, Lipase 55 Notable signs/symptoms: stiff hip Any previous deficiencies? No   Body Composition Scale 03/07/2021 04/20/2021 07/03/2021 09/07/2021 02/20/2022  Current Body Weight 190 185 182.1 180.3 174.8  Total Body Fat % 42 40.6 40.4 40.1 39.2  Visceral Fat '13 13 13 12 12  '$ Fat-Free Mass % 57.9 59.3 59.5 59.8 60.7   Total Body Water % 43.4 44.1 44.2 44.4 44.8  Muscle-Mass lbs 27.1 27.7 27.5 27.4 27.4  BMI 34.5 33.6 33.1 32.8 31.8  Body Fat Displacement              Torso  lbs 49.3 46.5 45.5 44.7 42.4         Left Leg  lbs 9.8 9.3 9.1 8.9 8.4         Right Leg  lbs 9.8 9.3 9.1 8.9 8.4         Left Arm  lbs 4.9 4.6 4.5 4.4 4.2         Right Arm   lbs 4.9 4.6 4.5 4.4 4.2     Lifestyle & Dietary Hx  Pt states she does not check her blood sugars.   Pt states she does not diet anymore it is just a way of life.  Pt sates she is going through a divorce so has more stress causing higher BP. Pt states she was having cravings during her stressful time so has started [facility].    Estimated daily fluid intake: 64+ oz Estimated daily protein intake: 80+ g Supplements: multi and calcium Current average weekly physical activity: walking 3 days a week 30 minutes, treadmill at home; 10 pound weights (increased from 5 pounds), jump rope 2 days a week, workout videos throughout the week  24-Hr Dietary Recall First Meal: 1/2 protein shake + boiled egg + keto toast Snack:  egg + cheese + wheat toast Second Meal 12:  tuna salad + keto crackers + fat free milk Snack: protein chips Third Meal: rotissiere chicken + broccoli + fries Snack: sugar free jello Beverages: water, water + flavoring, 24 ounces fat  free milk, sugar free propel  Post-Op Goals/ Signs/ Symptoms Using straws: no Drinking while eating: no Chewing/swallowing difficulties: no Changes in vision: no Changes to mood/headaches: no Hair loss/changes to skin/nails: no Difficulty focusing/concentrating: no Sweating: no Limb weakness: no Dizziness/lightheadedness: no Palpitations: no  Carbonated/caffeinated beverages: no N/V/D/C/Gas: no Abdominal pain: no Dumping syndrome: no    NUTRITION DIAGNOSIS  Overweight/obesity (Crum-3.3) related to past poor dietary habits and physical inactivity as evidenced by completed bariatric surgery and following dietary guidelines for continued weight loss and healthy nutrition status.     NUTRITION INTERVENTION: continued Nutrition counseling (C-1) and education (E-2) to facilitate bariatric surgery goals, including:  The importance of consuming adequate calories as well as certain nutrients daily due to the body's need for essential vitamins, minerals, and fats The importance of daily physical activity and to reach a goal of at least 150 minutes of moderate to vigorous physical activity weekly (or as directed by their physician) due to benefits such as increased musculature and improved lab values The importance of intuitive eating specifically learning hunger-satiety cues and understanding the importance of learning a new body: The importance of mindful eating to  avoid grazing behaviors  Encouraged patient to honor their body's internal hunger and fullness cues.  Throughout the day, check in mentally and rate hunger. Stop eating when satisfied not full regardless of how much food is left on the plate.  Get more if still hungry 20-30 minutes later.  The key is to honor satisfaction so throughout the meal, rate fullness factor and stop when comfortably satisfied not physically full. The key is to honor hunger and fullness without any feelings of guilt or shame.  Pay attention to what the internal  cues are, rather than any external factors. This will enhance the confidence you have in listening to your own body and following those internal cues enabling you to increase how often you eat when you are hungry not out of appetite and stop when you are satisfied not full.  Encouraged pt to continue to eat balanced meals inclusive of non starchy vegetables 2 times a day 7 days a week Encouraged pt to choose lean protein sources: limiting beef, pork, sausage, hotdogs, and lunch meat Encourage pt to choose healthy fats such as plant based limiting animal fats Encouraged pt to continue to drink a minium 64 fluid ounces with half being plain water to satisfy proper hydration   Handouts Previously Provided Include  Phase 7  Learning Style & Readiness for Change Teaching method utilized: Visual & Auditory  Demonstrated degree of understanding via: Teach Back  Readiness Level: action Barriers to learning/adherence to lifestyle change: none identified   RD's Notes for Next Visit Assess adherence to pt chosen goals    MONITORING & EVALUATION Dietary intake, weekly physical activity, body weight  Next Steps Patient is to follow-up in 3 months

## 2022-02-21 ENCOUNTER — Ambulatory Visit (INDEPENDENT_AMBULATORY_CARE_PROVIDER_SITE_OTHER): Payer: No Typology Code available for payment source | Admitting: Nurse Practitioner

## 2022-02-21 ENCOUNTER — Encounter: Payer: Self-pay | Admitting: Nurse Practitioner

## 2022-02-21 ENCOUNTER — Telehealth: Payer: Self-pay | Admitting: Nurse Practitioner

## 2022-02-21 VITALS — BP 162/88 | HR 92 | Temp 97.5°F | Ht 62.0 in | Wt 175.0 lb

## 2022-02-21 DIAGNOSIS — I10 Essential (primary) hypertension: Secondary | ICD-10-CM | POA: Diagnosis not present

## 2022-02-21 NOTE — Assessment & Plan Note (Signed)
Reports home BP:120s-130s/80s Compliant with medication (amlodipine '5mg'$ , coreg 3.'125mg'$ , and DASH diet No LE edema, no dizziness, no edema BP Readings from Last 3 Encounters:  02/21/22 (!) 162/88  01/23/22 (!) 180/100  01/16/22 (!) 166/74   Maintain current med doses Advised to send home BP reading and bring home BP machine to next appt. F/up in 28month

## 2022-02-21 NOTE — Progress Notes (Signed)
Established Patient Visit  Patient: Shelly Cohen   DOB: May 28, 1961   61 y.o. Female  MRN: [id number] Visit Date: 02/21/2022  Subjective:    Chief Complaint  Patient presents with   Office Visit    HTN F/u Checks BP daily  No concerns Requesting records for Eye exam     HPI HTN (hypertension), benign Reports home BP:120s-130s/80s Compliant with medication (amlodipine '5mg'$ , coreg 3.'125mg'$ , and DASH diet No LE edema, no dizziness, no edema BP Readings from Last 3 Encounters:  02/21/22 (!) 162/88  01/23/22 (!) 180/100  01/16/22 (!) 166/74   Maintain current med doses Advised to send home BP reading and bring home BP machine to next appt. F/up in 52month Wt Readings from Last 3 Encounters:  02/21/22 175 lb (79.4 kg)  02/20/22 174 lb 12.8 oz (79.3 kg)  01/23/22 179 lb 3.2 oz (81.3 kg)     Reviewed medical, surgical, and social history today  Medications: Outpatient Medications Prior to Visit  Medication Sig   amLODipine (NORVASC) 5 MG tablet Take 1 tablet (5 mg total) by mouth every evening.   BIOTIN PO Take 1 tablet by mouth daily.   CALCIUM-VITAMIN D PO Take 1 tablet by mouth daily.   carvedilol (COREG) 3.125 MG tablet Take 1 tablet by mouth 2 times daily.   docusate sodium (COLACE) 100 MG capsule Take 100 mg by mouth every other day.   fluticasone (FLONASE) 50 MCG/ACT nasal spray Place 1 spray into both nostrils daily as needed for allergies or rhinitis.   gabapentin (NEURONTIN) 100 MG capsule Take 1 capsule by mouth at bedtime.   KRILL OIL PO Take 1 capsule by mouth daily.   latanoprost (XALATAN) 0.005 % ophthalmic solution Place 1 drop into both eyes every evening   levothyroxine (SYNTHROID) 50 MCG tablet TAKE 1 TABLET BY MOUTH DAILY BEFORE BREAKFAST   MELATONIN PO Take 1 tablet by mouth at bedtime.   pantoprazole (PROTONIX) 40 MG tablet Take 1 tablet (40 mg total) by mouth daily.   Semaglutide-Weight Management (WEGOVY) 1.7 MG/0.75ML SOAJ Inject  0.75 mLs (1.7 mg total) into the skin once a week for 28 days   [DISCONTINUED] COVID-19 mRNA bivalent vaccine, Pfizer, (PFIZER COVID-19 VAC BIVALENT) injection Inject into the muscle. (Patient not taking: Reported on 02/21/2022)   [DISCONTINUED] methocarbamol (ROBAXIN) 500 MG tablet Take 1 tablet (500 mg total) by mouth every 8 (eight) hours as needed for muscle spasms. (Patient not taking: Reported on 02/21/2022)   [DISCONTINUED] Semaglutide-Weight Management (WEGOVY) 1 MG/0.5ML SOAJ Inject 1 mg total into the skin once a week for 28 days (Patient not taking: Reported on 02/21/2022)   No facility-administered medications prior to visit.   Reviewed past medical and social history.   ROS per HPI above      Objective:  BP (!) 162/88 (BP Location: Right Arm, Patient Position: Sitting, Cuff Size: Normal)   Pulse 92   Temp (!) 97.5 F (36.4 C) (Temporal)   Ht '5\' 2"'$  (1.575 m)   Wt 175 lb (79.4 kg)   SpO2 97%   BMI 32.01 kg/m      Physical Exam Cardiovascular:     Rate and Rhythm: Normal rate and regular rhythm.     Pulses: Normal pulses.  Pulmonary:     Effort: Pulmonary effort is normal.  Musculoskeletal:     Right lower leg: No edema.     Left lower leg: No  edema.  Neurological:     Mental Status: She is alert and oriented to person, place, and time.     No results found for any visits on 02/21/22.    Assessment & Plan:    Problem List Items Addressed This Visit       Cardiovascular and Mediastinum   HTN (hypertension), benign - Primary    Reports home BP:120s-130s/80s Compliant with medication (amlodipine '5mg'$ , coreg 3.'125mg'$ , and DASH diet No LE edema, no dizziness, no edema BP Readings from Last 3 Encounters:  02/21/22 (!) 162/88  01/23/22 (!) 180/100  01/16/22 (!) 166/74   Maintain current med doses Advised to send home BP reading and bring home BP machine to next appt. F/up in 42month     Return in about 4 weeks (around 03/21/2022) for HTN, Weight management,  hyperlipidemia, DM (discuss need for statin).     CWilfred Lacy NP

## 2022-02-21 NOTE — Patient Instructions (Signed)
Maintain current med doses Send home BP reading via mychart Bring BP machine to next appt

## 2022-02-23 ENCOUNTER — Encounter: Payer: Self-pay | Admitting: Nurse Practitioner

## 2022-02-23 NOTE — Telephone Encounter (Signed)
-----   Message from Flossie Buffy, NP sent at 02/21/2022 12:24 PM EDT ----- Regarding: BP readings Ask to provide home BP reading for the last 2days.

## 2022-02-23 NOTE — Telephone Encounter (Signed)
Pt advised.

## 2022-02-24 ENCOUNTER — Other Ambulatory Visit (HOSPITAL_COMMUNITY): Payer: Self-pay

## 2022-02-27 ENCOUNTER — Ambulatory Visit: Payer: No Typology Code available for payment source

## 2022-02-28 ENCOUNTER — Other Ambulatory Visit (HOSPITAL_COMMUNITY): Payer: Self-pay

## 2022-02-28 MED ORDER — WEGOVY 2.4 MG/0.75ML ~~LOC~~ SOAJ
SUBCUTANEOUS | 2 refills | Status: DC
  Filled 2022-02-28: qty 3, 28d supply, fill #0
  Filled 2022-03-26: qty 3, 28d supply, fill #1
  Filled 2022-04-25: qty 3, 28d supply, fill #2

## 2022-03-16 ENCOUNTER — Other Ambulatory Visit (HOSPITAL_COMMUNITY): Payer: Self-pay

## 2022-03-17 ENCOUNTER — Other Ambulatory Visit (HOSPITAL_COMMUNITY): Payer: Self-pay

## 2022-03-19 ENCOUNTER — Other Ambulatory Visit (HOSPITAL_COMMUNITY): Payer: Self-pay

## 2022-03-22 ENCOUNTER — Encounter: Payer: Self-pay | Admitting: Nurse Practitioner

## 2022-03-22 ENCOUNTER — Ambulatory Visit (INDEPENDENT_AMBULATORY_CARE_PROVIDER_SITE_OTHER): Payer: No Typology Code available for payment source | Admitting: Nurse Practitioner

## 2022-03-22 VITALS — BP 136/79 | HR 95 | Temp 97.0°F | Ht 62.0 in | Wt 175.0 lb

## 2022-03-22 DIAGNOSIS — I10 Essential (primary) hypertension: Secondary | ICD-10-CM | POA: Diagnosis not present

## 2022-03-22 DIAGNOSIS — M791 Myalgia, unspecified site: Secondary | ICD-10-CM | POA: Diagnosis not present

## 2022-03-22 LAB — CK: Total CK: 80 U/L (ref 7–177)

## 2022-03-22 LAB — SEDIMENTATION RATE: Sed Rate: 66 mm/hr — ABNORMAL HIGH (ref 0–30)

## 2022-03-22 LAB — C-REACTIVE PROTEIN: CRP: 2.7 mg/dL (ref 0.5–20.0)

## 2022-03-22 NOTE — Assessment & Plan Note (Addendum)
Home BP readings: 137/79, 168/90, 130/77, 140/86, 135/77, 140/83, 138/77, 128/75, 132/83, 123/66, 132/66, 132/90, 134/85. Average Home BP readings: 136/79 Repeated BP in office with home Machine: 180/97 (L) Repeat  Manual BP: 160/80 (R) BP Readings from Last 3 Encounters:  03/22/22 136/79  02/21/22 (!) 162/88  01/23/22 (!) 180/100   I think elevated BP is due to white coat syndrome. Maintain med doses Advised to continue BP check at home 2x/week and bring BP machine to each appt. F/up in 20month

## 2022-03-22 NOTE — Progress Notes (Signed)
Established Patient Visit  Patient: Shelly Cohen   DOB: 06/14/1961   61 y.o. Female  MRN: [id number] Visit Date: 03/22/2022  Subjective:    Chief Complaint  Patient presents with   Office Visit    HTN F/u Checks BP daily  C/o burning feeling in body ( thinks it could be shingles) Will be getting egg free flu shot next week    Muscle Pain This is a new problem. The current episode started more than 1 month ago. The problem occurs constantly. The problem has been waxing and waning since onset. The pain is present in the lower back, neck, upper back, left upper leg, left lower leg, right upper leg, right lower leg, right hip and left hip. The pain is medium. The symptoms are aggravated by inactivity. Associated symptoms include fatigue and sensory change. Pertinent negatives include no chest pain, fever, headaches, rash, swollen glands, visual change or weakness. (Burning sensation) Past treatments include acetaminophen. The treatment provided moderate relief. There is no swelling present. She has been Behaving normally. There is no history of chronic back pain, rheumatic disease or sickle cell disease.   HTN (hypertension), benign Home BP readings: 137/79, 168/90, 130/77, 140/86, 135/77, 140/83, 138/77, 128/75, 132/83, 123/66, 132/66, 132/90, 134/85. Average Home BP readings: 136/79 Repeated BP in office with home Machine: 180/97 (L) Repeat  Manual BP: 160/80 (R) BP Readings from Last 3 Encounters:  03/22/22 136/79  02/21/22 (!) 162/88  01/23/22 (!) 180/100   I think elevated BP is due to white coat syndrome. Maintain med doses Advised to continue BP check at home 2x/week and bring BP machine to each appt. F/up in 54month   Reviewed medical, surgical, and social history today  Medications: Outpatient Medications Prior to Visit  Medication Sig   amLODipine (NORVASC) 5 MG tablet Take 1 tablet (5 mg total) by mouth every evening.   BIOTIN PO Take 1 tablet by  mouth daily.   CALCIUM-VITAMIN D PO Take 1 tablet by mouth daily.   carvedilol (COREG) 3.125 MG tablet Take 1 tablet by mouth 2 times daily.   docusate sodium (COLACE) 100 MG capsule Take 100 mg by mouth every other day.   fluticasone (FLONASE) 50 MCG/ACT nasal spray Place 1 spray into both nostrils daily as needed for allergies or rhinitis.   gabapentin (NEURONTIN) 100 MG capsule Take 1 capsule by mouth at bedtime.   KRILL OIL PO Take 1 capsule by mouth daily.   latanoprost (XALATAN) 0.005 % ophthalmic solution Place 1 drop into both eyes every evening   levothyroxine (SYNTHROID) 50 MCG tablet TAKE 1 TABLET BY MOUTH DAILY BEFORE BREAKFAST   MELATONIN PO Take 1 tablet by mouth at bedtime.   pantoprazole (PROTONIX) 40 MG tablet Take 1 tablet (40 mg total) by mouth daily.   Semaglutide-Weight Management (WEGOVY) 2.4 MG/0.75ML SOAJ Inject 0.75 mLs (2.4 mg total) subcutaneously once a week for 84 days   [DISCONTINUED] Semaglutide-Weight Management (WEGOVY) 1.7 MG/0.75ML SOAJ Inject 0.75 mLs (1.7 mg total) into the skin once a week for 28 days (Patient not taking: Reported on 03/22/2022)   No facility-administered medications prior to visit.   Reviewed past medical and social history.   ROS per HPI above  Last CBC Lab Results  Component Value Date   WBC 7.7 01/16/2022   HGB 12.4 01/16/2022   HCT 40.1 01/16/2022   MCV 82.7 01/16/2022   MCH 25.6 (L) 01/16/2022  RDW 15.1 01/16/2022   PLT 239 78/93/8101   Last metabolic panel Lab Results  Component Value Date   GLUCOSE 94 01/16/2022   NA 140 01/16/2022   K 4.0 01/16/2022   CL 108 01/16/2022   CO2 24 01/16/2022   BUN 18 01/16/2022   CREATININE 0.93 01/16/2022   GFRNONAA >60 01/16/2022   CALCIUM 9.4 01/16/2022   PROT 7.6 01/16/2022   ALBUMIN 3.8 01/16/2022   BILITOT 0.6 01/16/2022   ALKPHOS 55 01/16/2022   AST 17 01/16/2022   ALT 13 01/16/2022   ANIONGAP 8 01/16/2022   Last hemoglobin A1c Lab Results  Component Value Date    HGBA1C 6.0 01/23/2022   Last thyroid functions Lab Results  Component Value Date   TSH 2.75 01/23/2022   T4TOTAL 9.9 02/27/2021   Last vitamin D Lab Results  Component Value Date   VD25OH 45.68 04/04/2021   Last vitamin B12 and Folate Lab Results  Component Value Date   VITAMINB12 >1550 (H) 04/04/2021   FOLATE >23.4 04/04/2021        Objective:  BP 136/79 Comment: average home BP readings in last 13days  Pulse 95   Temp (!) 97 F (36.1 C) (Temporal)   Ht '5\' 2"'$  (1.575 m)   Wt 175 lb (79.4 kg)   SpO2 98%   BMI 32.01 kg/m      Physical Exam Cardiovascular:     Rate and Rhythm: Normal rate and regular rhythm.     Pulses: Normal pulses.     Heart sounds: Normal heart sounds.  Pulmonary:     Effort: Pulmonary effort is normal.     Breath sounds: Normal breath sounds.  Musculoskeletal:        General: Normal range of motion.     Right lower leg: No edema.     Left lower leg: No edema.  Skin:    General: Skin is warm and dry.     Findings: No rash.  Neurological:     Mental Status: She is alert and oriented to person, place, and time.     No results found for any visits on 03/22/22.    Assessment & Plan:    Problem List Items Addressed This Visit       Cardiovascular and Mediastinum   HTN (hypertension), benign    Home BP readings: 137/79, 168/90, 130/77, 140/86, 135/77, 140/83, 138/77, 128/75, 132/83, 123/66, 132/66, 132/90, 134/85. Average Home BP readings: 136/79 Repeated BP in office with home Machine: 180/97 (L) Repeat  Manual BP: 160/80 (R) BP Readings from Last 3 Encounters:  03/22/22 136/79  02/21/22 (!) 162/88  01/23/22 (!) 180/100   I think elevated BP is due to white coat syndrome. Maintain med doses Advised to continue BP check at home 2x/week and bring BP machine to each appt. F/up in 38month      White coat syndrome with diagnosis of hypertension   Other Visit Diagnoses     Myalgia    -  Primary   Relevant Orders   ANA w/Reflex    Sedimentation rate   CK   C-reactive protein      Return in about 3 months (around 06/21/2022) for CPE (fasting).     CWilfred Lacy NP

## 2022-03-22 NOTE — Patient Instructions (Addendum)
Maintain current medication Go to lab  Continue to monitor BP 2x/week Bring readings and machine to next appt

## 2022-03-23 ENCOUNTER — Other Ambulatory Visit (HOSPITAL_COMMUNITY): Payer: Self-pay

## 2022-03-23 LAB — ANA W/REFLEX: Anti Nuclear Antibody (ANA): NEGATIVE

## 2022-03-26 ENCOUNTER — Other Ambulatory Visit (HOSPITAL_COMMUNITY): Payer: Self-pay

## 2022-03-27 ENCOUNTER — Other Ambulatory Visit (HOSPITAL_COMMUNITY): Payer: Self-pay

## 2022-04-03 ENCOUNTER — Encounter: Payer: Self-pay | Admitting: Nurse Practitioner

## 2022-04-05 ENCOUNTER — Ambulatory Visit (INDEPENDENT_AMBULATORY_CARE_PROVIDER_SITE_OTHER): Payer: No Typology Code available for payment source | Admitting: Nurse Practitioner

## 2022-04-05 ENCOUNTER — Other Ambulatory Visit (HOSPITAL_COMMUNITY): Payer: Self-pay

## 2022-04-05 ENCOUNTER — Encounter: Payer: Self-pay | Admitting: Nurse Practitioner

## 2022-04-05 VITALS — BP 138/82 | HR 82 | Temp 96.9°F | Ht 62.0 in | Wt 175.6 lb

## 2022-04-05 DIAGNOSIS — Z96641 Presence of right artificial hip joint: Secondary | ICD-10-CM | POA: Diagnosis not present

## 2022-04-05 DIAGNOSIS — M791 Myalgia, unspecified site: Secondary | ICD-10-CM | POA: Diagnosis not present

## 2022-04-05 DIAGNOSIS — Z780 Asymptomatic menopausal state: Secondary | ICD-10-CM

## 2022-04-05 DIAGNOSIS — M25551 Pain in right hip: Secondary | ICD-10-CM

## 2022-04-05 DIAGNOSIS — G8929 Other chronic pain: Secondary | ICD-10-CM | POA: Diagnosis not present

## 2022-04-05 DIAGNOSIS — Z0001 Encounter for general adult medical examination with abnormal findings: Secondary | ICD-10-CM | POA: Diagnosis not present

## 2022-04-05 DIAGNOSIS — R202 Paresthesia of skin: Secondary | ICD-10-CM

## 2022-04-05 DIAGNOSIS — R200 Anesthesia of skin: Secondary | ICD-10-CM | POA: Diagnosis not present

## 2022-04-05 DIAGNOSIS — M7918 Myalgia, other site: Secondary | ICD-10-CM | POA: Insufficient documentation

## 2022-04-05 DIAGNOSIS — T466X5A Adverse effect of antihyperlipidemic and antiarteriosclerotic drugs, initial encounter: Secondary | ICD-10-CM | POA: Insufficient documentation

## 2022-04-05 LAB — B12 AND FOLATE PANEL
Folate: >23.9 ng/mL (ref >5.9)
Vitamin B-12: >1500 pg/mL — ABNORMAL HIGH (ref 211–911)

## 2022-04-05 LAB — VITAMIN D 25 HYDROXY (VIT D DEFICIENCY, FRACTURES): VITD: 51.93 ng/mL (ref 30.00–100.00)

## 2022-04-05 MED ORDER — PANTOPRAZOLE SODIUM 40 MG PO TBEC
DELAYED_RELEASE_TABLET | ORAL | 0 refills | Status: DC
Start: 2022-04-05 — End: 2022-07-05
  Filled 2022-04-05: qty 90, 90d supply, fill #0

## 2022-04-05 MED ORDER — GABAPENTIN 100 MG PO CAPS
ORAL_CAPSULE | ORAL | 11 refills | Status: DC
  Filled 2022-04-05: qty 90, fill #0
  Filled 2022-04-18: qty 90, 32d supply, fill #0
  Filled ????-??-??: fill #0

## 2022-04-05 NOTE — Patient Instructions (Signed)
If gabapentin does not help. I will need to enter referral to rheumatology. Schedule appt with ortho  go to lab

## 2022-04-05 NOTE — Progress Notes (Signed)
Complete physical exam  Patient: Shelly Cohen   DOB: [birth year]   61 y.o. Female  MRN: [id number] Visit Date: 04/05/2022  Subjective:    Chief Complaint  Patient presents with   Annual Exam    Physical Discuss Blood work    Shelly Cohen is a 61 y.o. female who presents today for a complete physical exam. She reports consuming a low fat and low sodium diet.  Walking 62mns daily  She generally feels well. She reports sleeping well. She does have additional problems to discuss today.  Vision:Yes Dental:Yes STD Screen:No  Postmenopausal since 2012. No previous fracture. Daily walking for exercise but limited due to R. Hip pain, take daily calcium and vit. D in MVI. She agreed to dexa scan order.  Wt Readings from Last 3 Encounters:  04/05/22 175 lb 9.6 oz (79.7 kg)  03/22/22 175 lb (79.4 kg)  02/21/22 175 lb (79.4 kg)    Most recent fall risk assessment:    02/27/2021   11:19 AM  [place]  Number falls in past yr: 0  Injury with Fall? 0   Most recent depression screenings:    04/05/2022    8:56 AM 08/14/2021    8:04 AM  PHQ 2/9 Scores  PHQ - 2 Score 0 0  PHQ- 9 Score 0    HPI  Chronic hip pain after total replacement of right hip joint Limited ROM Pain worse at hs Minimal improvement with tylenol '650mg'$  BID Advised to schedule appt with ortho Add ibuprofen '200mg'$  in PM  Myalgia Onset 261monthago, worse at bedtime. muscle ache and tingling in arms, legs and back. Associated with fatigue No fever, no night sweats, no URI, no change in GI/GU function, no anxiety or depression. Has adequate sleep Denies any new medication, no use of statin drug Recent labs: cbc, cmp, tsh,    Past Medical History:  Diagnosis Date   Alpha thalassemia trait    Arthritis    hips   Fibroadenoma    Glaucoma    monitored every 42m24monthright eye worse than left    History of positive PPD, treatment status unknown    Hypertension    OSA  (obstructive sleep apnea)    mild with AHI 8/hr   Pre-diabetes    Thyroid disease    Past Surgical History:  Procedure Laterality Date   ABDOMINAL HYSTERECTOMY  2013   BREAST BIOPSY  2013   BUNIONECTOMY  10/2014   CHOLECYSTECTOMY  2015   COLONOSCOPY     hallux limi Right 2019   great  toe revision   TOTAL HIP ARTHROPLASTY Right 09/12/2018   Procedure: RIGHT TOTAL HIP ARTHROPLASTY ANTERIOR APPROACH;  Surgeon: BlaMcarthur RossettiD;  Location: [facility];  Service: Orthopedics;  Laterality: Right;   TUBAL LIGATION  1990   UPPER GI ENDOSCOPY N/A 02/20/2021   Procedure: UPPER GI ENDOSCOPY;  Surgeon: WilGreer PickerelD;  Location: [facility];  Service: General;  Laterality: N/A;   Social History   Socioeconomic History   Marital status: Married    Spouse name: Not on file   Number of children: Not on file   Years of education: Not on file   Highest education level: Not on file  Occupational History   Not on file  Tobacco Use   Smoking status: Never   Smokeless tobacco: Never  Vaping Use   Vaping Use: Never used  Substance and Sexual Activity   Alcohol  use: No   Drug use: No   Sexual activity: Yes    Birth control/protection: Surgical  Other Topics Concern   Not on file  Social History Narrative   Not on file   Social Determinants of Health   Financial Resource Strain: Not on file  Food Insecurity: Not on file  Transportation Needs: Not on file  Physical Activity: Not on file  Stress: Not on file  Social Connections: Not on file  Intimate Partner Violence: Not on file   Family Status  Relation Name Status   Mother  Deceased   Father  Alive   Sister  Alive   Brother  Deceased   MGM  (Not Specified)   MGF  (Not Specified)   Mat Aunt  Alive   Family History  Problem Relation Age of Onset   Hypertension Mother    Anemia Mother    Arthritis Mother        rheumatoid arthritis   Kidney disease Mother    Cataracts Mother    Heart disease Mother    Hypertension  Sister    Cataracts Sister    Hypertension Brother    Glaucoma Brother    Diabetes Brother    Cancer Maternal Grandmother        leukemia   Cancer Maternal Grandfather        lung   Glaucoma Maternal Aunt    Allergies  Allergen Reactions   Dilaudid [Hydromorphone Hcl] Itching   Diphenhydramine Hcl Itching    hallucination    Hydrocodone Nausea Only   Oxycodone Nausea And Vomiting   Azithromycin Itching and Rash   Cephalexin Rash   Penicillin G Rash    Did it involve swelling of the face/tongue/throat, SOB, or low BP? No Did it involve sudden or severe rash/hives, skin peeling, or any reaction on the inside of your mouth or nose? No Did you need to seek medical attention at a hospital or doctor's office? No When did it last happen?      childhood If all above answers are "NO", may proceed with cephalosporin use.    Zolpidem Itching and Rash    Patient Care Team: Adalis Gatti, Charlene Brooke, NP as PCP - General (Internal Medicine) Sueanne Margarita, MD as PCP - [facility] [place]   Medications: Outpatient Medications Prior to Visit  Medication Sig   amLODipine (NORVASC) 5 MG tablet Take 1 tablet (5 mg total) by mouth every evening.   BIOTIN PO Take 1 tablet by mouth daily.   CALCIUM-VITAMIN D PO Take 1 tablet by mouth daily.   carvedilol (COREG) 3.125 MG tablet Take 1 tablet by mouth 2 times daily.   docusate sodium (COLACE) 100 MG capsule Take 100 mg by mouth every other day.   fluticasone (FLONASE) 50 MCG/ACT nasal spray Place 1 spray into both nostrils daily as needed for allergies or rhinitis.   KRILL OIL PO Take 1 capsule by mouth daily.   latanoprost (XALATAN) 0.005 % ophthalmic solution Place 1 drop into both eyes every evening   levothyroxine (SYNTHROID) 50 MCG tablet TAKE 1 TABLET BY MOUTH DAILY BEFORE BREAKFAST   MELATONIN PO Take 1 tablet by mouth at bedtime.   pantoprazole (PROTONIX) 40 MG tablet Take 1 tablet (40 mg total)  by mouth daily.   Semaglutide-Weight Management (WEGOVY) 2.4 MG/0.75ML SOAJ Inject 0.75 mLs (2.4 mg total) subcutaneously once a week for 84 days   [DISCONTINUED] gabapentin (NEURONTIN) 100 MG capsule Take 1  capsule by mouth at bedtime.   No facility-administered medications prior to visit.    Review of Systems  Constitutional:  Negative for chills, diaphoresis, fatigue, fever and unexpected weight change.  HENT:  Negative for congestion and sore throat.   Eyes:        Negative for visual changes  Respiratory:  Negative for apnea, cough and shortness of breath.   Cardiovascular:  Negative for chest pain, palpitations and leg swelling.  Gastrointestinal:  Negative for blood in stool, constipation and diarrhea.  Endocrine: Negative.   Genitourinary:  Negative for dysuria, frequency and urgency.  Musculoskeletal:  Positive for arthralgias and myalgias. Negative for back pain, gait problem and joint swelling.  Skin:  Negative for rash.  Neurological:  Negative for dizziness and headaches.  Hematological:  Does not bruise/bleed easily.  Psychiatric/Behavioral:  Positive for sleep disturbance. Negative for behavioral problems, dysphoric mood, self-injury and suicidal ideas. The patient is not nervous/anxious.         Objective:  BP 138/82 (BP Location: Right Arm, Patient Position: Sitting, Cuff Size: Normal)   Pulse 82   Temp (!) 96.9 F (36.1 C) (Temporal)   Ht '5\' 2"'$  (1.575 m)   Wt 175 lb 9.6 oz (79.7 kg)   SpO2 98%   BMI 32.12 kg/m       Physical Exam Vitals reviewed.  Constitutional:      General: She is not in acute distress.    Appearance: She is well-developed.  HENT:     Right Ear: Tympanic membrane, ear canal and external ear normal.     Left Ear: Tympanic membrane, ear canal and external ear normal.     Nose: Nose normal.  Eyes:     Extraocular Movements: Extraocular movements intact.     Conjunctiva/sclera: Conjunctivae normal.     Pupils: Pupils are equal,  round, and reactive to light.  Neck:     Thyroid: No thyroid mass, thyromegaly or thyroid tenderness.  Cardiovascular:     Rate and Rhythm: Normal rate and regular rhythm.     Pulses: Normal pulses.     Heart sounds: Normal heart sounds.  Pulmonary:     Effort: Pulmonary effort is normal. No respiratory distress.     Breath sounds: Normal breath sounds.  Chest:     Chest wall: No tenderness.  Abdominal:     General: Bowel sounds are normal.     Palpations: Abdomen is soft.  Musculoskeletal:        General: Tenderness present. No swelling or deformity.     Cervical back: Normal, normal range of motion and neck supple.     Thoracic back: Normal.     Lumbar back: Normal.     Right hip: Tenderness present. No crepitus. Decreased range of motion. Normal strength.     Left hip: Normal.     Right upper leg: Normal.     Left upper leg: Normal.     Right knee: Normal.     Left knee: Normal.     Right lower leg: Normal. No edema.     Left lower leg: Normal. No edema.  Lymphadenopathy:     Cervical: No cervical adenopathy.  Neurological:     Mental Status: She is alert and oriented to person, place, and time.     Deep Tendon Reflexes: Reflexes are normal and symmetric.     No results found for any visits on 04/05/22.    Assessment & Plan:    Routine Health Maintenance and  Physical Exam  Immunization History  Administered Date(s) Administered   Influenza-Unspecified 03/19/2016, 03/19/2018, 03/24/2019, 03/22/2020, 03/24/2021, 03/24/2022   PFIZER(Purple Top)SARS-COV-2 Vaccination 06/30/2019, 07/21/2019, 04/01/2020, 10/21/2020   Pfizer Covid-19 Vaccine Bivalent Booster 53yr & up 04/12/2021   Tdap 12/15/2015, 12/26/2017   Zoster Recombinat (Shingrix) 08/11/2019, 10/20/2019    Health Maintenance  Topic Date Due   HEMOGLOBIN A1C  07/26/2022   Diabetic kidney evaluation - Urine ACR  08/14/2022   COLONOSCOPY (Pts [id number] coverage will need to be confirmed)  09/20/2022    OPHTHALMOLOGY EXAM  11/09/2022   Diabetic kidney evaluation - GFR measurement  01/17/2023   FOOT EXAM  01/24/2023   MAMMOGRAM  12/05/2023   TETANUS/TDAP  12/27/2027   INFLUENZA VACCINE  Completed   COVID-19 Vaccine  Completed   Hepatitis C Screening  Completed   HIV Screening  Completed   Zoster Vaccines- Shingrix  Completed   HPV VACCINES  Aged Out   PAP SMEAR-Modifier  Discontinued   Discussed health benefits of physical activity, and encouraged her to engage in regular exercise appropriate for her age and condition.  Problem List Items Addressed This Visit       Other   Chronic hip pain after total replacement of right hip joint    Limited ROM Pain worse at hs Minimal improvement with tylenol '650mg'$  BID Advised to schedule appt with ortho Add ibuprofen '200mg'$  in PM      Relevant Medications   gabapentin (NEURONTIN) 100 MG capsule   Myalgia    Onset 43m88monthgo, worse at bedtime. muscle ache and tingling in arms, legs and back. Associated with fatigue No fever, no night sweats, no URI, no change in GI/GU function, no anxiety or depression. Has adequate sleep Denies any new medication, no use of statin drug Recent labs: cbc, cmp, tsh,        Relevant Orders   Vitamin D (25 hydroxy)   B12 and Folate Panel   Numbness and tingling of foot   Relevant Medications   gabapentin (NEURONTIN) 100 MG capsule   Other Visit Diagnoses     Encounter for preventative adult health care exam with abnormal findings    -  Primary   Asymptomatic age-related postmenopausal state       Relevant Orders   DG Bone Density      Return in about 6 months (around 10/05/2022) for DM, Hypothyroidism, hyperlipidemia (fasting).     ChaWilfred LacyP

## 2022-04-05 NOTE — Assessment & Plan Note (Addendum)
Onset 79month ago, worse at bedtime. muscle ache and tingling in arms, legs and back. Associated with fatigue No fever, no night sweats, no URI, no change in GI/GU function, no anxiety or depression. Has adequate sleep Denies any new medication, no use of statin drug Recent labs: cbc, cmp, tsh, ESR, CR, CK, ANA: normal. With hx of gastrrectomy, will check vit. D, B12 and folate. Advised to increase gabapentin to 3060mat hs Ref to rheumatology if no improvement.

## 2022-04-05 NOTE — Assessment & Plan Note (Signed)
Limited ROM Pain worse at hs Minimal improvement with tylenol '650mg'$  BID Advised to schedule appt with ortho Add ibuprofen '200mg'$  in PM

## 2022-04-06 ENCOUNTER — Other Ambulatory Visit (HOSPITAL_COMMUNITY): Payer: Self-pay

## 2022-04-18 ENCOUNTER — Other Ambulatory Visit (HOSPITAL_COMMUNITY): Payer: Self-pay

## 2022-04-26 ENCOUNTER — Other Ambulatory Visit (HOSPITAL_COMMUNITY): Payer: Self-pay

## 2022-04-26 ENCOUNTER — Encounter: Payer: Self-pay | Admitting: Nurse Practitioner

## 2022-04-26 DIAGNOSIS — R200 Anesthesia of skin: Secondary | ICD-10-CM

## 2022-04-26 MED ORDER — GABAPENTIN 100 MG PO CAPS
100.0000 mg | ORAL_CAPSULE | Freq: Three times a day (TID) | ORAL | 3 refills | Status: DC
  Filled 2022-04-26 – 2022-04-27 (×4): qty 270, 90d supply, fill #0
  Filled 2022-07-24: qty 270, 90d supply, fill #1
  Filled 2022-10-25: qty 270, 90d supply, fill #2
  Filled 2023-01-21: qty 270, 90d supply, fill #3

## 2022-04-27 ENCOUNTER — Other Ambulatory Visit (HOSPITAL_COMMUNITY): Payer: Self-pay

## 2022-05-02 ENCOUNTER — Other Ambulatory Visit (HOSPITAL_COMMUNITY): Payer: Self-pay

## 2022-05-02 MED ORDER — WEGOVY 2.4 MG/0.75ML ~~LOC~~ SOAJ
2.4000 mg | SUBCUTANEOUS | 2 refills | Status: DC
  Filled 2022-05-02 – 2022-05-25 (×2): qty 3, 28d supply, fill #0
  Filled 2022-06-19: qty 3, 28d supply, fill #1
  Filled 2022-07-16: qty 3, 28d supply, fill #2

## 2022-05-08 ENCOUNTER — Ambulatory Visit: Payer: No Typology Code available for payment source | Admitting: Skilled Nursing Facility1

## 2022-05-10 ENCOUNTER — Other Ambulatory Visit (HOSPITAL_COMMUNITY): Payer: Self-pay

## 2022-05-11 ENCOUNTER — Other Ambulatory Visit (HOSPITAL_COMMUNITY): Payer: Self-pay

## 2022-05-15 ENCOUNTER — Encounter: Payer: No Typology Code available for payment source | Attending: General Surgery | Admitting: Skilled Nursing Facility1

## 2022-05-15 ENCOUNTER — Encounter: Payer: Self-pay | Admitting: Skilled Nursing Facility1

## 2022-05-15 VITALS — Ht 62.0 in | Wt 168.3 lb

## 2022-05-15 DIAGNOSIS — E669 Obesity, unspecified: Secondary | ICD-10-CM | POA: Diagnosis not present

## 2022-05-15 NOTE — Progress Notes (Signed)
Bariatric Nutrition Follow-Up Visit Medical Nutrition Therapy    Sleeve gastrectomy 02/20/2021  NUTRITION ASSESSMENT     Anthropometrics  Start weight at [facility]: 219.2 lbs (date: 09/21/2020)  Weight: 168.3 pounds   Clinical  Medical hx: HTN, DM Medications: see list; Wegovy Labs: WNL Notable signs/symptoms: stiff hip Any previous deficiencies? No   Body Composition Scale 04/20/2021 07/03/2021 09/07/2021 02/20/2022 05/15/2022  Current Body Weight 185 182.1 180.3 174.8 168.3  Total Body Fat % 40.6 40.4 40.1 39.2 38  Visceral Fat '13 13 12 12 11  '$ Fat-Free Mass % 59.3 59.5 59.8 60.7 61.9   Total Body Water % 44.1 44.2 44.4 44.8 45.4  Muscle-Mass lbs 27.7 27.5 27.4 27.4 27.3  BMI 33.6 33.1 32.8 31.8 30.5  Body Fat Displacement              Torso  lbs 46.5 45.5 44.7 42.4 39.5         Left Leg  lbs 9.3 9.1 8.9 8.4 7.9         Right Leg  lbs 9.3 9.1 8.9 8.4 7.9         Left Arm  lbs 4.6 4.5 4.4 4.2 3.9         Right Arm   lbs 4.6 4.5 4.4 4.2 3.9     Lifestyle & Dietary Hx  Pt states she does not check her blood sugars.   Pt states her BP has been great and a lower stress level with doing therapy during her divorce.  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  Pt states she takes this seriously and ensures she eats often throughout today so she does not accidentally under-eat.    Estimated daily fluid intake: 64+ oz Estimated daily protein intake: 80+ g Supplements: multi and calcium Current average weekly physical activity: walking 3 days a week 30 minutes, treadmill at home; 10 pound weights (increased from 5 pounds), jump rope 2 days a week, workout videos throughout the week  24-Hr Dietary Recall: cut back to one protein shake a week First Meal: 1 egg with cheese + 1 piece wheat toast + 8oz fat free skim milk (fairlife) or meatless sasuage + 1 toast + oatmeal Snack:  1 apple or 6 grapes Second Meal 12:  4 ounce tuna salad  + 4 wheat crackers or 4 ounces  homeade chili + beans + 4 wheat crackers 2 cheese Snack: 4 cheese cubes or velvita crackers  Third Meal: 4 ounce salmon + 1/3 cup [place] or 4 ounces veg soup + cheese toast Snack: 1/2 protein shake rarely  Beverages: water, water + flavoring, 24 ounces fat free milk, sugar free propel  Post-Op Goals/ Signs/ Symptoms Using straws: no Drinking while eating: no Chewing/swallowing difficulties: no Changes in vision: no Changes to mood/headaches: no Hair loss/changes to skin/nails: no Difficulty focusing/concentrating: no Sweating: no Limb weakness: no Dizziness/lightheadedness: no Palpitations: no  Carbonated/caffeinated beverages: no N/V/D/C/Gas: no Abdominal pain: no Dumping syndrome: no    NUTRITION DIAGNOSIS  Overweight/obesity ([place]-3.3) related to past poor dietary habits and physical inactivity as evidenced by completed bariatric surgery and following dietary guidelines for continued weight loss and healthy nutrition status.     NUTRITION INTERVENTION: continued Nutrition counseling (C-1) and education (E-2) to facilitate bariatric surgery goals, including:  The importance of consuming adequate calories as well as certain nutrients daily due to the body's need for essential vitamins, minerals, and fats The importance of daily physical activity and to reach a  goal of at least 150 minutes of moderate to vigorous physical activity weekly (or as directed by their physician) due to benefits such as increased musculature and improved lab values The importance of intuitive eating specifically learning hunger-satiety cues and understanding the importance of learning a new body: The importance of mindful eating to avoid grazing behaviors  Encouraged patient to honor their body's internal hunger and fullness cues.  Throughout the day, check in mentally and rate hunger. Stop eating when satisfied not full regardless of how much food is left on the plate.  Get more if still hungry 20-30  minutes later.  The key is to honor satisfaction so throughout the meal, rate fullness factor and stop when comfortably satisfied not physically full. The key is to honor hunger and fullness without any feelings of guilt or shame.  Pay attention to what the internal cues are, rather than any external factors. This will enhance the confidence you have in listening to your own body and following those internal cues enabling you to increase how often you eat when you are hungry not out of appetite and stop when you are satisfied not full.  Encouraged pt to continue to eat balanced meals inclusive of non starchy vegetables 2 times a day 7 days a week Encouraged pt to choose lean protein sources: limiting beef, pork, sausage, hotdogs, and lunch meat Encourage pt to choose healthy fats such as plant based limiting animal fats Encouraged pt to continue to drink a minium 64 fluid ounces with half being plain water to satisfy proper hydration   Handouts Previously Provided Include  Phase 7  Learning Style & Readiness for Change Teaching method utilized: Visual & Auditory  Demonstrated degree of understanding via: Teach Back  Readiness Level: action Barriers to learning/adherence to lifestyle change: none identified   RD's Notes for Next Visit Assess adherence to pt chosen goals    MONITORING & EVALUATION Dietary intake, weekly physical activity, body weight  Next Steps Patient is to follow-up in 6 months

## 2022-05-21 ENCOUNTER — Encounter: Payer: Self-pay | Admitting: Nurse Practitioner

## 2022-05-21 DIAGNOSIS — J209 Acute bronchitis, unspecified: Secondary | ICD-10-CM

## 2022-05-22 ENCOUNTER — Other Ambulatory Visit (HOSPITAL_COMMUNITY): Payer: Self-pay

## 2022-05-22 MED ORDER — PROMETHAZINE-DM 6.25-15 MG/5ML PO SYRP
5.0000 mL | ORAL_SOLUTION | Freq: Four times a day (QID) | ORAL | 0 refills | Status: DC | PRN
  Filled 2022-05-22: qty 180, 9d supply, fill #0

## 2022-05-22 MED ORDER — PREDNISONE 20 MG PO TABS
ORAL_TABLET | ORAL | 0 refills | Status: AC
  Filled 2022-05-22: qty 4, 3d supply, fill #0

## 2022-05-25 ENCOUNTER — Other Ambulatory Visit (HOSPITAL_COMMUNITY): Payer: Self-pay

## 2022-06-07 ENCOUNTER — Other Ambulatory Visit (HOSPITAL_COMMUNITY): Payer: Self-pay

## 2022-06-07 ENCOUNTER — Ambulatory Visit (INDEPENDENT_AMBULATORY_CARE_PROVIDER_SITE_OTHER): Payer: No Typology Code available for payment source | Admitting: Nurse Practitioner

## 2022-06-07 ENCOUNTER — Ambulatory Visit (INDEPENDENT_AMBULATORY_CARE_PROVIDER_SITE_OTHER)
Admission: RE | Admit: 2022-06-07 | Discharge: 2022-06-07 | Disposition: A | Discharge status: Home / Self Care | Payer: No Typology Code available for payment source | Source: Ambulatory Visit | Attending: Nurse Practitioner | Admitting: Nurse Practitioner

## 2022-06-07 ENCOUNTER — Encounter: Payer: Self-pay | Admitting: Nurse Practitioner

## 2022-06-07 VITALS — BP 140/82 | HR 98 | Temp 97.1°F | Ht 62.0 in | Wt 170.0 lb

## 2022-06-07 DIAGNOSIS — R509 Fever, unspecified: Secondary | ICD-10-CM | POA: Diagnosis not present

## 2022-06-07 DIAGNOSIS — R051 Acute cough: Secondary | ICD-10-CM

## 2022-06-07 LAB — POCT INFLUENZA A/B
Influenza A, POC: NEGATIVE
Influenza B, POC: NEGATIVE

## 2022-06-07 LAB — POC COVID19 BINAXNOW: SARS Coronavirus 2 Ag: NEGATIVE

## 2022-06-07 MED ORDER — LEVOFLOXACIN 500 MG PO TABS
500.0000 mg | ORAL_TABLET | Freq: Every day | ORAL | 0 refills | Status: DC
  Filled 2022-06-07: qty 7, 7d supply, fill #0

## 2022-06-07 MED ORDER — PROMETHAZINE-DM 6.25-15 MG/5ML PO SYRP
5.0000 mL | ORAL_SOLUTION | Freq: Four times a day (QID) | ORAL | 0 refills | Status: DC | PRN
  Filled 2022-06-07: qty 180, 9d supply, fill #0

## 2022-06-07 MED ORDER — ALBUTEROL SULFATE HFA 108 (90 BASE) MCG/ACT IN AERS
1.0000 | INHALATION_SPRAY | Freq: Four times a day (QID) | RESPIRATORY_TRACT | 0 refills | Status: AC | PRN
  Filled 2022-06-07: qty 6.7, 25d supply, fill #0

## 2022-06-07 NOTE — Progress Notes (Signed)
Established Patient Visit  Patient: Shelly Cohen   DOB: 10/20/1960   61 y.o. Female  MRN: [id number] Visit Date: 06/07/2022  Subjective:    Chief Complaint  Patient presents with   Acute Visit    C/o chest congestion, sore throat & cough with phlegm, fever last night. This happened before & now it is back   Nx covid test 06/06/2022   Cough This is a new problem. The current episode started 1 to 4 weeks ago. The problem has been unchanged. The cough is Non-productive. Associated symptoms include chills, a fever, myalgias, rhinorrhea, shortness of breath and wheezing. Pertinent negatives include no chest pain, ear congestion, ear pain, headaches, heartburn, hemoptysis, nasal congestion, postnasal drip, rash, sore throat, sweats or weight loss. The symptoms are aggravated by lying down and cold air. She has tried prescription cough suppressant and oral steroids for the symptoms. The treatment provided mild relief. Her past medical history is significant for bronchitis and environmental allergies.   Reviewed medical, surgical, and social history today  Medications: Outpatient Medications Prior to Visit  Medication Sig   amLODipine (NORVASC) 5 MG tablet Take 1 tablet (5 mg total) by mouth every evening.   BIOTIN PO Take 1 tablet by mouth daily.   CALCIUM-VITAMIN D PO Take 1 tablet by mouth daily.   carvedilol (COREG) 3.125 MG tablet Take 1 tablet by mouth 2 times daily.   docusate sodium (COLACE) 100 MG capsule Take 100 mg by mouth every other day.   fluticasone (FLONASE) 50 MCG/ACT nasal spray Place 1 spray into both nostrils daily as needed for allergies or rhinitis.   gabapentin (NEURONTIN) 100 MG capsule Take 1 capsule (100 mg total) by mouth 3 (three) times daily.   KRILL OIL PO Take 1 capsule by mouth daily.   latanoprost (XALATAN) 0.005 % ophthalmic solution Place 1 drop into both eyes every evening   levothyroxine (SYNTHROID) 50 MCG tablet TAKE 1 TABLET BY MOUTH  DAILY BEFORE BREAKFAST   MELATONIN PO Take 1 tablet by mouth at bedtime.   pantoprazole (PROTONIX) 40 MG tablet Take 1 tablet (40 mg total) by mouth daily.   Semaglutide-Weight Management (WEGOVY) 2.4 MG/0.75ML SOAJ Inject 2.4 mg into the skin once a week.   pantoprazole (PROTONIX) 40 MG tablet Take 1 tablet (40 mg total) by mouth once daily (Patient not taking: Reported on 06/07/2022)   [DISCONTINUED] promethazine-dextromethorphan (PROMETHAZINE-DM) 6.25-15 MG/5ML syrup Take 5 mLs by mouth 4 (four) times daily as needed for cough. (Patient not taking: Reported on 06/07/2022)   No facility-administered medications prior to visit.   Reviewed past medical and social history.   ROS per HPI above      Objective:  BP (!) 140/82   Pulse 98   Temp (!) 97.1 F (36.2 C) (Temporal)   Ht '5\' 2"'$  (1.575 m)   Wt 170 lb (77.1 kg)   SpO2 99%   BMI 31.09 kg/m      Physical Exam Vitals reviewed.  Constitutional:      General: She is not in acute distress. Cardiovascular:     Rate and Rhythm: Normal rate and regular rhythm.     Pulses: Normal pulses.     Heart sounds: Normal heart sounds.  Pulmonary:     Effort: Pulmonary effort is normal.     Breath sounds: Normal breath sounds.  Musculoskeletal:     Right lower leg: No edema.  Left lower leg: No edema.  Neurological:     Mental Status: She is alert and oriented to person, place, and time.     Results for orders placed or performed in visit on 06/07/22  POC COVID-19  Result Value Ref Range   SARS Coronavirus 2 Ag Negative Negative  POCT Influenza A/B  Result Value Ref Range   Influenza A, POC Negative Negative   Influenza B, POC Negative Negative      Assessment & Plan:    Problem List Items Addressed This Visit   None Visit Diagnoses     Acute cough    -  Primary   Relevant Medications   levofloxacin (LEVAQUIN) 500 MG tablet   promethazine-dextromethorphan (PROMETHAZINE-DM) 6.25-15 MG/5ML syrup   albuterol (VENTOLIN  HFA) 108 (90 Base) MCG/ACT inhaler   Other Relevant Orders   POC COVID-19 (Completed)   POCT Influenza A/B (Completed)   DG Chest 2 View   Fever, unspecified fever cause       Relevant Medications   levofloxacin (LEVAQUIN) 500 MG tablet   Other Relevant Orders   DG Chest 2 View      Return if symptoms worsen or fail to improve.     Wilfred Lacy, NP

## 2022-06-07 NOTE — Patient Instructions (Addendum)
She choose to go to [facility]  Start oral abx Call office if no improvement in 7-10days.

## 2022-06-19 ENCOUNTER — Other Ambulatory Visit (HOSPITAL_COMMUNITY): Payer: Self-pay

## 2022-07-05 ENCOUNTER — Other Ambulatory Visit (HOSPITAL_COMMUNITY): Payer: Self-pay

## 2022-07-05 MED ORDER — PANTOPRAZOLE SODIUM 40 MG PO TBEC
40.0000 mg | DELAYED_RELEASE_TABLET | Freq: Every day | ORAL | 0 refills | Status: DC
  Filled 2022-07-05 (×2): qty 90, 90d supply, fill #0

## 2022-07-15 ENCOUNTER — Other Ambulatory Visit: Payer: Self-pay | Admitting: Nurse Practitioner

## 2022-07-15 DIAGNOSIS — I10 Essential (primary) hypertension: Secondary | ICD-10-CM

## 2022-07-16 ENCOUNTER — Other Ambulatory Visit (HOSPITAL_COMMUNITY): Payer: Self-pay

## 2022-07-16 ENCOUNTER — Other Ambulatory Visit: Payer: Self-pay

## 2022-07-16 MED ORDER — AMLODIPINE BESYLATE 5 MG PO TABS
5.0000 mg | ORAL_TABLET | Freq: Every evening | ORAL | 1 refills | Status: DC
  Filled 2022-07-16: qty 90, 90d supply, fill #0
  Filled 2022-10-16: qty 90, 90d supply, fill #1

## 2022-07-16 NOTE — Telephone Encounter (Signed)
Chart supports Rx Last OV: 06/2022 Next OV: 10/2022

## 2022-07-24 ENCOUNTER — Other Ambulatory Visit (HOSPITAL_COMMUNITY): Payer: Self-pay

## 2022-07-28 IMAGING — DX DG LUMBAR SPINE COMPLETE 4+V
5 series · 5 of 5 positions shown · non-contrast
Comparison: No recent prior.

CLINICAL DATA: Bilateral numbness and tingling of the toes. No
known injury.

EXAM:
LUMBAR SPINE - COMPLETE 4+ VIEW

[lumbar spine ap]
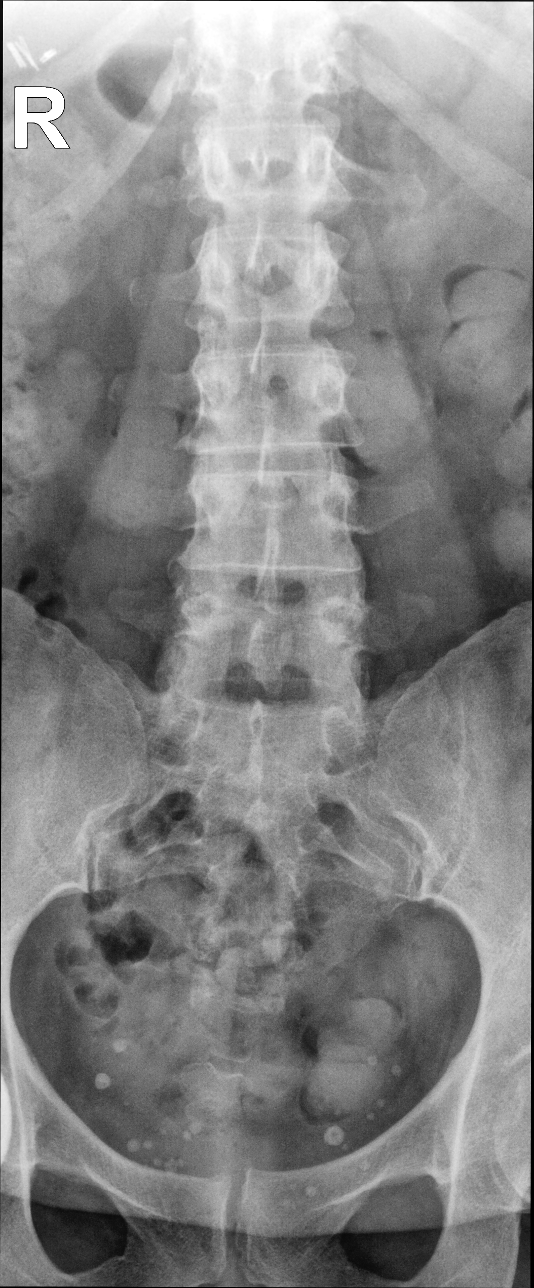

[lumbar spine lmo (1 of 2)]
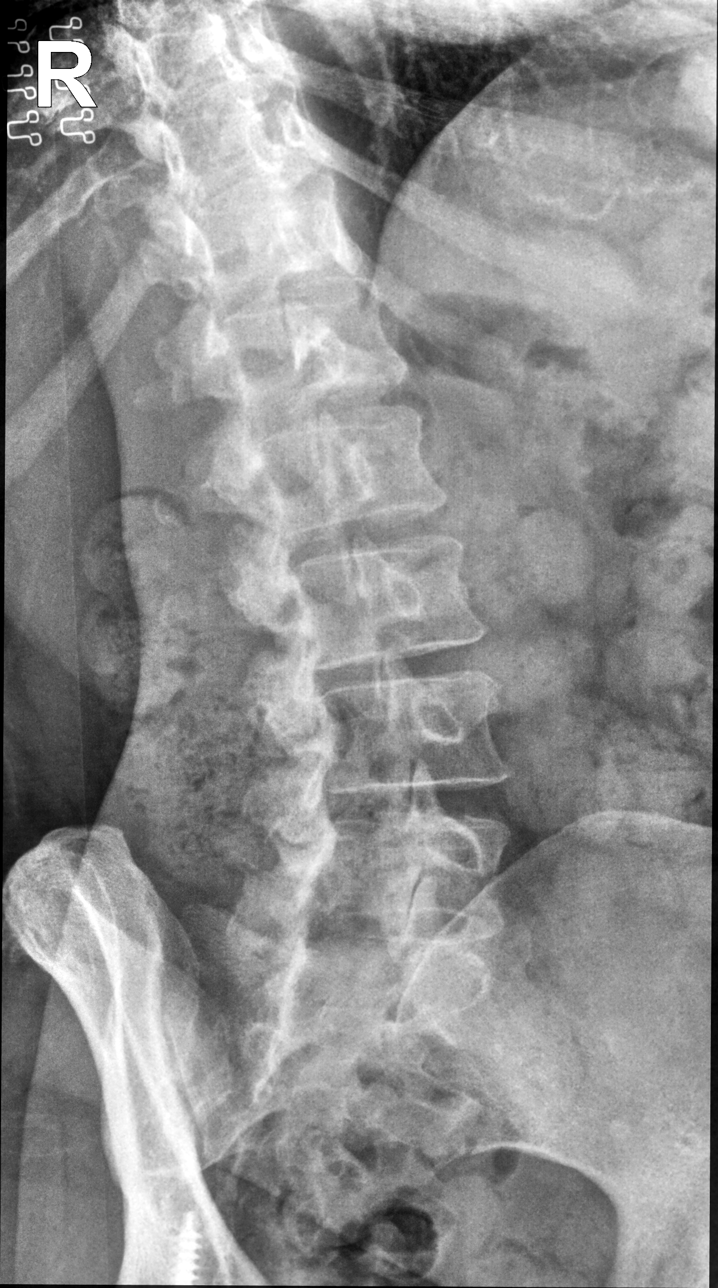

[lumbar spine lmo (2 of 2)]
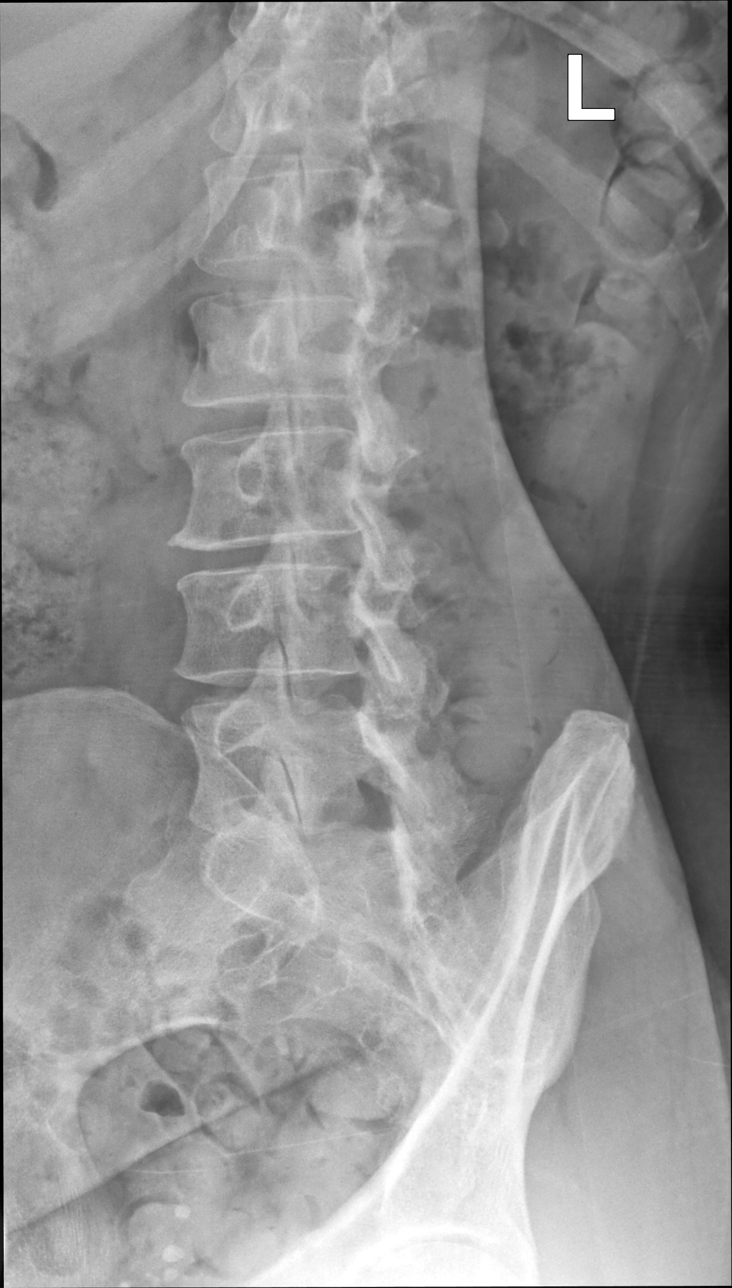

[lumbar spine lat (1 of 2)]
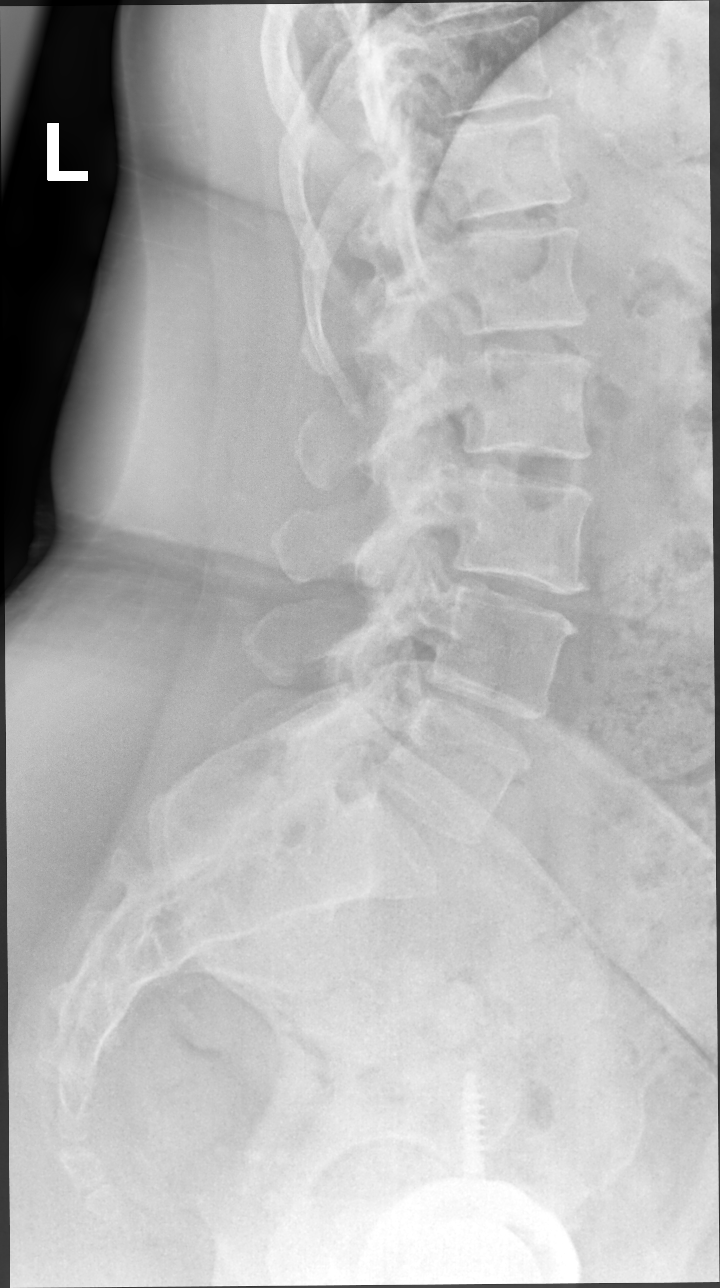

[lumbar spine lat (2 of 2)]
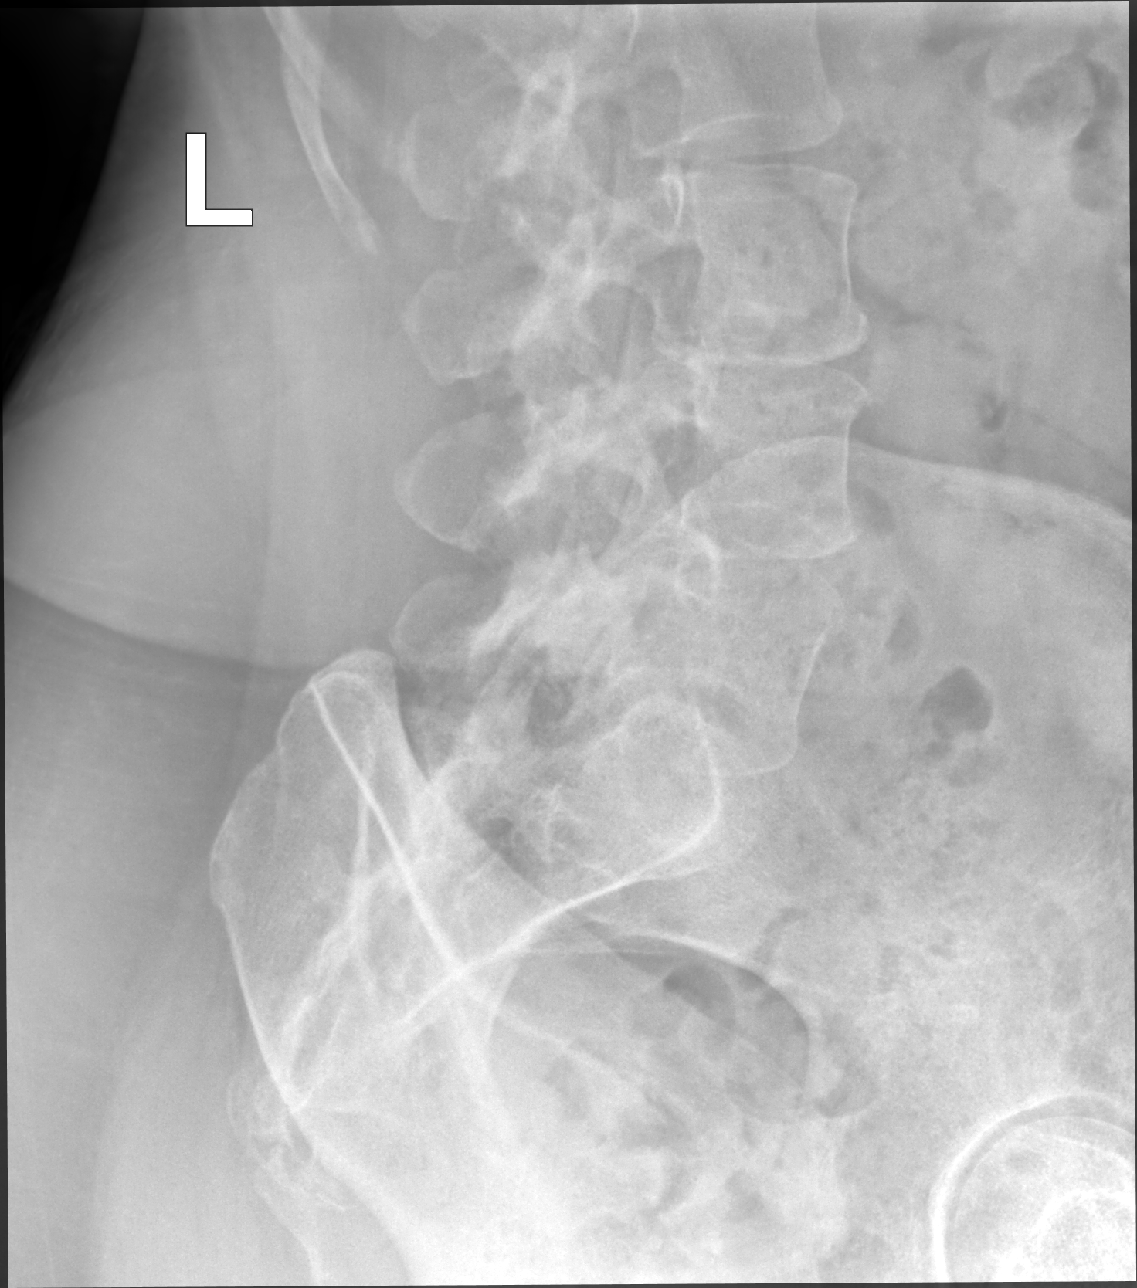

[5 of 5 positions shown; findings below may reference images not displayed]

FINDINGS: Diffuse mild multilevel disc degeneration and degenerative endplate
osteophyte formation. No acute bony abnormality. Normal bony
alignment. Prior right hip replacement. Pelvic calcifications
consistent phleboliths.
IMPRESSION: Diffuse mild multilevel degenerative change. No acute abnormality
identified.

## 2022-08-08 ENCOUNTER — Other Ambulatory Visit: Payer: Self-pay

## 2022-08-08 ENCOUNTER — Other Ambulatory Visit (HOSPITAL_COMMUNITY): Payer: Self-pay

## 2022-08-08 ENCOUNTER — Encounter: Payer: Self-pay | Admitting: Nurse Practitioner

## 2022-08-08 ENCOUNTER — Ambulatory Visit (INDEPENDENT_AMBULATORY_CARE_PROVIDER_SITE_OTHER): Payer: 59 | Admitting: Nurse Practitioner

## 2022-08-08 VITALS — BP 140/80 | HR 80 | Temp 98.0°F | Resp 16 | Ht 62.0 in | Wt 169.8 lb

## 2022-08-08 DIAGNOSIS — I10 Essential (primary) hypertension: Secondary | ICD-10-CM | POA: Diagnosis not present

## 2022-08-08 DIAGNOSIS — E1165 Type 2 diabetes mellitus with hyperglycemia: Secondary | ICD-10-CM

## 2022-08-08 DIAGNOSIS — M158 Other polyosteoarthritis: Secondary | ICD-10-CM

## 2022-08-08 DIAGNOSIS — M7501 Adhesive capsulitis of right shoulder: Secondary | ICD-10-CM

## 2022-08-08 DIAGNOSIS — E039 Hypothyroidism, unspecified: Secondary | ICD-10-CM | POA: Diagnosis not present

## 2022-08-08 DIAGNOSIS — E785 Hyperlipidemia, unspecified: Secondary | ICD-10-CM | POA: Diagnosis not present

## 2022-08-08 DIAGNOSIS — E1169 Type 2 diabetes mellitus with other specified complication: Secondary | ICD-10-CM

## 2022-08-08 DIAGNOSIS — M7502 Adhesive capsulitis of left shoulder: Secondary | ICD-10-CM

## 2022-08-08 LAB — TSH: TSH: 2.01 u[IU]/mL (ref 0.35–5.50)

## 2022-08-08 LAB — RENAL FUNCTION PANEL
Albumin: 4.4 g/dL (ref 3.5–5.2)
BUN: 16 mg/dL (ref 6–23)
CO2: 29 mEq/L (ref 19–32)
Calcium: 9.5 mg/dL (ref 8.4–10.5)
Chloride: 102 mEq/L (ref 96–112)
Creatinine, Ser: 0.69 mg/dL (ref 0.40–1.20)
GFR: 93.85 mL/min (ref >60.00)
Glucose, Bld: 78 mg/dL (ref 70–99)
Phosphorus: 3.5 mg/dL (ref 2.3–4.6)
Potassium: 4.1 mEq/L (ref 3.5–5.1)
Sodium: 140 mEq/L (ref 135–145)

## 2022-08-08 LAB — MICROALBUMIN / CREATININE URINE RATIO
Creatinine,U: 81.3 mg/dL
Microalb Creat Ratio: 1.9 mg/g (ref 0.0–30.0)
Microalb, Ur: 1.5 mg/dL (ref 0.0–1.9)

## 2022-08-08 LAB — LIPID PANEL
Cholesterol: 174 mg/dL (ref 0–200)
HDL: 71.1 mg/dL (ref >39.00)
LDL Cholesterol: 85 mg/dL (ref 0–99)
NonHDL: 102.54
Total CHOL/HDL Ratio: 2
Triglycerides: 86 mg/dL (ref 0.0–149.0)
VLDL: 17.2 mg/dL (ref 0.0–40.0)

## 2022-08-08 LAB — HEMOGLOBIN A1C: Hgb A1c MFr Bld: 5.6 % (ref 4.6–6.5)

## 2022-08-08 LAB — T4, FREE: Free T4: 2.02 ng/dL — ABNORMAL HIGH (ref 0.60–1.60)

## 2022-08-08 MED ORDER — MELOXICAM 15 MG PO TABS
15.0000 mg | ORAL_TABLET | Freq: Every day | ORAL | 0 refills | Status: DC
  Filled 2022-08-08: qty 30, 30d supply, fill #0

## 2022-08-08 MED ORDER — KETOROLAC TROMETHAMINE 30 MG/ML IJ SOLN
30.0000 mg | Freq: Once | INTRAMUSCULAR | Status: AC
  Administered 2022-08-08: 30 mg via INTRAMUSCULAR

## 2022-08-08 NOTE — Assessment & Plan Note (Addendum)
Bilateral shoulder pain with stiffness and limited ROM, worse with or without activity. Exacerbated with recent weight training exercises with 5lb dumb bells. She denies any previous injury or injection. No weakness, no paresthesia. No joint effusion or redness or fever. Hx of bilateral hip arthritis with right total hip arthroplasty. Hx of recurrent trigger thumb. Minimal relief with tylenol '650mg'$  and advil '200mg'$ . ESR, CK, ANA and CRP have been normal in past.  Administer toradol IM today Start mobic '15mg'$  daily with meals, can take with tylenol '650mg'$  every 8hrs Advised to avoid all OTC NSAIDs Check RH factor and anti-CCP today Entered referral to sports medicine

## 2022-08-08 NOTE — Assessment & Plan Note (Signed)
Repeat TSH and T4 °

## 2022-08-08 NOTE — Assessment & Plan Note (Signed)
Hx of white coat syndrome Home BP 120s-130s/70s. Reports compliance with amlodipine, coreg and use of CPAP BP Readings from Last 3 Encounters:  08/08/22 (!) 140/80  06/07/22 (!) 140/82  04/05/22 138/82    Advised to send BP reading via mychart every 2weeks Maintain med dose. F/up in 3-105month

## 2022-08-08 NOTE — Patient Instructions (Addendum)
Go to lab Start mobic '15mg'$  with supper Do not take any OTC NSAIDs while taking meloxicam Ok to use tylenol if needed. Send BP readings via mychart every 2weeks. You will be contacted to schedule appt with sports medicine. Cancel April appt

## 2022-08-08 NOTE — Progress Notes (Signed)
Established Patient Visit  Patient: Shelly Cohen   DOB: Mar 01, 1961   62 y.o. Female  MRN: [id number] Visit Date: 08/08/2022  Subjective:    Chief Complaint  Patient presents with   Office visit    Bilateral shoulder pain - The pain is worse at night - Pain 9/10  pain is sharp and constant    HPI DJD (degenerative joint disease) Bilateral shoulder pain with stiffness and limited ROM, worse with or without activity. Exacerbated with recent weight training exercises with 5lb dumb bells. She denies any previous injury or injection. No weakness, no paresthesia. No joint effusion or redness or fever. Hx of bilateral hip arthritis with right total hip arthroplasty. Hx of recurrent trigger thumb. Minimal relief with tylenol '650mg'$  and advil '200mg'$ . ESR, CK, ANA and CRP have been normal in past.  Administer toradol IM today Start mobic '15mg'$  daily with meals, can take with tylenol '650mg'$  every 8hrs Advised to avoid all OTC NSAIDs Check RH factor and anti-CCP today Entered referral to sports medicine  DM (diabetes mellitus) ([place])  Controlled with diet Repeat hgbA1c, Urine microalbumin and BMP  Hypothyroidism Repeat TSH and T4  HTN (hypertension), benign Hx of white coat syndrome Home BP 120s-130s/70s. Reports compliance with amlodipine, coreg and use of CPAP BP Readings from Last 3 Encounters:  08/08/22 (!) 140/80  06/07/22 (!) 140/82  04/05/22 138/82    Advised to send BP reading via mychart every 2weeks Maintain med dose. F/up in 3-71month   Reviewed medical, surgical, and social history today  Medications: Outpatient Medications Prior to Visit  Medication Sig   albuterol (VENTOLIN HFA) 108 (90 Base) MCG/ACT inhaler Inhale 1-2 puffs into the lungs every 6 (six) hours as needed for wheezing or shortness of breath.   amLODipine (NORVASC) 5 MG tablet Take 1 tablet (5 mg total) by mouth every evening.   BIOTIN PO Take 1 tablet by mouth daily.    CALCIUM-VITAMIN D PO Take 1 tablet by mouth daily.   carvedilol (COREG) 3.125 MG tablet Take 1 tablet by mouth 2 times daily.   docusate sodium (COLACE) 100 MG capsule Take 100 mg by mouth every other day.   fluticasone (FLONASE) 50 MCG/ACT nasal spray Place 1 spray into both nostrils daily as needed for allergies or rhinitis.   gabapentin (NEURONTIN) 100 MG capsule Take 1 capsule (100 mg total) by mouth 3 (three) times daily.   KRILL OIL PO Take 1 capsule by mouth daily.   latanoprost (XALATAN) 0.005 % ophthalmic solution Place 1 drop into both eyes every evening   levofloxacin (LEVAQUIN) 500 MG tablet Take 1 tablet (500 mg total) by mouth daily.   levothyroxine (SYNTHROID) 50 MCG tablet TAKE 1 TABLET BY MOUTH DAILY BEFORE BREAKFAST   MELATONIN PO Take 1 tablet by mouth at bedtime.   pantoprazole (PROTONIX) 40 MG tablet Take 1 tablet (40 mg total) by mouth daily.   pantoprazole (PROTONIX) 40 MG tablet Take 1 tablet (40 mg total) by mouth daily.   Semaglutide-Weight Management (WEGOVY) 2.4 MG/0.75ML SOAJ Inject 2.4 mg into the skin once a week.   [DISCONTINUED] promethazine-dextromethorphan (PROMETHAZINE-DM) 6.25-15 MG/5ML syrup Take 5 mLs by mouth 4 (four) times daily as needed for cough. (Patient not taking: Reported on 08/08/2022)   No facility-administered medications prior to visit.   Reviewed past medical and social history.   ROS per HPI above      Objective:  BP (Marland Kitchen  140/80   Pulse 80   Temp 98 F (36.7 C) (Temporal)   Resp 16   Ht '5\' 2"'$  (1.575 m)   Wt 169 lb 12.8 oz (77 kg)   SpO2 98%   BMI 31.06 kg/m      Physical Exam Vitals reviewed.  Constitutional:      General: She is not in acute distress. Cardiovascular:     Rate and Rhythm: Normal rate and regular rhythm.     Pulses: Normal pulses.     Heart sounds: Normal heart sounds.  Pulmonary:     Effort: Pulmonary effort is normal.     Breath sounds: Normal breath sounds.  Abdominal:     General: Bowel sounds are  normal.     Palpations: Abdomen is soft.  Musculoskeletal:     Right shoulder: Tenderness and crepitus present. No swelling, deformity, effusion or laceration. Decreased range of motion. Normal strength. Normal pulse.     Left shoulder: Tenderness and crepitus present. No swelling, deformity, effusion or laceration. Decreased range of motion. Normal strength. Normal pulse.     Right upper arm: Normal.     Left upper arm: Normal.     Right elbow: Normal.     Left elbow: Normal.     Cervical back: Normal.  Neurological:     Mental Status: She is alert.     No results found for any visits on 08/08/22.    Assessment & Plan:    Problem List Items Addressed This Visit       Cardiovascular and Mediastinum   HTN (hypertension), benign    Hx of white coat syndrome Home BP 120s-130s/70s. Reports compliance with amlodipine, coreg and use of CPAP BP Readings from Last 3 Encounters:  08/08/22 (!) 140/80  06/07/22 (!) 140/82  04/05/22 138/82    Advised to send BP reading via mychart every 2weeks Maintain med dose. F/up in 3-7month      Relevant Orders   Renal Function Panel     Endocrine   DM (diabetes mellitus) ([place]     Controlled with diet Repeat hgbA1c, Urine microalbumin and BMP      Relevant Orders   Hemoglobin A1c   Renal Function Panel   Microalbumin / creatinine urine ratio   Hypothyroidism    Repeat TSH and T4      Relevant Orders   T4, free   TSH     Musculoskeletal and Integument   DJD (degenerative joint disease)    Bilateral shoulder pain with stiffness and limited ROM, worse with or without activity. Exacerbated with recent weight training exercises with 5lb dumb bells. She denies any previous injury or injection. No weakness, no paresthesia. No joint effusion or redness or fever. Hx of bilateral hip arthritis with right total hip arthroplasty. Hx of recurrent trigger thumb. Minimal relief with tylenol '650mg'$  and advil '200mg'$ . ESR, CK, ANA and CRP have  been normal in past.  Administer toradol IM today Start mobic '15mg'$  daily with meals, can take with tylenol '650mg'$  every 8hrs Advised to avoid all OTC NSAIDs Check RH factor and anti-CCP today Entered referral to sports medicine      Relevant Medications   meloxicam (MOBIC) 15 MG tablet   Other Relevant Orders   Rheumatoid Factor   CYCLIC CITRUL PEPTIDE ANTIBODY, IGG/IGA   Other Visit Diagnoses     Adhesive capsulitis of both shoulders    -  Primary   Relevant Medications   meloxicam (MOBIC) 15 MG tablet  ketorolac (TORADOL) 30 MG/ML injection 30 mg (Completed)   Other Relevant Orders   Ambulatory referral to Sports Medicine   Rheumatoid Factor   Hyperlipidemia associated with type 2 diabetes mellitus ([place])       Relevant Orders   Lipid panel      Return in about 6 months (around 02/06/2023) for DM, HTN, hyperlipidemia (fasting), Hypothyroidism.     Wilfred Lacy, NP

## 2022-08-08 NOTE — Assessment & Plan Note (Signed)
Controlled with diet Repeat hgbA1c, Urine microalbumin and BMP

## 2022-08-09 LAB — RHEUMATOID FACTOR: Rheumatoid fact SerPl-aCnc: <14 IU/mL (ref <14)

## 2022-08-10 ENCOUNTER — Other Ambulatory Visit: Payer: Self-pay | Admitting: Nurse Practitioner

## 2022-08-10 DIAGNOSIS — E039 Hypothyroidism, unspecified: Secondary | ICD-10-CM

## 2022-08-10 LAB — CYCLIC CITRUL PEPTIDE ANTIBODY, IGG/IGA: Cyclic Citrullin Peptide Ab: 7 units (ref 0–19)

## 2022-08-11 ENCOUNTER — Other Ambulatory Visit (HOSPITAL_COMMUNITY): Payer: Self-pay

## 2022-08-11 MED ORDER — LEVOTHYROXINE SODIUM 50 MCG PO TABS
50.0000 ug | ORAL_TABLET | Freq: Every day | ORAL | 1 refills | Status: DC
  Filled 2022-08-11: qty 90, 90d supply, fill #0
  Filled 2022-11-06: qty 90, 90d supply, fill #1

## 2022-08-13 ENCOUNTER — Other Ambulatory Visit (HOSPITAL_COMMUNITY): Payer: Self-pay

## 2022-08-13 ENCOUNTER — Other Ambulatory Visit: Payer: Self-pay

## 2022-08-16 ENCOUNTER — Ambulatory Visit
Admission: RE | Admit: 2022-08-16 | Discharge: 2022-08-16 | Disposition: A | Discharge status: Home / Self Care | Payer: 59 | Source: Ambulatory Visit | Attending: Sports Medicine | Admitting: Sports Medicine

## 2022-08-16 ENCOUNTER — Other Ambulatory Visit (HOSPITAL_COMMUNITY): Payer: Self-pay

## 2022-08-16 ENCOUNTER — Ambulatory Visit (INDEPENDENT_AMBULATORY_CARE_PROVIDER_SITE_OTHER): Payer: 59 | Admitting: Sports Medicine

## 2022-08-16 VITALS — BP 128/88 | Ht 62.0 in | Wt 166.0 lb

## 2022-08-16 DIAGNOSIS — M25511 Pain in right shoulder: Secondary | ICD-10-CM | POA: Diagnosis not present

## 2022-08-16 DIAGNOSIS — M25512 Pain in left shoulder: Secondary | ICD-10-CM

## 2022-08-16 DIAGNOSIS — Z96612 Presence of left artificial shoulder joint: Secondary | ICD-10-CM | POA: Diagnosis not present

## 2022-08-16 DIAGNOSIS — G8929 Other chronic pain: Secondary | ICD-10-CM

## 2022-08-16 DIAGNOSIS — Z471 Aftercare following joint replacement surgery: Secondary | ICD-10-CM | POA: Diagnosis not present

## 2022-08-16 DIAGNOSIS — Z96611 Presence of right artificial shoulder joint: Secondary | ICD-10-CM | POA: Diagnosis not present

## 2022-08-16 MED ORDER — DICLOFENAC SODIUM 75 MG PO TBEC
DELAYED_RELEASE_TABLET | ORAL | 0 refills | Status: DC
  Filled 2022-08-16: qty 60, 30d supply, fill #0

## 2022-08-16 NOTE — Progress Notes (Signed)
PCP: Flossie Buffy, NP  Subjective:   HPI: Patient is a 62 y.o. female referred here by PCP for presumably bilateral adhesive capsulitis.  Pt complains bilateral shoulder pain for past few months. During the day the pain is achy, worse when lifting heavier objects such as a gallon of milk or when trying to reach above her shoulders. Also feels the pain in her lower posterior neck. She has sharp pain in her shoulders at night and feels like they are "locking in place", they feel tight, she is frequently rolling from side to side at night. She denies any numbness or tingling down arms or in fingers.  She has tried volatren gel which helps for a couple hours. Tried tylenol but it didn't help so she stopped. She was prescribed Mobic by PCP which she has been taking daily since 08/08/2021 but it has not helped.    PMHx significant for DJD: hip OA w/ R hip replacement    BP 128/88   Ht 5' 2"$  (1.575 m)   Wt 166 lb (75.3 kg)   BMI 30.36 kg/m       Objective:  Physical Exam:  Gen: awake, alert, NAD, comfortable in exam room Pulm: breathing unlabored Shoulders: Inspection reveals no obvious deformity, atrophy, or asymmetry. No swelling.  Diffuse pain with palpation of shoulders and posterior cervical neck musculature bilaterally. Exam limited due to pain.  Limited active ROM in all planes bilaterally due to pain.  Limited passive external rotation bilaterally.  NV intact distally    Assessment & Plan:   Chronic bilateral shoulder pain Could be due to adhesive capsulitis vs glenohumeral OA as patient is limited with external rotation bilaterally. -Bilateral shoulder x-rays to look for evidence of OA -Patient advised to stop Mobic -Start oral diclofenac twice daily   Precious Gilding, DO Family medicine resident, PGY-2  Patient seen and evaluated with the resident.  I agree with the above plan of care.  Will order x-rays to evaluate for possible glenohumeral osteoarthritis.  I will  follow-up with the patient with those results in a few days.  We will change her Mobic to diclofenac and we will see if she receives a favorable response with this.  We will delineate further treatment based on her x-ray findings.  This note was dictated using Dragon naturally speaking software and may contain errors in syntax, spelling, or content which have not been identified prior to signing this note.

## 2022-08-18 ENCOUNTER — Other Ambulatory Visit: Payer: Self-pay | Admitting: Nurse Practitioner

## 2022-08-18 DIAGNOSIS — I10 Essential (primary) hypertension: Secondary | ICD-10-CM

## 2022-08-19 ENCOUNTER — Other Ambulatory Visit (HOSPITAL_COMMUNITY): Payer: Self-pay

## 2022-08-20 ENCOUNTER — Encounter: Payer: Self-pay | Admitting: Sports Medicine

## 2022-08-20 ENCOUNTER — Other Ambulatory Visit (HOSPITAL_COMMUNITY): Payer: Self-pay

## 2022-08-20 ENCOUNTER — Other Ambulatory Visit: Payer: Self-pay

## 2022-08-20 MED ORDER — CARVEDILOL 3.125 MG PO TABS
3.1250 mg | ORAL_TABLET | Freq: Two times a day (BID) | ORAL | 3 refills | Status: DC
  Filled 2022-08-20: qty 180, 90d supply, fill #0
  Filled 2022-11-18: qty 180, 90d supply, fill #1
  Filled 2023-02-14: qty 180, 90d supply, fill #2
  Filled 2023-05-13: qty 180, 90d supply, fill #3

## 2022-08-22 ENCOUNTER — Other Ambulatory Visit: Payer: Self-pay

## 2022-08-22 DIAGNOSIS — G8929 Other chronic pain: Secondary | ICD-10-CM

## 2022-08-28 ENCOUNTER — Encounter: Payer: Self-pay | Admitting: Nurse Practitioner

## 2022-08-30 ENCOUNTER — Other Ambulatory Visit (HOSPITAL_COMMUNITY): Payer: Self-pay

## 2022-08-30 MED ORDER — WEGOVY 2.4 MG/0.75ML ~~LOC~~ SOAJ
2.4000 mg | SUBCUTANEOUS | 2 refills | Status: DC
  Filled 2022-08-30: qty 3, 28d supply, fill #0
  Filled 2022-09-19 – 2022-09-22 (×2): qty 3, 28d supply, fill #1
  Filled 2022-10-21: qty 3, 28d supply, fill #2

## 2022-09-02 ENCOUNTER — Other Ambulatory Visit (HOSPITAL_COMMUNITY): Payer: Self-pay

## 2022-09-05 ENCOUNTER — Encounter: Payer: Self-pay | Admitting: Physical Therapy

## 2022-09-05 ENCOUNTER — Ambulatory Visit: Payer: 59 | Attending: Sports Medicine | Admitting: Physical Therapy

## 2022-09-05 DIAGNOSIS — G8929 Other chronic pain: Secondary | ICD-10-CM | POA: Insufficient documentation

## 2022-09-05 DIAGNOSIS — R252 Cramp and spasm: Secondary | ICD-10-CM | POA: Diagnosis not present

## 2022-09-05 DIAGNOSIS — M25512 Pain in left shoulder: Secondary | ICD-10-CM | POA: Diagnosis not present

## 2022-09-05 DIAGNOSIS — M25511 Pain in right shoulder: Secondary | ICD-10-CM | POA: Diagnosis not present

## 2022-09-05 DIAGNOSIS — M6281 Muscle weakness (generalized): Secondary | ICD-10-CM | POA: Diagnosis not present

## 2022-09-05 NOTE — Therapy (Signed)
OUTPATIENT PHYSICAL THERAPY SHOULDER EVALUATION   Patient Name: Forest Otey MRN: [id number] DOB:10/29/1960, 62 y.o., female Today's Date: 09/05/2022  END OF SESSION:   Past Medical History:  Diagnosis Date   Alpha thalassemia trait    Arthritis    hips   Fibroadenoma    Glaucoma    monitored every 22month, right eye worse than left    History of positive PPD, treatment status unknown    Hypertension    OSA (obstructive sleep apnea)    mild with AHI 8/hr   Pre-diabetes    Thyroid disease    Past Surgical History:  Procedure Laterality Date   ABDOMINAL HYSTERECTOMY  2013   BREAST BIOPSY  2013   BUNIONECTOMY  10/2014   CHOLECYSTECTOMY  2015   COLONOSCOPY     hallux limi Right 2019   great  toe revision   TOTAL HIP ARTHROPLASTY Right 09/12/2018   Procedure: RIGHT TOTAL HIP ARTHROPLASTY ANTERIOR APPROACH;  Surgeon: BMcarthur Rossetti MD;  Location: [facility];  Service: Orthopedics;  Laterality: Right;   TUBAL LIGATION  1990   UPPER GI ENDOSCOPY N/A 02/20/2021   Procedure: UPPER GI ENDOSCOPY;  Surgeon: WGreer Pickerel MD;  Location: [facility];  Service: General;  Laterality: N/A;   Patient Active Problem List   Diagnosis Date Noted   Myalgia 04/05/2022   White coat syndrome with diagnosis of hypertension 03/22/2022   Chronic hip pain after total replacement of right hip joint 08/14/2021   Numbness and tingling of foot 04/04/2021   S/P laparoscopic sleeve gastrectomy 02/20/2021   OSA (obstructive sleep apnea) 02/09/2020   DJD (degenerative joint disease) 02/09/2020   DM (diabetes mellitus) ([place] 08/13/2019   Primary insomnia 08/13/2019   Status post total replacement of right hip 09/12/2018   Obesity (BMI 30-39.9) 08/22/2018   Trigger thumb, left thumb 05/07/2018   OA (osteoarthritis) of hip 04/22/2018   Open angle primary glaucoma 01/08/2018   Thalassemia trait, alpha 06/29/2016   Iron deficiency anemia 06/29/2016   S/P bunionectomy 06/29/2016   HTN  (hypertension), benign 05/18/2016   Hypothyroidism 05/18/2016    PCP: NFlossie Buffy NP  REFERRING PROVIDER: DThurman Coyer DO  REFERRING DIAG:  Diagnosis  M25.511,G89.29,M25.512 (ICD-10-CM) - Chronic pain of both shoulders    THERAPY DIAG:  No diagnosis found.  Rationale for Evaluation and Treatment: Rehabilitation  ONSET DATE: 08/17/22  SUBJECTIVE:                                                                                                                                                                                      SUBJECTIVE STATEMENT: Patient reports H/O LBP after hip surgery for  R THR in 2019. The back pain resolved, but she still has R hip pain and has difficulty sleeping. She now reports B shoulder starting about 4 months. Mostly in the shoulder itself. R shoulder is stiffening up and has become very painful at night. She work from home and has a standing desk. She also has set up her work station to provide adequate support, but she mostly stands. She was lifting light weights and trying to do planks, which seemed to aggravate the pain. She also has a H/O working at [employer], lifting heavy boxes.  PERTINENT HISTORY: R THR 2019  PAIN:  Are you having pain? Yes: NPRS scale: 9/10 Pain location: B shoulders, R > L Pain description: sharp, wakes her up, pops during the day, sometimes hurts. Aggravating factors: Lying down Relieving factors: Lying on her back with shoulders and arms supported.  PRECAUTIONS: None  WEIGHT BEARING RESTRICTIONS: No  FALLS:  Has patient fallen in last 6 months? No  LIVING ENVIRONMENT: Lives with: lives with their family Lives in: House/apartment Stairs: No issues Has following equipment at home: None  OCCUPATION: Works from home on computer, has standing desk  PLOF: Independent  PATIENT GOALS:Be able to return to normal daily activities without shoulder pain. Sleep without shoulder pain.  NEXT MD VISIT:    OBJECTIVE:   DIAGNOSTIC FINDINGS:  X rays 08/16/22 1. No acute osseous injury of bilateral shoulders.   PATIENT SURVEYS:  FOTO 56.6  COGNITION: Overall cognitive status: Within functional limits for tasks assessed     SENSATION: WFL  POSTURE: B shoulders hiked.  UPPER EXTREMITY ROM:   Active ROM Right eval Left eval  Shoulder flexion 52 75  Shoulder extension    Shoulder abduction 54 48  Shoulder adduction    Shoulder internal rotation    Shoulder external rotation 12 8  Elbow flexion    Elbow extension    Wrist flexion    Wrist extension    Wrist ulnar deviation    Wrist radial deviation    Wrist pronation    Wrist supination    (Blank rows = not tested)  UPPER EXTREMITY MMT: Shoulders deferred as patient could not tolerate due to pain, the rest of UE 4/5.   SHOULDER SPECIAL TESTS: Impingement tests: Hawkins/Kennedy impingement test: positive  and Painful arc test: positive  Rotator cuff assessment: Drop arm test: positive  and Empty can test: positive  Biceps assessment: Speed's test: positive   JOINT MOBILITY TESTING:  B shoulders with severely limited mobility in all planes, fearful and guarding. B scapular mobility limited due to muscle guarding and pain.  PALPATION:  Very tight and TTP B cervical paraspinals, ant neck, up traps, LS, medial scapular muscles. Supraspinatus tendon and biceps tendon TTP, tight pects   TODAY'S TREATMENT:  DATE:  09/05/22 Education Pedulum exercises and deep breathing to relax arm in pendulum position.   PATIENT EDUCATION: Education details: POC, Person educated: Patient Education method: Customer service manager Education comprehension: verbalized understanding and returned demonstration  HOME EXERCISE PROGRAM: Pendulum exercises for B shoulders, deep breathing to relax  shoulder and facilitate some natural traction.  ASSESSMENT:  CLINICAL IMPRESSION: Patient is a 62 y.o. who was seen today for physical therapy evaluation and treatment for B shoulder pain. She is severely guarded, with muscle spasm and tightness throughout B upper trunk and neck.She reports great difficulty sleeping due to pain waking her up, and modification of self care activities due to inability to lift her arms. Limited evaluation due to severity of muscle spasm, guarding and pain. Initiated some education for gentle scapular active mobilization and relaxation/deep breathing with pendulum to introduce some movement into the area. We need to decrease the muscle tension and guarding in order to allow her to perform additional stretching and strengthening activities to recover her painfree ROM and function of her shoulders.  OBJECTIVE IMPAIRMENTS: decreased activity tolerance, decreased endurance, decreased mobility, decreased ROM, decreased strength, hypomobility, increased fascial restrictions, increased muscle spasms, impaired flexibility, impaired UE functional use, improper body mechanics, postural dysfunction, and pain.   ACTIVITY LIMITATIONS: carrying, lifting, bending, squatting, sleeping, bed mobility, dressing, reach over head, and hygiene/grooming  PARTICIPATION LIMITATIONS: cleaning, laundry, shopping, and community activity  PERSONAL FACTORS: Past/current experiences are also affecting patient's functional outcome.   REHAB POTENTIAL: Good  CLINICAL DECISION MAKING: Evolving/moderate complexity  EVALUATION COMPLEXITY: High   GOALS: Goals reviewed with patient? Yes  SHORT TERM GOALS: Target date: 09/20/22  I with initial HEP Baseline: Goal status: INITIAL  LONG TERM GOALS: Target date: 11/28/22  I with final HEP Baseline:  Goal status: INITIAL  2.  Increase FOTO to at least 67 Baseline: 56.6 Goal status: INITIAL  3.  Patient will achieve full AROM in B shoulders  with pain < 3/10 Baseline: Max elevation of 75 degrees with severe pain. Goal status: INITIAL  4.  Patient will demosntrate B shoulder strength of at least 4/5, with pain < 3/10 Baseline: Unable to tolerate any resistance or move against gravity due to pain. Goal status: INITIAL  5.  Patient will be able to sleep x at least 6 hours without waking due to pain in shoulders Baseline: 1-2 hours Goal status: INITIAL  6.  Patient will be able to perform all self care and daily activities with B shoulder < 3/10, no compensations needed. Baseline:  Goal status: INITIAL  PLAN:  PT FREQUENCY: 1-2x/week  PT DURATION: 12 weeks  PLANNED INTERVENTIONS: Therapeutic exercises, Therapeutic activity, Neuromuscular re-education, Balance training, Gait training, Patient/Family education, Self Care, Joint mobilization, Dry Needling, Electrical stimulation, Spinal mobilization, Cryotherapy, Moist heat, Taping, Vasopneumatic device, Ultrasound, Ionotophoresis '4mg'$ /ml Dexamethasone, and Manual therapy  PLAN FOR NEXT SESSION: Gentle STM and stretch, acute pain relief, relaxation to decrease guarding, HEP   Marcelina Morel, DPT 09/05/2022, 2:57 PM

## 2022-09-13 ENCOUNTER — Ambulatory Visit: Payer: 59

## 2022-09-13 DIAGNOSIS — G8929 Other chronic pain: Secondary | ICD-10-CM

## 2022-09-13 DIAGNOSIS — R252 Cramp and spasm: Secondary | ICD-10-CM | POA: Diagnosis not present

## 2022-09-13 DIAGNOSIS — M6281 Muscle weakness (generalized): Secondary | ICD-10-CM | POA: Diagnosis not present

## 2022-09-13 DIAGNOSIS — M25511 Pain in right shoulder: Secondary | ICD-10-CM | POA: Diagnosis not present

## 2022-09-13 DIAGNOSIS — M25512 Pain in left shoulder: Secondary | ICD-10-CM | POA: Diagnosis not present

## 2022-09-13 NOTE — Therapy (Signed)
OUTPATIENT PHYSICAL THERAPY SHOULDER TREATMENT   Patient Name: Shelly Cohen MRN: [id number] DOB:11-16-1960, 62 y.o., female Today's Date: 09/13/2022  END OF SESSION:   Past Medical History:  Diagnosis Date   Alpha thalassemia trait    Arthritis    hips   Fibroadenoma    Glaucoma    monitored every 55month, right eye worse than left    History of positive PPD, treatment status unknown    Hypertension    OSA (obstructive sleep apnea)    mild with AHI 8/hr   Pre-diabetes    Thyroid disease    Past Surgical History:  Procedure Laterality Date   ABDOMINAL HYSTERECTOMY  2013   BREAST BIOPSY  2013   BUNIONECTOMY  10/2014   CHOLECYSTECTOMY  2015   COLONOSCOPY     hallux limi Right 2019   great  toe revision   TOTAL HIP ARTHROPLASTY Right 09/12/2018   Procedure: RIGHT TOTAL HIP ARTHROPLASTY ANTERIOR APPROACH;  Surgeon: BMcarthur Rossetti MD;  Location: [facility];  Service: Orthopedics;  Laterality: Right;   TUBAL LIGATION  1990   UPPER GI ENDOSCOPY N/A 02/20/2021   Procedure: UPPER GI ENDOSCOPY;  Surgeon: WGreer Pickerel MD;  Location: [facility];  Service: General;  Laterality: N/A;   Patient Active Problem List   Diagnosis Date Noted   Myalgia 04/05/2022   White coat syndrome with diagnosis of hypertension 03/22/2022   Chronic hip pain after total replacement of right hip joint 08/14/2021   Numbness and tingling of foot 04/04/2021   S/P laparoscopic sleeve gastrectomy 02/20/2021   OSA (obstructive sleep apnea) 02/09/2020   DJD (degenerative joint disease) 02/09/2020   [place] 08/13[place]   Primary insomnia 08/13/2019   Status post total replacement of right hip 09/12/2018   Obesity (BMI 30-39.9) 08/22/2018   Trigger thumb, left thumb 05/07/2018   OA (osteoarthritis) of hip 04/22/2018   Open angle primary glaucoma 01/08/2018   Thalassemia trait, alpha 06/29/2016   Iron deficiency anemia 06/29/2016   S/P bunionectomy 06/29/2016   HTN  (hypertension), benign 05/18/2016   Hypothyroidism 05/18/2016    PCP: NFlossie Buffy NP  REFERRING PROVIDER: DThurman Coyer DO  REFERRING DIAG:  Diagnosis  M25.511,G89.29,M25.512 (ICD-10-CM) - Chronic pain of both shoulders    THERAPY DIAG:  No diagnosis found.  Rationale for Evaluation and Treatment: Rehabilitation  ONSET DATE: 08/17/22  SUBJECTIVE:                                                                                                                                                                                      SUBJECTIVE STATEMENT: Its doing alright, I have been doing the  exercises.   PERTINENT HISTORY: R THR 2019  PAIN:  Are you having pain? Yes: NPRS scale: 7/10 Pain location: B shoulders, R > L Pain description: sharp, wakes her up, pops during the day, sometimes hurts. Aggravating factors: Lying down Relieving factors: Lying on her back with shoulders and arms supported.  PRECAUTIONS: None  WEIGHT BEARING RESTRICTIONS: No  FALLS:  Has patient fallen in last 6 months? No  LIVING ENVIRONMENT: Lives with: lives with their family Lives in: House/apartment Stairs: No issues Has following equipment at home: None  OCCUPATION: Works from home on computer, has standing desk  PLOF: Independent  PATIENT GOALS:Be able to return to normal daily activities without shoulder pain. Sleep without shoulder pain.  NEXT MD VISIT:   OBJECTIVE:   DIAGNOSTIC FINDINGS:  X rays 08/16/22 1. No acute osseous injury of bilateral shoulders.   PATIENT SURVEYS:  FOTO 56.6  COGNITION: Overall cognitive status: Within functional limits for tasks assessed     SENSATION: WFL  POSTURE: B shoulders hiked.  UPPER EXTREMITY ROM:   Active ROM Right eval Left eval  Shoulder flexion 52 75  Shoulder extension    Shoulder abduction 54 48  Shoulder adduction    Shoulder internal rotation    Shoulder external rotation 12 8  Elbow flexion    Elbow  extension    Wrist flexion    Wrist extension    Wrist ulnar deviation    Wrist radial deviation    Wrist pronation    Wrist supination    (Blank rows = not tested)  UPPER EXTREMITY MMT: Shoulders deferred as patient could not tolerate due to pain, the rest of UE 4/5.   SHOULDER SPECIAL TESTS: Impingement tests: Hawkins/Kennedy impingement test: positive  and Painful arc test: positive  Rotator cuff assessment: Drop arm test: positive  and Empty can test: positive  Biceps assessment: Speed's test: positive   JOINT MOBILITY TESTING:  B shoulders with severely limited mobility in all planes, fearful and guarding. B scapular mobility limited due to muscle guarding and pain.  PALPATION:  Very tight and TTP B cervical paraspinals, ant neck, up traps, LS, medial scapular muscles. Supraspinatus tendon and biceps tendon TTP, tight pects   TODAY'S TREATMENT:                                                                                                                                         DATE:  09/13/22 UBE 1 min forwards and backwards- c/o of sharp pain in R shoulder Wall slides x10 STM to neck and UT Shoulder flexion forward rolling blue pball x10  PROM to both shoulders AAROM supine flexion with dowel x10  Standing abd with dowel x10  Standing IR with dowel x10 Standing shoulder flexion with dowel x10  Standing extension with dowel x10   09/05/22 Education Pedulum exercises and deep breathing to relax arm in pendulum position.  PATIENT EDUCATION: Education details: POC, Person educated: Patient Education method: Customer service manager Education comprehension: verbalized understanding and returned demonstration  HOME EXERCISE PROGRAM: Pendulum exercises for B shoulders, deep breathing to relax shoulder and facilitate some natural traction. AAROM with dowel flexion, IR, abd, extension  ASSESSMENT:  CLINICAL IMPRESSION: Patient is very painful with all  movements. Sore and TTP bilateral upper traps and has increased trigger points. She has jump signs with just light touch. Tried to do some light exercises to work on ROM but she is very guarded and fearful with movements. With PROM she has increased muscle guarding and catching sensations especially on the R side. She has a hard time relaxing to get into further ranges, but the L side has more mobility. She did better towards end of visit with AAROM with dowel. Added these to her HEP  OBJECTIVE IMPAIRMENTS: decreased activity tolerance, decreased endurance, decreased mobility, decreased ROM, decreased strength, hypomobility, increased fascial restrictions, increased muscle spasms, impaired flexibility, impaired UE functional use, improper body mechanics, postural dysfunction, and pain.   ACTIVITY LIMITATIONS: carrying, lifting, bending, squatting, sleeping, bed mobility, dressing, reach over head, and hygiene/grooming  PARTICIPATION LIMITATIONS: cleaning, laundry, shopping, and community activity  PERSONAL FACTORS: Past/current experiences are also affecting patient's functional outcome.   REHAB POTENTIAL: Good  CLINICAL DECISION MAKING: Evolving/moderate complexity  EVALUATION COMPLEXITY: High   GOALS: Goals reviewed with patient? Yes  SHORT TERM GOALS: Target date: 09/20/22  I with initial HEP Baseline: Goal status: INITIAL  LONG TERM GOALS: Target date: 11/28/22  I with final HEP Baseline:  Goal status: INITIAL  2.  Increase FOTO to at least 67 Baseline: 56.6 Goal status: INITIAL  3.  Patient will achieve full AROM in B shoulders with pain < 3/10 Baseline: Max elevation of 75 degrees with severe pain. Goal status: INITIAL  4.  Patient will demosntrate B shoulder strength of at least 4/5, with pain < 3/10 Baseline: Unable to tolerate any resistance or move against gravity due to pain. Goal status: INITIAL  5.  Patient will be able to sleep x at least 6 hours without waking  due to pain in shoulders Baseline: 1-2 hours Goal status: INITIAL  6.  Patient will be able to perform all self care and daily activities with B shoulder < 3/10, no compensations needed. Baseline:  Goal status: INITIAL  PLAN:  PT FREQUENCY: 1-2x/week  PT DURATION: 12 weeks  PLANNED INTERVENTIONS: Therapeutic exercises, Therapeutic activity, Neuromuscular re-education, Balance training, Gait training, Patient/Family education, Self Care, Joint mobilization, Dry Needling, Electrical stimulation, Spinal mobilization, Cryotherapy, Moist heat, Taping, Vasopneumatic device, Ultrasound, Ionotophoresis '4mg'$ /ml Dexamethasone, and Manual therapy  PLAN FOR NEXT SESSION: Gentle STM and stretch, acute pain relief, relaxation to decrease guarding, maybe try DN  Andris Baumann, DPT 09/13/2022, 3:09 PM

## 2022-09-17 ENCOUNTER — Encounter: Payer: Self-pay | Admitting: Physical Therapy

## 2022-09-17 ENCOUNTER — Ambulatory Visit: Payer: 59 | Admitting: Physical Therapy

## 2022-09-17 DIAGNOSIS — M25512 Pain in left shoulder: Secondary | ICD-10-CM | POA: Diagnosis not present

## 2022-09-17 DIAGNOSIS — M6281 Muscle weakness (generalized): Secondary | ICD-10-CM

## 2022-09-17 DIAGNOSIS — G8929 Other chronic pain: Secondary | ICD-10-CM | POA: Diagnosis not present

## 2022-09-17 DIAGNOSIS — R252 Cramp and spasm: Secondary | ICD-10-CM | POA: Diagnosis not present

## 2022-09-17 DIAGNOSIS — M25511 Pain in right shoulder: Secondary | ICD-10-CM | POA: Diagnosis not present

## 2022-09-17 NOTE — Therapy (Signed)
OUTPATIENT PHYSICAL THERAPY SHOULDER TREATMENT   Patient Name: Shelly Cohen MRN: [id number] DOB:30-Mar-1961, 62 y.o., female Today's Date: 09/17/2022  END OF SESSION:  PT End of Session - 09/17/22 1754     Visit Number 3    Date for PT Re-Evaluation 11/28/22    PT Start Time 1710    PT Stop Time [id number]    PT Time Calculation (min) 45 min    Activity Tolerance Patient limited by pain    Behavior During Therapy [facility] for tasks assessed/performed             Past Medical History:  Diagnosis Date   Alpha thalassemia trait    Arthritis    hips   Fibroadenoma    Glaucoma    monitored every 76months, right eye worse than left    History of positive PPD, treatment status unknown    Hypertension    OSA (obstructive sleep apnea)    mild with AHI 8/hr   Pre-diabetes    Thyroid disease    Past Surgical History:  Procedure Laterality Date   ABDOMINAL HYSTERECTOMY  2013   BREAST BIOPSY  2013   BUNIONECTOMY  10/2014   CHOLECYSTECTOMY  2015   COLONOSCOPY     hallux limi Right 2019   great  toe revision   TOTAL HIP ARTHROPLASTY Right 09/12/2018   Procedure: RIGHT TOTAL HIP ARTHROPLASTY ANTERIOR APPROACH;  Surgeon: Mcarthur Rossetti, MD;  Location: [facility];  Service: Orthopedics;  Laterality: Right;   TUBAL LIGATION  1990   UPPER GI ENDOSCOPY N/A 02/20/2021   Procedure: UPPER GI ENDOSCOPY;  Surgeon: Greer Pickerel, MD;  Location: [facility];  Service: General;  Laterality: N/A;   Patient Active Problem List   Diagnosis Date Noted   Myalgia 04/05/2022   White coat syndrome with diagnosis of hypertension 03/22/2022   Chronic hip pain after total replacement of right hip joint 08/14/2021   Numbness and tingling of foot 04/04/2021   S/P laparoscopic sleeve gastrectomy 02/20/2021   OSA (obstructive sleep apnea) 02/09/2020   DJD (degenerative joint disease) 02/09/2020   DM (diabetes mellitus) ([place]) 08/13/2019   Primary insomnia 08/13/2019   Status post total replacement of  right hip 09/12/2018   Obesity (BMI 30-39.9) 08/22/2018   Trigger thumb, left thumb 05/07/2018   OA (osteoarthritis) of hip 04/22/2018   Open angle primary glaucoma 01/08/2018   Thalassemia trait, alpha 06/29/2016   Iron deficiency anemia 06/29/2016   S/P bunionectomy 06/29/2016   HTN (hypertension), benign 05/18/2016   Hypothyroidism 05/18/2016    PCP: Flossie Buffy, NP  REFERRING PROVIDER: Thurman Coyer, DO  REFERRING DIAG:  Diagnosis  M25.511,G89.29,M25.512 (ICD-10-CM) - Chronic pain of both shoulders    THERAPY DIAG:  Chronic pain of both shoulders  Cramp and spasm  Muscle weakness (generalized)  Rationale for Evaluation and Treatment: Rehabilitation  ONSET DATE: 08/17/22  SUBJECTIVE:  SUBJECTIVE STATEMENT: Patient reports that her pain was much worse after her last visit. She still feels pain now.    PERTINENT HISTORY: R THR 2019  PAIN:  Are you having pain? Yes: NPRS scale: 7/10 Pain location: B shoulders, R > L Pain description: sharp, wakes her up, pops during the day, sometimes hurts. Aggravating factors: Lying down Relieving factors: Lying on her back with shoulders and arms supported.  PRECAUTIONS: None  WEIGHT BEARING RESTRICTIONS: No  FALLS:  Has patient fallen in last 6 months? No  LIVING ENVIRONMENT: Lives with: lives with their family Lives in: House/apartment Stairs: No issues Has following equipment at home: None  OCCUPATION: Works from home on computer, has standing desk  PLOF: Independent  PATIENT GOALS:Be able to return to normal daily activities without shoulder pain. Sleep without shoulder pain.  NEXT MD VISIT:   OBJECTIVE:   DIAGNOSTIC FINDINGS:  X rays 08/16/22 1. No acute osseous injury of bilateral shoulders.   PATIENT SURVEYS:   FOTO 56.6  COGNITION: Overall cognitive status: Within functional limits for tasks assessed     SENSATION: WFL  POSTURE: B shoulders hiked.  UPPER EXTREMITY ROM:   Active ROM Right eval Left eval  Shoulder flexion 52 75  Shoulder extension    Shoulder abduction 54 48  Shoulder adduction    Shoulder internal rotation    Shoulder external rotation 12 8  Elbow flexion    Elbow extension    Wrist flexion    Wrist extension    Wrist ulnar deviation    Wrist radial deviation    Wrist pronation    Wrist supination    (Blank rows = not tested)  UPPER EXTREMITY MMT: Shoulders deferred as patient could not tolerate due to pain, the rest of UE 4/5.   SHOULDER SPECIAL TESTS: Impingement tests: Hawkins/Kennedy impingement test: positive  and Painful arc test: positive  Rotator cuff assessment: Drop arm test: positive  and Empty can test: positive  Biceps assessment: Speed's test: positive   JOINT MOBILITY TESTING:  B shoulders with severely limited mobility in all planes, fearful and guarding. B scapular mobility limited due to muscle guarding and pain.  PALPATION:  Very tight and TTP B cervical paraspinals, ant neck, up traps, LS, medial scapular muscles. Supraspinatus tendon and biceps tendon TTP, tight pects   TODAY'S TREATMENT:                                                                                                                                         DATE:  09/17/22 MH to neck and upper shoulders, 5 minutes Seated lower trunk ant/post weight shifts Seated scapular retraction with shoulders depressed. Seated reach with rotation behind trunk, sliding hand on mat. Seated reach with rotation across her body with shoulder depression Standing shoulder flex, pushing towel across mat and back x 10 reps Seated chin tucks against ball on wall. Standing  B shoulder ext with dowel x 10 reps Standing upper traps stretch, holding wrist behind trunk and gently pulling to  R, with R lateral neck flexion. Supine MH and e-stim to neck and upper shoulders, IFC, x 12 minutes to prevent rebound pain after activity.  09/13/22 UBE 1 min forwards and backwards- c/o of sharp pain in R shoulder Wall slides x10 STM to neck and UT Shoulder flexion forward rolling blue pball x10  PROM to both shoulders AAROM supine flexion with dowel x10  Standing abd with dowel x10  Standing IR with dowel x10 Standing shoulder flexion with dowel x10  Standing extension with dowel x10  09/05/22 Education Pedulum exercises and deep breathing to relax arm in pendulum position.   PATIENT EDUCATION: Education details: POC, Person educated: Patient Education method: Customer service manager Education comprehension: verbalized understanding and returned demonstration  HOME EXERCISE PROGRAM: Pendulum exercises for B shoulders, deep breathing to relax shoulder and facilitate some natural traction. AAROM with dowel flexion, IR, abd, extension  ASSESSMENT:  CLINICAL IMPRESSION: Patient reports severe pain after last treatment, which lasted > 2 days. She remains very tense and spasmed in neck and upper shoulders. Today's treatment attempted to mobilize her muscles herself, starting lower and with B scapular movement, progressing to gentle stretch to upper traps and neck. Finished with MH and e-stim to try to avoid rebound pain and spasm.  OBJECTIVE IMPAIRMENTS: decreased activity tolerance, decreased endurance, decreased mobility, decreased ROM, decreased strength, hypomobility, increased fascial restrictions, increased muscle spasms, impaired flexibility, impaired UE functional use, improper body mechanics, postural dysfunction, and pain.   ACTIVITY LIMITATIONS: carrying, lifting, bending, squatting, sleeping, bed mobility, dressing, reach over head, and hygiene/grooming  PARTICIPATION LIMITATIONS: cleaning, laundry, shopping, and community activity  PERSONAL FACTORS: Past/current  experiences are also affecting patient's functional outcome.   REHAB POTENTIAL: Good  CLINICAL DECISION MAKING: Evolving/moderate complexity  EVALUATION COMPLEXITY: High   GOALS: Goals reviewed with patient? Yes  SHORT TERM GOALS: Target date: 09/20/22  I with initial HEP Baseline: Goal status: INITIAL  LONG TERM GOALS: Target date: 11/28/22  I with final HEP Baseline:  Goal status: INITIAL  2.  Increase FOTO to at least 67 Baseline: 56.6 Goal status: INITIAL  3.  Patient will achieve full AROM in B shoulders with pain < 3/10 Baseline: Max elevation of 75 degrees with severe pain. Goal status: INITIAL  4.  Patient will demosntrate B shoulder strength of at least 4/5, with pain < 3/10 Baseline: Unable to tolerate any resistance or move against gravity due to pain. Goal status: INITIAL  5.  Patient will be able to sleep x at least 6 hours without waking due to pain in shoulders Baseline: 1-2 hours Goal status: INITIAL  6.  Patient will be able to perform all self care and daily activities with B shoulder < 3/10, no compensations needed. Baseline:  Goal status: INITIAL  PLAN:  PT FREQUENCY: 1-2x/week  PT DURATION: 12 weeks  PLANNED INTERVENTIONS: Therapeutic exercises, Therapeutic activity, Neuromuscular re-education, Balance training, Gait training, Patient/Family education, Self Care, Joint mobilization, Dry Needling, Electrical stimulation, Spinal mobilization, Cryotherapy, Moist heat, Taping, Vasopneumatic device, Ultrasound, Ionotophoresis 4mg /ml Dexamethasone, and Manual therapy  PLAN FOR NEXT SESSION: Gentle STM and stretch, acute pain relief, relaxation to decrease guarding, maybe try DN  Ethel Rana DPT 09/17/22 6:04 PM  09/17/2022, 6:04 PM

## 2022-09-19 ENCOUNTER — Other Ambulatory Visit (HOSPITAL_COMMUNITY): Payer: Self-pay

## 2022-09-19 ENCOUNTER — Ambulatory Visit: Payer: 59 | Admitting: Physical Therapy

## 2022-09-19 ENCOUNTER — Encounter: Payer: Self-pay | Admitting: Physical Therapy

## 2022-09-19 DIAGNOSIS — M6281 Muscle weakness (generalized): Secondary | ICD-10-CM | POA: Diagnosis not present

## 2022-09-19 DIAGNOSIS — M25512 Pain in left shoulder: Secondary | ICD-10-CM | POA: Diagnosis not present

## 2022-09-19 DIAGNOSIS — R252 Cramp and spasm: Secondary | ICD-10-CM | POA: Diagnosis not present

## 2022-09-19 DIAGNOSIS — M25511 Pain in right shoulder: Secondary | ICD-10-CM | POA: Diagnosis not present

## 2022-09-19 DIAGNOSIS — G8929 Other chronic pain: Secondary | ICD-10-CM

## 2022-09-19 NOTE — Therapy (Signed)
OUTPATIENT PHYSICAL THERAPY SHOULDER TREATMENT   Patient Name: Shelly Cohen MRN: [id number] DOB:May 30, 1961, 62 y.o., female Today's Date: 09/19/2022  END OF SESSION:  PT End of Session - 09/19/22 1718     Visit Number 4    Date for PT Re-Evaluation 11/28/22    PT Start Time 1714    PT Stop Time 1755    PT Time Calculation (min) 41 min    Activity Tolerance Patient limited by pain    Behavior During Therapy [facility] for tasks assessed/performed              Past Medical History:  Diagnosis Date   Alpha thalassemia trait    Arthritis    hips   Fibroadenoma    Glaucoma    monitored every 27months, right eye worse than left    History of positive PPD, treatment status unknown    Hypertension    OSA (obstructive sleep apnea)    mild with AHI 8/hr   Pre-diabetes    Thyroid disease    Past Surgical History:  Procedure Laterality Date   ABDOMINAL HYSTERECTOMY  2013   BREAST BIOPSY  2013   BUNIONECTOMY  10/2014   CHOLECYSTECTOMY  2015   COLONOSCOPY     hallux limi Right 2019   great  toe revision   TOTAL HIP ARTHROPLASTY Right 09/12/2018   Procedure: RIGHT TOTAL HIP ARTHROPLASTY ANTERIOR APPROACH;  Surgeon: Mcarthur Rossetti, MD;  Location: [facility];  Service: Orthopedics;  Laterality: Right;   TUBAL LIGATION  1990   UPPER GI ENDOSCOPY N/A 02/20/2021   Procedure: UPPER GI ENDOSCOPY;  Surgeon: Greer Pickerel, MD;  Location: [facility];  Service: General;  Laterality: N/A;   Patient Active Problem List   Diagnosis Date Noted   Myalgia 04/05/2022   White coat syndrome with diagnosis of hypertension 03/22/2022   Chronic hip pain after total replacement of right hip joint 08/14/2021   Numbness and tingling of foot 04/04/2021   S/P laparoscopic sleeve gastrectomy 02/20/2021   OSA (obstructive sleep apnea) 02/09/2020   DJD (degenerative joint disease) 02/09/2020   DM (diabetes mellitus) ([place]) 08/13/2019   Primary insomnia 08/13/2019   Status post total replacement of  right hip 09/12/2018   Obesity (BMI 30-39.9) 08/22/2018   Trigger thumb, left thumb 05/07/2018   OA (osteoarthritis) of hip 04/22/2018   Open angle primary glaucoma 01/08/2018   Thalassemia trait, alpha 06/29/2016   Iron deficiency anemia 06/29/2016   S/P bunionectomy 06/29/2016   HTN (hypertension), benign 05/18/2016   Hypothyroidism 05/18/2016    PCP: Flossie Buffy, NP  REFERRING PROVIDER: Thurman Coyer, DO  REFERRING DIAG:  Diagnosis  M25.511,G89.29,M25.512 (ICD-10-CM) - Chronic pain of both shoulders    THERAPY DIAG:  Chronic pain of both shoulders  Cramp and spasm  Muscle weakness (generalized)  Rationale for Evaluation and Treatment: Rehabilitation  ONSET DATE: 08/17/22  SUBJECTIVE:  SUBJECTIVE STATEMENT: Patient reports that her pain was much worse after her last visit. She still feels pain now.    PERTINENT HISTORY: R THR 2019  PAIN:  Are you having pain? Yes: NPRS scale: 7/10 Pain location: B shoulders, R > L Pain description: sharp, wakes her up, pops during the day, sometimes hurts. Aggravating factors: Lying down Relieving factors: Lying on her back with shoulders and arms supported.  PRECAUTIONS: None  WEIGHT BEARING RESTRICTIONS: No  FALLS:  Has patient fallen in last 6 months? No  LIVING ENVIRONMENT: Lives with: lives with their family Lives in: House/apartment Stairs: No issues Has following equipment at home: None  OCCUPATION: Works from home on computer, has standing desk  PLOF: Independent  PATIENT GOALS:Be able to return to normal daily activities without shoulder pain. Sleep without shoulder pain.  NEXT MD VISIT:   OBJECTIVE:   DIAGNOSTIC FINDINGS:  X rays 08/16/22 1. No acute osseous injury of bilateral shoulders.   PATIENT SURVEYS:   FOTO 56.6  COGNITION: Overall cognitive status: Within functional limits for tasks assessed     SENSATION: WFL  POSTURE: B shoulders hiked.  UPPER EXTREMITY ROM:   Active ROM Right eval Left eval  Shoulder flexion 52 75  Shoulder extension    Shoulder abduction 54 48  Shoulder adduction    Shoulder internal rotation    Shoulder external rotation 12 8  Elbow flexion    Elbow extension    Wrist flexion    Wrist extension    Wrist ulnar deviation    Wrist radial deviation    Wrist pronation    Wrist supination    (Blank rows = not tested)  UPPER EXTREMITY MMT: Shoulders deferred as patient could not tolerate due to pain, the rest of UE 4/5.   SHOULDER SPECIAL TESTS: Impingement tests: Hawkins/Kennedy impingement test: positive  and Painful arc test: positive  Rotator cuff assessment: Drop arm test: positive  and Empty can test: positive  Biceps assessment: Speed's test: positive   JOINT MOBILITY TESTING:  B shoulders with severely limited mobility in all planes, fearful and guarding. B scapular mobility limited due to muscle guarding and pain.  PALPATION:  Very tight and TTP B cervical paraspinals, ant neck, up traps, LS, medial scapular muscles. Supraspinatus tendon and biceps tendon TTP, tight pects   TODAY'S TREATMENT:                                                                                                                                         DATE:  09/19/22 UBE in stand, L1 2 min forward and back Standing B shoulder ext with dowel x 10 reps Standing with back to wall, ball behind head to promote chin tuck, shoulder flex with dowel, emphasizing raising distally and keeping shoulders down. Repeat with abd x 5 each direction Seated scapular retraction Attempted baby eagle stretch, but too painful Upper traps  stretch MH and IFC estim to neck and shoulders for pain relief.  09/17/22 MH to neck and upper shoulders, 5 minutes Seated lower trunk ant/post  weight shifts Seated scapular retraction with shoulders depressed. Seated reach with rotation behind trunk, sliding hand on mat. Seated reach with rotation across her body with shoulder depression Standing shoulder flex, pushing towel across mat and back x 10 reps Seated chin tucks against ball on wall. Standing B shoulder ext with dowel x 10 reps Standing upper traps stretch, holding wrist behind trunk and gently pulling to R, with R lateral neck flexion. Supine MH and e-stim to neck and upper shoulders, IFC, x 12 minutes to prevent rebound pain after activity.  09/13/22 UBE 1 min forwards and backwards- c/o of sharp pain in R shoulder Wall slides x10 STM to neck and UT Shoulder flexion forward rolling blue pball x10  PROM to both shoulders AAROM supine flexion with dowel x10  Standing abd with dowel x10  Standing IR with dowel x10 Standing shoulder flexion with dowel x10  Standing extension with dowel x10  09/05/22 Education Pedulum exercises and deep breathing to relax arm in pendulum position.   PATIENT EDUCATION: Education details: POC, Person educated: Patient Education method: Customer service manager Education comprehension: verbalized understanding and returned demonstration  HOME EXERCISE PROGRAM: Pendulum exercises for B shoulders, deep breathing to relax shoulder and facilitate some natural traction. AAROM with dowel flexion, IR, abd, extension  ASSESSMENT:  CLINICAL IMPRESSION: Patient reports severe pain after last treatment, which lasted > 2 days. She remains very tense and spasmed in neck and upper shoulders. Today's treatment attempted to mobilize her muscles herself, starting lower and with B scapular movement, progressing to gentle stretch to upper traps and neck. Finished with MH and e-stim to try to avoid rebound pain and spasm.  OBJECTIVE IMPAIRMENTS: decreased activity tolerance, decreased endurance, decreased mobility, decreased ROM, decreased  strength, hypomobility, increased fascial restrictions, increased muscle spasms, impaired flexibility, impaired UE functional use, improper body mechanics, postural dysfunction, and pain.   ACTIVITY LIMITATIONS: carrying, lifting, bending, squatting, sleeping, bed mobility, dressing, reach over head, and hygiene/grooming  PARTICIPATION LIMITATIONS: cleaning, laundry, shopping, and community activity  PERSONAL FACTORS: Past/current experiences are also affecting patient's functional outcome.   REHAB POTENTIAL: Good  CLINICAL DECISION MAKING: Evolving/moderate complexity  EVALUATION COMPLEXITY: High   GOALS: Goals reviewed with patient? Yes  SHORT TERM GOALS: Target date: 09/20/22  I with initial HEP Baseline: Goal status: 09/19/22-Iniitated  LONG TERM GOALS: Target date: 11/28/22  I with final HEP Baseline:  Goal status: INITIAL  2.  Increase FOTO to at least 67 Baseline: 56.6 Goal status: INITIAL  3.  Patient will achieve full AROM in B shoulders with pain < 3/10 Baseline: Max elevation of 75 degrees with severe pain. Goal status: 09/19/22-ongoing  4.  Patient will demosntrate B shoulder strength of at least 4/5, with pain < 3/10 Baseline: Unable to tolerate any resistance or move against gravity due to pain. Goal status: INITIAL  5.  Patient will be able to sleep x at least 6 hours without waking due to pain in shoulders Baseline: 1-2 hours Goal status: INITIAL  6.  Patient will be able to perform all self care and daily activities with B shoulder < 3/10, no compensations needed. Baseline:  Goal status: INITIAL  PLAN:  PT FREQUENCY: 1-2x/week  PT DURATION: 12 weeks  PLANNED INTERVENTIONS: Therapeutic exercises, Therapeutic activity, Neuromuscular re-education, Balance training, Gait training, Patient/Family education, Self Care, Joint mobilization, Dry  Needling, Electrical stimulation, Spinal mobilization, Cryotherapy, Moist heat, Taping, Vasopneumatic device,  Ultrasound, Ionotophoresis 4mg /ml Dexamethasone, and Manual therapy  PLAN FOR NEXT SESSION: Gentle STM and stretch, acute pain relief, relaxation to decrease guarding, progress stretching and strengthening as tolerated.  Ethel Rana DPT 09/19/22 5:53 PM

## 2022-09-25 ENCOUNTER — Ambulatory Visit: Payer: 59 | Admitting: Physical Therapy

## 2022-09-26 ENCOUNTER — Ambulatory Visit: Payer: 59 | Admitting: Physical Therapy

## 2022-09-26 ENCOUNTER — Other Ambulatory Visit (HOSPITAL_BASED_OUTPATIENT_CLINIC_OR_DEPARTMENT_OTHER): Payer: Self-pay

## 2022-09-26 ENCOUNTER — Emergency Department (HOSPITAL_BASED_OUTPATIENT_CLINIC_OR_DEPARTMENT_OTHER)
Admission: EM | Admit: 2022-09-26 | Discharge: 2022-09-26 | Disposition: A | Discharge status: Home / Self Care | Payer: 59 | Attending: Emergency Medicine | Admitting: Emergency Medicine

## 2022-09-26 ENCOUNTER — Other Ambulatory Visit: Payer: Self-pay

## 2022-09-26 ENCOUNTER — Encounter (HOSPITAL_BASED_OUTPATIENT_CLINIC_OR_DEPARTMENT_OTHER): Payer: Self-pay | Admitting: Emergency Medicine

## 2022-09-26 DIAGNOSIS — Z79899 Other long term (current) drug therapy: Secondary | ICD-10-CM | POA: Diagnosis not present

## 2022-09-26 DIAGNOSIS — M62830 Muscle spasm of back: Secondary | ICD-10-CM | POA: Insufficient documentation

## 2022-09-26 DIAGNOSIS — M545 Low back pain, unspecified: Secondary | ICD-10-CM | POA: Diagnosis present

## 2022-09-26 MED ORDER — KETOROLAC TROMETHAMINE 30 MG/ML IJ SOLN
30.0000 mg | Freq: Once | INTRAMUSCULAR | Status: AC
  Administered 2022-09-26: 30 mg via INTRAMUSCULAR
  Filled 2022-09-26: qty 1

## 2022-09-26 MED ORDER — CYCLOBENZAPRINE HCL 10 MG PO TABS
10.0000 mg | ORAL_TABLET | Freq: Once | ORAL | Status: AC
  Administered 2022-09-26: 10 mg via ORAL
  Filled 2022-09-26: qty 1

## 2022-09-26 MED ORDER — CYCLOBENZAPRINE HCL 10 MG PO TABS
10.0000 mg | ORAL_TABLET | Freq: Two times a day (BID) | ORAL | 0 refills | Status: AC | PRN
  Filled 2022-09-26: qty 28, 14d supply, fill #0

## 2022-09-26 NOTE — ED Notes (Signed)
Patient has a urine specimen in the lab

## 2022-09-26 NOTE — ED Triage Notes (Addendum)
Pt c/o lower back pain and spasms since yesterday; sts she was getting a filling and in the same position for a long time; ambulatory and sts pain is worse when sitting

## 2022-09-26 NOTE — Discharge Instructions (Signed)
You are seen in the emergency department for low back pain.  Given that you have been having this pain for an extended period of time and appears to have become aggravated and flared up with your dental visit that he had yesterday, I believe that this is likely a simple back spasm that is flared up.  You are given a dose of Toradol and Flexeril here in the emergency department which appears to have improved your symptoms.  I sent a prescription for this Flexeril medication to your pharmacy which you can start taking later today since he received 1 dose here.  You may take this medication twice a day but please be advised that it can cause fatigue and sedation so I would advise against taking this medication if you plan to operate a vehicle.  If your symptoms worsen you begin to experience any significant lower leg numbness or weakness, numbness in your groin or pelvis, loss of bowel or bladder control please return to the emergency department for further evaluation.

## 2022-09-26 NOTE — ED Provider Notes (Signed)
[facility] Provider Note   CSN: [id number] Arrival date & time: 09/26/22  1109     History Chief Complaint  Patient presents with   Back Pain    Shelly Cohen is a 62 y.o. female.  Patient presents emergency department complaints of back pain.  She has a previous history of low back pain with sciatic radiation into bilateral legs.  She has been taking over-the-counter anti-inflammatory medications and also attending physical therapy but reports she is not having significant improvement in her symptoms.  She reports that she was recently at the dentist yesterday having a filling performed and she feels that the position that she was laying in for extended period of time is what aggravated her back pain at this time.  She denies any lower leg weakness or numbness.  Denies any saddle paresthesia, loss of bowel or bladder control.  Reports that she has previously taken Flexeril for muscle spasm in her lower back which has managed her symptoms well.   Back Pain      Home Medications Prior to Admission medications   Medication Sig Start Date End Date Taking? Authorizing Provider  cyclobenzaprine (FLEXERIL) 10 MG tablet Take 1 tablet (10 mg total) by mouth 2 (two) times daily as needed for up to 14 days for muscle spasms. 09/26/22 10/10/22 Yes Luvenia Heller, PA-C  albuterol (VENTOLIN HFA) 108 (90 Base) MCG/ACT inhaler Inhale 1-2 puffs into the lungs every 6 (six) hours as needed for wheezing or shortness of breath. 06/07/22   Nche, Charlene Brooke, NP  amLODipine (NORVASC) 5 MG tablet Take 1 tablet (5 mg total) by mouth every evening. 07/16/22   Nche, Charlene Brooke, NP  BIOTIN PO Take 1 tablet by mouth daily.    [provider]  CALCIUM-VITAMIN D PO Take 1 tablet by mouth daily.    [provider]  carvedilol (COREG) 3.125 MG tablet Take 1 tablet (3.125 mg total) by mouth 2 (two) times daily. 08/20/22   Nche, Charlene Brooke, NP   diclofenac (VOLTAREN) 75 MG EC tablet Take 1 tablet twice daily with food for 5 days. Then take as needed. 08/16/22   Thurman Coyer, DO  docusate sodium (COLACE) 100 MG capsule Take 100 mg by mouth every other day.    [provider]  fluticasone (FLONASE) 50 MCG/ACT nasal spray Place 1 spray into both nostrils daily as needed for allergies or rhinitis.    [provider]  gabapentin (NEURONTIN) 100 MG capsule Take 1 capsule (100 mg total) by mouth 3 (three) times daily. 04/26/22   Nche, Charlene Brooke, NP  KRILL OIL PO Take 1 capsule by mouth daily.    [provider]  latanoprost (XALATAN) 0.005 % ophthalmic solution Place 1 drop into both eyes every evening 06/21/21     levofloxacin (LEVAQUIN) 500 MG tablet Take 1 tablet (500 mg total) by mouth daily. 06/07/22   Nche, Charlene Brooke, NP  levothyroxine (SYNTHROID) 50 MCG tablet Take 1 tablet (50 mcg total) by mouth daily before breakfast. 08/11/22   Nche, Charlene Brooke, NP  MELATONIN PO Take 1 tablet by mouth at bedtime.    [provider]  pantoprazole (PROTONIX) 40 MG tablet Take 1 tablet (40 mg total) by mouth daily. 06/01/21     pantoprazole (PROTONIX) 40 MG tablet Take 1 tablet (40 mg total) by mouth daily. 07/04/22     Semaglutide-Weight Management (WEGOVY) 2.4 MG/0.75ML SOAJ Inject 2.4 mg into the skin once  a week. 05/02/22     Semaglutide-Weight Management (WEGOVY) 2.4 MG/0.75ML SOAJ Inject 2.4 mg into the skin every 7 (seven) days. 08/30/22         Allergies    Dilaudid [hydromorphone hcl], Diphenhydramine hcl, Hydrocodone, Oxycodone, Azithromycin, Cephalexin, Penicillin g, and Zolpidem    Review of Systems   Review of Systems  Musculoskeletal:  Positive for back pain.  All other systems reviewed and are negative.   Physical Exam Updated Vital Signs BP (!) 203/85 (BP Location: Left Arm)   Pulse 100   Temp 98 F (36.7 C) (Oral)   Resp 17   Ht 5\' 2"  (1.575 m)   Wt 74.8 kg   SpO2 100%   BMI  30.18 kg/m  Physical Exam Vitals and nursing note reviewed.  Constitutional:      General: She is not in acute distress.    Appearance: She is well-developed.  HENT:     Head: Normocephalic and atraumatic.  Eyes:     Conjunctiva/sclera: Conjunctivae normal.  Cardiovascular:     Rate and Rhythm: Normal rate and regular rhythm.     Heart sounds: No murmur heard. Pulmonary:     Effort: Pulmonary effort is normal. No respiratory distress.     Breath sounds: Normal breath sounds.  Abdominal:     Palpations: Abdomen is soft.     Tenderness: There is no abdominal tenderness.  Musculoskeletal:        General: Tenderness present. No swelling.       Arms:     Cervical back: Neck supple.  Skin:    General: Skin is warm and dry.     Capillary Refill: Capillary refill takes less than 2 seconds.  Neurological:     Mental Status: She is alert.  Psychiatric:        Mood and Affect: Mood normal.     ED Results / Procedures / Treatments   Labs (all labs ordered are listed, but only abnormal results are displayed) Labs Reviewed - No data to display  EKG None  Radiology No results found.  Procedures Procedures   Medications Ordered in ED Medications  ketorolac (TORADOL) 30 MG/ML injection 30 mg (30 mg Intramuscular Given 09/26/22 1148)  cyclobenzaprine (FLEXERIL) tablet 10 mg (10 mg Oral Given 09/26/22 1148)    ED Course/ Medical Decision Making/ A&P                           Medical Decision Making Risk Prescription drug management.   This patient presents to the ED for concern of back pain. Differential diagnosis includes sciatica, muscle spasms, UTI, nephrolithiasis   Medicines ordered and prescription drug management:  I ordered medication including Toradol, Flexeril for back spasms Reevaluation of the patient after these medicines showed that the patient improved I have reviewed the patients home medicines and have made adjustments as needed   Problem List / ED  Course:  Patient presented emergency department with complaints of low back pain.  She is also reporting associated back spasms that began yesterday.  She reports that she is in an awkward seating position at the dentist having a filling and this triggered her back pain.  She reports she has previously had success with Flexeril for back spasms but has not had any available at home to try.  Tried taking over-the-counter medications for the pain but has not had significant success in controlling the pain.  Dose of Toradol and Flexeril given  here in the emergency department with notable symptomatic improvement prior to patient discharge.  Prescription sent to patient's pharmacy for Flexeril so she may take that over the next several days.  Advised patient return back to the emergency department she begins to experience any significant lower extremity weakness or numbness, saddle paresthesia, loss of bowel or bladder control.  Patient was agreeable treatment plan verbalized understanding all return precautions. All questions answered prior to patient discharge.  Final Clinical Impression(s) / ED Diagnoses Final diagnoses:  Muscle spasm of back    Rx / DC Orders ED Discharge Orders          Ordered    cyclobenzaprine (FLEXERIL) 10 MG tablet  2 times daily PRN        09/26/22 1223              Luvenia Heller, PA-C 09/26/22 1553    Fredia Sorrow, MD 09/27/22 [phone]

## 2022-09-26 NOTE — ED Notes (Signed)
Discharge instructions reviewed with patient. Patient verbalizes understanding, no further questions at this time. Medications/prescriptions and follow up information provided. No acute distress noted at time of departure.  

## 2022-09-27 ENCOUNTER — Ambulatory Visit: Payer: 59

## 2022-09-28 ENCOUNTER — Ambulatory Visit
Admission: RE | Admit: 2022-09-28 | Discharge: 2022-09-28 | Disposition: A | Discharge status: Home / Self Care | Payer: 59 | Source: Ambulatory Visit | Attending: Nurse Practitioner | Admitting: Nurse Practitioner

## 2022-09-28 DIAGNOSIS — Z780 Asymptomatic menopausal state: Secondary | ICD-10-CM | POA: Diagnosis not present

## 2022-09-28 NOTE — Progress Notes (Signed)
Normal Repeat in 79yrs

## 2022-10-01 ENCOUNTER — Ambulatory Visit: Payer: 59 | Admitting: Physical Therapy

## 2022-10-02 ENCOUNTER — Other Ambulatory Visit (HOSPITAL_COMMUNITY): Payer: Self-pay

## 2022-10-02 MED ORDER — PANTOPRAZOLE SODIUM 40 MG PO TBEC
40.0000 mg | DELAYED_RELEASE_TABLET | Freq: Every day | ORAL | 0 refills | Status: DC
  Filled 2022-10-02: qty 90, 90d supply, fill #0

## 2022-10-03 ENCOUNTER — Ambulatory Visit: Payer: 59 | Attending: Sports Medicine | Admitting: Physical Therapy

## 2022-10-03 ENCOUNTER — Other Ambulatory Visit: Payer: Self-pay

## 2022-10-03 ENCOUNTER — Encounter: Payer: Self-pay | Admitting: Physical Therapy

## 2022-10-03 DIAGNOSIS — R252 Cramp and spasm: Secondary | ICD-10-CM

## 2022-10-03 DIAGNOSIS — M25512 Pain in left shoulder: Secondary | ICD-10-CM | POA: Diagnosis present

## 2022-10-03 DIAGNOSIS — G8929 Other chronic pain: Secondary | ICD-10-CM | POA: Diagnosis present

## 2022-10-03 DIAGNOSIS — M6281 Muscle weakness (generalized): Secondary | ICD-10-CM | POA: Insufficient documentation

## 2022-10-03 DIAGNOSIS — M25511 Pain in right shoulder: Secondary | ICD-10-CM | POA: Insufficient documentation

## 2022-10-03 NOTE — Therapy (Signed)
OUTPATIENT PHYSICAL THERAPY SHOULDER TREATMENT   Patient Name: Shelly Cohen MRN: [id number] DOB:1960/10/22, 62 y.o., female Today's Date: 10/03/2022  END OF SESSION:  PT End of Session - 10/03/22 1722     Visit Number 5    Date for PT Re-Evaluation 11/28/22    PT Start Time [id number]    PT Stop Time [id number]    PT Time Calculation (min) 40 min    Activity Tolerance Patient limited by pain    Behavior During Therapy [facility] for tasks assessed/performed               Past Medical History:  Diagnosis Date   Alpha thalassemia trait    Arthritis    hips   Fibroadenoma    Glaucoma    monitored every 27months, right eye worse than left    History of positive PPD, treatment status unknown    Hypertension    OSA (obstructive sleep apnea)    mild with AHI 8/hr   Pre-diabetes    Thyroid disease    Past Surgical History:  Procedure Laterality Date   ABDOMINAL HYSTERECTOMY  2013   BREAST BIOPSY  2013   BUNIONECTOMY  10/2014   CHOLECYSTECTOMY  2015   COLONOSCOPY     hallux limi Right 2019   great  toe revision   TOTAL HIP ARTHROPLASTY Right 09/12/2018   Procedure: RIGHT TOTAL HIP ARTHROPLASTY ANTERIOR APPROACH;  Surgeon: Mcarthur Rossetti, MD;  Location: [facility];  Service: Orthopedics;  Laterality: Right;   TUBAL LIGATION  1990   UPPER GI ENDOSCOPY N/A 02/20/2021   Procedure: UPPER GI ENDOSCOPY;  Surgeon: Greer Pickerel, MD;  Location: [facility];  Service: General;  Laterality: N/A;   Patient Active Problem List   Diagnosis Date Noted   Myalgia 04/05/2022   White coat syndrome with diagnosis of hypertension 03/22/2022   Chronic hip pain after total replacement of right hip joint 08/14/2021   Numbness and tingling of foot 04/04/2021   S/P laparoscopic sleeve gastrectomy 02/20/2021   OSA (obstructive sleep apnea) 02/09/2020   DJD (degenerative joint disease) 02/09/2020   DM (diabetes mellitus) 08/13/2019   Primary insomnia 08/13/2019   Status post total replacement of right  hip 09/12/2018   Obesity (BMI 30-39.9) 08/22/2018   Trigger thumb, left thumb 05/07/2018   OA (osteoarthritis) of hip 04/22/2018   Open angle primary glaucoma 01/08/2018   Thalassemia trait, alpha 06/29/2016   Iron deficiency anemia 06/29/2016   S/P bunionectomy 06/29/2016   HTN (hypertension), benign 05/18/2016   Hypothyroidism 05/18/2016    PCP: Flossie Buffy, NP  REFERRING PROVIDER: Thurman Coyer, DO  REFERRING DIAG:  Diagnosis  M25.511,G89.29,M25.512 (ICD-10-CM) - Chronic pain of both shoulders    THERAPY DIAG:  Chronic pain of both shoulders  Cramp and spasm  Muscle weakness (generalized)  Rationale for Evaluation and Treatment: Rehabilitation  ONSET DATE: 08/17/22  SUBJECTIVE:  SUBJECTIVE STATEMENT: Patient reports that she felt pretty good after her last treatment. However, she received a filling at the dentist and her positioning in the chair caused severe back pain. She was prescribed muscle relaxers, which resolved the issue.   PERTINENT HISTORY: R THR 2019  PAIN:  Are you having pain? Yes: NPRS scale: 7/10 Pain location: B shoulders, R > L Pain description: sharp, wakes her up, pops during the day, sometimes hurts. Aggravating factors: Lying down Relieving factors: Lying on her back with shoulders and arms supported.  PRECAUTIONS: None  WEIGHT BEARING RESTRICTIONS: No  FALLS:  Has patient fallen in last 6 months? No  LIVING ENVIRONMENT: Lives with: lives with their family Lives in: House/apartment Stairs: No issues Has following equipment at home: None  OCCUPATION: Works from home on computer, has standing desk  PLOF: Independent  PATIENT GOALS:Be able to return to normal daily activities without shoulder pain. Sleep without shoulder pain.  NEXT MD  VISIT:   OBJECTIVE:   DIAGNOSTIC FINDINGS:  X rays 08/16/22 1. No acute osseous injury of bilateral shoulders.   PATIENT SURVEYS:  FOTO 56.6  COGNITION: Overall cognitive status: Within functional limits for tasks assessed     SENSATION: WFL  POSTURE: B shoulders hiked.  UPPER EXTREMITY ROM:   Active ROM Right eval Left eval  Shoulder flexion 52 75  Shoulder extension    Shoulder abduction 54 48  Shoulder adduction    Shoulder internal rotation    Shoulder external rotation 12 8  Elbow flexion    Elbow extension    Wrist flexion    Wrist extension    Wrist ulnar deviation    Wrist radial deviation    Wrist pronation    Wrist supination    (Blank rows = not tested)  UPPER EXTREMITY MMT: Shoulders deferred as patient could not tolerate due to pain, the rest of UE 4/5.   SHOULDER SPECIAL TESTS: Impingement tests: Hawkins/Kennedy impingement test: positive  and Painful arc test: positive  Rotator cuff assessment: Drop arm test: positive  and Empty can test: positive  Biceps assessment: Speed's test: positive   JOINT MOBILITY TESTING:  B shoulders with severely limited mobility in all planes, fearful and guarding. B scapular mobility limited due to muscle guarding and pain.  PALPATION:  Very tight and TTP B cervical paraspinals, ant neck, up traps, LS, medial scapular muscles. Supraspinatus tendon and biceps tendon TTP, tight pects   TODAY'S TREATMENT:                                                                                                                                         DATE:  10/03/22 NuStep with arms and legs-UBE aggravated pain. L3 x 6 minutes Shoulder depression with UT stretch-In sitting, hold side of chair with R arm, lean to L, then tilt head to the L until stretch is felt. Repeat 3 x  15 to each side. Thoracic mobilizations in sitting, lean back over towel roll Thread the needle in standing with rotation back and up to the ceiling, 6 x to  each side. Shoulder ext, rows with G tband, 2 x 10. MH and estim for pain relief to neck and UT  09/19/22 UBE in stand, L1 2 min forward and back Standing B shoulder ext with dowel x 10 reps Standing with back to wall, ball behind head to promote chin tuck, shoulder flex with dowel, emphasizing raising distally and keeping shoulders down. Repeat with abd x 5 each direction Seated scapular retraction Attempted baby eagle stretch, but too painful Upper traps stretch MH and IFC estim to neck and shoulders for pain relief.  09/17/22 MH to neck and upper shoulders, 5 minutes Seated lower trunk ant/post weight shifts Seated scapular retraction with shoulders depressed. Seated reach with rotation behind trunk, sliding hand on mat. Seated reach with rotation across her body with shoulder depression Standing shoulder flex, pushing towel across mat and back x 10 reps Seated chin tucks against ball on wall. Standing B shoulder ext with dowel x 10 reps Standing upper traps stretch, holding wrist behind trunk and gently pulling to R, with R lateral neck flexion. Supine MH and e-stim to neck and upper shoulders, IFC, x 12 minutes to prevent rebound pain after activity.  09/13/22 UBE 1 min forwards and backwards- c/o of sharp pain in R shoulder Wall slides x10 STM to neck and UT Shoulder flexion forward rolling blue pball x10  PROM to both shoulders AAROM supine flexion with dowel x10  Standing abd with dowel x10  Standing IR with dowel x10 Standing shoulder flexion with dowel x10  Standing extension with dowel x10  09/05/22 Education Pedulum exercises and deep breathing to relax arm in pendulum position.   PATIENT EDUCATION: Education details: POC, Person educated: Patient Education method: Customer service manager Education comprehension: verbalized understanding and returned demonstration  HOME EXERCISE PROGRAM: Pendulum exercises for B shoulders, deep breathing to relax shoulder  and facilitate some natural traction. AAROM with dowel flexion, IR, abd, extension  ASSESSMENT:  CLINICAL IMPRESSION: Patient tolerated her treatment well last time. She developed low back spasm and pain, but it has since resolved. Attempted to progress with self stretching and mobilizations. Added Thoracic mobs. Remains very tight and stiff in UT.  OBJECTIVE IMPAIRMENTS: decreased activity tolerance, decreased endurance, decreased mobility, decreased ROM, decreased strength, hypomobility, increased fascial restrictions, increased muscle spasms, impaired flexibility, impaired UE functional use, improper body mechanics, postural dysfunction, and pain.   ACTIVITY LIMITATIONS: carrying, lifting, bending, squatting, sleeping, bed mobility, dressing, reach over head, and hygiene/grooming  PARTICIPATION LIMITATIONS: cleaning, laundry, shopping, and community activity  PERSONAL FACTORS: Past/current experiences are also affecting patient's functional outcome.   REHAB POTENTIAL: Good  CLINICAL DECISION MAKING: Evolving/moderate complexity  EVALUATION COMPLEXITY: High   GOALS: Goals reviewed with patient? Yes  SHORT TERM GOALS: Target date: 09/20/22  I with initial HEP Baseline: Goal status: 10/03/22 met  LONG TERM GOALS: Target date: 11/28/22  I with final HEP Baseline:  Goal status: INITIAL  2.  Increase FOTO to at least 67 Baseline: 56.6 Goal status: INITIAL  3.  Patient will achieve full AROM in B shoulders with pain < 3/10 Baseline: Max elevation of 75 degrees with severe pain. Goal status: 09/19/22-ongoing  4.  Patient will demosntrate B shoulder strength of at least 4/5, with pain < 3/10 Baseline: Unable to tolerate any resistance or move against gravity due to  pain. Goal status: 10/03/22, ongoing  5.  Patient will be able to sleep x at least 6 hours without waking due to pain in shoulders Baseline: 1-2 hours Goal status: INITIAL  6.  Patient will be able to perform all  self care and daily activities with B shoulder < 3/10, no compensations needed. Baseline:  Goal status: INITIAL  PLAN:  PT FREQUENCY: 1-2x/week  PT DURATION: 12 weeks  PLANNED INTERVENTIONS: Therapeutic exercises, Therapeutic activity, Neuromuscular re-education, Balance training, Gait training, Patient/Family education, Self Care, Joint mobilization, Dry Needling, Electrical stimulation, Spinal mobilization, Cryotherapy, Moist heat, Taping, Vasopneumatic device, Ultrasound, Ionotophoresis 4mg /ml Dexamethasone, and Manual therapy  PLAN FOR NEXT SESSION: Try some gentle STM again to see if she tolerates it better.  Ethel Rana DPT 10/03/22 5:54 PM

## 2022-10-04 ENCOUNTER — Ambulatory Visit: Payer: No Typology Code available for payment source | Admitting: Nurse Practitioner

## 2022-10-08 ENCOUNTER — Encounter: Payer: Self-pay | Admitting: Physical Therapy

## 2022-10-08 ENCOUNTER — Ambulatory Visit: Payer: 59 | Admitting: Physical Therapy

## 2022-10-08 DIAGNOSIS — M6281 Muscle weakness (generalized): Secondary | ICD-10-CM

## 2022-10-08 DIAGNOSIS — R252 Cramp and spasm: Secondary | ICD-10-CM

## 2022-10-08 DIAGNOSIS — M25511 Pain in right shoulder: Secondary | ICD-10-CM | POA: Diagnosis not present

## 2022-10-08 DIAGNOSIS — G8929 Other chronic pain: Secondary | ICD-10-CM

## 2022-10-08 NOTE — Therapy (Signed)
OUTPATIENT PHYSICAL THERAPY SHOULDER TREATMENT   Patient Name: Shelly RossettiKathy Cohen MRN: [id number] DOB:1961/01/09, 62 y.o., female Today's Date: 10/08/2022  END OF SESSION:  PT End of Session - 10/08/22 1720     Visit Number 6    Date for PT Re-Evaluation 11/28/22    PT Start Time 1716    PT Stop Time 1755    PT Time Calculation (min) 39 min    Activity Tolerance Patient limited by pain    [facility] for tasks assessed/performed                Past Medical History:  Diagnosis Date   Alpha thalassemia trait    Arthritis    hips   Fibroadenoma    Glaucoma    monitored every 6months, right eye worse than left    History of positive PPD, treatment status unknown    Hypertension    OSA (obstructive sleep apnea)    mild with AHI 8/hr   Pre-diabetes    Thyroid disease    Past Surgical History:  Procedure Laterality Date   ABDOMINAL HYSTERECTOMY  2013   BREAST BIOPSY  2013   BUNIONECTOMY  10/2014   CHOLECYSTECTOMY  2015   COLONOSCOPY     hallux limi Right 2019   great  toe revision   TOTAL HIP ARTHROPLASTY Right 09/12/2018   Procedure: RIGHT TOTAL HIP ARTHROPLASTY ANTERIOR APPROACH;  Surgeon: Kathryne HitchBlackman, Shelly Y, MD;  Location: [facility];  Service: Orthopedics;  Laterality: Right;   TUBAL LIGATION  1990   UPPER GI ENDOSCOPY N/A 02/20/2021   Procedure: UPPER GI ENDOSCOPY;  Surgeon: Gaynelle AduWilson, Eric, MD;  Location: [facility];  Service: General;  Laterality: N/A;   Patient Active Problem List   Diagnosis Date Noted   Myalgia 04/05/2022   White coat syndrome with diagnosis of hypertension 03/22/2022   Chronic hip pain after total replacement of right hip joint 08/14/2021   Numbness and tingling of foot 04/04/2021   S/P laparoscopic sleeve gastrectomy 02/20/2021   OSA (obstructive sleep apnea) 02/09/2020   DJD (degenerative joint disease) 02/09/2020   DM (diabetes mellitus) 08/13/2019   Primary insomnia 08/13/2019   Status post total replacement of  right hip 09/12/2018   Obesity (BMI 30-39.9) 08/22/2018   Trigger thumb, left thumb 05/07/2018   OA (osteoarthritis) of hip 04/22/2018   Open angle primary glaucoma 01/08/2018   Thalassemia trait, alpha 06/29/2016   Iron deficiency anemia 06/29/2016   S/P bunionectomy 06/29/2016   HTN (hypertension), benign 05/18/2016   Hypothyroidism 05/18/2016    PCP: Shelly Cohen, Shelly lum, NP  REFERRING PROVIDER: Ralene Cohen, Shelly R, DO  REFERRING DIAG:  Diagnosis  M25.511,G89.29,M25.512 (ICD-10-CM) - Chronic pain of both shoulders    THERAPY DIAG:  Chronic pain of both shoulders  Cramp and spasm  Muscle weakness (generalized)  Rationale for Evaluation and Treatment: Rehabilitation  ONSET DATE: 08/17/22  SUBJECTIVE:  SUBJECTIVE STATEMENT: Patient reports that her shoulder and neck pain remain largely unchanged. Her LBP returned over the weekend. It responds well to heat or ice.   PERTINENT HISTORY: R THR 2019  PAIN:  Are you having pain? Yes: NPRS scale: 7/10 Pain location: B shoulders, R > L Pain description: sharp, wakes her up, pops during the day, sometimes hurts. Aggravating factors: Lying down Relieving factors: Lying on her back with shoulders and arms supported.  PRECAUTIONS: None  WEIGHT BEARING RESTRICTIONS: No  FALLS:  Has patient fallen in last 6 months? No  LIVING ENVIRONMENT: Lives with: lives with their family Lives in: House/apartment Stairs: No issues Has following equipment at home: None  OCCUPATION: Works from home on computer, has standing desk  PLOF: Independent  PATIENT GOALS:Be able to return to normal daily activities without shoulder pain. Sleep without shoulder pain.  NEXT MD VISIT:   OBJECTIVE:   DIAGNOSTIC FINDINGS:  X rays 08/16/22 1. No acute osseous injury  of bilateral shoulders.   PATIENT SURVEYS:  FOTO 56.6  COGNITION: Overall cognitive status: Within functional limits for tasks assessed     SENSATION: WFL  POSTURE: B shoulders hiked.  UPPER EXTREMITY ROM:   Active ROM Right eval Left eval  Shoulder flexion 52 75  Shoulder extension    Shoulder abduction 54 48  Shoulder adduction    Shoulder internal rotation    Shoulder external rotation 12 8  Elbow flexion    Elbow extension    Wrist flexion    Wrist extension    Wrist ulnar deviation    Wrist radial deviation    Wrist pronation    Wrist supination    (Blank rows = not tested)  UPPER EXTREMITY MMT: Shoulders deferred as patient could not tolerate due to pain, the rest of UE 4/5.   SHOULDER SPECIAL TESTS: Impingement tests: Hawkins/Kennedy impingement test: positive  and Painful arc test: positive  Rotator cuff assessment: Drop arm test: positive  and Empty can test: positive  Biceps assessment: Speed's test: positive   JOINT MOBILITY TESTING:  B shoulders with severely limited mobility in all planes, fearful and guarding. B scapular mobility limited due to muscle guarding and pain.  PALPATION:  Very tight and TTP B cervical paraspinals, ant neck, up traps, LS, medial scapular muscles. Supraspinatus tendon and biceps tendon TTP, tight pects   TODAY'S TREATMENT:                                                                                                                                         DATE:  10/08/22 NuStep with arms and legs, L5 x 6 minutes. Cat cow in quadruped x 10 Childs pose 5 x 10 sec Supine low back stretch-SKTC, DKTC,  pelvic tilt-5 sec hold x 10 reps Supine march with pelvic tilt for trunk stability. R lateral weight shift, sliding hand out to the side and back  with lower body weight shift to elongate R side, repeat x 5 in both directions. Massage gun to B UT, cerv paraspinals, patient controlled.-Felt excellent relief. Postural strength  and stability exer in stand, shoulder ext, rows, 5#, 2 x 10 each  10/03/22 NuStep with arms and legs-UBE aggravated pain. L3 x 6 minutes Shoulder depression with UT stretch-In sitting, hold side of chair with R arm, lean to L, then tilt head to the L until stretch is felt. Repeat 3 x 15 to each side. Thoracic mobilizations in sitting, lean back over towel roll Thread the needle in standing with rotation back and up to the ceiling, 6 x to each side. Shoulder ext, rows with G tband, 2 x 10. MH and estim for pain relief to neck and UT  09/19/22 UBE in stand, L1 2 min forward and back Standing B shoulder ext with dowel x 10 reps Standing with back to wall, ball behind head to promote chin tuck, shoulder flex with dowel, emphasizing raising distally and keeping shoulders down. Repeat with abd x 5 each direction Seated scapular retraction Attempted baby eagle stretch, but too painful Upper traps stretch MH and IFC estim to neck and shoulders for pain relief.  09/17/22 MH to neck and upper shoulders, 5 minutes Seated lower trunk ant/post weight shifts Seated scapular retraction with shoulders depressed. Seated reach with rotation behind trunk, sliding hand on mat. Seated reach with rotation across her body with shoulder depression Standing shoulder flex, pushing towel across mat and back x 10 reps Seated chin tucks against ball on wall. Standing B shoulder ext with dowel x 10 reps Standing upper traps stretch, holding wrist behind trunk and gently pulling to R, with R lateral neck flexion. Supine MH and e-stim to neck and upper shoulders, IFC, x 12 minutes to prevent rebound pain after activity.  09/13/22 UBE 1 min forwards and backwards- c/o of sharp pain in R shoulder Wall slides x10 STM to neck and UT Shoulder flexion forward rolling blue pball x10  PROM to both shoulders AAROM supine flexion with dowel x10  Standing abd with dowel x10  Standing IR with dowel x10 Standing shoulder  flexion with dowel x10  Standing extension with dowel x10  09/05/22 Education Pedulum exercises and deep breathing to relax arm in pendulum position.   PATIENT EDUCATION: Education details: POC, Person educated: Patient Education method: Medical illustrator Education comprehension: verbalized understanding and returned demonstration  HOME EXERCISE PROGRAM: Pendulum exercises for B shoulders, deep breathing to relax shoulder and facilitate some natural traction. AAROM with dowel flexion, IR, abd, extension  ASSESSMENT:  CLINICAL IMPRESSION: Patient reports that her LBP returned over the weekend. Treatment focused on progressing her mobilizations for trunk, including lower body today to address stiffness in low back. She responded very well to massage gun, plans to purchase one for use at home.  OBJECTIVE IMPAIRMENTS: decreased activity tolerance, decreased endurance, decreased mobility, decreased ROM, decreased strength, hypomobility, increased fascial restrictions, increased muscle spasms, impaired flexibility, impaired UE functional use, improper body mechanics, postural dysfunction, and pain.   ACTIVITY LIMITATIONS: carrying, lifting, bending, squatting, sleeping, bed mobility, dressing, reach over head, and hygiene/grooming  PARTICIPATION LIMITATIONS: cleaning, laundry, shopping, and community activity  PERSONAL FACTORS: Past/current experiences are also affecting patient's functional outcome.   REHAB POTENTIAL: Good  CLINICAL DECISION MAKING: Evolving/moderate complexity  EVALUATION COMPLEXITY: High   GOALS: Goals reviewed with patient? Yes  SHORT TERM GOALS: Target date: 09/20/22  I with initial HEP Baseline: Goal status: 10/03/22  met  LONG TERM GOALS: Target date: 11/28/22  I with final HEP Baseline:  Goal status: INITIAL  2.  Increase FOTO to at least 67 Baseline: 56.6 Goal status: INITIAL  3.  Patient will achieve full AROM in B shoulders with pain  < 3/10 Baseline: Max elevation of 75 degrees with severe pain. Goal status: 09/19/22-ongoing  4.  Patient will demosntrate B shoulder strength of at least 4/5, with pain < 3/10 Baseline: Unable to tolerate any resistance or move against gravity due to pain. Goal status: 10/03/22, ongoing  5.  Patient will be able to sleep x at least 6 hours without waking due to pain in shoulders Baseline: 1-2 hours Goal status: INITIAL  6.  Patient will be able to perform all self care and daily activities with B shoulder < 3/10, no compensations needed. Baseline:  Goal status: INITIAL  PLAN:  PT FREQUENCY: 1-2x/week  PT DURATION: 12 weeks  PLANNED INTERVENTIONS: Therapeutic exercises, Therapeutic activity, Neuromuscular re-education, Balance training, Gait training, Patient/Family education, Self Care, Joint mobilization, Dry Needling, Electrical stimulation, Spinal mobilization, Cryotherapy, Moist heat, Taping, Vasopneumatic device, Ultrasound, Ionotophoresis 4mg /ml Dexamethasone, and Manual therapy  PLAN FOR NEXT SESSION: Try some gentle STM again to see if she tolerates it better.  Oley Balm DPT 10/08/22 6:55 PM

## 2022-10-10 ENCOUNTER — Ambulatory Visit: Payer: 59 | Admitting: Physical Therapy

## 2022-10-10 ENCOUNTER — Encounter: Payer: Self-pay | Admitting: Physical Therapy

## 2022-10-10 DIAGNOSIS — M6281 Muscle weakness (generalized): Secondary | ICD-10-CM

## 2022-10-10 DIAGNOSIS — M25511 Pain in right shoulder: Secondary | ICD-10-CM | POA: Diagnosis not present

## 2022-10-10 DIAGNOSIS — R252 Cramp and spasm: Secondary | ICD-10-CM

## 2022-10-10 DIAGNOSIS — G8929 Other chronic pain: Secondary | ICD-10-CM

## 2022-10-10 NOTE — Therapy (Signed)
OUTPATIENT PHYSICAL THERAPY SHOULDER TREATMENT   Patient Name: Shelly Cohen MRN: [id number] DOB:11/12/1960, 62 y.o., female Today's Date: 10/10/2022  END OF SESSION:  PT End of Session - 10/10/22 1558     Visit Number 7    Date for PT Re-Evaluation 11/28/22    PT Start Time 1600    PT Stop Time 1645    PT Time Calculation (min) 45 min    Activity Tolerance Patient limited by pain    Behavior During Therapy [facility] for tasks assessed/performed                Past Medical History:  Diagnosis Date   Alpha thalassemia trait    Arthritis    hips   Fibroadenoma    Glaucoma    monitored every 6months, right eye worse than left    History of positive PPD, treatment status unknown    Hypertension    OSA (obstructive sleep apnea)    mild with AHI 8/hr   Pre-diabetes    Thyroid disease    Past Surgical History:  Procedure Laterality Date   ABDOMINAL HYSTERECTOMY  2013   BREAST BIOPSY  2013   BUNIONECTOMY  10/2014   CHOLECYSTECTOMY  2015   COLONOSCOPY     hallux limi Right 2019   great  toe revision   TOTAL HIP ARTHROPLASTY Right 09/12/2018   Procedure: RIGHT TOTAL HIP ARTHROPLASTY ANTERIOR APPROACH;  Surgeon: Kathryne HitchBlackman, Christopher Y, MD;  Location: [facility];  Service: Orthopedics;  Laterality: Right;   TUBAL LIGATION  1990   UPPER GI ENDOSCOPY N/A 02/20/2021   Procedure: UPPER GI ENDOSCOPY;  Surgeon: Gaynelle AduWilson, Eric, MD;  Location: [facility];  Service: General;  Laterality: N/A;   Patient Active Problem List   Diagnosis Date Noted   Myalgia 04/05/2022   White coat syndrome with diagnosis of hypertension 03/22/2022   Chronic hip pain after total replacement of right hip joint 08/14/2021   Numbness and tingling of foot 04/04/2021   S/P laparoscopic sleeve gastrectomy 02/20/2021   OSA (obstructive sleep apnea) 02/09/2020   DJD (degenerative joint disease) 02/09/2020   DM (diabetes mellitus) 08/13/2019   Primary insomnia 08/13/2019   Status post total replacement of  right hip 09/12/2018   Obesity (BMI 30-39.9) 08/22/2018   Trigger thumb, left thumb 05/07/2018   OA (osteoarthritis) of hip 04/22/2018   Open angle primary glaucoma 01/08/2018   Thalassemia trait, alpha 06/29/2016   Iron deficiency anemia 06/29/2016   S/P bunionectomy 06/29/2016   HTN (hypertension), benign 05/18/2016   Hypothyroidism 05/18/2016    PCP: Anne NgNche, Charlotte lum, NP  REFERRING PROVIDER: Ralene Corkraper, Timothy R, DO  REFERRING DIAG:  Diagnosis  M25.511,G89.29,M25.512 (ICD-10-CM) - Chronic pain of both shoulders    THERAPY DIAG:  Chronic pain of both shoulders  Cramp and spasm  Muscle weakness (generalized)  Rationale for Evaluation and Treatment: Rehabilitation  ONSET DATE: 08/17/22  SUBJECTIVE:  SUBJECTIVE STATEMENT: Doing ok today   PERTINENT HISTORY: R THR 2019  PAIN:  Are you having pain? Yes: NPRS scale: 0/10 Pain location: B shoulders, R > L Pain description: sharp, wakes her up, pops during the day, sometimes hurts. Aggravating factors: Lying down Relieving factors: Lying on her back with shoulders and arms supported.  PRECAUTIONS: None  WEIGHT BEARING RESTRICTIONS: No  FALLS:  Has patient fallen in last 6 months? No  LIVING ENVIRONMENT: Lives with: lives with their family Lives in: House/apartment Stairs: No issues Has following equipment at home: None  OCCUPATION: Works from home on computer, has standing desk  PLOF: Independent  PATIENT GOALS:Be able to return to normal daily activities without shoulder pain. Sleep without shoulder pain.  NEXT MD VISIT:   OBJECTIVE:   DIAGNOSTIC FINDINGS:  X rays 08/16/22 1. No acute osseous injury of bilateral shoulders.   PATIENT SURVEYS:  FOTO 56.6  COGNITION: Overall cognitive status: Within functional limits for  tasks assessed     SENSATION: WFL  POSTURE: B shoulders hiked.  UPPER EXTREMITY ROM:   Active ROM Right eval Left eval  Shoulder flexion 52 75  Shoulder extension    Shoulder abduction 54 48  Shoulder adduction    Shoulder internal rotation    Shoulder external rotation 12 8  Elbow flexion    Elbow extension    Wrist flexion    Wrist extension    Wrist ulnar deviation    Wrist radial deviation    Wrist pronation    Wrist supination    (Blank rows = not tested)  UPPER EXTREMITY MMT: Shoulders deferred as patient could not tolerate due to pain, the rest of UE 4/5.   SHOULDER SPECIAL TESTS: Impingement tests: Hawkins/Kennedy impingement test: positive  and Painful arc test: positive  Rotator cuff assessment: Drop arm test: positive  and Empty can test: positive  Biceps assessment: Speed's test: positive   JOINT MOBILITY TESTING:  B shoulders with severely limited mobility in all planes, fearful and guarding. B scapular mobility limited due to muscle guarding and pain.  PALPATION:  Very tight and TTP B cervical paraspinals, ant neck, up traps, LS, medial scapular muscles. Supraspinatus tendon and biceps tendon TTP, tight pects   TODAY'S TREATMENT:                                                                                                                                         DATE:  10/10/22 UBE L1 x22min AAROM Flex, Ext, IR up back 2lb WaTE x10 Shoulder Rows & Ext red 2x10 ER yellow 2x10 Shoulder IR yellow 2x10 each PROM of both shoulders  PROM of R hip.  10/08/22 NuStep with arms and legs, L5 x 6 minutes. Cat cow in quadruped x 10 Childs pose 5 x 10 sec Supine low back stretch-SKTC, DKTC,  pelvic tilt-5 sec hold x 10 reps Supine march with pelvic  tilt for trunk stability. R lateral weight shift, sliding hand out to the side and back with lower body weight shift to elongate R side, repeat x 5 in both directions. Massage gun to B UT, cerv paraspinals,  patient controlled.-Felt excellent relief. Postural strength and stability exer in stand, shoulder ext, rows, 5#, 2 x 10 each  10/03/22 NuStep with arms and legs-UBE aggravated pain. L3 x 6 minutes Shoulder depression with UT stretch-In sitting, hold side of chair with R arm, lean to L, then tilt head to the L until stretch is felt. Repeat 3 x 15 to each side. Thoracic mobilizations in sitting, lean back over towel roll Thread the needle in standing with rotation back and up to the ceiling, 6 x to each side. Shoulder ext, rows with G tband, 2 x 10. MH and estim for pain relief to neck and UT  09/19/22 UBE in stand, L1 2 min forward and back Standing B shoulder ext with dowel x 10 reps Standing with back to wall, ball behind head to promote chin tuck, shoulder flex with dowel, emphasizing raising distally and keeping shoulders down. Repeat with abd x 5 each direction Seated scapular retraction Attempted baby eagle stretch, but too painful Upper traps stretch MH and IFC estim to neck and shoulders for pain relief.  09/17/22 MH to neck and upper shoulders, 5 minutes Seated lower trunk ant/post weight shifts Seated scapular retraction with shoulders depressed. Seated reach with rotation behind trunk, sliding hand on mat. Seated reach with rotation across her body with shoulder depression Standing shoulder flex, pushing towel across mat and back x 10 reps Seated chin tucks against ball on wall. Standing B shoulder ext with dowel x 10 reps Standing upper traps stretch, holding wrist behind trunk and gently pulling to R, with R lateral neck flexion. Supine MH and e-stim to neck and upper shoulders, IFC, x 12 minutes to prevent rebound pain after activity.  09/13/22 UBE 1 min forwards and backwards- c/o of sharp pain in R shoulder Wall slides x10 STM to neck and UT Shoulder flexion forward rolling blue pball x10  PROM to both shoulders AAROM supine flexion with dowel x10  Standing abd with  dowel x10  Standing IR with dowel x10 Standing shoulder flexion with dowel x10  Standing extension with dowel x10  09/05/22 Education Pedulum exercises and deep breathing to relax arm in pendulum position.   PATIENT EDUCATION: Education details: POC, Person educated: Patient Education method: Medical illustrator Education comprehension: verbalized understanding and returned demonstration  HOME EXERCISE PROGRAM: Pendulum exercises for B shoulders, deep breathing to relax shoulder and facilitate some natural traction. AAROM with dowel flexion, IR, abd, extension  ASSESSMENT:  CLINICAL IMPRESSION: Patient enters with reports of shoulder pain. Low back felt fine today. Treatment focused on progressing her shoulder and postural strength. Postural cues needed with rows and extensions. Some shoulder weakness noted with external rotation. Pt has good PROM in all directions but with some end range pan. R hip tightness present with PROM. for use at home.  OBJECTIVE IMPAIRMENTS: decreased activity tolerance, decreased endurance, decreased mobility, decreased ROM, decreased strength, hypomobility, increased fascial restrictions, increased muscle spasms, impaired flexibility, impaired UE functional use, improper body mechanics, postural dysfunction, and pain.   ACTIVITY LIMITATIONS: carrying, lifting, bending, squatting, sleeping, bed mobility, dressing, reach over head, and hygiene/grooming  PARTICIPATION LIMITATIONS: cleaning, laundry, shopping, and community activity  PERSONAL FACTORS: Past/current experiences are also affecting patient's functional outcome.   REHAB POTENTIAL: Good  CLINICAL  DECISION MAKING: Evolving/moderate complexity  EVALUATION COMPLEXITY: High   GOALS: Goals reviewed with patient? Yes  SHORT TERM GOALS: Target date: 09/20/22  I with initial HEP Baseline: Goal status: 10/03/22 met  LONG TERM GOALS: Target date: 11/28/22  I with final HEP Baseline:   Goal status: INITIAL  2.  Increase FOTO to at least 67 Baseline: 56.6 Goal status: INITIAL  3.  Patient will achieve full AROM in B shoulders with pain < 3/10 Baseline: Max elevation of 75 degrees with severe pain. Goal status: 09/19/22-ongoing  4.  Patient will demosntrate B shoulder strength of at least 4/5, with pain < 3/10 Baseline: Unable to tolerate any resistance or move against gravity due to pain. Goal status: 10/03/22, ongoing  5.  Patient will be able to sleep x at least 6 hours without waking due to pain in shoulders Baseline: 1-2 hours Goal status: INITIAL  6.  Patient will be able to perform all self care and daily activities with B shoulder < 3/10, no compensations needed. Baseline:  Goal status: INITIAL  PLAN:  PT FREQUENCY: 1-2x/week  PT DURATION: 12 weeks  PLANNED INTERVENTIONS: Therapeutic exercises, Therapeutic activity, Neuromuscular re-education, Balance training, Gait training, Patient/Family education, Self Care, Joint mobilization, Dry Needling, Electrical stimulation, Spinal mobilization, Cryotherapy, Moist heat, Taping, Vasopneumatic device, Ultrasound, Ionotophoresis 4mg /ml Dexamethasone, and Manual therapy  PLAN FOR NEXT SESSION: Try some gentle STM again to see if she tolerates it better.  Debroah Baller, PTA 10/10/22 3:59 PM

## 2022-10-17 ENCOUNTER — Other Ambulatory Visit: Payer: Self-pay

## 2022-10-17 ENCOUNTER — Ambulatory Visit: Payer: 59 | Admitting: Physical Therapy

## 2022-10-17 ENCOUNTER — Encounter: Payer: Self-pay | Admitting: Physical Therapy

## 2022-10-17 DIAGNOSIS — M25511 Pain in right shoulder: Secondary | ICD-10-CM | POA: Diagnosis not present

## 2022-10-17 DIAGNOSIS — M6281 Muscle weakness (generalized): Secondary | ICD-10-CM

## 2022-10-17 DIAGNOSIS — G8929 Other chronic pain: Secondary | ICD-10-CM

## 2022-10-17 DIAGNOSIS — R252 Cramp and spasm: Secondary | ICD-10-CM

## 2022-10-17 NOTE — Therapy (Signed)
OUTPATIENT PHYSICAL THERAPY SHOULDER TREATMENT   Patient Name: Shelly Cohen MRN: [id number] DOB:June 14, 1961, 62 y.o., female Today's Date: 10/17/2022  END OF SESSION:  PT End of Session - 10/17/22 1713     Visit Number 8    Date for PT Re-Evaluation 11/28/22    PT Start Time 1713    PT Stop Time 1755    PT Time Calculation (min) 42 min    Activity Tolerance Patient tolerated treatment well    Behavior During Therapy WFL for tasks assessed/performed                 Past Medical History:  Diagnosis Date   Alpha thalassemia trait    Arthritis    hips   Fibroadenoma    Glaucoma    monitored every 6months, right eye worse than left    History of positive PPD, treatment status unknown    Hypertension    OSA (obstructive sleep apnea)    mild with AHI 8/hr   Pre-diabetes    Thyroid disease    Past Surgical History:  Procedure Laterality Date   ABDOMINAL HYSTERECTOMY  2013   BREAST BIOPSY  2013   BUNIONECTOMY  10/2014   CHOLECYSTECTOMY  2015   COLONOSCOPY     hallux limi Right 2019   great  toe revision   TOTAL HIP ARTHROPLASTY Right 09/12/2018   Procedure: RIGHT TOTAL HIP ARTHROPLASTY ANTERIOR APPROACH;  Surgeon: Kathryne Hitch, MD;  Location: [facility];  Service: Orthopedics;  Laterality: Right;   TUBAL LIGATION  1990   UPPER GI ENDOSCOPY N/A 02/20/2021   Procedure: UPPER GI ENDOSCOPY;  Surgeon: Gaynelle Adu, MD;  Location: [facility];  Service: General;  Laterality: N/A;   Patient Active Problem List   Diagnosis Date Noted   Myalgia 04/05/2022   White coat syndrome with diagnosis of hypertension 03/22/2022   Chronic hip pain after total replacement of right hip joint 08/14/2021   Numbness and tingling of foot 04/04/2021   S/P laparoscopic sleeve gastrectomy 02/20/2021   OSA (obstructive sleep apnea) 02/09/2020   DJD (degenerative joint disease) 02/09/2020   DM (diabetes mellitus) 08/13/2019   Primary insomnia 08/13/2019   Status post total  replacement of right hip 09/12/2018   Obesity (BMI 30-39.9) 08/22/2018   Trigger thumb, left thumb 05/07/2018   OA (osteoarthritis) of hip 04/22/2018   Open angle primary glaucoma 01/08/2018   Thalassemia trait, alpha 06/29/2016   Iron deficiency anemia 06/29/2016   S/P bunionectomy 06/29/2016   HTN (hypertension), benign 05/18/2016   Hypothyroidism 05/18/2016    PCP: Anne Ng, NP  REFERRING PROVIDER: Ralene Cork, DO  REFERRING DIAG:  Diagnosis  M25.511,G89.29,M25.512 (ICD-10-CM) - Chronic pain of both shoulders    THERAPY DIAG:  Chronic pain of both shoulders  Cramp and spasm  Muscle weakness (generalized)  Rationale for Evaluation and Treatment: Rehabilitation  ONSET DATE: 08/17/22  SUBJECTIVE:  SUBJECTIVE STATEMENT: Doing ok today   PERTINENT HISTORY: R THR 2019  PAIN:  Are you having pain? Yes: NPRS scale: 0/10 Pain location: B shoulders, R > L Pain description: sharp, wakes her up, pops during the day, sometimes hurts. Aggravating factors: Lying down Relieving factors: Lying on her back with shoulders and arms supported.  PRECAUTIONS: None  WEIGHT BEARING RESTRICTIONS: No  FALLS:  Has patient fallen in last 6 months? No  LIVING ENVIRONMENT: Lives with: lives with their family Lives in: House/apartment Stairs: No issues Has following equipment at home: None  OCCUPATION: Works from home on computer, has standing desk  PLOF: Independent  PATIENT GOALS:Be able to return to normal daily activities without shoulder pain. Sleep without shoulder pain.  NEXT MD VISIT:   OBJECTIVE:   DIAGNOSTIC FINDINGS:  X rays 08/16/22 1. No acute osseous injury of bilateral shoulders.   PATIENT SURVEYS:  FOTO 56.6  COGNITION: Overall cognitive status: Within  functional limits for tasks assessed     SENSATION: WFL  POSTURE: B shoulders hiked.  UPPER EXTREMITY ROM:   Active ROM Right eval Left eval  Shoulder flexion 52 75  Shoulder extension    Shoulder abduction 54 48  Shoulder adduction    Shoulder internal rotation    Shoulder external rotation 12 8  Elbow flexion    Elbow extension    Wrist flexion    Wrist extension    Wrist ulnar deviation    Wrist radial deviation    Wrist pronation    Wrist supination    (Blank rows = not tested)  UPPER EXTREMITY MMT: Shoulders deferred as patient could not tolerate due to pain, the rest of UE 4/5.   SHOULDER SPECIAL TESTS: Impingement tests: Hawkins/Kennedy impingement test: positive  and Painful arc test: positive  Rotator cuff assessment: Drop arm test: positive  and Empty can test: positive  Biceps assessment: Speed's test: positive   JOINT MOBILITY TESTING:  B shoulders with severely limited mobility in all planes, fearful and guarding. B scapular mobility limited due to muscle guarding and pain.  PALPATION:  Very tight and TTP B cervical paraspinals, ant neck, up traps, LS, medial scapular muscles. Supraspinatus tendon and biceps tendon TTP, tight pects   TODAY'S TREATMENT:                                                                                                                                         DATE:  10/17/22 UBE L2 x 6 minutes Supine pelvic tilt, progress to bridge, clamshell, bridge with hip abd resistance- G tband, 10 reps each Supine march with pelvic tilt x 10 Side to side step with G tband at knees x 10 Mini squats x 10 reps Upper trunk stretch, thoracic rotation and lower traps Standing shoulder ext 10#, rows, ER, 5#, 2 x 10 each  10/10/22 UBE L1 x98min AAROM Flex, Ext, IR up back  2lb WaTE x10 Shoulder Rows & Ext red 2x10 ER yellow 2x10 Shoulder IR yellow 2x10 each PROM of both shoulders  PROM of R hip.  10/08/22 NuStep with arms and legs, L5 x 6  minutes. Cat cow in quadruped x 10 Childs pose 5 x 10 sec Supine low back stretch-SKTC, DKTC,  pelvic tilt-5 sec hold x 10 reps Supine march with pelvic tilt for trunk stability. R lateral weight shift, sliding hand out to the side and back with lower body weight shift to elongate R side, repeat x 5 in both directions. Massage gun to B UT, cerv paraspinals, patient controlled.-Felt excellent relief. Postural strength and stability exer in stand, shoulder ext, rows, 5#, 2 x 10 each  10/03/22 NuStep with arms and legs-UBE aggravated pain. L3 x 6 minutes Shoulder depression with UT stretch-In sitting, hold side of chair with R arm, lean to L, then tilt head to the L until stretch is felt. Repeat 3 x 15 to each side. Thoracic mobilizations in sitting, lean back over towel roll Thread the needle in standing with rotation back and up to the ceiling, 6 x to each side. Shoulder ext, rows with G tband, 2 x 10. MH and estim for pain relief to neck and UT  09/19/22 UBE in stand, L1 2 min forward and back Standing B shoulder ext with dowel x 10 reps Standing with back to wall, ball behind head to promote chin tuck, shoulder flex with dowel, emphasizing raising distally and keeping shoulders down. Repeat with abd x 5 each direction Seated scapular retraction Attempted baby eagle stretch, but too painful Upper traps stretch MH and IFC estim to neck and shoulders for pain relief.  09/17/22 MH to neck and upper shoulders, 5 minutes Seated lower trunk ant/post weight shifts Seated scapular retraction with shoulders depressed. Seated reach with rotation behind trunk, sliding hand on mat. Seated reach with rotation across her body with shoulder depression Standing shoulder flex, pushing towel across mat and back x 10 reps Seated chin tucks against ball on wall. Standing B shoulder ext with dowel x 10 reps Standing upper traps stretch, holding wrist behind trunk and gently pulling to R, with R lateral  neck flexion. Supine MH and e-stim to neck and upper shoulders, IFC, x 12 minutes to prevent rebound pain after activity.  09/13/22 UBE 1 min forwards and backwards- c/o of sharp pain in R shoulder Wall slides x10 STM to neck and UT Shoulder flexion forward rolling blue pball x10  PROM to both shoulders AAROM supine flexion with dowel x10  Standing abd with dowel x10  Standing IR with dowel x10 Standing shoulder flexion with dowel x10  Standing extension with dowel x10  09/05/22 Education Pedulum exercises and deep breathing to relax arm in pendulum position.   PATIENT EDUCATION: Education details: POC, Person educated: Patient Education method: Medical illustrator Education comprehension: verbalized understanding and returned demonstration  HOME EXERCISE PROGRAM: Pendulum exercises for B shoulders, deep breathing to relax shoulder and facilitate some natural traction. AAROM with dowel flexion, IR, abd, extension  [id number]  ASSESSMENT:  CLINICAL IMPRESSION: Patient reports much improved pain in both neck and lower back, moving much more freely today. Established HEP for both upper and lower body, patient tolerated all exercises today with no pain.  OBJECTIVE IMPAIRMENTS: decreased activity tolerance, decreased endurance, decreased mobility, decreased ROM, decreased strength, hypomobility, increased fascial restrictions, increased muscle spasms, impaired flexibility, impaired UE functional use, improper body mechanics, postural dysfunction, and pain.  ACTIVITY LIMITATIONS: carrying, lifting, bending, squatting, sleeping, bed mobility, dressing, reach over head, and hygiene/grooming  PARTICIPATION LIMITATIONS: cleaning, laundry, shopping, and community activity  PERSONAL FACTORS: Past/current experiences are also affecting patient's functional outcome.   REHAB POTENTIAL: Good  CLINICAL DECISION MAKING: Evolving/moderate complexity  EVALUATION COMPLEXITY:  High   GOALS: Goals reviewed with patient? Yes  SHORT TERM GOALS: Target date: 09/20/22  I with initial HEP Baseline: Goal status: 10/03/22 met  LONG TERM GOALS: Target date: 11/28/22  I with final HEP Baseline:  Goal status: INITIAL  2.  Increase FOTO to at least 67 Baseline: 56.6 Goal status: INITIAL  3.  Patient will achieve full AROM in B shoulders with pain < 3/10 Baseline: Max elevation of 75 degrees with severe pain. Goal status: 09/19/22-ongoing  4.  Patient will demosntrate B shoulder strength of at least 4/5, with pain < 3/10 Baseline: Unable to tolerate any resistance or move against gravity due to pain. Goal status: 10/17/22, met  5.  Patient will be able to sleep x at least 6 hours without waking due to pain in shoulders Baseline: 1-2 hours Goal status: INITIAL  6.  Patient will be able to perform all self care and daily activities with B shoulder < 3/10, no compensations needed. Baseline:  Goal status: INITIAL  PLAN:  PT FREQUENCY: 1-2x/week  PT DURATION: 12 weeks  PLANNED INTERVENTIONS: Therapeutic exercises, Therapeutic activity, Neuromuscular re-education, Balance training, Gait training, Patient/Family education, Self Care, Joint mobilization, Dry Needling, Electrical stimulation, Spinal mobilization, Cryotherapy, Moist heat, Taping, Vasopneumatic device, Ultrasound, Ionotophoresis 4mg /ml Dexamethasone, and Manual therapy  PLAN FOR NEXT SESSION: Try some gentle STM again to see if she tolerates it better.  Debroah Baller, PTA 10/17/22 5:58 PM

## 2022-10-22 ENCOUNTER — Ambulatory Visit: Payer: 59 | Admitting: Physical Therapy

## 2022-10-22 ENCOUNTER — Other Ambulatory Visit: Payer: Self-pay

## 2022-10-24 ENCOUNTER — Ambulatory Visit: Payer: 59 | Admitting: Physical Therapy

## 2022-10-24 ENCOUNTER — Encounter: Payer: Self-pay | Admitting: Physical Therapy

## 2022-10-24 DIAGNOSIS — G8929 Other chronic pain: Secondary | ICD-10-CM

## 2022-10-24 DIAGNOSIS — M25511 Pain in right shoulder: Secondary | ICD-10-CM | POA: Diagnosis not present

## 2022-10-24 DIAGNOSIS — M6281 Muscle weakness (generalized): Secondary | ICD-10-CM

## 2022-10-24 DIAGNOSIS — R252 Cramp and spasm: Secondary | ICD-10-CM

## 2022-10-24 NOTE — Therapy (Signed)
OUTPATIENT PHYSICAL THERAPY SHOULDER TREATMENT   Patient Name: Shelly Cohen MRN: [id number] DOB:1961-04-16, 62 y.o., female Today's Date: 10/24/2022  END OF SESSION:  PT End of Session - 10/24/22 1600     Visit Number 9    Date for PT Re-Evaluation 11/28/22    PT Start Time 1600    PT Stop Time 1645    PT Time Calculation (min) 45 min    Activity Tolerance Patient tolerated treatment well    Behavior During Therapy WFL for tasks assessed/performed                 Past Medical History:  Diagnosis Date   Alpha thalassemia trait    Arthritis    hips   Fibroadenoma    Glaucoma    monitored every 6months, right eye worse than left    History of positive PPD, treatment status unknown    Hypertension    OSA (obstructive sleep apnea)    mild with AHI 8/hr   Pre-diabetes    Thyroid disease    Past Surgical History:  Procedure Laterality Date   ABDOMINAL HYSTERECTOMY  2013   BREAST BIOPSY  2013   BUNIONECTOMY  10/2014   CHOLECYSTECTOMY  2015   COLONOSCOPY     hallux limi Right 2019   great  toe revision   TOTAL HIP ARTHROPLASTY Right 09/12/2018   Procedure: RIGHT TOTAL HIP ARTHROPLASTY ANTERIOR APPROACH;  Surgeon: Kathryne Hitch, MD;  Location: [facility];  Service: Orthopedics;  Laterality: Right;   TUBAL LIGATION  1990   UPPER GI ENDOSCOPY N/A 02/20/2021   Procedure: UPPER GI ENDOSCOPY;  Surgeon: Gaynelle Adu, MD;  Location: [facility];  Service: General;  Laterality: N/A;   Patient Active Problem List   Diagnosis Date Noted   Myalgia 04/05/2022   White coat syndrome with diagnosis of hypertension 03/22/2022   Chronic hip pain after total replacement of right hip joint 08/14/2021   Numbness and tingling of foot 04/04/2021   S/P laparoscopic sleeve gastrectomy 02/20/2021   OSA (obstructive sleep apnea) 02/09/2020   DJD (degenerative joint disease) 02/09/2020   DM (diabetes mellitus) 08/13/2019   Primary insomnia 08/13/2019   Status post total  replacement of right hip 09/12/2018   Obesity (BMI 30-39.9) 08/22/2018   Trigger thumb, left thumb 05/07/2018   OA (osteoarthritis) of hip 04/22/2018   Open angle primary glaucoma 01/08/2018   Thalassemia trait, alpha 06/29/2016   Iron deficiency anemia 06/29/2016   S/P bunionectomy 06/29/2016   HTN (hypertension), benign 05/18/2016   Hypothyroidism 05/18/2016    PCP: Anne Ng, NP  REFERRING PROVIDER: Ralene Cork, DO  REFERRING DIAG:  Diagnosis  M25.511,G89.29,M25.512 (ICD-10-CM) - Chronic pain of both shoulders    THERAPY DIAG:  Chronic pain of both shoulders  Cramp and spasm  Muscle weakness (generalized)  Rationale for Evaluation and Treatment: Rehabilitation  ONSET DATE: 08/17/22  SUBJECTIVE:  SUBJECTIVE STATEMENT: Doing good, no pain, but slept on it last night causing pain this morning   PERTINENT HISTORY: R THR 2019  PAIN:  Are you having pain? Yes: NPRS scale: 0/10 Pain location: B shoulders, R > L Pain description: sharp, wakes her up, pops during the day, sometimes hurts. Aggravating factors: Lying down Relieving factors: Lying on her back with shoulders and arms supported.  PRECAUTIONS: None  WEIGHT BEARING RESTRICTIONS: No  FALLS:  Has patient fallen in last 6 months? No  LIVING ENVIRONMENT: Lives with: lives with their family Lives in: House/apartment Stairs: No issues Has following equipment at home: None  OCCUPATION: Works from home on computer, has standing desk  PLOF: Independent  PATIENT GOALS:Be able to return to normal daily activities without shoulder pain. Sleep without shoulder pain.  NEXT MD VISIT:   OBJECTIVE:   DIAGNOSTIC FINDINGS:  X rays 08/16/22 1. No acute osseous injury of bilateral shoulders.   PATIENT SURVEYS:  FOTO  56.6  COGNITION: Overall cognitive status: Within functional limits for tasks assessed     SENSATION: WFL  POSTURE: B shoulders hiked.  UPPER EXTREMITY ROM:   Active ROM Right eval Left eval  Shoulder flexion 52 75  Shoulder extension    Shoulder abduction 54 48  Shoulder adduction    Shoulder internal rotation    Shoulder external rotation 12 8  Elbow flexion    Elbow extension    Wrist flexion    Wrist extension    Wrist ulnar deviation    Wrist radial deviation    Wrist pronation    Wrist supination    (Blank rows = not tested)  UPPER EXTREMITY MMT: Shoulders deferred as patient could not tolerate due to pain, the rest of UE 4/5.   SHOULDER SPECIAL TESTS: Impingement tests: Hawkins/Kennedy impingement test: positive  and Painful arc test: positive  Rotator cuff assessment: Drop arm test: positive  and Empty can test: positive  Biceps assessment: Speed's test: positive   JOINT MOBILITY TESTING:  B shoulders with severely limited mobility in all planes, fearful and guarding. B scapular mobility limited due to muscle guarding and pain.  PALPATION:  Very tight and TTP B cervical paraspinals, ant neck, up traps, LS, medial scapular muscles. Supraspinatus tendon and biceps tendon TTP, tight pects   TODAY'S TREATMENT:                                                                                                                                         DATE:  10/24/22 UBE L2 x 3 min each Seated Rows & Lats 15lb 2x10 stopped during the second set of rows, pt reported anterior shoulder pain  Lumbar Ext black band 2x10 Seated Tband rows red 2x10 ER yellow 2x10 PROM to R hip and both Shoulders with end range holds STM to anterior deltoid Bridges 2x10    10/17/22 UBE L2 x 6 minutes  Supine pelvic tilt, progress to bridge, clamshell, bridge with hip abd resistance- G tband, 10 reps each Supine march with pelvic tilt x 10 Side to side step with G tband at knees x  10 Mini squats x 10 reps Upper trunk stretch, thoracic rotation and lower traps Standing shoulder ext 10#, rows, ER, 5#, 2 x 10 each  10/10/22 UBE L1 x30min AAROM Flex, Ext, IR up back 2lb WaTE x10 Shoulder Rows & Ext red 2x10 ER yellow 2x10 Shoulder IR yellow 2x10 each PROM of both shoulders  PROM of R hip.  10/08/22 NuStep with arms and legs, L5 x 6 minutes. Cat cow in quadruped x 10 Childs pose 5 x 10 sec Supine low back stretch-SKTC, DKTC,  pelvic tilt-5 sec hold x 10 reps Supine march with pelvic tilt for trunk stability. R lateral weight shift, sliding hand out to the side and back with lower body weight shift to elongate R side, repeat x 5 in both directions. Massage gun to B UT, cerv paraspinals, patient controlled.-Felt excellent relief. Postural strength and stability exer in stand, shoulder ext, rows, 5#, 2 x 10 each  10/03/22 NuStep with arms and legs-UBE aggravated pain. L3 x 6 minutes Shoulder depression with UT stretch-In sitting, hold side of chair with R arm, lean to L, then tilt head to the L until stretch is felt. Repeat 3 x 15 to each side. Thoracic mobilizations in sitting, lean back over towel roll Thread the needle in standing with rotation back and up to the ceiling, 6 x to each side. Shoulder ext, rows with G tband, 2 x 10. MH and estim for pain relief to neck and UT  09/19/22 UBE in stand, L1 2 min forward and back Standing B shoulder ext with dowel x 10 reps Standing with back to wall, ball behind head to promote chin tuck, shoulder flex with dowel, emphasizing raising distally and keeping shoulders down. Repeat with abd x 5 each direction Seated scapular retraction Attempted baby eagle stretch, but too painful Upper traps stretch MH and IFC estim to neck and shoulders for pain relief.  09/17/22 MH to neck and upper shoulders, 5 minutes Seated lower trunk ant/post weight shifts Seated scapular retraction with shoulders depressed. Seated reach with  rotation behind trunk, sliding hand on mat. Seated reach with rotation across her body with shoulder depression Standing shoulder flex, pushing towel across mat and back x 10 reps Seated chin tucks against ball on wall. Standing B shoulder ext with dowel x 10 reps Standing upper traps stretch, holding wrist behind trunk and gently pulling to R, with R lateral neck flexion. Supine MH and e-stim to neck and upper shoulders, IFC, x 12 minutes to prevent rebound pain after activity.  09/13/22 UBE 1 min forwards and backwards- c/o of sharp pain in R shoulder Wall slides x10 STM to neck and UT Shoulder flexion forward rolling blue pball x10  PROM to both shoulders AAROM supine flexion with dowel x10  Standing abd with dowel x10  Standing IR with dowel x10 Standing shoulder flexion with dowel x10  Standing extension with dowel x10  09/05/22 Education Pedulum exercises and deep breathing to relax arm in pendulum position.   PATIENT EDUCATION: Education details: POC, Person educated: Patient Education method: Medical illustrator Education comprehension: verbalized understanding and returned demonstration  HOME EXERCISE PROGRAM: Pendulum exercises for B shoulders, deep breathing to relax shoulder and facilitate some natural traction. AAROM with dowel flexion, IR, abd, extension  [id number]  ASSESSMENT:  CLINICAL IMPRESSION: Pt enters doing well with no pain despite reporting shoulder pain this morning from sleeping on it. Increase anterior shoulder pain reported with seated machine level rows and with ER. Second set of rows discontinues due to pain. Pain with Shoulder PROM due to pt guarding. Pt has full PROM once she relaxes and allow passive motion. R hip tight noted with PROM  OBJECTIVE IMPAIRMENTS: decreased activity tolerance, decreased endurance, decreased mobility, decreased ROM, decreased strength, hypomobility, increased fascial restrictions, increased muscle spasms,  impaired flexibility, impaired UE functional use, improper body mechanics, postural dysfunction, and pain.   ACTIVITY LIMITATIONS: carrying, lifting, bending, squatting, sleeping, bed mobility, dressing, reach over head, and hygiene/grooming  PARTICIPATION LIMITATIONS: cleaning, laundry, shopping, and community activity  PERSONAL FACTORS: Past/current experiences are also affecting patient's functional outcome.   REHAB POTENTIAL: Good  CLINICAL DECISION MAKING: Evolving/moderate complexity  EVALUATION COMPLEXITY: High   GOALS: Goals reviewed with patient? Yes  SHORT TERM GOALS: Target date: 09/20/22  I with initial HEP Baseline: Goal status: 10/03/22 met  LONG TERM GOALS: Target date: 11/28/22  I with final HEP Baseline:  Goal status: INITIAL  2.  Increase FOTO to at least 67 Baseline: 56.6 Goal status: INITIAL  3.  Patient will achieve full AROM in B shoulders with pain < 3/10 Baseline: Max elevation of 75 degrees with severe pain. Goal status: 09/19/22-ongoing  4.  Patient will demosntrate B shoulder strength of at least 4/5, with pain < 3/10 Baseline: Unable to tolerate any resistance or move against gravity due to pain. Goal status: 10/17/22, met  5.  Patient will be able to sleep x at least 6 hours without waking due to pain in shoulders Baseline: 1-2 hours Goal status: INITIAL  6.  Patient will be able to perform all self care and daily activities with B shoulder < 3/10, no compensations needed. Baseline:  Goal status: INITIAL  PLAN:  PT FREQUENCY: 1-2x/week  PT DURATION: 12 weeks  PLANNED INTERVENTIONS: Therapeutic exercises, Therapeutic activity, Neuromuscular re-education, Balance training, Gait training, Patient/Family education, Self Care, Joint mobilization, Dry Needling, Electrical stimulation, Spinal mobilization, Cryotherapy, Moist heat, Taping, Vasopneumatic device, Ultrasound, Ionotophoresis 4mg /ml Dexamethasone, and Manual therapy  PLAN FOR NEXT  SESSION: Try some gentle STM again to see if she tolerates it better.  Debroah Baller, PTA 10/24/22 4:01 PM

## 2022-10-25 ENCOUNTER — Other Ambulatory Visit (HOSPITAL_COMMUNITY): Payer: Self-pay

## 2022-10-26 ENCOUNTER — Other Ambulatory Visit (HOSPITAL_COMMUNITY): Payer: Self-pay

## 2022-10-26 ENCOUNTER — Other Ambulatory Visit: Payer: Self-pay

## 2022-10-26 MED ORDER — LATANOPROST 0.005 % OP SOLN
1.0000 [drp] | Freq: Every evening | OPHTHALMIC | 6 refills | Status: DC
  Filled 2022-10-26: qty 5, 50d supply, fill #0
  Filled 2023-01-02: qty 5, 50d supply, fill #1
  Filled 2023-03-11: qty 5, 50d supply, fill #2
  Filled 2023-05-05: qty 5, 50d supply, fill #3
  Filled 2023-07-06: qty 5, 50d supply, fill #4

## 2022-10-29 ENCOUNTER — Encounter: Payer: Self-pay | Admitting: Physical Therapy

## 2022-10-29 ENCOUNTER — Ambulatory Visit: Payer: 59 | Admitting: Physical Therapy

## 2022-10-29 DIAGNOSIS — G8929 Other chronic pain: Secondary | ICD-10-CM

## 2022-10-29 DIAGNOSIS — M6281 Muscle weakness (generalized): Secondary | ICD-10-CM

## 2022-10-29 DIAGNOSIS — R252 Cramp and spasm: Secondary | ICD-10-CM

## 2022-10-29 DIAGNOSIS — M25511 Pain in right shoulder: Secondary | ICD-10-CM | POA: Diagnosis not present

## 2022-10-29 NOTE — Therapy (Signed)
OUTPATIENT PHYSICAL THERAPY SHOULDER TREATMENT   Patient Name: Shelly Cohen MRN: [id number] DOB:September 15, 1960, 62 y.o., female Today's Date: 10/29/2022  END OF SESSION:  PT End of Session - 10/29/22 1601     Visit Number [id number]    PT Start Time 1600    PT Stop Time 1645    PT Time Calculation (min) 45 min    Activity Tolerance Patient tolerated treatment well    Behavior During Therapy WFL for tasks assessed/performed                 Past Medical History:  Diagnosis Date   Alpha thalassemia trait    Arthritis    hips   Fibroadenoma    Glaucoma    monitored every 6months, right eye worse than left    History of positive PPD, treatment status unknown    Hypertension    OSA (obstructive sleep apnea)    mild with AHI 8/hr   Pre-diabetes    Thyroid disease    Past Surgical History:  Procedure Laterality Date   ABDOMINAL HYSTERECTOMY  2013   BREAST BIOPSY  2013   BUNIONECTOMY  10/2014   CHOLECYSTECTOMY  2015   COLONOSCOPY     hallux limi Right 2019   great  toe revision   TOTAL HIP ARTHROPLASTY Right 09/12/2018   Procedure: RIGHT TOTAL HIP ARTHROPLASTY ANTERIOR APPROACH;  Surgeon: Kathryne Hitch, MD;  Location: [facility];  Service: Orthopedics;  Laterality: Right;   TUBAL LIGATION  1990   UPPER GI ENDOSCOPY N/A 02/20/2021   Procedure: UPPER GI ENDOSCOPY;  Surgeon: Gaynelle Adu, MD;  Location: [facility];  Service: General;  Laterality: N/A;   Patient Active Problem List   Diagnosis Date Noted   Myalgia 04/05/2022   White coat syndrome with diagnosis of hypertension 03/22/2022   Chronic hip pain after total replacement of right hip joint 08/14/2021   Numbness and tingling of foot 04/04/2021   S/P laparoscopic sleeve gastrectomy 02/20/2021   OSA (obstructive sleep apnea) 02/09/2020   DJD (degenerative joint disease) 02/09/2020   DM (diabetes mellitus) (HCC) 08/13/2019   Primary insomnia 08/13/2019   Status post total replacement of right hip 09/12/2018    Obesity (BMI 30-39.9) 08/22/2018   Trigger thumb, left thumb 05/07/2018   OA (osteoarthritis) of hip 04/22/2018   Open angle primary glaucoma 01/08/2018   Thalassemia trait, alpha 06/29/2016   Iron deficiency anemia 06/29/2016   S/P bunionectomy 06/29/2016   HTN (hypertension), benign 05/18/2016   Hypothyroidism 05/18/2016    PCP: Anne Ng, NP  REFERRING PROVIDER: Ralene Cork, DO  REFERRING DIAG:  Diagnosis  M25.511,G89.29,M25.512 (ICD-10-CM) - Chronic pain of both shoulders    THERAPY DIAG:  Chronic pain of both shoulders  Cramp and spasm  Muscle weakness (generalized)  Rationale for Evaluation and Treatment: Rehabilitation  ONSET DATE: 08/17/22  SUBJECTIVE:  SUBJECTIVE STATEMENT: "Doing good"   PERTINENT HISTORY: R THR 2019  PAIN:  Are you having pain? Yes: NPRS scale: 0/10 Pain location: B shoulders, R > L Pain description: sharp, wakes her up, pops during the day, sometimes hurts. Aggravating factors: Lying down Relieving factors: Lying on her back with shoulders and arms supported.  PRECAUTIONS: None  WEIGHT BEARING RESTRICTIONS: No  FALLS:  Has patient fallen in last 6 months? No  LIVING ENVIRONMENT: Lives with: lives with their family Lives in: House/apartment Stairs: No issues Has following equipment at home: None  OCCUPATION: Works from home on computer, has standing desk  PLOF: Independent  PATIENT GOALS:Be able to return to normal daily activities without shoulder pain. Sleep without shoulder pain.  NEXT MD VISIT:   OBJECTIVE:   DIAGNOSTIC FINDINGS:  X rays 08/16/22 1. No acute osseous injury of bilateral shoulders.   PATIENT SURVEYS:  FOTO 56.6  COGNITION: Overall cognitive status: Within functional limits for tasks  assessed     SENSATION: WFL  POSTURE: B shoulders hiked.  UPPER EXTREMITY ROM:   Active ROM Right eval Left eval Left  10/29/22 Left  10/29/22  Shoulder flexion 52 75 162 160  Shoulder extension      Shoulder abduction 54 48 169 140  Shoulder adduction      Shoulder internal rotation      Shoulder external rotation 12 8 90 90  Elbow flexion      Elbow extension      Wrist flexion      Wrist extension      Wrist ulnar deviation      Wrist radial deviation      Wrist pronation      Wrist supination      (Blank rows = not tested)  UPPER EXTREMITY MMT: Shoulders deferred as patient could not tolerate due to pain, the rest of UE 4/5.   SHOULDER SPECIAL TESTS: Impingement tests: Hawkins/Kennedy impingement test: positive  and Painful arc test: positive  Rotator cuff assessment: Drop arm test: positive  and Empty can test: positive  Biceps assessment: Speed's test: positive   JOINT MOBILITY TESTING:  B shoulders with severely limited mobility in all planes, fearful and guarding. B scapular mobility limited due to muscle guarding and pain.  PALPATION:  Very tight and TTP B cervical paraspinals, ant neck, up traps, LS, medial scapular muscles. Supraspinatus tendon and biceps tendon TTP, tight pects   TODAY'S TREATMENT:                                                                                                                                         DATE:  10/29/22 NuStep L5 x 6 min Rows and Ext  Green 2x10  Shoulder ER yellow 2x10 Shoulder Flex & Abd 2lb 2x10 Horiz abd yellow 2x10 Triceps Ext 25lb 2x10 Biceps Curls 15lb 2x10 LE on Pball bridges, Oblq, K2C  10/24/22 UBE L2 x 3 min each Seated Rows & Lats 15lb 2x10 stopped during the second set of rows, pt reported anterior shoulder pain  Lumbar Ext black band 2x10 Seated Tband rows red 2x10 ER yellow 2x10 PROM to R hip and both Shoulders with end range holds STM to anterior deltoid Bridges 2x10     10/17/22 UBE L2 x 6 minutes Supine pelvic tilt, progress to bridge, clamshell, bridge with hip abd resistance- G tband, 10 reps each Supine march with pelvic tilt x 10 Side to side step with G tband at knees x 10 Mini squats x 10 reps Upper trunk stretch, thoracic rotation and lower traps Standing shoulder ext 10#, rows, ER, 5#, 2 x 10 each  10/10/22 UBE L1 x5min AAROM Flex, Ext, IR up back 2lb WaTE x10 Shoulder Rows & Ext red 2x10 ER yellow 2x10 Shoulder IR yellow 2x10 each PROM of both shoulders  PROM of R hip.  10/08/22 NuStep with arms and legs, L5 x 6 minutes. Cat cow in quadruped x 10 Childs pose 5 x 10 sec Supine low back stretch-SKTC, DKTC,  pelvic tilt-5 sec hold x 10 reps Supine march with pelvic tilt for trunk stability. R lateral weight shift, sliding hand out to the side and back with lower body weight shift to elongate R side, repeat x 5 in both directions. Massage gun to B UT, cerv paraspinals, patient controlled.-Felt excellent relief. Postural strength and stability exer in stand, shoulder ext, rows, 5#, 2 x 10 each  10/03/22 NuStep with arms and legs-UBE aggravated pain. L3 x 6 minutes Shoulder depression with UT stretch-In sitting, hold side of chair with R arm, lean to L, then tilt head to the L until stretch is felt. Repeat 3 x 15 to each side. Thoracic mobilizations in sitting, lean back over towel roll Thread the needle in standing with rotation back and up to the ceiling, 6 x to each side. Shoulder ext, rows with G tband, 2 x 10. MH and estim for pain relief to neck and UT   PATIENT EDUCATION: Education details: POC, Person educated: Patient Education method: Medical illustrator Education comprehension: verbalized understanding and returned demonstration  HOME EXERCISE PROGRAM: Pendulum exercises for B shoulders, deep breathing to relax shoulder and facilitate some natural traction. AAROM with dowel flexion, IR, abd, extension   [id number]  ASSESSMENT:  CLINICAL IMPRESSION: Pt enters doing well with no pain. She has progressed increasing her bilateral shoulder AROM in all directions. Pt has met some LTG',.  Some lateral shoulder burning/pain reported with Shoulder ER. Pt did well with all other interventions.  OBJECTIVE IMPAIRMENTS: decreased activity tolerance, decreased endurance, decreased mobility, decreased ROM, decreased strength, hypomobility, increased fascial restrictions, increased muscle spasms, impaired flexibility, impaired UE functional use, improper body mechanics, postural dysfunction, and pain.   ACTIVITY LIMITATIONS: carrying, lifting, bending, squatting, sleeping, bed mobility, dressing, reach over head, and hygiene/grooming  PARTICIPATION LIMITATIONS: cleaning, laundry, shopping, and community activity  PERSONAL FACTORS: Past/current experiences are also affecting patient's functional outcome.   REHAB POTENTIAL: Good  CLINICAL DECISION MAKING: Evolving/moderate complexity  EVALUATION COMPLEXITY: High   GOALS: Goals reviewed with patient? Yes  SHORT TERM GOALS: Target date: 09/20/22  I with initial HEP Baseline: Goal status: 10/03/22 met  LONG TERM GOALS: Target date: 11/28/22  I with final HEP Baseline:  Goal status: INITIAL  2.  Increase FOTO to at least 67 Baseline: 56.6 Goal status: Progressing 66 10/29/22  3.  Patient will achieve full AROM in B  shoulders with pain < 3/10 Baseline: Max elevation of 75 degrees with severe pain. Goal status: 09/19/22-ongoingPartly Met 10/29/22 4.  Patient will demosntrate B shoulder strength of at least 4/5, with pain < 3/10 Baseline: Unable to tolerate any resistance or move against gravity due to pain. Goal status: 10/17/22, met  5.  Patient will be able to sleep x at least 6 hours without waking due to pain in shoulders Baseline: 1-2 hours Goal status: Met 10/29/22  6.  Patient will be able to perform all self care and daily activities with B  shoulder < 3/10, no compensations needed. Baseline:  Goal status: Met 10/29/22  PLAN:  PT FREQUENCY: 1-2x/week  PT DURATION: 12 weeks  PLANNED INTERVENTIONS: Therapeutic exercises, Therapeutic activity, Neuromuscular re-education, Balance training, Gait training, Patient/Family education, Self Care, Joint mobilization, Dry Needling, Electrical stimulation, Spinal mobilization, Cryotherapy, Moist heat, Taping, Vasopneumatic device, Ultrasound, Ionotophoresis 4mg /ml Dexamethasone, and Manual therapy  PLAN FOR NEXT SESSION: Try some gentle STM again to see if she tolerates it better.  Debroah Baller, PTA 10/29/22 4:04 PM

## 2022-10-31 ENCOUNTER — Other Ambulatory Visit: Payer: Self-pay

## 2022-10-31 ENCOUNTER — Ambulatory Visit: Payer: 59 | Attending: Sports Medicine | Admitting: Physical Therapy

## 2022-10-31 ENCOUNTER — Encounter: Payer: Self-pay | Admitting: Physical Therapy

## 2022-10-31 DIAGNOSIS — R252 Cramp and spasm: Secondary | ICD-10-CM | POA: Diagnosis not present

## 2022-10-31 DIAGNOSIS — M6281 Muscle weakness (generalized): Secondary | ICD-10-CM | POA: Insufficient documentation

## 2022-10-31 DIAGNOSIS — M25511 Pain in right shoulder: Secondary | ICD-10-CM | POA: Insufficient documentation

## 2022-10-31 DIAGNOSIS — M25512 Pain in left shoulder: Secondary | ICD-10-CM | POA: Insufficient documentation

## 2022-10-31 DIAGNOSIS — G8929 Other chronic pain: Secondary | ICD-10-CM | POA: Diagnosis not present

## 2022-10-31 NOTE — Therapy (Signed)
OUTPATIENT PHYSICAL THERAPY SHOULDER TREATMENT   Patient Name: Shelly Cohen MRN: [id number] DOB:March 17, 1961, 62 y.o., female Today's Date: 10/31/2022  END OF SESSION:  PT End of Session - 10/31/22 1634     Visit Number 11    Date for PT Re-Evaluation 11/28/22    PT Start Time 1630    PT Stop Time 1710    PT Time Calculation (min) 40 min    Activity Tolerance Patient tolerated treatment well    Behavior During Therapy WFL for tasks assessed/performed                 Past Medical History:  Diagnosis Date   Alpha thalassemia trait    Arthritis    hips   Fibroadenoma    Glaucoma    monitored every 6months, right eye worse than left    History of positive PPD, treatment status unknown    Hypertension    OSA (obstructive sleep apnea)    mild with AHI 8/hr   Pre-diabetes    Thyroid disease    Past Surgical History:  Procedure Laterality Date   ABDOMINAL HYSTERECTOMY  2013   BREAST BIOPSY  2013   BUNIONECTOMY  10/2014   CHOLECYSTECTOMY  2015   COLONOSCOPY     hallux limi Right 2019   great  toe revision   TOTAL HIP ARTHROPLASTY Right 09/12/2018   Procedure: RIGHT TOTAL HIP ARTHROPLASTY ANTERIOR APPROACH;  Surgeon: Kathryne Hitch, MD;  Location: [facility];  Service: Orthopedics;  Laterality: Right;   TUBAL LIGATION  1990   UPPER GI ENDOSCOPY N/A 02/20/2021   Procedure: UPPER GI ENDOSCOPY;  Surgeon: Gaynelle Adu, MD;  Location: [facility];  Service: General;  Laterality: N/A;   Patient Active Problem List   Diagnosis Date Noted   Myalgia 04/05/2022   White coat syndrome with diagnosis of hypertension 03/22/2022   Chronic hip pain after total replacement of right hip joint 08/14/2021   Numbness and tingling of foot 04/04/2021   S/P laparoscopic sleeve gastrectomy 02/20/2021   OSA (obstructive sleep apnea) 02/09/2020   DJD (degenerative joint disease) 02/09/2020   DM (diabetes mellitus) (HCC) 08/13/2019   Primary insomnia 08/13/2019   Status post total  replacement of right hip 09/12/2018   Obesity (BMI 30-39.9) 08/22/2018   Trigger thumb, left thumb 05/07/2018   OA (osteoarthritis) of hip 04/22/2018   Open angle primary glaucoma 01/08/2018   Thalassemia trait, alpha 06/29/2016   Iron deficiency anemia 06/29/2016   S/P bunionectomy 06/29/2016   HTN (hypertension), benign 05/18/2016   Hypothyroidism 05/18/2016    PCP: Anne Ng, NP  REFERRING PROVIDER: Ralene Cork, DO  REFERRING DIAG:  Diagnosis  M25.511,G89.29,M25.512 (ICD-10-CM) - Chronic pain of both shoulders    THERAPY DIAG:  Chronic pain of both shoulders  Cramp and spasm  Muscle weakness (generalized)  Rationale for Evaluation and Treatment: Rehabilitation  ONSET DATE: 08/17/22  SUBJECTIVE:  SUBJECTIVE STATEMENT: "Doing good"   PERTINENT HISTORY: R THR 2019  PAIN:  Are you having pain? Yes: NPRS scale: 0/10 Pain location: B shoulders, R > L Pain description: sharp, wakes her up, pops during the day, sometimes hurts. Aggravating factors: Lying down Relieving factors: Lying on her back with shoulders and arms supported.  PRECAUTIONS: None  WEIGHT BEARING RESTRICTIONS: No  FALLS:  Has patient fallen in last 6 months? No  LIVING ENVIRONMENT: Lives with: lives with their family Lives in: House/apartment Stairs: No issues Has following equipment at home: None  OCCUPATION: Works from home on computer, has standing desk  PLOF: Independent  PATIENT GOALS:Be able to return to normal daily activities without shoulder pain. Sleep without shoulder pain.  NEXT MD VISIT:   OBJECTIVE:   DIAGNOSTIC FINDINGS:  X rays 08/16/22 1. No acute osseous injury of bilateral shoulders.   PATIENT SURVEYS:  FOTO 56.6  COGNITION: Overall cognitive status: Within  functional limits for tasks assessed     SENSATION: WFL  POSTURE: B shoulders hiked.  UPPER EXTREMITY ROM:   Active ROM Right eval Left eval Left  10/29/22 Left  10/29/22  Shoulder flexion 52 75 162 160  Shoulder extension      Shoulder abduction 54 48 169 140  Shoulder adduction      Shoulder internal rotation      Shoulder external rotation 12 8 90 90  Elbow flexion      Elbow extension      Wrist flexion      Wrist extension      Wrist ulnar deviation      Wrist radial deviation      Wrist pronation      Wrist supination      (Blank rows = not tested)  UPPER EXTREMITY MMT: Shoulders deferred as patient could not tolerate due to pain, the rest of UE 4/5.   SHOULDER SPECIAL TESTS: Impingement tests: Hawkins/Kennedy impingement test: positive  and Painful arc test: positive  Rotator cuff assessment: Drop arm test: positive  and Empty can test: positive  Biceps assessment: Speed's test: positive   JOINT MOBILITY TESTING:  B shoulders with severely limited mobility in all planes, fearful and guarding. B scapular mobility limited due to muscle guarding and pain.  PALPATION:  Very tight and TTP B cervical paraspinals, ant neck, up traps, LS, medial scapular muscles. Supraspinatus tendon and biceps tendon TTP, tight pects   TODAY'S TREATMENT:                                                                                                                                         DATE:  10/31/22 NuStep L5 x 6 min Stretching for neck and shoulders Low cerv and upper Thoracic stretch, UT stretch, post capsule stretch, rhomboid stretch Chest press 10#, 2 x 10 reps Lat pulls, 20#, 2 x 10 reps Rows, 20#, 15#, 1 x  10 reps each Seated knee flexion, 25#, 2 x 10, BLE. Seated knee ext, 5#, BLE, 2 x 10 Leg press, 30#, 2 x 10 reps. Standing hip abd against G tband, 2 x 10 each  10/29/22 NuStep L5 x 6 min Rows and Ext  Green 2x10  Shoulder ER yellow 2x10 Shoulder Flex & Abd 2lb  2x10 Horiz abd yellow 2x10 Triceps Ext 25lb 2x10 Biceps Curls 15lb 2x10 LE on Pball bridges, Oblq, K2C   10/24/22 UBE L2 x 3 min each Seated Rows & Lats 15lb 2x10 stopped during the second set of rows, pt reported anterior shoulder pain  Lumbar Ext black band 2x10 Seated Tband rows red 2x10 ER yellow 2x10 PROM to R hip and both Shoulders with end range holds STM to anterior deltoid Bridges 2x10   10/17/22 UBE L2 x 6 minutes Supine pelvic tilt, progress to bridge, clamshell, bridge with hip abd resistance- G tband, 10 reps each Supine march with pelvic tilt x 10 Side to side step with G tband at knees x 10 Mini squats x 10 reps Upper trunk stretch, thoracic rotation and lower traps Standing shoulder ext 10#, rows, ER, 5#, 2 x 10 each  10/10/22 UBE L1 x45min AAROM Flex, Ext, IR up back 2lb WaTE x10 Shoulder Rows & Ext red 2x10 ER yellow 2x10 Shoulder IR yellow 2x10 each PROM of both shoulders  PROM of R hip.  10/08/22 NuStep with arms and legs, L5 x 6 minutes. Cat cow in quadruped x 10 Childs pose 5 x 10 sec Supine low back stretch-SKTC, DKTC,  pelvic tilt-5 sec hold x 10 reps Supine march with pelvic tilt for trunk stability. R lateral weight shift, sliding hand out to the side and back with lower body weight shift to elongate R side, repeat x 5 in both directions. Massage gun to B UT, cerv paraspinals, patient controlled.-Felt excellent relief. Postural strength and stability exer in stand, shoulder ext, rows, 5#, 2 x 10 each  10/03/22 NuStep with arms and legs-UBE aggravated pain. L3 x 6 minutes Shoulder depression with UT stretch-In sitting, hold side of chair with R arm, lean to L, then tilt head to the L until stretch is felt. Repeat 3 x 15 to each side. Thoracic mobilizations in sitting, lean back over towel roll Thread the needle in standing with rotation back and up to the ceiling, 6 x to each side. Shoulder ext, rows with G tband, 2 x 10. MH and estim for pain  relief to neck and UT   PATIENT EDUCATION: Education details: POC, Person educated: Patient Education method: Medical illustrator Education comprehension: verbalized understanding and returned demonstration  HOME EXERCISE PROGRAM: Pendulum exercises for B shoulders, deep breathing to relax shoulder and facilitate some natural traction. AAROM with dowel flexion, IR, abd, extension  [id number]  ASSESSMENT:  CLINICAL IMPRESSION: Introduced some weight training today as patient's ultimate goal is to return to the gym. She tolerated multiple upper and lower body exercises with resistance. No report of shoulder pain.  OBJECTIVE IMPAIRMENTS: decreased activity tolerance, decreased endurance, decreased mobility, decreased ROM, decreased strength, hypomobility, increased fascial restrictions, increased muscle spasms, impaired flexibility, impaired UE functional use, improper body mechanics, postural dysfunction, and pain.   ACTIVITY LIMITATIONS: carrying, lifting, bending, squatting, sleeping, bed mobility, dressing, reach over head, and hygiene/grooming  PARTICIPATION LIMITATIONS: cleaning, laundry, shopping, and community activity  PERSONAL FACTORS: Past/current experiences are also affecting patient's functional outcome.   REHAB POTENTIAL: Good  CLINICAL DECISION MAKING: Evolving/moderate complexity  EVALUATION  COMPLEXITY: High   GOALS: Goals reviewed with patient? Yes  SHORT TERM GOALS: Target date: 09/20/22  I with initial HEP Baseline: Goal status: 10/03/22 met  LONG TERM GOALS: Target date: 11/28/22  I with final HEP Baseline:  Goal status: INITIAL  2.  Increase FOTO to at least 67 Baseline: 56.6 Goal status: Progressing 66 10/29/22  3.  Patient will achieve full AROM in B shoulders with pain < 3/10 Baseline: Max elevation of 75 degrees with severe pain. Goal status: 09/19/22-ongoingPartly Met 10/29/22 4.  Patient will demosntrate B shoulder strength of at least  4/5, with pain < 3/10 Baseline: Unable to tolerate any resistance or move against gravity due to pain. Goal status: 10/17/22, met  5.  Patient will be able to sleep x at least 6 hours without waking due to pain in shoulders Baseline: 1-2 hours Goal status: Met 10/29/22  6.  Patient will be able to perform all self care and daily activities with B shoulder < 3/10, no compensations needed. Baseline:  Goal status: Met 10/29/22  PLAN:  PT FREQUENCY: 1-2x/week  PT DURATION: 12 weeks  PLANNED INTERVENTIONS: Therapeutic exercises, Therapeutic activity, Neuromuscular re-education, Balance training, Gait training, Patient/Family education, Self Care, Joint mobilization, Dry Needling, Electrical stimulation, Spinal mobilization, Cryotherapy, Moist heat, Taping, Vasopneumatic device, Ultrasound, Ionotophoresis 4mg /ml Dexamethasone, and Manual therapy  PLAN FOR NEXT SESSION: How did she tolerate the weight training?  Oley Balm DPT 10/31/22 5:12 PM

## 2022-11-01 ENCOUNTER — Other Ambulatory Visit: Payer: Self-pay

## 2022-11-05 ENCOUNTER — Ambulatory Visit: Payer: 59 | Admitting: Physical Therapy

## 2022-11-05 ENCOUNTER — Encounter: Payer: Self-pay | Admitting: Physical Therapy

## 2022-11-05 DIAGNOSIS — M25512 Pain in left shoulder: Secondary | ICD-10-CM | POA: Diagnosis not present

## 2022-11-05 DIAGNOSIS — M6281 Muscle weakness (generalized): Secondary | ICD-10-CM | POA: Diagnosis not present

## 2022-11-05 DIAGNOSIS — R252 Cramp and spasm: Secondary | ICD-10-CM | POA: Diagnosis not present

## 2022-11-05 DIAGNOSIS — G8929 Other chronic pain: Secondary | ICD-10-CM | POA: Diagnosis not present

## 2022-11-05 DIAGNOSIS — M25511 Pain in right shoulder: Secondary | ICD-10-CM | POA: Diagnosis not present
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
## 2022-11-06 ENCOUNTER — Other Ambulatory Visit: Payer: Self-pay

## 2022-11-06 ENCOUNTER — Other Ambulatory Visit (HOSPITAL_COMMUNITY): Payer: Self-pay

## 2022-11-07 ENCOUNTER — Encounter: Payer: Self-pay | Admitting: Physical Therapy

## 2022-11-07 ENCOUNTER — Ambulatory Visit: Payer: 59 | Admitting: Physical Therapy

## 2022-11-07 DIAGNOSIS — R252 Cramp and spasm: Secondary | ICD-10-CM

## 2022-11-07 DIAGNOSIS — G8929 Other chronic pain: Secondary | ICD-10-CM

## 2022-11-07 DIAGNOSIS — M25512 Pain in left shoulder: Secondary | ICD-10-CM | POA: Diagnosis not present

## 2022-11-07 DIAGNOSIS — M6281 Muscle weakness (generalized): Secondary | ICD-10-CM | POA: Diagnosis not present

## 2022-11-07 DIAGNOSIS — M25511 Pain in right shoulder: Secondary | ICD-10-CM | POA: Diagnosis not present

## 2022-11-07 NOTE — Therapy (Signed)
OUTPATIENT PHYSICAL THERAPY SHOULDER TREATMENT   Patient Name: Shelly Cohen MRN: [id number] DOB:[birth year], 62 y.o., female Today's Date: 11/07/2022  END OF SESSION:  PT End of Session - 11/07/22 1635     Visit Number 13    Date for PT Re-Evaluation 11/28/22    PT Start Time 1629    PT Stop Time 1710    PT Time Calculation (min) 41 min    Activity Tolerance Patient tolerated treatment well    Behavior During Therapy WFL for tasks assessed/performed            Past Medical History:  Diagnosis Date   Alpha thalassemia trait    Arthritis    hips   Fibroadenoma    Glaucoma    monitored every 6months, right eye worse than left    History of positive PPD, treatment status unknown    Hypertension    OSA (obstructive sleep apnea)    mild with AHI 8/hr   Pre-diabetes    Thyroid disease    Past Surgical History:  Procedure Laterality Date   ABDOMINAL HYSTERECTOMY  2013   BREAST BIOPSY  2013   BUNIONECTOMY  10/2014   CHOLECYSTECTOMY  2015   COLONOSCOPY     hallux limi Right 2019   great  toe revision   TOTAL HIP ARTHROPLASTY Right 09/12/2018   Procedure: RIGHT TOTAL HIP ARTHROPLASTY ANTERIOR APPROACH;  Surgeon: Kathryne Hitch, MD;  Location: [facility];  Service: Orthopedics;  Laterality: Right;   TUBAL LIGATION  1990   UPPER GI ENDOSCOPY N/A 02/20/2021   Procedure: UPPER GI ENDOSCOPY;  Surgeon: Gaynelle Adu, MD;  Location: [facility];  Service: General;  Laterality: N/A;   Patient Active Problem List   Diagnosis Date Noted   Myalgia 04/05/2022   White coat syndrome with diagnosis of hypertension 03/22/2022   Chronic hip pain after total replacement of right hip joint 08/14/2021   Numbness and tingling of foot 04/04/2021   S/P laparoscopic sleeve gastrectomy 02/20/2021   OSA (obstructive sleep apnea) 02/09/2020   DJD (degenerative joint disease) 02/09/2020   DM (diabetes mellitus) (HCC) 08/13/2019   Primary insomnia 08/13/2019   Status post total replacement  of right hip 09/12/2018   Obesity (BMI 30-39.9) 08/22/2018   Trigger thumb, left thumb 05/07/2018   OA (osteoarthritis) of hip 04/22/2018   Open angle primary glaucoma 01/08/2018   Thalassemia trait, alpha 06/29/2016   Iron deficiency anemia 06/29/2016   S/P bunionectomy 06/29/2016   HTN (hypertension), benign 05/18/2016   Hypothyroidism 05/18/2016    PCP: Anne Ng, NP  REFERRING PROVIDER: Ralene Cork, DO  REFERRING DIAG:  Diagnosis  M25.511,G89.29,M25.512 (ICD-10-CM) - Chronic pain of both shoulders    THERAPY DIAG:  Chronic pain of both shoulders  Cramp and spasm  Muscle weakness (generalized)  Rationale for Evaluation and Treatment: Rehabilitation  ONSET DATE: 08/17/22  SUBJECTIVE:  SUBJECTIVE STATEMENT: Patient reports that her Thoracic pain is gone, did not return after last treatment.   PERTINENT HISTORY: R THR 2019  PAIN:  Are you having pain? Yes: NPRS scale: 0/10 Pain location: B shoulders, R > L Pain description: sharp, wakes her up, pops during the day, sometimes hurts. Aggravating factors: Lying down Relieving factors: Lying on her back with shoulders and arms supported.  PRECAUTIONS: None  WEIGHT BEARING RESTRICTIONS: No  FALLS:  Has patient fallen in last 6 months? No  LIVING ENVIRONMENT: Lives with: lives with their family Lives in: House/apartment Stairs: No issues Has following equipment at home: None  OCCUPATION: Works from home on computer, has standing desk  PLOF: Independent  PATIENT GOALS:Be able to return to normal daily activities without shoulder pain. Sleep without shoulder pain.  NEXT MD VISIT:   OBJECTIVE:   DIAGNOSTIC FINDINGS:  X rays 08/16/22 1. No acute osseous injury of bilateral shoulders.   PATIENT SURVEYS:  FOTO  56.6  COGNITION: Overall cognitive status: Within functional limits for tasks assessed     SENSATION: WFL  POSTURE: B shoulders hiked.  UPPER EXTREMITY ROM:   Active ROM Right eval Left eval Left  10/29/22 Left  10/29/22  Shoulder flexion 52 75 162 160  Shoulder extension      Shoulder abduction 54 48 169 140  Shoulder adduction      Shoulder internal rotation      Shoulder external rotation 12 8 90 90  Elbow flexion      Elbow extension      Wrist flexion      Wrist extension      Wrist ulnar deviation      Wrist radial deviation      Wrist pronation      Wrist supination      (Blank rows = not tested)  UPPER EXTREMITY MMT: Shoulders deferred as patient could not tolerate due to pain, the rest of UE 4/5.   SHOULDER SPECIAL TESTS: Impingement tests: Hawkins/Kennedy impingement test: positive  and Painful arc test: positive  Rotator cuff assessment: Drop arm test: positive  and Empty can test: positive  Biceps assessment: Speed's test: positive   JOINT MOBILITY TESTING:  B shoulders with severely limited mobility in all planes, fearful and guarding. B scapular mobility limited due to muscle guarding and pain.  PALPATION:  Very tight and TTP B cervical paraspinals, ant neck, up traps, LS, medial scapular muscles. Supraspinatus tendon and biceps tendon TTP, tight pects   TODAY'S TREATMENT:                                                                                                                                         DATE:  11/07/22 NuStep L5 x 6 minutes 3 way shoulder elevation with 2# weights x 10 reps Overhead ball on wall taps, 4#, ball, 2 x 10 facing and 1 x 10  back to wall Ball on wall press, diamond pattern, 2 x 5 in each direction, 4# ball. Weighted carry, 20#, 150' each arm Standing shoulder ext with 15#, 2 x 10 Rows, 10#, 1 x 10 ER, 5#, 1 x 10 each Palof press, 10#, 2 x 10 reps each side B shoulder IR/ER stretch with strap, 2 x 10 sec Shoulder ext  with strap behind back, 2 x 10 sec Lower traps stretch 2 x 10 sec  11/05/22 UBE L1 x 3 min forward and back Thoracic assessment due to pain-Very TTP over T6-7 spinous process, mildly TTP on either side. Gentle T spine mobilizations in prone with extension. Seated T spine mobilizations into ext Seated chest press, 25#, 2 x 10 reps Seated abdominal crunch with Black tband, 2 x 10 Lumbar extension against Black tband resistance, 2 x 10 reps Seated lat pulls, 25#, 2 x 10 reps Seated rows, 25#, 2 x 10 reps Palof press, 10#,  2 x 10 in each direction Forward flex over physioball, 5 x 10 sec  10/31/22 NuStep L5 x 6 min Stretching for neck and shoulders Low cerv and upper Thoracic stretch, UT stretch, post capsule stretch, rhomboid stretch Chest press 10#, 2 x 10 reps Lat pulls, 20#, 2 x 10 reps Rows, 20#, 15#, 1 x 10 reps each Seated knee flexion, 25#, 2 x 10, BLE. Seated knee ext, 5#, BLE, 2 x 10 Leg press, 30#, 2 x 10 reps. Standing hip abd against G tband, 2 x 10 each  10/29/22 NuStep L5 x 6 min Rows and Ext  Green 2x10  Shoulder ER yellow 2x10 Shoulder Flex & Abd 2lb 2x10 Horiz abd yellow 2x10 Triceps Ext 25lb 2x10 Biceps Curls 15lb 2x10 LE on Pball bridges, Oblq, K2C    PATIENT EDUCATION: Education details: POC, Person educated: Patient Education method: Medical illustrator Education comprehension: verbalized understanding and returned demonstration  HOME EXERCISE PROGRAM: Pendulum exercises for B shoulders, deep breathing to relax shoulder and facilitate some natural traction. AAROM with dowel flexion, IR, abd, extension  [id number]  ASSESSMENT:  CLINICAL IMPRESSION: Patient reports that Thoracic pain has resolved. Progressed her with increased resistance/strengthening for upper body. She tolerated progression with no pain. Finished with stretch to B shoulders.  OBJECTIVE IMPAIRMENTS: decreased activity tolerance, decreased endurance, decreased mobility,  decreased ROM, decreased strength, hypomobility, increased fascial restrictions, increased muscle spasms, impaired flexibility, impaired UE functional use, improper body mechanics, postural dysfunction, and pain.   ACTIVITY LIMITATIONS: carrying, lifting, bending, squatting, sleeping, bed mobility, dressing, reach over head, and hygiene/grooming  PARTICIPATION LIMITATIONS: cleaning, laundry, shopping, and community activity  PERSONAL FACTORS: Past/current experiences are also affecting patient's functional outcome.   REHAB POTENTIAL: Good  CLINICAL DECISION MAKING: Evolving/moderate complexity  EVALUATION COMPLEXITY: High  GOALS: Goals reviewed with patient? Yes  SHORT TERM GOALS: Target date: 09/20/22  I with initial HEP Baseline: Goal status: 10/03/22 met  LONG TERM GOALS: Target date: 11/28/22  I with final HEP Baseline:  Goal status: 11/05/22-Program is being updated with weight training. ongoing  2.  Increase FOTO to at least 67 Baseline: 56.6 Goal status: Progressing 66 10/29/22  3.  Patient will achieve full AROM in B shoulders with pain < 3/10 Baseline: Max elevation of 75 degrees with severe pain. Goal status: 09/19/22-ongoingPartly Met 10/29/22 4.  Patient will demosntrate B shoulder strength of at least 4/5, with pain < 3/10 Baseline: Unable to tolerate any resistance or move against gravity due to pain. Goal status: 10/17/22, met  5.  Patient  will be able to sleep x at least 6 hours without waking due to pain in shoulders Baseline: 1-2 hours Goal status: Met 10/29/22  6.  Patient will be able to perform all self care and daily activities with B shoulder < 3/10, no compensations needed. Baseline:  Goal status: Met 10/29/22  PLAN:  PT FREQUENCY: 1-2x/week  PT DURATION: 12 weeks  PLANNED INTERVENTIONS: Therapeutic exercises, Therapeutic activity, Neuromuscular re-education, Balance training, Gait training, Patient/Family education, Self Care, Joint mobilization, Dry  Needling, Electrical stimulation, Spinal mobilization, Cryotherapy, Moist heat, Taping, Vasopneumatic device, Ultrasound, Ionotophoresis 4mg /ml Dexamethasone, and Manual therapy  PLAN FOR NEXT SESSION: How did she tolerate the weight training?  Oley Balm DPT 11/07/22 5:11 PM

## 2022-11-12 ENCOUNTER — Ambulatory Visit: Payer: 59 | Admitting: Physical Therapy

## 2022-11-12 ENCOUNTER — Encounter: Payer: Self-pay | Admitting: Physical Therapy

## 2022-11-12 DIAGNOSIS — M25511 Pain in right shoulder: Secondary | ICD-10-CM | POA: Diagnosis not present

## 2022-11-12 DIAGNOSIS — M6281 Muscle weakness (generalized): Secondary | ICD-10-CM

## 2022-11-12 DIAGNOSIS — M25512 Pain in left shoulder: Secondary | ICD-10-CM | POA: Diagnosis not present

## 2022-11-12 DIAGNOSIS — G8929 Other chronic pain: Secondary | ICD-10-CM | POA: Diagnosis not present

## 2022-11-12 DIAGNOSIS — R252 Cramp and spasm: Secondary | ICD-10-CM

## 2022-11-12 NOTE — Therapy (Signed)
OUTPATIENT PHYSICAL THERAPY SHOULDER TREATMENT   Patient Name: Shelly Cohen MRN: [id number] DOB:[birth year], 62 y.o., female Today's Date: 11/12/2022  END OF SESSION:  PT End of Session - 11/12/22 1633     Visit Number 14    Date for PT Re-Evaluation 11/28/22    PT Start Time 1630    PT Stop Time 1710    PT Time Calculation (min) 40 min    Activity Tolerance Patient tolerated treatment well    Behavior During Therapy WFL for tasks assessed/performed            Past Medical History:  Diagnosis Date   Alpha thalassemia trait    Arthritis    hips   Fibroadenoma    Glaucoma    monitored every 6months, right eye worse than left    History of positive PPD, treatment status unknown    Hypertension    OSA (obstructive sleep apnea)    mild with AHI 8/hr   Pre-diabetes    Thyroid disease    Past Surgical History:  Procedure Laterality Date   ABDOMINAL HYSTERECTOMY  2013   BREAST BIOPSY  2013   BUNIONECTOMY  10/2014   CHOLECYSTECTOMY  2015   COLONOSCOPY     hallux limi Right 2019   great  toe revision   TOTAL HIP ARTHROPLASTY Right 09/12/2018   Procedure: RIGHT TOTAL HIP ARTHROPLASTY ANTERIOR APPROACH;  Surgeon: Kathryne Hitch, MD;  Location: [facility];  Service: Orthopedics;  Laterality: Right;   TUBAL LIGATION  1990   UPPER GI ENDOSCOPY N/A 02/20/2021   Procedure: UPPER GI ENDOSCOPY;  Surgeon: Gaynelle Adu, MD;  Location: [facility];  Service: General;  Laterality: N/A;   Patient Active Problem List   Diagnosis Date Noted   Myalgia 04/05/2022   White coat syndrome with diagnosis of hypertension 03/22/2022   Chronic hip pain after total replacement of right hip joint 08/14/2021   Numbness and tingling of foot 04/04/2021   S/P laparoscopic sleeve gastrectomy 02/20/2021   OSA (obstructive sleep apnea) 02/09/2020   DJD (degenerative joint disease) 02/09/2020   DM (diabetes mellitus) (HCC) 08/13/2019   Primary insomnia 08/13/2019   Status post total  replacement of right hip 09/12/2018   Obesity (BMI 30-39.9) 08/22/2018   Trigger thumb, left thumb 05/07/2018   OA (osteoarthritis) of hip 04/22/2018   Open angle primary glaucoma 01/08/2018   Thalassemia trait, alpha 06/29/2016   Iron deficiency anemia 06/29/2016   S/P bunionectomy 06/29/2016   HTN (hypertension), benign 05/18/2016   Hypothyroidism 05/18/2016    PCP: Anne Ng, NP  REFERRING PROVIDER: Ralene Cork, DO  REFERRING DIAG:  Diagnosis  M25.511,G89.29,M25.512 (ICD-10-CM) - Chronic pain of both shoulders    THERAPY DIAG:  Chronic pain of both shoulders  Cramp and spasm  Muscle weakness (generalized)  Rationale for Evaluation and Treatment: Rehabilitation  ONSET DATE: 08/17/22  SUBJECTIVE:  SUBJECTIVE STATEMENT: Patient reports that her low back is sore. She thinks the stretches helped  PERTINENT HISTORY: R THR 2019  PAIN:  Are you having pain? Yes: NPRS scale: 0/10 Pain location: B shoulders, R > L Pain description: sharp, wakes her up, pops during the day, sometimes hurts. Aggravating factors: Lying down Relieving factors: Lying on her back with shoulders and arms supported.  PRECAUTIONS: None  WEIGHT BEARING RESTRICTIONS: No  FALLS:  Has patient fallen in last 6 months? No  LIVING ENVIRONMENT: Lives with: lives with their family Lives in: House/apartment Stairs: No issues Has following equipment at home: None  OCCUPATION: Works from home on computer, has standing desk  PLOF: Independent  PATIENT GOALS:Be able to return to normal daily activities without shoulder pain. Sleep without shoulder pain.  NEXT MD VISIT:   OBJECTIVE:   DIAGNOSTIC FINDINGS:  X rays 08/16/22 1. No acute osseous injury of bilateral shoulders.   PATIENT SURVEYS:  FOTO  56.6  COGNITION: Overall cognitive status: Within functional limits for tasks assessed     SENSATION: WFL  POSTURE: B shoulders hiked.  UPPER EXTREMITY ROM:   Active ROM Right eval Left eval Left  10/29/22 Left  10/29/22  Shoulder flexion 52 75 162 160  Shoulder extension      Shoulder abduction 54 48 169 140  Shoulder adduction      Shoulder internal rotation      Shoulder external rotation 12 8 90 90  Elbow flexion      Elbow extension      Wrist flexion      Wrist extension      Wrist ulnar deviation      Wrist radial deviation      Wrist pronation      Wrist supination      (Blank rows = not tested)  UPPER EXTREMITY MMT: Shoulders deferred as patient could not tolerate due to pain, the rest of UE 4/5.   SHOULDER SPECIAL TESTS: Impingement tests: Hawkins/Kennedy impingement test: positive  and Painful arc test: positive  Rotator cuff assessment: Drop arm test: positive  and Empty can test: positive  Biceps assessment: Speed's test: positive   JOINT MOBILITY TESTING:  B shoulders with severely limited mobility in all planes, fearful and guarding. B scapular mobility limited due to muscle guarding and pain.  PALPATION:  Very tight and TTP B cervical paraspinals, ant neck, up traps, LS, medial scapular muscles. Supraspinatus tendon and biceps tendon TTP, tight pects   TODAY'S TREATMENT:                                                                                                                                         DATE:  UBE L1 x 3 min forward and back Seated rows, lat pulls with 25#, 2 x 15 each Seated chest press, 15#, 2 x 10 reps Quadruped-cat/cow, chil's pose x 5 each Quadruped-  Alternating arm reach, leg reach, bird dog x 5 each Dead bugs-alternating arms, half dead bug with B hands on mat, alternating extending legs. 3 x 5 Leg press 20#, 2 x 10 B, 1 x 10 ULE Standing lateral hip stretch against wall  11/07/22 NuStep L5 x 6 minutes 3 way shoulder  elevation with 2# weights x 10 reps Overhead ball on wall taps, 4#, ball, 2 x 10 facing and 1 x 10 back to wall Ball on wall press, diamond pattern, 2 x 5 in each direction, 4# ball. Weighted carry, 20#, 150' each arm Standing shoulder ext with 15#, 2 x 10 Rows, 10#, 1 x 10 ER, 5#, 1 x 10 each Palof press, 10#, 2 x 10 reps each side B shoulder IR/ER stretch with strap, 2 x 10 sec Shoulder ext with strap behind back, 2 x 10 sec Lower traps stretch 2 x 10 sec  11/05/22 UBE L1 x 3 min forward and back Thoracic assessment due to pain-Very TTP over T6-7 spinous process, mildly TTP on either side. Gentle T spine mobilizations in prone with extension. Seated T spine mobilizations into ext Seated chest press, 25#, 2 x 10 reps Seated abdominal crunch with Black tband, 2 x 10 Lumbar extension against Black tband resistance, 2 x 10 reps Seated lat pulls, 25#, 2 x 10 reps Seated rows, 25#, 2 x 10 reps Palof press, 10#,  2 x 10 in each direction Forward flex over physioball, 5 x 10 sec  10/31/22 NuStep L5 x 6 min Stretching for neck and shoulders Low cerv and upper Thoracic stretch, UT stretch, post capsule stretch, rhomboid stretch Chest press 10#, 2 x 10 reps Lat pulls, 20#, 2 x 10 reps Rows, 20#, 15#, 1 x 10 reps each Seated knee flexion, 25#, 2 x 10, BLE. Seated knee ext, 5#, BLE, 2 x 10 Leg press, 30#, 2 x 10 reps. Standing hip abd against G tband, 2 x 10 each  10/29/22 NuStep L5 x 6 min Rows and Ext  Green 2x10  Shoulder ER yellow 2x10 Shoulder Flex & Abd 2lb 2x10 Horiz abd yellow 2x10 Triceps Ext 25lb 2x10 Biceps Curls 15lb 2x10 LE on Pball bridges, Oblq, K2C    PATIENT EDUCATION: Education details: POC, Person educated: Patient Education method: Medical illustrator Education comprehension: verbalized understanding and returned demonstration  HOME EXERCISE PROGRAM: Pendulum exercises for B shoulders, deep breathing to relax shoulder and facilitate some natural  traction. AAROM with dowel flexion, IR, abd, extension  [id number]  ASSESSMENT:  CLINICAL IMPRESSION: Patient reports improved thoracic pain, low back was sore this weekend. Introduced additional trunk stability and stretching exercises for HEP that she can do at home when unable to get to gym.  OBJECTIVE IMPAIRMENTS: decreased activity tolerance, decreased endurance, decreased mobility, decreased ROM, decreased strength, hypomobility, increased fascial restrictions, increased muscle spasms, impaired flexibility, impaired UE functional use, improper body mechanics, postural dysfunction, and pain.   ACTIVITY LIMITATIONS: carrying, lifting, bending, squatting, sleeping, bed mobility, dressing, reach over head, and hygiene/grooming  PARTICIPATION LIMITATIONS: cleaning, laundry, shopping, and community activity  PERSONAL FACTORS: Past/current experiences are also affecting patient's functional outcome.   REHAB POTENTIAL: Good  CLINICAL DECISION MAKING: Evolving/moderate complexity  EVALUATION COMPLEXITY: High  GOALS: Goals reviewed with patient? Yes  SHORT TERM GOALS: Target date: 09/20/22  I with initial HEP Baseline: Goal status: 10/03/22 met  LONG TERM GOALS: Target date: 11/28/22  I with final HEP Baseline:  Goal status: 11/05/22-Program is being updated with weight training. ongoing  2.  Increase FOTO to at least 67 Baseline: 56.6 Goal status: Progressing 66 10/29/22  3.  Patient will achieve full AROM in B shoulders with pain < 3/10 Baseline: Max elevation of 75 degrees with severe pain. Goal status: 09/19/22-ongoingPartly Met 10/29/22 4.  Patient will demosntrate B shoulder strength of at least 4/5, with pain < 3/10 Baseline: Unable to tolerate any resistance or move against gravity due to pain. Goal status: 10/17/22, met  5.  Patient will be able to sleep x at least 6 hours without waking due to pain in shoulders Baseline: 1-2 hours Goal status: Met 10/29/22  6.  Patient  will be able to perform all self care and daily activities with B shoulder < 3/10, no compensations needed. Baseline:  Goal status: Met 10/29/22  PLAN:  PT FREQUENCY: 1-2x/week  PT DURATION: 12 weeks  PLANNED INTERVENTIONS: Therapeutic exercises, Therapeutic activity, Neuromuscular re-education, Balance training, Gait training, Patient/Family education, Self Care, Joint mobilization, Dry Needling, Electrical stimulation, Spinal mobilization, Cryotherapy, Moist heat, Taping, Vasopneumatic device, Ultrasound, Ionotophoresis 4mg /ml Dexamethasone, and Manual therapy  PLAN FOR NEXT SESSION: How did she tolerate the weight training?  Oley Balm DPT 11/12/22 5:16 PM

## 2022-11-13 ENCOUNTER — Encounter: Payer: 59 | Attending: Nurse Practitioner | Admitting: Skilled Nursing Facility1

## 2022-11-13 ENCOUNTER — Encounter: Payer: Self-pay | Admitting: Skilled Nursing Facility1

## 2022-11-13 DIAGNOSIS — E669 Obesity, unspecified: Secondary | ICD-10-CM | POA: Diagnosis not present

## 2022-11-13 NOTE — Progress Notes (Signed)
Bariatric Nutrition Follow-Up Visit Medical Nutrition Therapy    Sleeve gastrectomy 02/20/2021  NUTRITION ASSESSMENT     Anthropometrics  Start weight at [facility]: 219.2 lbs (date: 09/21/2020)  Weight: virtual appt: pt identified by name and DBO; pt agreeable to limitations of this visit type   Clinical  Medical hx: HTN, DM Medications: see list Labs: WNL Notable signs/symptoms: stiff hip Any previous deficiencies? No   Body Composition Scale 04/20/2021 07/03/2021 09/07/2021 02/20/2022 05/15/2022  Current Body Weight 185 182.1 180.3 174.8 168.3  Total Body Fat % 40.6 40.4 40.1 39.2 38  Visceral Fat 13 13 12 12 11   Fat-Free Mass % 59.3 59.5 59.8 60.7 61.9   Total Body Water % 44.1 44.2 44.4 44.8 45.4  Muscle-Mass lbs 27.7 27.5 27.4 27.4 27.3  BMI 33.6 33.1 32.8 31.8 30.5  Body Fat Displacement              Torso  lbs 46.5 45.5 44.7 42.4 39.5         Left Leg  lbs 9.3 9.1 8.9 8.4 7.9         Right Leg  lbs 9.3 9.1 8.9 8.4 7.9         Left Arm  lbs 4.6 4.5 4.4 4.2 3.9         Right Arm   lbs 4.6 4.5 4.4 4.2 3.9     Lifestyle & Dietary Hx  Pt states she does not check her blood sugars.   Pt states she has stopped taking Wegovy and will now be going to [facility].  Pt states her bp has been about 132/82.  Pt states she feels really good about her foods and feels it is a new lifestyle for her not a diet.   Estimated daily fluid intake: 64+ oz Estimated daily protein intake: 80+ g Supplements: multi and calcium Current average weekly physical activity: in therapy due to back spasms  24-Hr Dietary Recall: 3 meals 5 days a week First Meal 7am: 1 egg + impossible sausage + wheat toast Snack:  carrots + humus or cheese Second Meal 12:  chicken salad: lettuce tomato on 1 slice wheat toast Snack:  Third Meal: tilapia + broccoli + cheese + half sweet potato Snack: cucumber or grapes Beverages: water, water + flavoring, 24 ounces fat free milk, sugar free propel,  suagr free juicy juice  Post-Op Goals/ Signs/ Symptoms Using straws: no Drinking while eating: no Chewing/swallowing difficulties: no Changes in vision: no Changes to mood/headaches: no Hair loss/changes to skin/nails: no Difficulty focusing/concentrating: no Sweating: no Limb weakness: no Dizziness/lightheadedness: no Palpitations: no  Carbonated/caffeinated beverages: no N/V/D/C/Gas: no Abdominal pain: no Dumping syndrome: no    NUTRITION DIAGNOSIS  Overweight/obesity (Hayden-3.3) related to past poor dietary habits and physical inactivity as evidenced by completed bariatric surgery and following dietary guidelines for continued weight loss and healthy nutrition status.     NUTRITION INTERVENTION: continued Nutrition counseling (C-1) and education (E-2) to facilitate bariatric surgery goals, including:  The importance of consuming adequate calories as well as certain nutrients daily due to the body's need for essential vitamins, minerals, and fats The importance of daily physical activity and to reach a goal of at least 150 minutes of moderate to vigorous physical activity weekly (or as directed by their physician) due to benefits such as increased musculature and improved lab values The importance of intuitive eating specifically learning hunger-satiety cues and understanding the importance of learning a new body: The importance of mindful  eating to avoid grazing behaviors  Encouraged patient to honor their body's internal hunger and fullness cues.  Throughout the day, check in mentally and rate hunger. Stop eating when satisfied not full regardless of how much food is left on the plate.  Get more if still hungry 20-30 minutes later.  The key is to honor satisfaction so throughout the meal, rate fullness factor and stop when comfortably satisfied not physically full. The key is to honor hunger and fullness without any feelings of guilt or shame.  Pay attention to what the internal cues  are, rather than any external factors. This will enhance the confidence you have in listening to your own body and following those internal cues enabling you to increase how often you eat when you are hungry not out of appetite and stop when you are satisfied not full.  Encouraged pt to continue to eat balanced meals inclusive of non starchy vegetables 2 times a day 7 days a week Encouraged pt to choose lean protein sources: limiting beef, pork, sausage, hotdogs, and lunch meat Encourage pt to choose healthy fats such as plant based limiting animal fats Encouraged pt to continue to drink a minium 64 fluid ounces with half being plain water to satisfy proper hydration   Handouts Previously Provided Include  Phase 7  Learning Style & Readiness for Change Teaching method utilized: Visual & Auditory  Demonstrated degree of understanding via: Teach Back  Readiness Level: action Barriers to learning/adherence to lifestyle change: none identified    MONITORING & EVALUATION Dietary intake, weekly physical activity, body weight  Next Steps Patient is to follow-up as needed with any future questions or concerns

## 2022-11-14 ENCOUNTER — Ambulatory Visit: Payer: 59 | Admitting: Physical Therapy

## 2022-11-14 ENCOUNTER — Encounter: Payer: Self-pay | Admitting: Physical Therapy

## 2022-11-14 DIAGNOSIS — R252 Cramp and spasm: Secondary | ICD-10-CM

## 2022-11-14 DIAGNOSIS — G8929 Other chronic pain: Secondary | ICD-10-CM | POA: Diagnosis not present

## 2022-11-14 DIAGNOSIS — M6281 Muscle weakness (generalized): Secondary | ICD-10-CM | POA: Diagnosis not present

## 2022-11-14 DIAGNOSIS — M25511 Pain in right shoulder: Secondary | ICD-10-CM | POA: Diagnosis not present

## 2022-11-14 DIAGNOSIS — M25512 Pain in left shoulder: Secondary | ICD-10-CM | POA: Diagnosis not present

## 2022-11-14 NOTE — Therapy (Signed)
OUTPATIENT PHYSICAL THERAPY SHOULDER TREATMENT   Patient Name: Shelly Cohen MRN: [id number] DOB:02-16-1961, 62 y.o., female Today's Date: 11/14/2022  END OF SESSION:  PT End of Session - 11/14/22 1636     Visit Number 15    Date for PT Re-Evaluation 11/28/22    PT Start Time 1632    PT Stop Time 1710    PT Time Calculation (min) 38 min    Activity Tolerance Patient tolerated treatment well    Behavior During Therapy WFL for tasks assessed/performed            Past Medical History:  Diagnosis Date   Alpha thalassemia trait    Arthritis    hips   Fibroadenoma    Glaucoma    monitored every 6months, right eye worse than left    History of positive PPD, treatment status unknown    Hypertension    OSA (obstructive sleep apnea)    mild with AHI 8/hr   Pre-diabetes    Thyroid disease    Past Surgical History:  Procedure Laterality Date   ABDOMINAL HYSTERECTOMY  2013   BREAST BIOPSY  2013   BUNIONECTOMY  10/2014   CHOLECYSTECTOMY  2015   COLONOSCOPY     hallux limi Right 2019   great  toe revision   TOTAL HIP ARTHROPLASTY Right 09/12/2018   Procedure: RIGHT TOTAL HIP ARTHROPLASTY ANTERIOR APPROACH;  Surgeon: Kathryne Hitch, MD;  Location: [facility];  Service: Orthopedics;  Laterality: Right;   TUBAL LIGATION  1990   UPPER GI ENDOSCOPY N/A 02/20/2021   Procedure: UPPER GI ENDOSCOPY;  Surgeon: Gaynelle Adu, MD;  Location: [facility];  Service: General;  Laterality: N/A;   Patient Active Problem List   Diagnosis Date Noted   Myalgia 04/05/2022   White coat syndrome with diagnosis of hypertension 03/22/2022   Chronic hip pain after total replacement of right hip joint 08/14/2021   Numbness and tingling of foot 04/04/2021   S/P laparoscopic sleeve gastrectomy 02/20/2021   OSA (obstructive sleep apnea) 02/09/2020   DJD (degenerative joint disease) 02/09/2020   DM (diabetes mellitus) (HCC) 08/13/2019   Primary insomnia 08/13/2019   Status post total  replacement of right hip 09/12/2018   Obesity (BMI 30-39.9) 08/22/2018   Trigger thumb, left thumb 05/07/2018   OA (osteoarthritis) of hip 04/22/2018   Open angle primary glaucoma 01/08/2018   Thalassemia trait, alpha 06/29/2016   Iron deficiency anemia 06/29/2016   S/P bunionectomy 06/29/2016   HTN (hypertension), benign 05/18/2016   Hypothyroidism 05/18/2016    PCP: Anne Ng, NP  REFERRING PROVIDER: Ralene Cork, DO  REFERRING DIAG:  Diagnosis  M25.511,G89.29,M25.512 (ICD-10-CM) - Chronic pain of both shoulders    THERAPY DIAG:  Chronic pain of both shoulders  Cramp and spasm  Muscle weakness (generalized)  Rationale for Evaluation and Treatment: Rehabilitation  ONSET DATE: 08/17/22  SUBJECTIVE:  SUBJECTIVE STATEMENT: Patient reports that her low back is sore. She thinks the stretches helped  PERTINENT HISTORY: R THR 2019  PAIN:  Are you having pain? Yes: NPRS scale: 0/10 Pain location: B shoulders, R > L Pain description: sharp, wakes her up, pops during the day, sometimes hurts. Aggravating factors: Lying down Relieving factors: Lying on her back with shoulders and arms supported.  PRECAUTIONS: None  WEIGHT BEARING RESTRICTIONS: No  FALLS:  Has patient fallen in last 6 months? No  LIVING ENVIRONMENT: Lives with: lives with their family Lives in: House/apartment Stairs: No issues Has following equipment at home: None  OCCUPATION: Works from home on computer, has standing desk  PLOF: Independent  PATIENT GOALS:Be able to return to normal daily activities without shoulder pain. Sleep without shoulder pain.  NEXT MD VISIT:   OBJECTIVE:   DIAGNOSTIC FINDINGS:  X rays 08/16/22 1. No acute osseous injury of bilateral shoulders.   PATIENT SURVEYS:  FOTO  56.6  COGNITION: Overall cognitive status: Within functional limits for tasks assessed     SENSATION: WFL  POSTURE: B shoulders hiked.  UPPER EXTREMITY ROM:   Active ROM Right eval Left eval Left  10/29/22 Left  10/29/22  Shoulder flexion 52 75 162 160  Shoulder extension      Shoulder abduction 54 48 169 140  Shoulder adduction      Shoulder internal rotation      Shoulder external rotation 12 8 90 90  Elbow flexion      Elbow extension      Wrist flexion      Wrist extension      Wrist ulnar deviation      Wrist radial deviation      Wrist pronation      Wrist supination      (Blank rows = not tested)  UPPER EXTREMITY MMT: Shoulders deferred as patient could not tolerate due to pain, the rest of UE 4/5.   SHOULDER SPECIAL TESTS: Impingement tests: Hawkins/Kennedy impingement test: positive  and Painful arc test: positive  Rotator cuff assessment: Drop arm test: positive  and Empty can test: positive  Biceps assessment: Speed's test: positive   JOINT MOBILITY TESTING:  B shoulders with severely limited mobility in all planes, fearful and guarding. B scapular mobility limited due to muscle guarding and pain.  PALPATION:  Very tight and TTP B cervical paraspinals, ant neck, up traps, LS, medial scapular muscles. Supraspinatus tendon and biceps tendon TTP, tight pects   TODAY'S TREATMENT:                                                                                                                                         DATE:  11/14/22 NuStep L5 x 6 minutes, then 1 x 30 sec with increased resistance and speed for shoulder press, rows, and then for legs only to introduce the activities. Standing elbow ext, 25#,  2 x 10 reps Standing elbow flex, 25#, 2 x 10 reps Standing half kneel with row, 15#, modified due to R hip weakness, 10 reps each Palof press, 20#, 2 x 10 each direction Power step ups onto 6" step with airex on top, BUE support ofor safety, 10 reps each  leg Jumping on mini tramp, straight jumps, jump out and in, alternating stride, skip jumps Skipping on floor, 2 x 25'  11/12/22 UBE L1 x 3 min forward and back Seated rows, lat pulls with 25#, 2 x 15 each Seated chest press, 15#, 2 x 10 reps Quadruped-cat/cow, chil's pose x 5 each Quadruped- Alternating arm reach, leg reach, bird dog x 5 each Dead bugs-alternating arms, half dead bug with B hands on mat, alternating extending legs. 3 x 5 Leg press 20#, 2 x 10 B, 1 x 10 ULE Standing lateral hip stretch against wall  11/07/22 NuStep L5 x 6 minutes 3 way shoulder elevation with 2# weights x 10 reps Overhead ball on wall taps, 4#, ball, 2 x 10 facing and 1 x 10 back to wall Ball on wall press, diamond pattern, 2 x 5 in each direction, 4# ball. Weighted carry, 20#, 150' each arm Standing shoulder ext with 15#, 2 x 10 Rows, 10#, 1 x 10 ER, 5#, 1 x 10 each Palof press, 10#, 2 x 10 reps each side B shoulder IR/ER stretch with strap, 2 x 10 sec Shoulder ext with strap behind back, 2 x 10 sec Lower traps stretch 2 x 10 sec  11/05/22 UBE L1 x 3 min forward and back Thoracic assessment due to pain-Very TTP over T6-7 spinous process, mildly TTP on either side. Gentle T spine mobilizations in prone with extension. Seated T spine mobilizations into ext Seated chest press, 25#, 2 x 10 reps Seated abdominal crunch with Black tband, 2 x 10 Lumbar extension against Black tband resistance, 2 x 10 reps Seated lat pulls, 25#, 2 x 10 reps Seated rows, 25#, 2 x 10 reps Palof press, 10#,  2 x 10 in each direction Forward flex over physioball, 5 x 10 sec  10/31/22 NuStep L5 x 6 min Stretching for neck and shoulders Low cerv and upper Thoracic stretch, UT stretch, post capsule stretch, rhomboid stretch Chest press 10#, 2 x 10 reps Lat pulls, 20#, 2 x 10 reps Rows, 20#, 15#, 1 x 10 reps each Seated knee flexion, 25#, 2 x 10, BLE. Seated knee ext, 5#, BLE, 2 x 10 Leg press, 30#, 2 x 10 reps. Standing  hip abd against G tband, 2 x 10 each  10/29/22 NuStep L5 x 6 min Rows and Ext  Green 2x10  Shoulder ER yellow 2x10 Shoulder Flex & Abd 2lb 2x10 Horiz abd yellow 2x10 Triceps Ext 25lb 2x10 Biceps Curls 15lb 2x10 LE on Pball bridges, Oblq, K2C    PATIENT EDUCATION: Education details: POC, Person educated: Patient Education method: Medical illustrator Education comprehension: verbalized understanding and returned demonstration  HOME EXERCISE PROGRAM: Pendulum exercises for B shoulders, deep breathing to relax shoulder and facilitate some natural traction. AAROM with dowel flexion, IR, abd, extension  [id number]  ASSESSMENT:  CLINICAL IMPRESSION: Patient reports improved pain. Progressed strength training, introducing multiple new activities for trunk stability, strength, coordinated muscle control, and power. Increased difficulty on RLE due to previous THR. She tolerated all activity well, no instability noted, no pain.  OBJECTIVE IMPAIRMENTS: decreased activity tolerance, decreased endurance, decreased mobility, decreased ROM, decreased strength, hypomobility, increased fascial restrictions, increased muscle spasms,  impaired flexibility, impaired UE functional use, improper body mechanics, postural dysfunction, and pain.   ACTIVITY LIMITATIONS: carrying, lifting, bending, squatting, sleeping, bed mobility, dressing, reach over head, and hygiene/grooming  PARTICIPATION LIMITATIONS: cleaning, laundry, shopping, and community activity  PERSONAL FACTORS: Past/current experiences are also affecting patient's functional outcome.   REHAB POTENTIAL: Good  CLINICAL DECISION MAKING: Evolving/moderate complexity  EVALUATION COMPLEXITY: High  GOALS: Goals reviewed with patient? Yes  SHORT TERM GOALS: Target date: 09/20/22  I with initial HEP Baseline: Goal status: 10/03/22 met  LONG TERM GOALS: Target date: 11/28/22  I with final HEP Baseline:  Goal status: 11/05/22-Program  is being updated with weight training. 11/14/22-ongoing  2.  Increase FOTO to at least 67 Baseline: 56.6 Goal status: Progressing 66 10/29/22  3.  Patient will achieve full AROM in B shoulders with pain < 3/10 Baseline: Max elevation of 75 degrees with severe pain. Goal status: 09/19/22-ongoingPartly Met 10/29/22 4.  Patient will demosntrate B shoulder strength of at least 4/5, with pain < 3/10 Baseline: Unable to tolerate any resistance or move against gravity due to pain. Goal status: 10/17/22, met  5.  Patient will be able to sleep x at least 6 hours without waking due to pain in shoulders Baseline: 1-2 hours Goal status: Met 10/29/22  6.  Patient will be able to perform all self care and daily activities with B shoulder < 3/10, no compensations needed. Baseline:  Goal status: Met 10/29/22  PLAN:  PT FREQUENCY: 1-2x/week  PT DURATION: 12 weeks  PLANNED INTERVENTIONS: Therapeutic exercises, Therapeutic activity, Neuromuscular re-education, Balance training, Gait training, Patient/Family education, Self Care, Joint mobilization, Dry Needling, Electrical stimulation, Spinal mobilization, Cryotherapy, Moist heat, Taping, Vasopneumatic device, Ultrasound, Ionotophoresis 4mg /ml Dexamethasone, and Manual therapy  PLAN FOR NEXT SESSION: How did she tolerate the weight training?  Oley Balm DPT 11/14/22 5:14 PM

## 2022-11-19 ENCOUNTER — Other Ambulatory Visit: Payer: Self-pay

## 2022-11-19 ENCOUNTER — Ambulatory Visit: Payer: 59 | Admitting: Physical Therapy

## 2022-11-21 ENCOUNTER — Ambulatory Visit: Payer: 59 | Admitting: Physical Therapy

## 2022-11-21 LAB — HM DIABETES EYE EXAM

## 2022-11-28 ENCOUNTER — Encounter: Payer: Self-pay | Admitting: Physical Therapy

## 2022-11-28 ENCOUNTER — Ambulatory Visit: Payer: 59 | Admitting: Physical Therapy

## 2022-11-28 DIAGNOSIS — R252 Cramp and spasm: Secondary | ICD-10-CM | POA: Diagnosis not present

## 2022-11-28 DIAGNOSIS — M25512 Pain in left shoulder: Secondary | ICD-10-CM | POA: Diagnosis not present

## 2022-11-28 DIAGNOSIS — G8929 Other chronic pain: Secondary | ICD-10-CM

## 2022-11-28 DIAGNOSIS — M6281 Muscle weakness (generalized): Secondary | ICD-10-CM

## 2022-11-28 DIAGNOSIS — M25511 Pain in right shoulder: Secondary | ICD-10-CM | POA: Diagnosis not present

## 2022-11-28 NOTE — Therapy (Signed)
OUTPATIENT PHYSICAL THERAPY SHOULDER TREATMENT  PHYSICAL THERAPY DISCHARGE SUMMARY  Visits from Start of Care: 16  Current functional level related to goals / functional outcomes: All goals met   Remaining deficits: Occasional pain, but patient can manage it with stretching ad strengthening.   Education / Equipment: HEP   Patient agrees to discharge. Patient goals were met. Patient is being discharged due to meeting the stated rehab goals.   Patient Name: Shelly Cohen MRN: [id number] DOB:1960/07/23, 62 y.o., female Today's Date: 11/28/2022  END OF SESSION:  PT End of Session - 11/28/22 1628     Visit Number 16    Date for PT Re-Evaluation 11/28/22    PT Start Time 1628            Past Medical History:  Diagnosis Date   Alpha thalassemia trait    Arthritis    hips   Fibroadenoma    Glaucoma    monitored every 6months, right eye worse than left    History of positive PPD, treatment status unknown    Hypertension    OSA (obstructive sleep apnea)    mild with AHI 8/hr   Pre-diabetes    Thyroid disease    Past Surgical History:  Procedure Laterality Date   ABDOMINAL HYSTERECTOMY  2013   BREAST BIOPSY  2013   BUNIONECTOMY  10/2014   CHOLECYSTECTOMY  2015   COLONOSCOPY     hallux limi Right 2019   great  toe revision   TOTAL HIP ARTHROPLASTY Right 09/12/2018   Procedure: RIGHT TOTAL HIP ARTHROPLASTY ANTERIOR APPROACH;  Surgeon: Kathryne Hitch, MD;  Location: [facility];  Service: Orthopedics;  Laterality: Right;   TUBAL LIGATION  1990   UPPER GI ENDOSCOPY N/A 02/20/2021   Procedure: UPPER GI ENDOSCOPY;  Surgeon: Gaynelle Adu, MD;  Location: [facility];  Service: General;  Laterality: N/A;   Patient Active Problem List   Diagnosis Date Noted   Myalgia 04/05/2022   White coat syndrome with diagnosis of hypertension 03/22/2022   Chronic hip pain after total replacement of right hip joint 08/14/2021   Numbness and tingling of foot 04/04/2021   S/P  laparoscopic sleeve gastrectomy 02/20/2021   OSA (obstructive sleep apnea) 02/09/2020   DJD (degenerative joint disease) 02/09/2020   DM (diabetes mellitus) (HCC) 08/13/2019   Primary insomnia 08/13/2019   Status post total replacement of right hip 09/12/2018   Obesity (BMI 30-39.9) 08/22/2018   Trigger thumb, left thumb 05/07/2018   OA (osteoarthritis) of hip 04/22/2018   Open angle primary glaucoma 01/08/2018   Thalassemia trait, alpha 06/29/2016   Iron deficiency anemia 06/29/2016   S/P bunionectomy 06/29/2016   HTN (hypertension), benign 05/18/2016   Hypothyroidism 05/18/2016    PCP: Anne Ng, NP  REFERRING PROVIDER: Ralene Cork, DO  REFERRING DIAG:  Diagnosis  M25.511,G89.29,M25.512 (ICD-10-CM) - Chronic pain of both shoulders    THERAPY DIAG:  Chronic pain of both shoulders  Cramp and spasm  Muscle weakness (generalized)  Rationale for Evaluation and Treatment: Rehabilitation  ONSET DATE: 08/17/22  SUBJECTIVE:  SUBJECTIVE STATEMENT: Patient reports that she now has tools to manage her pain.  PERTINENT HISTORY: R THR 2019  PAIN:  Are you having pain? Yes: NPRS scale: 0/10 Pain location: B shoulders, R > L Pain description: sharp, wakes her up, pops during the day, sometimes hurts. Aggravating factors: Lying down Relieving factors: Lying on her back with shoulders and arms supported.  PRECAUTIONS: None  WEIGHT BEARING RESTRICTIONS: No  FALLS:  Has patient fallen in last 6 months? No  LIVING ENVIRONMENT: Lives with: lives with their family Lives in: House/apartment Stairs: No issues Has following equipment at home: None  OCCUPATION: Works from home on computer, has standing desk  PLOF: Independent  PATIENT GOALS:Be able to return to normal daily  activities without shoulder pain. Sleep without shoulder pain.  NEXT MD VISIT:   OBJECTIVE:   DIAGNOSTIC FINDINGS:  X rays 08/16/22 1. No acute osseous injury of bilateral shoulders.   PATIENT SURVEYS:  FOTO 56.6  COGNITION: Overall cognitive status: Within functional limits for tasks assessed     SENSATION: WFL  POSTURE: B shoulders hiked.  UPPER EXTREMITY ROM:   Active ROM Right eval Left eval Left  10/29/22 Left  10/29/22  Shoulder flexion 52 75 162 160  Shoulder extension      Shoulder abduction 54 48 169 140  Shoulder adduction      Shoulder internal rotation      Shoulder external rotation 12 8 90 90  Elbow flexion      Elbow extension      Wrist flexion      Wrist extension      Wrist ulnar deviation      Wrist radial deviation      Wrist pronation      Wrist supination      (Blank rows = not tested)  UPPER EXTREMITY MMT: Shoulders deferred as patient could not tolerate due to pain, the rest of UE 4/5.   SHOULDER SPECIAL TESTS: Impingement tests: Hawkins/Kennedy impingement test: positive  and Painful arc test: positive  Rotator cuff assessment: Drop arm test: positive  and Empty can test: positive  Biceps assessment: Speed's test: positive   JOINT MOBILITY TESTING:  B shoulders with severely limited mobility in all planes, fearful and guarding. B scapular mobility limited due to muscle guarding and pain.  PALPATION:  Very tight and TTP B cervical paraspinals, ant neck, up traps, LS, medial scapular muscles. Supraspinatus tendon and biceps tendon TTP, tight pects   TODAY'S TREATMENT:                                                                                                                                         DATE:  11/28/22 UBE L2, 3 min for/3 back FOTO retest-71 Goblet squats with 5# weight to 16", 2 x 10 Dead left with 5# weight to 16", 2 x 10 reps Single limb dead lift to  16", 2 x 5 each leg. Heel raises on black bar, 2 x 10  reps Standing on rocker board lateral weight shifts.  11/14/22 NuStep L5 x 6 minutes, then 1 x 30 sec with increased resistance and speed for shoulder press, rows, and then for legs only to introduce the activities. Standing elbow ext, 25#, 2 x 10 reps Standing elbow flex, 25#, 2 x 10 reps Standing half kneel with row, 15#, modified due to R hip weakness, 10 reps each Palof press, 20#, 2 x 10 each direction Power step ups onto 6" step with airex on top, BUE support ofor safety, 10 reps each leg Jumping on mini tramp, straight jumps, jump out and in, alternating stride, skip jumps Skipping on floor, 2 x 25'  11/12/22 UBE L1 x 3 min forward and back Seated rows, lat pulls with 25#, 2 x 15 each Seated chest press, 15#, 2 x 10 reps Quadruped-cat/cow, chil's pose x 5 each Quadruped- Alternating arm reach, leg reach, bird dog x 5 each Dead bugs-alternating arms, half dead bug with B hands on mat, alternating extending legs. 3 x 5 Leg press 20#, 2 x 10 B, 1 x 10 ULE Standing lateral hip stretch against wall  11/07/22 NuStep L5 x 6 minutes 3 way shoulder elevation with 2# weights x 10 reps Overhead ball on wall taps, 4#, ball, 2 x 10 facing and 1 x 10 back to wall Ball on wall press, diamond pattern, 2 x 5 in each direction, 4# ball. Weighted carry, 20#, 150' each arm Standing shoulder ext with 15#, 2 x 10 Rows, 10#, 1 x 10 ER, 5#, 1 x 10 each Palof press, 10#, 2 x 10 reps each side B shoulder IR/ER stretch with strap, 2 x 10 sec Shoulder ext with strap behind back, 2 x 10 sec Lower traps stretch 2 x 10 sec  11/05/22 UBE L1 x 3 min forward and back Thoracic assessment due to pain-Very TTP over T6-7 spinous process, mildly TTP on either side. Gentle T spine mobilizations in prone with extension. Seated T spine mobilizations into ext Seated chest press, 25#, 2 x 10 reps Seated abdominal crunch with Black tband, 2 x 10 Lumbar extension against Black tband resistance, 2 x 10 reps Seated  lat pulls, 25#, 2 x 10 reps Seated rows, 25#, 2 x 10 reps Palof press, 10#,  2 x 10 in each direction Forward flex over physioball, 5 x 10 sec  10/31/22 NuStep L5 x 6 min Stretching for neck and shoulders Low cerv and upper Thoracic stretch, UT stretch, post capsule stretch, rhomboid stretch Chest press 10#, 2 x 10 reps Lat pulls, 20#, 2 x 10 reps Rows, 20#, 15#, 1 x 10 reps each Seated knee flexion, 25#, 2 x 10, BLE. Seated knee ext, 5#, BLE, 2 x 10 Leg press, 30#, 2 x 10 reps. Standing hip abd against G tband, 2 x 10 each  10/29/22 NuStep L5 x 6 min Rows and Ext  Green 2x10  Shoulder ER yellow 2x10 Shoulder Flex & Abd 2lb 2x10 Horiz abd yellow 2x10 Triceps Ext 25lb 2x10 Biceps Curls 15lb 2x10 LE on Pball bridges, Oblq, K2C    PATIENT EDUCATION: Education details: POC, Person educated: Patient Education method: Medical illustrator Education comprehension: verbalized understanding and returned demonstration  HOME EXERCISE PROGRAM: Pendulum exercises for B shoulders, deep breathing to relax shoulder and facilitate some natural traction. AAROM with dowel flexion, IR, abd, extension  [id number]  ASSESSMENT:  CLINICAL IMPRESSION: Patient reports improved  pain. Performed re-assessment for D/C. Patient has met all goals. She reports that she feels like she now has tools to address pain if it returns. She plans to continue with strengthening activities at home.  OBJECTIVE IMPAIRMENTS: decreased activity tolerance, decreased endurance, decreased mobility, decreased ROM, decreased strength, hypomobility, increased fascial restrictions, increased muscle spasms, impaired flexibility, impaired UE functional use, improper body mechanics, postural dysfunction, and pain.   ACTIVITY LIMITATIONS: carrying, lifting, bending, squatting, sleeping, bed mobility, dressing, reach over head, and hygiene/grooming  PARTICIPATION LIMITATIONS: cleaning, laundry, shopping, and community  activity  PERSONAL FACTORS: Past/current experiences are also affecting patient's functional outcome.   REHAB POTENTIAL: Good  CLINICAL DECISION MAKING: Evolving/moderate complexity  EVALUATION COMPLEXITY: High  GOALS: Goals reviewed with patient? Yes  SHORT TERM GOALS: Target date: 09/20/22  I with initial HEP Baseline: Goal status: 10/03/22 met  LONG TERM GOALS: Target date: 11/28/22  I with final HEP Baseline:  Goal status: 11/05/22-Program is being updated with weight training. 11/28/22-met  2.  Increase FOTO to at least 67 Baseline: 56.6 Goal status: Progressing 66 10/29/22, 71, 11/28/22, met  3.  Patient will achieve full AROM in B shoulders with pain < 3/10 Baseline: Max elevation of 75 degrees with severe pain. Goal status: 09/19/22-ongoingPartly Met 10/29/22 4.  Patient will demosntrate B shoulder strength of at least 4/5, with pain < 3/10 Baseline: Unable to tolerate any resistance or move against gravity due to pain. Goal status: 10/17/22, met  5.  Patient will be able to sleep x at least 6 hours without waking due to pain in shoulders Baseline: 1-2 hours Goal status: Met 10/29/22  6.  Patient will be able to perform all self care and daily activities with B shoulder < 3/10, no compensations needed. Baseline:  Goal status: Met 10/29/22  PLAN:  PT FREQUENCY: 1-2x/week  PT DURATION: 12 weeks  PLANNED INTERVENTIONS: Therapeutic exercises, Therapeutic activity, Neuromuscular re-education, Balance training, Gait training, Patient/Family education, Self Care, Joint mobilization, Dry Needling, Electrical stimulation, Spinal mobilization, Cryotherapy, Moist heat, Taping, Vasopneumatic device, Ultrasound, Ionotophoresis 4mg /ml Dexamethasone, and Manual therapy  PLAN FOR NEXT SESSION: How did she tolerate the weight training?  Oley Balm DPT 11/28/22 5:08 PM

## 2022-12-04 ENCOUNTER — Other Ambulatory Visit: Payer: Self-pay | Admitting: Nurse Practitioner

## 2022-12-04 DIAGNOSIS — Z1231 Encounter for screening mammogram for malignant neoplasm of breast: Secondary | ICD-10-CM

## 2022-12-07 ENCOUNTER — Ambulatory Visit
Admission: RE | Admit: 2022-12-07 | Discharge: 2022-12-07 | Disposition: A | Discharge status: Home / Self Care | Payer: 59 | Source: Ambulatory Visit | Attending: Nurse Practitioner | Admitting: Nurse Practitioner

## 2022-12-07 DIAGNOSIS — Z1231 Encounter for screening mammogram for malignant neoplasm of breast: Secondary | ICD-10-CM

## 2023-01-02 ENCOUNTER — Other Ambulatory Visit: Payer: Self-pay

## 2023-01-02 ENCOUNTER — Other Ambulatory Visit (HOSPITAL_COMMUNITY): Payer: Self-pay

## 2023-01-04 ENCOUNTER — Other Ambulatory Visit: Payer: Self-pay

## 2023-01-04 ENCOUNTER — Other Ambulatory Visit (HOSPITAL_COMMUNITY): Payer: Self-pay

## 2023-01-14 ENCOUNTER — Other Ambulatory Visit (HOSPITAL_COMMUNITY): Payer: Self-pay

## 2023-01-16 ENCOUNTER — Other Ambulatory Visit: Payer: Self-pay | Admitting: Nurse Practitioner

## 2023-01-16 DIAGNOSIS — I10 Essential (primary) hypertension: Secondary | ICD-10-CM

## 2023-01-17 ENCOUNTER — Other Ambulatory Visit: Payer: Self-pay

## 2023-01-17 MED ORDER — AMLODIPINE BESYLATE 5 MG PO TABS
5.0000 mg | ORAL_TABLET | Freq: Every evening | ORAL | 1 refills | Status: DC
Start: 2023-01-17 — End: 2023-05-20
  Filled 2023-01-17: qty 90, 90d supply, fill #0
  Filled 2023-04-12: qty 90, 90d supply, fill #1

## 2023-01-18 ENCOUNTER — Other Ambulatory Visit (HOSPITAL_COMMUNITY): Payer: Self-pay

## 2023-01-21 ENCOUNTER — Other Ambulatory Visit: Payer: Self-pay

## 2023-01-21 ENCOUNTER — Other Ambulatory Visit (HOSPITAL_COMMUNITY): Payer: Self-pay

## 2023-01-22 ENCOUNTER — Other Ambulatory Visit (HOSPITAL_COMMUNITY): Payer: Self-pay

## 2023-01-23 ENCOUNTER — Other Ambulatory Visit (HOSPITAL_COMMUNITY): Payer: Self-pay

## 2023-01-23 ENCOUNTER — Other Ambulatory Visit: Payer: Self-pay | Admitting: Nurse Practitioner

## 2023-01-24 ENCOUNTER — Other Ambulatory Visit (HOSPITAL_COMMUNITY): Payer: Self-pay

## 2023-01-24 MED ORDER — PANTOPRAZOLE SODIUM 40 MG PO TBEC
40.0000 mg | DELAYED_RELEASE_TABLET | Freq: Every day | ORAL | 0 refills | Status: DC
  Filled 2023-01-24: qty 90, 90d supply, fill #0

## 2023-01-30 ENCOUNTER — Other Ambulatory Visit (HOSPITAL_COMMUNITY): Payer: Self-pay

## 2023-01-30 MED ORDER — PANTOPRAZOLE SODIUM 40 MG PO TBEC: 40.0000 mg | DELAYED_RELEASE_TABLET | Freq: Every day | ORAL | 0 refills | Status: DC

## 2023-01-31 ENCOUNTER — Other Ambulatory Visit (HOSPITAL_COMMUNITY): Payer: Self-pay

## 2023-01-31 MED ORDER — PANTOPRAZOLE SODIUM 40 MG PO TBEC
40.0000 mg | DELAYED_RELEASE_TABLET | Freq: Every day | ORAL | 0 refills | Status: DC
  Filled 2023-01-31: qty 90, 90d supply, fill #0

## 2023-02-01 ENCOUNTER — Other Ambulatory Visit (HOSPITAL_COMMUNITY): Payer: Self-pay

## 2023-02-04 ENCOUNTER — Other Ambulatory Visit (HOSPITAL_COMMUNITY): Payer: Self-pay

## 2023-02-04 ENCOUNTER — Other Ambulatory Visit: Payer: Self-pay

## 2023-02-04 ENCOUNTER — Other Ambulatory Visit: Payer: Self-pay | Admitting: Nurse Practitioner

## 2023-02-04 DIAGNOSIS — E039 Hypothyroidism, unspecified: Secondary | ICD-10-CM

## 2023-02-04 MED ORDER — LEVOTHYROXINE SODIUM 50 MCG PO TABS
50.0000 ug | ORAL_TABLET | Freq: Every day | ORAL | 1 refills | Status: DC
Start: 2023-02-04 — End: 2023-05-20
  Filled 2023-02-04: qty 90, 90d supply, fill #0
  Filled 2023-05-05: qty 90, 90d supply, fill #1

## 2023-02-07 ENCOUNTER — Ambulatory Visit: Payer: 59 | Admitting: Nurse Practitioner

## 2023-02-14 ENCOUNTER — Ambulatory Visit (INDEPENDENT_AMBULATORY_CARE_PROVIDER_SITE_OTHER): Payer: 59 | Admitting: Nurse Practitioner

## 2023-02-14 ENCOUNTER — Encounter: Payer: Self-pay | Admitting: Nurse Practitioner

## 2023-02-14 ENCOUNTER — Other Ambulatory Visit: Payer: Self-pay

## 2023-02-14 ENCOUNTER — Other Ambulatory Visit (HOSPITAL_COMMUNITY): Payer: Self-pay

## 2023-02-14 VITALS — BP 110/80 | HR 82 | Temp 98.0°F | Resp 16 | Ht 62.0 in | Wt 179.4 lb

## 2023-02-14 DIAGNOSIS — E1165 Type 2 diabetes mellitus with hyperglycemia: Secondary | ICD-10-CM

## 2023-02-14 DIAGNOSIS — E039 Hypothyroidism, unspecified: Secondary | ICD-10-CM | POA: Diagnosis not present

## 2023-02-14 DIAGNOSIS — Z7985 Long-term (current) use of injectable non-insulin antidiabetic drugs: Secondary | ICD-10-CM | POA: Diagnosis not present

## 2023-02-14 DIAGNOSIS — Z1211 Encounter for screening for malignant neoplasm of colon: Secondary | ICD-10-CM

## 2023-02-14 DIAGNOSIS — I10 Essential (primary) hypertension: Secondary | ICD-10-CM | POA: Diagnosis not present

## 2023-02-14 LAB — BASIC METABOLIC PANEL
BUN: 11 mg/dL (ref 6–23)
CO2: 29 mEq/L (ref 19–32)
Calcium: 9.9 mg/dL (ref 8.4–10.5)
Chloride: 104 mEq/L (ref 96–112)
Creatinine, Ser: 0.69 mg/dL (ref 0.40–1.20)
GFR: 93.51 mL/min (ref >60.00)
Glucose, Bld: 93 mg/dL (ref 70–99)
Potassium: 3.8 mEq/L (ref 3.5–5.1)
Sodium: 140 mEq/L (ref 135–145)

## 2023-02-14 LAB — TSH: TSH: 2.1 u[IU]/mL (ref 0.35–5.50)

## 2023-02-14 LAB — T4, FREE: Free T4: 0.97 ng/dL (ref 0.60–1.60)

## 2023-02-14 LAB — HEPATIC FUNCTION PANEL
ALT: 25 U/L (ref 0–35)
AST: 25 U/L (ref 0–37)
Albumin: 4.1 g/dL (ref 3.5–5.2)
Alkaline Phosphatase: 73 U/L (ref 39–117)
Bilirubin, Direct: 0.1 mg/dL (ref 0.0–0.3)
Total Bilirubin: 0.5 mg/dL (ref 0.2–1.2)
Total Protein: 7.2 g/dL (ref 6.0–8.3)

## 2023-02-14 LAB — HEMOGLOBIN A1C: Hgb A1c MFr Bld: 5.7 % (ref 4.6–6.5)

## 2023-02-14 MED ORDER — OZEMPIC (0.25 OR 0.5 MG/DOSE) 2 MG/3ML ~~LOC~~ SOPN
0.5000 mg | PEN_INJECTOR | SUBCUTANEOUS | 2 refills | Status: DC
Start: 2023-02-14 — End: 2023-05-11
  Filled 2023-02-14: qty 3, 30d supply, fill #0
  Filled 2023-03-11: qty 3, 28d supply, fill #1
  Filled 2023-04-08: qty 3, 28d supply, fill #2

## 2023-02-14 NOTE — Progress Notes (Signed)
Established Patient Visit  Patient: Shelly Cohen   DOB: [birth year]   62 y.o. Female  MRN: [id number] Visit Date: 02/14/2023  Subjective:    Chief Complaint  Patient presents with   Medical Management of Chronic Issues    Fasting    HPI HTN (hypertension), benign Hx of white coat syndrome BP at goal BP Readings from Last 3 Encounters:  02/14/23 110/80  09/26/22 (!) 203/85  08/29/22 126/79    Maintain med doses Repeat BMP F/up in 3-14months  Hypothyroidism Repeat TSH and T4 Maintain levothyroxine dose  DM (diabetes mellitus) (HCC) Wegovy discontinued due to cost. She reports increase in weight and appetite despite diet modification under the guidance of nutritionist and daily exercise. She is interested in starting ozempic injection. She is aware of med side effects. No adverse effects during use of wegovy. Up to date with eye exam: report requested from [facility] Normal foot exam today Negative UACr LDL at goal Repeat hgbA1c, and BMP Start Ozempic titrate from 0.25mg  to 0.5mg  in 2weeks. F/up in 3months  BP Readings from Last 3 Encounters:  02/14/23 110/80  09/26/22 (!) 203/85  08/29/22 126/79    Wt Readings from Last 3 Encounters:  02/14/23 179 lb 6.4 oz (81.4 kg)  09/26/22 165 lb (74.8 kg)  08/16/22 166 lb (75.3 kg)    Reviewed medical, surgical, and social history today  Medications: Outpatient Medications Prior to Visit  Medication Sig   albuterol (VENTOLIN HFA) 108 (90 Base) MCG/ACT inhaler Inhale 1-2 puffs into the lungs every 6 (six) hours as needed for wheezing or shortness of breath.   amLODipine (NORVASC) 5 MG tablet Take 1 tablet (5 mg total) by mouth every evening.   BIOTIN PO Take 1 tablet by mouth daily.   CALCIUM-VITAMIN D PO Take 1 tablet by mouth daily.   carvedilol (COREG) 3.125 MG tablet Take 1 tablet (3.125 mg total) by mouth 2 (two) times daily.   diclofenac (VOLTAREN) 75 MG EC tablet Take 1 tablet twice daily with food  for 5 days. Then take as needed.   docusate sodium (COLACE) 100 MG capsule Take 100 mg by mouth every other day.   fluticasone (FLONASE) 50 MCG/ACT nasal spray Place 1 spray into both nostrils daily as needed for allergies or rhinitis.   gabapentin (NEURONTIN) 100 MG capsule Take 1 capsule (100 mg total) by mouth 3 (three) times daily.   KRILL OIL PO Take 1 capsule by mouth daily.   latanoprost (XALATAN) 0.005 % ophthalmic solution Place 1 drop into both eyes every evening.   levothyroxine (SYNTHROID) 50 MCG tablet Take 1 tablet (50 mcg total) by mouth daily before breakfast.   MELATONIN PO Take 1 tablet by mouth at bedtime.   [DISCONTINUED] pantoprazole (PROTONIX) 40 MG tablet Take 1 tablet (40 mg total) by mouth daily.   [DISCONTINUED] pantoprazole (PROTONIX) 40 MG tablet Take 1 tablet (40 mg total) by mouth daily.   [DISCONTINUED] Semaglutide-Weight Management (WEGOVY) 2.4 MG/0.75ML SOAJ Inject 2.4 mg into the skin every 7 (seven) days.   [DISCONTINUED] levofloxacin (LEVAQUIN) 500 MG tablet Take 1 tablet (500 mg total) by mouth daily.   [DISCONTINUED] pantoprazole (PROTONIX) 40 MG tablet Take 1 tablet (40 mg total) by mouth daily.   [DISCONTINUED] Semaglutide-Weight Management (WEGOVY) 2.4 MG/0.75ML SOAJ Inject 2.4 mg into the skin once a week.   No facility-administered medications prior to visit.   Reviewed past medical and social history.  ROS per HPI above  Last metabolic panel Lab Results  Component Value Date   GLUCOSE 78 08/08/2022   NA 140 08/08/2022   K 4.1 08/08/2022   CL 102 08/08/2022   CO2 29 08/08/2022   BUN 16 08/08/2022   CREATININE 0.69 08/08/2022   GFR 93.85 08/08/2022   CALCIUM 9.5 08/08/2022   PHOS 3.5 08/08/2022   PROT 7.6 01/16/2022   ALBUMIN 4.4 08/08/2022   BILITOT 0.6 01/16/2022   ALKPHOS 55 01/16/2022   AST 17 01/16/2022   ALT 13 01/16/2022   ANIONGAP 8 01/16/2022   Last lipids Lab Results  Component Value Date   CHOL 174 08/08/2022   HDL  71.10 08/08/2022   LDLCALC 85 08/08/2022   TRIG 86.0 08/08/2022   CHOLHDL 2 08/08/2022   Last hemoglobin A1c Lab Results  Component Value Date   HGBA1C 5.6 08/08/2022   Last thyroid functions Lab Results  Component Value Date   TSH 2.01 08/08/2022   T4TOTAL 9.9 02/27/2021        Objective:  BP 110/80 (BP Location: Left Arm, Patient Position: Sitting, Cuff Size: Normal)   Pulse 82   Temp 98 F (36.7 C)   Resp 16   Ht 5\' 2"  (1.575 m)   Wt 179 lb 6.4 oz (81.4 kg)   SpO2 100%   BMI 32.81 kg/m      Physical Exam  No results found for any visits on 02/14/23.    Assessment & Plan:    Problem List Items Addressed This Visit     DM (diabetes mellitus) (HCC)    Wegovy discontinued due to cost. She reports increase in weight and appetite despite diet modification under the guidance of nutritionist and daily exercise. She is interested in starting ozempic injection. She is aware of med side effects. No adverse effects during use of wegovy. Up to date with eye exam: report requested from [facility] Normal foot exam today Negative UACr LDL at goal Repeat hgbA1c, and BMP Start Ozempic titrate from 0.25mg  to 0.5mg  in 2weeks. F/up in 3months      Relevant Medications   Semaglutide,0.25 or 0.5MG /DOS, (OZEMPIC, 0.25 OR 0.5 MG/DOSE,) 2 MG/3ML SOPN   Other Relevant Orders   Hemoglobin A1c   Hepatic function panel   Basic metabolic panel   HTN (hypertension), benign - Primary    Hx of white coat syndrome BP at goal BP Readings from Last 3 Encounters:  02/14/23 110/80  09/26/22 (!) 203/85  08/29/22 126/79    Maintain med doses Repeat BMP F/up in 3-40months      Relevant Orders   Basic metabolic panel   Hypothyroidism    Repeat TSH and T4 Maintain levothyroxine dose      Relevant Orders   T4, free   TSH   Other Visit Diagnoses     Colon cancer screening       Relevant Orders   Ambulatory referral to Gastroenterology      Return in about 3 months (around  05/17/2023) for DM.     Alysia Penna, NP

## 2023-02-14 NOTE — Patient Instructions (Signed)
Go to lab Continue Heart healthy diet and daily exercise. Maintain current medications. Start ozempic as prescribed

## 2023-02-14 NOTE — Assessment & Plan Note (Addendum)
Wegovy discontinued due to cost. She reports increase in weight and appetite despite diet modification under the guidance of nutritionist and daily exercise. She is interested in starting ozempic injection. She is aware of med side effects. No adverse effects during use of wegovy. Up to date with eye exam: report requested from [facility] Normal foot exam today Negative UACr LDL at goal Repeat hgbA1c, and BMP Start Ozempic titrate from 0.25mg  to 0.5mg  in 2weeks. F/up in 3months

## 2023-02-14 NOTE — Assessment & Plan Note (Signed)
Hx of white coat syndrome BP at goal BP Readings from Last 3 Encounters:  02/14/23 110/80  09/26/22 (!) 203/85  08/29/22 126/79    Maintain med doses Repeat BMP F/up in 3-50months

## 2023-02-14 NOTE — Assessment & Plan Note (Signed)
Repeat TSH and T4 Maintain levothyroxine dose 

## 2023-02-19 ENCOUNTER — Encounter: Payer: Self-pay | Admitting: Internal Medicine

## 2023-03-11 ENCOUNTER — Other Ambulatory Visit: Payer: Self-pay

## 2023-03-29 ENCOUNTER — Ambulatory Visit (AMBULATORY_SURGERY_CENTER): Payer: 59

## 2023-03-29 VITALS — Ht 62.0 in | Wt 173.0 lb

## 2023-03-29 DIAGNOSIS — Z1211 Encounter for screening for malignant neoplasm of colon: Secondary | ICD-10-CM

## 2023-03-29 MED ORDER — NA SULFATE-K SULFATE-MG SULF 17.5-3.13-1.6 GM/177ML PO SOLN
1.0000 | Freq: Once | ORAL | 0 refills | Status: AC
Start: 2023-03-29 — End: 2023-04-02
  Filled 2023-03-29: qty 354, 1d supply, fill #0

## 2023-03-29 NOTE — Progress Notes (Signed)
No egg or soy allergy known to patient  No issues known to pt with past sedation with any surgeries or procedures Patient denies ever being told they had issues or difficulty with intubation  No FH of Malignant Hyperthermia Pt is not on diet pills Pt is not on  home 02  Pt is not on blood thinners  Pt denies issues with constipation  No A fib or A flutter Have any cardiac testing pending--no  LOA: independent  Prep: suprep   Patient's chart reviewed by Shelly Cohen CNRA prior to previsit and patient appropriate for the [facility].  Previsit completed and red dot placed by patient's name on their procedure day (on provider's schedule).     PV competed with patient. Prep instructions sent via mychart and home address.

## 2023-03-30 ENCOUNTER — Other Ambulatory Visit (HOSPITAL_COMMUNITY): Payer: Self-pay

## 2023-04-04 DIAGNOSIS — R7303 Prediabetes: Secondary | ICD-10-CM | POA: Diagnosis not present

## 2023-04-04 DIAGNOSIS — Z9884 Bariatric surgery status: Secondary | ICD-10-CM | POA: Diagnosis not present

## 2023-04-04 DIAGNOSIS — E66811 Obesity, class 1: Secondary | ICD-10-CM | POA: Diagnosis not present

## 2023-04-07 ENCOUNTER — Encounter: Payer: Self-pay | Admitting: Certified Registered Nurse Anesthetist

## 2023-04-08 ENCOUNTER — Other Ambulatory Visit: Payer: Self-pay

## 2023-04-10 ENCOUNTER — Telehealth: Payer: Self-pay

## 2023-04-10 ENCOUNTER — Encounter: Payer: Self-pay | Admitting: Internal Medicine

## 2023-04-10 NOTE — Telephone Encounter (Signed)
Inbound call from patient, is worried about the prep process. States she had a gastric sleeve surgery and is not able to hold large amounts of liquids at one time. Please advise.

## 2023-04-10 NOTE — Telephone Encounter (Signed)
Please call patient. She has concerns about the volume of liquids she has to consume for her prep. She has had a gastric sleeve.

## 2023-04-11 NOTE — Telephone Encounter (Signed)
Spoke with patient all questions answered. Pt advised to start prep 30 min - 1 hr earlier to give herself time to complete fluid volume. Pt advised to drink at a slow and steady rate and if she needs to pause due to N/V that that is ok. Offered Zofran prior to patient declined. Other prep options were given but pt opted to just try with suprep.

## 2023-04-11 NOTE — Telephone Encounter (Signed)
Patient calling for an update on message sent yesterday. Please advise.

## 2023-04-12 ENCOUNTER — Encounter: Payer: Self-pay | Admitting: Internal Medicine

## 2023-04-12 ENCOUNTER — Other Ambulatory Visit (HOSPITAL_COMMUNITY): Payer: Self-pay

## 2023-04-12 ENCOUNTER — Other Ambulatory Visit: Payer: Self-pay

## 2023-04-12 ENCOUNTER — Ambulatory Visit: Payer: 59 | Admitting: Internal Medicine

## 2023-04-12 VITALS — BP 154/81 | HR 78 | Temp 98.1°F | Resp 12 | Ht 62.0 in | Wt 173.0 lb

## 2023-04-12 DIAGNOSIS — Z1211 Encounter for screening for malignant neoplasm of colon: Secondary | ICD-10-CM

## 2023-04-12 DIAGNOSIS — K635 Polyp of colon: Secondary | ICD-10-CM | POA: Diagnosis not present

## 2023-04-12 DIAGNOSIS — D122 Benign neoplasm of ascending colon: Secondary | ICD-10-CM | POA: Diagnosis not present

## 2023-04-12 DIAGNOSIS — E119 Type 2 diabetes mellitus without complications: Secondary | ICD-10-CM | POA: Diagnosis not present

## 2023-04-12 DIAGNOSIS — I10 Essential (primary) hypertension: Secondary | ICD-10-CM | POA: Diagnosis not present

## 2023-04-12 DIAGNOSIS — G4733 Obstructive sleep apnea (adult) (pediatric): Secondary | ICD-10-CM | POA: Diagnosis not present

## 2023-04-12 MED ORDER — SODIUM CHLORIDE 0.9 % IV SOLN
500.0000 mL | INTRAVENOUS | Status: DC
Start: 2023-04-12 — End: 2023-04-12

## 2023-04-12 NOTE — Op Note (Signed)
[facility] Patient Name: Shelly Cohen Procedure Date: 04/12/2023 10:22 AM MRN: [id number] Endoscopist: Madelyn Brunner Harrison , , [id number] Age: 62 Referring MD:  Date of Birth: October 29, 1960 Gender: Female Account #: [id number] Procedure:                Colonoscopy Indications:              Screening for colorectal malignant neoplasm Medicines:                Monitored Anesthesia Care Procedure:                Pre-Anesthesia Assessment:                           - Prior to the procedure, a History and Physical                            was performed, and patient medications and                            allergies were reviewed. The patient's tolerance of                            previous anesthesia was also reviewed. The risks                            and benefits of the procedure and the sedation                            options and risks were discussed with the patient.                            All questions were answered, and informed consent                            was obtained. Prior Anticoagulants: The patient has                            taken no anticoagulant or antiplatelet agents. ASA                            Grade Assessment: II - A patient with mild systemic                            disease. After reviewing the risks and benefits,                            the patient was deemed in satisfactory condition to                            undergo the procedure.                           After obtaining informed consent, the colonoscope  was passed under direct vision. Throughout the                            procedure, the patient's blood pressure, pulse, and                            oxygen saturations were monitored continuously. The                            Olympus Scope SN: [id number] was introduced through                            the anus and advanced to the the terminal ileum.                            The  colonoscopy was performed without difficulty.                            The patient tolerated the procedure well. The                            quality of the bowel preparation was excellent. The                            terminal ileum, ileocecal valve, appendiceal                            orifice, and rectum were photographed. Scope In: 10:26:48 AM Scope Out: 10:40:14 AM Scope Withdrawal Time: 0 hours 10 minutes 17 seconds  Total Procedure Duration: 0 hours 13 minutes 26 seconds  Findings:                 The terminal ileum appeared normal.                           A 4 mm polyp was found in the ascending colon. The                            polyp was sessile. The polyp was removed with a                            cold snare. Resection and retrieval were complete.                           Non-bleeding internal hemorrhoids were found during                            retroflexion. Complications:            No immediate complications. Estimated Blood Loss:     Estimated blood loss was minimal. Impression:               - The examined portion of the ileum was normal.                           -  One 4 mm polyp in the ascending colon, removed                            with a cold snare. Resected and retrieved.                           - Non-bleeding internal hemorrhoids. Recommendation:           - Discharge patient to home (with escort).                           - Await pathology results.                           - The findings and recommendations were discussed                            with the patient. Dr Particia Lather "Alan Ripper" Leonides Schanz,  04/12/2023 10:44:15 AM

## 2023-04-12 NOTE — Patient Instructions (Signed)
Handout given on polyps.  YOU HAD AN ENDOSCOPIC PROCEDURE TODAY AT [facility]:   Refer to the procedure report that was given to you for any specific questions about what was found during the examination.  If the procedure report does not answer your questions, please call your gastroenterologist to clarify.  If you requested that your care partner not be given the details of your procedure findings, then the procedure report has been included in a sealed envelope for you to review at your convenience later.  YOU SHOULD EXPECT: Some feelings of bloating in the abdomen. Passage of more gas than usual.  Walking can help get rid of the air that was put into your GI tract during the procedure and reduce the bloating. If you had a lower endoscopy (such as a colonoscopy or flexible sigmoidoscopy) you may notice spotting of blood in your stool or on the toilet paper. If you underwent a bowel prep for your procedure, you may not have a normal bowel movement for a few days.  Please Note:  You might notice some irritation and congestion in your nose or some drainage.  This is from the oxygen used during your procedure.  There is no need for concern and it should clear up in a day or so.  SYMPTOMS TO REPORT IMMEDIATELY:  Following lower endoscopy (colonoscopy or flexible sigmoidoscopy):  Excessive amounts of blood in the stool  Significant tenderness or worsening of abdominal pains  Swelling of the abdomen that is new, acute  Fever of 100F or higher  Following upper endoscopy (EGD)  Vomiting of blood or coffee ground material  New chest pain or pain under the shoulder blades  Painful or persistently difficult swallowing  New shortness of breath  Fever of 100F or higher  Black, tarry-looking stools  For urgent or emergent issues, a gastroenterologist can be reached at any hour by calling [phone]. Do not use MyChart messaging for urgent concerns.    DIET:  We do recommend a  small meal at first, but then you may proceed to your regular diet.  Drink plenty of fluids but you should avoid alcoholic beverages for 24 hours.  ACTIVITY:  You should plan to take it easy for the rest of today and you should NOT DRIVE or use heavy machinery until tomorrow (because of the sedation medicines used during the test).    FOLLOW UP: Our staff will call the number listed on your records the next business day following your procedure.  We will call around 7:15- 8:00 am to check on you and address any questions or concerns that you may have regarding the information given to you following your procedure. If we do not reach you, we will leave a message.     If any biopsies were taken you will be contacted by phone or by letter within the next 1-3 weeks.  Please call us at [phone] if you have not heard about the biopsies in 3 weeks.    SIGNATURES/CONFIDENTIALITY: You and/or your care partner have signed paperwork which will be entered into your electronic medical record.  These signatures attest to the fact that that the information above on your After Visit Summary has been reviewed and is understood.  Full responsibility of the confidentiality of this discharge information lies with you and/or your care-partner.

## 2023-04-12 NOTE — Progress Notes (Signed)
Called to room to assist during endoscopic procedure.  Patient ID and intended procedure confirmed with present staff. Received instructions for my participation in the procedure from the performing physician.  

## 2023-04-12 NOTE — Progress Notes (Signed)
GASTROENTEROLOGY PROCEDURE H&P NOTE   Primary Care Physician: Anne Ng, NP    Reason for Procedure:   Colon cancer screening  Plan:    Colonoscopy  Patient is appropriate for endoscopic procedure(s) in the ambulatory ([facility]) setting.  The nature of the procedure, as well as the risks, benefits, and alternatives were carefully and thoroughly reviewed with the patient. Ample time for discussion and questions allowed. The patient understood, was satisfied, and agreed to proceed.     HPI: Shelly Cohen is a 62 y.o. female who presents for colonoscopy for colon cancer screening. Denies blood in stools, changes in bowel habits, or unintentional weight loss. Denies family history of colon cancer. Last colonoscopy was in 2014 and was normal.  Past Medical History:  Diagnosis Date   Alpha thalassemia trait    Arthritis    hips   Fibroadenoma    Glaucoma    monitored every 6months, right eye worse than left    History of positive PPD, treatment status unknown    Hypertension    OSA (obstructive sleep apnea)    mild with AHI 8/hr   Pre-diabetes    Thyroid disease     Past Surgical History:  Procedure Laterality Date   ABDOMINAL HYSTERECTOMY  2013   BREAST BIOPSY  2013   BUNIONECTOMY  10/2014   CHOLECYSTECTOMY  2015   COLONOSCOPY     hallux limi Right 2019   great  toe revision   TOTAL HIP ARTHROPLASTY Right 09/12/2018   Procedure: RIGHT TOTAL HIP ARTHROPLASTY ANTERIOR APPROACH;  Surgeon: Kathryne Hitch, MD;  Location: [facility];  Service: Orthopedics;  Laterality: Right;   TUBAL LIGATION  1990   UPPER GI ENDOSCOPY N/A 02/20/2021   Procedure: UPPER GI ENDOSCOPY;  Surgeon: Gaynelle Adu, MD;  Location: [facility];  Service: General;  Laterality: N/A;    Prior to Admission medications   Medication Sig Start Date End Date Taking? Authorizing Provider  albuterol (VENTOLIN HFA) 108 (90 Base) MCG/ACT inhaler Inhale 1-2 puffs into the lungs every 6 (six) hours as  needed for wheezing or shortness of breath. 06/07/22   Nche, Bonna Gains, NP  amLODipine (NORVASC) 5 MG tablet Take 1 tablet (5 mg total) by mouth every evening. 01/17/23   Nche, Bonna Gains, NP  BIOTIN PO Take 1 tablet by mouth daily.    [provider]  CALCIUM-VITAMIN D PO Take 1 tablet by mouth daily.    [provider]  carvedilol (COREG) 3.125 MG tablet Take 1 tablet (3.125 mg total) by mouth 2 (two) times daily. 08/20/22   Nche, Bonna Gains, NP  diclofenac (VOLTAREN) 75 MG EC tablet Take 1 tablet twice daily with food for 5 days. Then take as needed. Patient not taking: Reported on 03/29/2023 08/16/22   Ralene Cork, DO  docusate sodium (COLACE) 100 MG capsule Take 100 mg by mouth every other day. Patient not taking: Reported on 03/29/2023    [provider]  fluticasone (FLONASE) 50 MCG/ACT nasal spray Place 1 spray into both nostrils daily as needed for allergies or rhinitis. Patient not taking: Reported on 03/29/2023    [provider]  gabapentin (NEURONTIN) 100 MG capsule Take 1 capsule (100 mg total) by mouth 3 (three) times daily. 04/26/22   Nche, Bonna Gains, NP  ipratropium (ATROVENT) 0.03 % nasal spray Place 2 sprays into the nose 2 (two) times daily as needed. Patient not taking: Reported on 03/29/2023 07/04/21   [provider]  KRILL  OIL PO Take 1 capsule by mouth daily.    [provider]  latanoprost (XALATAN) 0.005 % ophthalmic solution Place 1 drop into both eyes every evening. 10/26/22     levothyroxine (SYNTHROID) 50 MCG tablet Take 1 tablet (50 mcg total) by mouth daily before breakfast. 02/04/23   Nche, Bonna Gains, NP  MELATONIN PO Take 1 tablet by mouth at bedtime.    [provider]  Semaglutide,0.25 or 0.5MG /DOS, (OZEMPIC, 0.25 OR 0.5 MG/DOSE,) 2 MG/3ML SOPN Inject 0.25mg  into the skin weekly for 2 weeks then 0.5 mg into the skin every 7 (seven) days. 02/14/23   Nche, Bonna Gains, NP    Current  Outpatient Medications  Medication Sig Dispense Refill   albuterol (VENTOLIN HFA) 108 (90 Base) MCG/ACT inhaler Inhale 1-2 puffs into the lungs every 6 (six) hours as needed for wheezing or shortness of breath. 6.7 g 0   amLODipine (NORVASC) 5 MG tablet Take 1 tablet (5 mg total) by mouth every evening. 90 tablet 1   BIOTIN PO Take 1 tablet by mouth daily.     CALCIUM-VITAMIN D PO Take 1 tablet by mouth daily.     carvedilol (COREG) 3.125 MG tablet Take 1 tablet (3.125 mg total) by mouth 2 (two) times daily. 180 tablet 3   diclofenac (VOLTAREN) 75 MG EC tablet Take 1 tablet twice daily with food for 5 days. Then take as needed. (Patient not taking: Reported on 03/29/2023) 60 tablet 0   docusate sodium (COLACE) 100 MG capsule Take 100 mg by mouth every other day. (Patient not taking: Reported on 03/29/2023)     fluticasone (FLONASE) 50 MCG/ACT nasal spray Place 1 spray into both nostrils daily as needed for allergies or rhinitis. (Patient not taking: Reported on 03/29/2023)     gabapentin (NEURONTIN) 100 MG capsule Take 1 capsule (100 mg total) by mouth 3 (three) times daily. 270 capsule 3   ipratropium (ATROVENT) 0.03 % nasal spray Place 2 sprays into the nose 2 (two) times daily as needed. (Patient not taking: Reported on 03/29/2023)     KRILL OIL PO Take 1 capsule by mouth daily.     latanoprost (XALATAN) 0.005 % ophthalmic solution Place 1 drop into both eyes every evening. 5 mL 6   levothyroxine (SYNTHROID) 50 MCG tablet Take 1 tablet (50 mcg total) by mouth daily before breakfast. 90 tablet 1   MELATONIN PO Take 1 tablet by mouth at bedtime.     Semaglutide,0.25 or 0.5MG /DOS, (OZEMPIC, 0.25 OR 0.5 MG/DOSE,) 2 MG/3ML SOPN Inject 0.25mg  into the skin weekly for 2 weeks then 0.5 mg into the skin every 7 (seven) days. 3 mL 2   Current Facility-Administered Medications  Medication Dose Route Frequency Provider Last Rate Last Admin   0.9 %  sodium chloride infusion  500 mL Intravenous Continuous  Imogene Burn, MD        Allergies as of 04/12/2023 - Review Complete 04/12/2023  Allergen Reaction Noted   Dilaudid [hydromorphone hcl] Itching 08/31/2020   Diphenhydramine hcl Itching 04/01/2016   Hydrocodone Nausea Only 04/01/2016   Oxycodone Nausea And Vomiting 09/12/2018   Azithromycin Itching and Rash 04/01/2016   Cephalexin Rash 04/01/2016   Penicillin g Rash 04/01/2016   Zolpidem Itching and Rash 04/01/2016    Family History  Problem Relation Age of Onset   Hypertension Mother    Anemia Mother    Arthritis Mother        rheumatoid arthritis   Kidney disease Mother  Cataracts Mother    Heart disease Mother    Colon polyps Sister    Hypertension Sister    Cataracts Sister    Colon polyps Brother    Hypertension Brother    Glaucoma Brother    Diabetes Brother    Glaucoma Maternal Aunt    Cancer Maternal Grandmother        leukemia   Cancer Maternal Grandfather        lung   Colon cancer Neg Hx    Esophageal cancer Neg Hx    Rectal cancer Neg Hx    Stomach cancer Neg Hx     Social History   Socioeconomic History   Marital status: Married    Spouse name: Not on file   Number of children: Not on file   Years of education: Not on file   Highest education level: Associate degree: academic program  Occupational History   Not on file  Tobacco Use   Smoking status: Never   Smokeless tobacco: Never  Vaping Use   Vaping status: Never Used  Substance and Sexual Activity   Alcohol use: No   Drug use: No   Sexual activity: Yes    Birth control/protection: Surgical  Other Topics Concern   Not on file  Social History Narrative   Not on file   Social Determinants of Health   Financial Resource Strain: Low Risk  (02/13/2023)   Overall Financial Resource Strain (CARDIA)    Difficulty of Paying Living Expenses: Not hard at all  Food Insecurity: No Food Insecurity (02/13/2023)   Hunger Vital Sign    Worried About Running Out of Food in the Last Year: Never  true    Ran Out of Food in the Last Year: Never true  Transportation Needs: No Transportation Needs (02/13/2023)   PRAPARE - Administrator, Civil Service (Medical): No    Lack of Transportation (Non-Medical): No  Physical Activity: Insufficiently Active (02/13/2023)   Exercise Vital Sign    Days of Exercise per Week: 3 days    Minutes of Exercise per Session: 30 min  Stress: No Stress Concern Present (02/13/2023)   [employer] of Occupational Health - Occupational Stress Questionnaire    Feeling of Stress : Not at all  Social Connections: Socially Integrated (02/13/2023)   Social Connection and Isolation Panel [NHANES]    Frequency of Communication with Friends and Family: More than three times a week    Frequency of Social Gatherings with Friends and Family: Twice a week    Attends Religious Services: More than 4 times per year    Active Member of [employer] or Organizations: Yes    Attends Engineer, structural: More than 4 times per year    Marital Status: Married  Catering manager Violence: Not on file    Physical Exam: Vital signs in last 24 hours: BP (!) 153/82   Pulse 87   Temp 98.1 F (36.7 C) (Temporal)   Ht 5\' 2"  (1.575 m)   Wt 173 lb (78.5 kg)   SpO2 98%   BMI 31.64 kg/m  GEN: NAD EYE: Sclerae anicteric ENT: MMM CV: Non-tachycardic Pulm: No increased work of breathing GI: Soft, NT/ND NEURO:  Alert & Oriented   Eulah Pont, MD [facility]  04/12/2023 10:12 AM

## 2023-04-12 NOTE — Progress Notes (Signed)
Pt's states no medical or surgical changes since previsit or office visit. 

## 2023-04-12 NOTE — Progress Notes (Signed)
Report given to PACU, vss 

## 2023-04-15 ENCOUNTER — Telehealth: Payer: Self-pay

## 2023-04-15 NOTE — Telephone Encounter (Signed)
  Follow up Call-     04/12/2023   10:09 AM  Call back number  Post procedure Call Back phone  # [phone]  Permission to leave phone message Yes     Patient questions:  Do you have a fever, pain , or abdominal swelling? No. Pain Score  0 *  Have you tolerated food without any problems? Yes.    Have you been able to return to your normal activities? Yes.    Do you have any questions about your discharge instructions: Diet   No. Medications  No. Follow up visit  No.  Do you have questions or concerns about your Care? No.  Actions: * If pain score is 4 or above: No action needed, pain <4.

## 2023-04-16 ENCOUNTER — Encounter: Payer: Self-pay | Admitting: Internal Medicine

## 2023-04-16 LAB — SURGICAL PATHOLOGY

## 2023-04-17 ENCOUNTER — Other Ambulatory Visit: Payer: Self-pay

## 2023-04-17 ENCOUNTER — Other Ambulatory Visit: Payer: Self-pay | Admitting: Nurse Practitioner

## 2023-04-17 DIAGNOSIS — R200 Anesthesia of skin: Secondary | ICD-10-CM

## 2023-04-17 MED ORDER — GABAPENTIN 100 MG PO CAPS
100.0000 mg | ORAL_CAPSULE | Freq: Three times a day (TID) | ORAL | 3 refills | Status: DC
Start: 2023-04-17 — End: 2023-10-03
  Filled 2023-04-17: qty 270, 90d supply, fill #0
  Filled 2023-07-18: qty 270, 90d supply, fill #1

## 2023-04-22 ENCOUNTER — Encounter: Payer: Self-pay | Admitting: Nurse Practitioner

## 2023-04-23 DIAGNOSIS — H401121 Primary open-angle glaucoma, left eye, mild stage: Secondary | ICD-10-CM | POA: Diagnosis not present

## 2023-04-23 DIAGNOSIS — H40011 Open angle with borderline findings, low risk, right eye: Secondary | ICD-10-CM | POA: Diagnosis not present

## 2023-05-06 ENCOUNTER — Other Ambulatory Visit: Payer: Self-pay

## 2023-05-11 ENCOUNTER — Other Ambulatory Visit: Payer: Self-pay | Admitting: Nurse Practitioner

## 2023-05-11 DIAGNOSIS — E1165 Type 2 diabetes mellitus with hyperglycemia: Secondary | ICD-10-CM

## 2023-05-13 ENCOUNTER — Other Ambulatory Visit (HOSPITAL_COMMUNITY): Payer: Self-pay

## 2023-05-13 ENCOUNTER — Other Ambulatory Visit: Payer: Self-pay

## 2023-05-14 ENCOUNTER — Other Ambulatory Visit (HOSPITAL_COMMUNITY): Payer: Self-pay

## 2023-05-14 ENCOUNTER — Other Ambulatory Visit: Payer: Self-pay

## 2023-05-14 ENCOUNTER — Encounter: Payer: Self-pay | Admitting: Nurse Practitioner

## 2023-05-14 ENCOUNTER — Telehealth (INDEPENDENT_AMBULATORY_CARE_PROVIDER_SITE_OTHER): Payer: 59 | Admitting: Nurse Practitioner

## 2023-05-14 VITALS — BP 134/76 | HR 85 | Temp 99.0°F

## 2023-05-14 DIAGNOSIS — J209 Acute bronchitis, unspecified: Secondary | ICD-10-CM

## 2023-05-14 DIAGNOSIS — J069 Acute upper respiratory infection, unspecified: Secondary | ICD-10-CM | POA: Diagnosis not present

## 2023-05-14 DIAGNOSIS — Z90710 Acquired absence of both cervix and uterus: Secondary | ICD-10-CM | POA: Insufficient documentation

## 2023-05-14 LAB — POCT INFLUENZA A/B
Influenza A, POC: NEGATIVE
Influenza B, POC: NEGATIVE

## 2023-05-14 MED ORDER — PROMETHAZINE-DM 6.25-15 MG/5ML PO SYRP
5.0000 mL | ORAL_SOLUTION | Freq: Three times a day (TID) | ORAL | 0 refills | Status: DC | PRN
Start: 2023-05-14 — End: 2023-08-27
  Filled 2023-05-14: qty 180, 12d supply, fill #0

## 2023-05-14 MED ORDER — METHYLPREDNISOLONE 4 MG PO TBPK
ORAL_TABLET | ORAL | 0 refills | Status: DC
Start: 2023-05-14 — End: 2023-05-20
  Filled 2023-05-14: qty 21, 6d supply, fill #0

## 2023-05-14 NOTE — Patient Instructions (Signed)
URI Instructions: Avoid decongestants if you have high blood pressure. Encourage adequate oral hydration. Use mucinex DM or Robitussin  or delsym for cough without congestion  You can use plain "Tylenol" or "Advil" for fever, chills and achyness. Use cool mist humidifier at bedtime to help with nasal congestion and cough.  Cold/cough medications may have tylenol or ibuprofen or guaifenesin or dextromethophan in them, so be careful not to take beyond the recommended dose for each of these medications.   "Common cold" symptoms are usually triggered by a virus.  The antibiotics are usually not necessary. On average, a" viral cold" illness may take 7-10 days to resolve. Please, make an appointment if you are not better or if you're worse.

## 2023-05-14 NOTE — Progress Notes (Signed)
Virtual Visit via Video Note  I connected withNAME@ on 05/14/23 at 11:00 AM EST by a video enabled telemedicine application and verified that I am speaking with the correct person using two identifiers.  Location: Patient:Home Provider: Office Participants: patient and provider  I discussed the limitations of evaluation and management by telemedicine and the availability of in person appointments. I also discussed with the patient that there may be a patient responsible charge related to this service. The patient expressed understanding and agreed to proceed.  CC: PT C/O of cough, low grade fever and sore throat 4 days symptoms started Friday. Patient used OTC cough and cold, and albuterol for symptoms. PT was coughing up green phlegm yesterday but voiced it is mainly dry today with decrease in appetite and feeling fatigue     History of Present Illness: URI  This is a new problem. The current episode started in the past 7 days. The problem has been unchanged. The maximum temperature recorded prior to her arrival was 100.4 - 100.9 F. The fever has been present for 1 to 2 days. Associated symptoms include congestion, coughing, headaches, joint pain, rhinorrhea, sinus pain and wheezing. Pertinent negatives include no abdominal pain, chest pain, diarrhea, dysuria, ear pain, joint swelling, nausea, neck pain, plugged ear sensation, rash, sneezing, sore throat, swollen glands or vomiting. She has tried antihistamine, acetaminophen and inhaler use for the symptoms. The treatment provided mild relief.   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  Cardiovascular:     Rate and Rhythm: Normal rate.     Pulses: Normal pulses.  Pulmonary:     Effort: Pulmonary effort is normal.  Neurological:     Mental Status: She is alert and  oriented to person, place, and time.     Assessment and Plan: Leilanee was seen today for acute visit  and video visit .  Diagnoses and all orders for this visit:  Viral URI with cough -     POCT Influenza A/B -     promethazine-dextromethorphan (PROMETHAZINE-DM) 6.25-15 MG/5ML syrup; Take 5 mLs by mouth 3 (three) times daily as needed for cough. -     methylPREDNISolone (MEDROL DOSEPAK) 4 MG TBPK tablet; Take as directed on package  Acute bronchitis, unspecified organism -     POCT Influenza A/B -     promethazine-dextromethorphan (PROMETHAZINE-DM) 6.25-15 MG/5ML syrup; Take 5 mLs by mouth 3 (three) times daily as needed for cough. -     methylPREDNISolone (MEDROL DOSEPAK) 4 MG TBPK tablet; Take as directed on package   Follow Up Instructions: Negative flu and COVID Avoid decongestants if you have high blood pressure. Encourage adequate oral hydration. Use mucinex DM or Robitussin  or delsym for cough without congestion  You can use plain "Tylenol" or "Advil" for fever, chills and achyness. Use cool mist humidifier at bedtime to help with nasal congestion and cough. Cold/cough medications may have tylenol or ibuprofen or guaifenesin or dextromethophan in them, so be careful not to take beyond the recommended dose for each of these medications.   I discussed the assessment and treatment plan with the patient. The patient was provided an opportunity to ask questions and all were answered. The patient agreed with the plan and demonstrated an understanding of the instructions.   The patient was advised to call back or seek an in-person evaluation if the symptoms worsen or if  the condition fails to improve as anticipated.  Alysia Penna, NP

## 2023-05-15 ENCOUNTER — Other Ambulatory Visit (HOSPITAL_COMMUNITY): Payer: Self-pay

## 2023-05-15 MED ORDER — OZEMPIC (0.25 OR 0.5 MG/DOSE) 2 MG/3ML ~~LOC~~ SOPN
0.5000 mg | PEN_INJECTOR | SUBCUTANEOUS | 2 refills | Status: DC
Start: 2023-05-15 — End: 2023-08-04
  Filled 2023-05-15: qty 3, 28d supply, fill #0
  Filled 2023-06-08: qty 3, 28d supply, fill #1
  Filled 2023-07-06: qty 3, 28d supply, fill #2

## 2023-05-16 ENCOUNTER — Encounter: Payer: Self-pay | Admitting: Nurse Practitioner

## 2023-05-20 ENCOUNTER — Ambulatory Visit (INDEPENDENT_AMBULATORY_CARE_PROVIDER_SITE_OTHER)
Admission: RE | Admit: 2023-05-20 | Discharge: 2023-05-20 | Disposition: A | Discharge status: Home / Self Care | Payer: 59 | Source: Ambulatory Visit | Attending: Nurse Practitioner | Admitting: Nurse Practitioner

## 2023-05-20 ENCOUNTER — Ambulatory Visit (INDEPENDENT_AMBULATORY_CARE_PROVIDER_SITE_OTHER): Payer: 59 | Admitting: Nurse Practitioner

## 2023-05-20 ENCOUNTER — Other Ambulatory Visit (HOSPITAL_COMMUNITY): Payer: Self-pay

## 2023-05-20 ENCOUNTER — Encounter: Payer: Self-pay | Admitting: Nurse Practitioner

## 2023-05-20 ENCOUNTER — Other Ambulatory Visit: Payer: Self-pay

## 2023-05-20 VITALS — BP 180/90 | HR 87 | Temp 98.3°F | Resp 18 | Wt 181.6 lb

## 2023-05-20 DIAGNOSIS — E039 Hypothyroidism, unspecified: Secondary | ICD-10-CM | POA: Diagnosis not present

## 2023-05-20 DIAGNOSIS — I10 Essential (primary) hypertension: Secondary | ICD-10-CM | POA: Diagnosis not present

## 2023-05-20 DIAGNOSIS — J209 Acute bronchitis, unspecified: Secondary | ICD-10-CM | POA: Diagnosis not present

## 2023-05-20 DIAGNOSIS — R0602 Shortness of breath: Secondary | ICD-10-CM | POA: Diagnosis not present

## 2023-05-20 MED ORDER — BUDESONIDE-FORMOTEROL FUMARATE 160-4.5 MCG/ACT IN AERO
1.0000 | INHALATION_SPRAY | Freq: Two times a day (BID) | RESPIRATORY_TRACT | 0 refills | Status: DC
Start: 2023-05-20 — End: 2023-08-27
  Filled 2023-05-20: qty 10.2, 30d supply, fill #0

## 2023-05-20 MED ORDER — AMLODIPINE BESYLATE 5 MG PO TABS
5.0000 mg | ORAL_TABLET | Freq: Every evening | ORAL | 3 refills | Status: DC
Start: 2023-05-20 — End: 2023-05-24
  Filled 2023-05-20: qty 90, 90d supply, fill #0

## 2023-05-20 MED ORDER — CARVEDILOL 3.125 MG PO TABS
3.1250 mg | ORAL_TABLET | Freq: Two times a day (BID) | ORAL | 3 refills | Status: DC
Start: 2023-05-20 — End: 2024-02-25
  Filled 2023-05-20 – 2023-08-12 (×2): qty 180, 90d supply, fill #0
  Filled 2023-11-06: qty 180, 90d supply, fill #1
  Filled 2024-02-14: qty 180, 90d supply, fill #2

## 2023-05-20 MED ORDER — LEVOTHYROXINE SODIUM 50 MCG PO TABS
50.0000 ug | ORAL_TABLET | Freq: Every day | ORAL | 1 refills | Status: DC
Start: 2023-05-20 — End: 2024-01-28
  Filled 2023-05-20 – 2023-07-30 (×2): qty 90, 90d supply, fill #0
  Filled 2023-11-06: qty 90, 90d supply, fill #1

## 2023-05-20 NOTE — Assessment & Plan Note (Signed)
Hx of white coat syndrome Reports she is compliance with med doses and DASH diet BP Readings from Last 3 Encounters:  05/20/23 (!) 180/90  05/14/23 134/76  04/12/23 (!) 154/81    Maintain med doses Advised to send home BP readings via mychart in 1week. F/up in 3months

## 2023-05-20 NOTE — Patient Instructions (Signed)
Go to [address] for CXR Start symbicort inhaler Alternate between mucinex Dm and promethazine Dm every 4hrs as needed for cough Stop coricidin Maintain adequate oral hydration Monitor BP daily in AM. Send BP reading via mychart on Friday.

## 2023-05-20 NOTE — Progress Notes (Signed)
Established Patient Visit  Patient: Shelly Cohen   DOB: 1960-10-23   62 y.o. Female  MRN: [id number] Visit Date: 05/20/2023  Subjective:    Chief Complaint  Patient presents with   OFFICE VISIT     3 month follow up. PT C/O of URI symptoms since last visit with cough   Diabetes   Cough   Cough This is a new problem. The current episode started 1 to 4 weeks ago. The problem has been unchanged. The problem occurs constantly. The cough is Non-productive. Associated symptoms include chest pain, chills, postnasal drip, shortness of breath and wheezing. Pertinent negatives include no ear congestion, ear pain, fever, headaches, heartburn, hemoptysis, myalgias, nasal congestion, rash, rhinorrhea, sore throat, sweats or weight loss. The symptoms are aggravated by lying down. She has tried a beta-agonist inhaler, OTC cough suppressant, prescription cough suppressant and oral steroids for the symptoms. The treatment provided mild relief. Her past medical history is significant for bronchitis and environmental allergies.  She had similar symptoms 06/2022 which lasted about 2weeks: CXR was normal at the time. Treated with levaquin, prednisone, promethazine DM and albuterol at the time.  HTN (hypertension), benign Hx of white coat syndrome Reports she is compliance with med doses and DASH diet BP Readings from Last 3 Encounters:  05/20/23 (!) 180/90  05/14/23 134/76  04/12/23 (!) 154/81    Maintain med doses Advised to send home BP readings via mychart in 1week. F/up in 3months   Reviewed medical, surgical, and social history today  Medications: Outpatient Medications Prior to Visit  Medication Sig   albuterol (VENTOLIN HFA) 108 (90 Base) MCG/ACT inhaler Inhale 1-2 puffs into the lungs every 6 (six) hours as needed for wheezing or shortness of breath.   BIOTIN PO Take 1 tablet by mouth daily.   CALCIUM-VITAMIN D PO Take 1 tablet by mouth daily.   docusate sodium (COLACE)  100 MG capsule Take 100 mg by mouth every other day.   fluticasone (FLONASE) 50 MCG/ACT nasal spray Place 1 spray into both nostrils daily as needed for allergies or rhinitis.   gabapentin (NEURONTIN) 100 MG capsule Take 1 capsule (100 mg total) by mouth 3 (three) times daily.   latanoprost (XALATAN) 0.005 % ophthalmic solution Place 1 drop into both eyes every evening.   MELATONIN PO Take 1 tablet by mouth at bedtime.   promethazine-dextromethorphan (PROMETHAZINE-DM) 6.25-15 MG/5ML syrup Take 5 mLs by mouth 3 (three) times daily as needed for cough.   Semaglutide,0.25 or 0.5MG /DOS, (OZEMPIC, 0.25 OR 0.5 MG/DOSE,) 2 MG/3ML SOPN Inject 0.25mg  into the skin weekly for 2 weeks then 0.5 mg into the skin every 7 (seven) days.   [DISCONTINUED] amLODipine (NORVASC) 5 MG tablet Take 1 tablet (5 mg total) by mouth every evening.   [DISCONTINUED] carvedilol (COREG) 3.125 MG tablet Take 1 tablet (3.125 mg total) by mouth 2 (two) times daily.   [DISCONTINUED] levothyroxine (SYNTHROID) 50 MCG tablet Take 1 tablet (50 mcg total) by mouth daily before breakfast.   [DISCONTINUED] methylPREDNISolone (MEDROL DOSEPAK) 4 MG TBPK tablet Take as directed on package   No facility-administered medications prior to visit.   Reviewed past medical and social history.   ROS per HPI above      Objective:  BP (!) 180/90 (BP Location: Right Arm, Patient Position: Sitting, Cuff Size: Large) Comment: recheck BP reading  Pulse 87   Temp 98.3 F (36.8 C) (Temporal)   Resp  18   Wt 181 lb 9.6 oz (82.4 kg)   SpO2 99%   BMI 33.22 kg/m      Physical Exam Vitals and nursing note reviewed.  Cardiovascular:     Rate and Rhythm: Normal rate and regular rhythm.     Pulses: Normal pulses.     Heart sounds: Normal heart sounds.  Pulmonary:     Effort: Pulmonary effort is normal.     Breath sounds: Normal breath sounds.  Musculoskeletal:     Right lower leg: No edema.     Left lower leg: No edema.  Neurological:      Mental Status: She is alert and oriented to person, place, and time.     No results found for any visits on 05/20/23.    Assessment & Plan:    Problem List Items Addressed This Visit     HTN (hypertension), benign - Primary    Hx of white coat syndrome Reports she is compliance with med doses and DASH diet BP Readings from Last 3 Encounters:  05/20/23 (!) 180/90  05/14/23 134/76  04/12/23 (!) 154/81    Maintain med doses Advised to send home BP readings via mychart in 1week. F/up in 3months      Relevant Medications   amLODipine (NORVASC) 5 MG tablet   carvedilol (COREG) 3.125 MG tablet   Hypothyroidism   Relevant Medications   carvedilol (COREG) 3.125 MG tablet   levothyroxine (SYNTHROID) 50 MCG tablet   White coat syndrome with diagnosis of hypertension   Relevant Medications   amLODipine (NORVASC) 5 MG tablet   carvedilol (COREG) 3.125 MG tablet   Other Visit Diagnoses     Acute bronchitis, unspecified organism       Relevant Medications   budesonide-formoterol (SYMBICORT) 160-4.5 MCG/ACT inhaler   Other Relevant Orders   DG Chest 2 View     Possibly cough variant asthma? Consider Spirometry and/or referral to ENT if normal CXR and no improvement with symbicort.  Return in about 3 months (around 08/20/2023) for HTN, DM, hyperlipidemia (fasting).     Alysia Penna, NP

## 2023-05-24 ENCOUNTER — Encounter: Payer: Self-pay | Admitting: Nurse Practitioner

## 2023-05-24 ENCOUNTER — Other Ambulatory Visit: Payer: Self-pay

## 2023-05-24 DIAGNOSIS — I10 Essential (primary) hypertension: Secondary | ICD-10-CM

## 2023-05-24 MED ORDER — AMLODIPINE BESYLATE 10 MG PO TABS
10.0000 mg | ORAL_TABLET | Freq: Every evening | ORAL | 1 refills | Status: DC
  Filled 2023-05-24: qty 90, 90d supply, fill #0
  Filled 2023-08-17: qty 90, 90d supply, fill #1

## 2023-05-24 NOTE — Assessment & Plan Note (Signed)
Increased amlodipine to 10mg  in PM

## 2023-06-10 ENCOUNTER — Other Ambulatory Visit: Payer: Self-pay

## 2023-06-14 ENCOUNTER — Encounter: Payer: Self-pay | Admitting: Nurse Practitioner

## 2023-07-06 ENCOUNTER — Other Ambulatory Visit (HOSPITAL_COMMUNITY): Payer: Self-pay

## 2023-07-19 ENCOUNTER — Other Ambulatory Visit: Payer: Self-pay

## 2023-07-30 ENCOUNTER — Other Ambulatory Visit: Payer: Self-pay

## 2023-07-30 ENCOUNTER — Other Ambulatory Visit (HOSPITAL_COMMUNITY): Payer: Self-pay

## 2023-08-04 ENCOUNTER — Other Ambulatory Visit: Payer: Self-pay | Admitting: Nurse Practitioner

## 2023-08-04 DIAGNOSIS — E1165 Type 2 diabetes mellitus with hyperglycemia: Secondary | ICD-10-CM

## 2023-08-06 ENCOUNTER — Other Ambulatory Visit (HOSPITAL_COMMUNITY): Payer: Self-pay

## 2023-08-06 MED ORDER — OZEMPIC (0.25 OR 0.5 MG/DOSE) 2 MG/3ML ~~LOC~~ SOPN
0.5000 mg | PEN_INJECTOR | SUBCUTANEOUS | 0 refills | Status: DC
  Filled 2023-08-06: qty 3, 28d supply, fill #0

## 2023-08-12 ENCOUNTER — Other Ambulatory Visit: Payer: Self-pay

## 2023-08-12 ENCOUNTER — Other Ambulatory Visit (HOSPITAL_COMMUNITY): Payer: Self-pay

## 2023-08-17 ENCOUNTER — Other Ambulatory Visit: Payer: Self-pay

## 2023-08-21 ENCOUNTER — Ambulatory Visit: Payer: 59 | Admitting: Nurse Practitioner

## 2023-08-27 ENCOUNTER — Encounter: Payer: Self-pay | Admitting: Nurse Practitioner

## 2023-08-27 ENCOUNTER — Other Ambulatory Visit: Payer: Self-pay

## 2023-08-27 ENCOUNTER — Ambulatory Visit (INDEPENDENT_AMBULATORY_CARE_PROVIDER_SITE_OTHER): Payer: Commercial Managed Care - PPO | Admitting: Nurse Practitioner

## 2023-08-27 ENCOUNTER — Other Ambulatory Visit (HOSPITAL_COMMUNITY): Payer: Self-pay

## 2023-08-27 VITALS — BP 121/80 | HR 82 | Temp 98.0°F | Ht 62.0 in | Wt 178.4 lb

## 2023-08-27 DIAGNOSIS — Z7985 Long-term (current) use of injectable non-insulin antidiabetic drugs: Secondary | ICD-10-CM

## 2023-08-27 DIAGNOSIS — D500 Iron deficiency anemia secondary to blood loss (chronic): Secondary | ICD-10-CM

## 2023-08-27 DIAGNOSIS — E785 Hyperlipidemia, unspecified: Secondary | ICD-10-CM | POA: Diagnosis not present

## 2023-08-27 DIAGNOSIS — R809 Proteinuria, unspecified: Secondary | ICD-10-CM

## 2023-08-27 DIAGNOSIS — Z9884 Bariatric surgery status: Secondary | ICD-10-CM | POA: Diagnosis not present

## 2023-08-27 DIAGNOSIS — E1169 Type 2 diabetes mellitus with other specified complication: Secondary | ICD-10-CM | POA: Insufficient documentation

## 2023-08-27 DIAGNOSIS — E1165 Type 2 diabetes mellitus with hyperglycemia: Secondary | ICD-10-CM

## 2023-08-27 DIAGNOSIS — Z23 Encounter for immunization: Secondary | ICD-10-CM | POA: Diagnosis not present

## 2023-08-27 DIAGNOSIS — E039 Hypothyroidism, unspecified: Secondary | ICD-10-CM | POA: Diagnosis not present

## 2023-08-27 DIAGNOSIS — I10 Essential (primary) hypertension: Secondary | ICD-10-CM

## 2023-08-27 LAB — CBC WITH DIFFERENTIAL/PLATELET
Basophils Absolute: 0 10*3/uL (ref 0.0–0.1)
Basophils Relative: 0.7 % (ref 0.0–3.0)
Eosinophils Absolute: 0.1 10*3/uL (ref 0.0–0.7)
Eosinophils Relative: 1.8 % (ref 0.0–5.0)
HCT: 39.9 % (ref 36.0–46.0)
Hemoglobin: 12.8 g/dL (ref 12.0–15.0)
Lymphocytes Relative: 33.5 % (ref 12.0–46.0)
Lymphs Abs: 1.7 10*3/uL (ref 0.7–4.0)
MCHC: 32.2 g/dL (ref 30.0–36.0)
MCV: 82.5 fL (ref 78.0–100.0)
Monocytes Absolute: 0.4 10*3/uL (ref 0.1–1.0)
Monocytes Relative: 8.2 % (ref 3.0–12.0)
Neutro Abs: 2.9 10*3/uL (ref 1.4–7.7)
Neutrophils Relative %: 55.8 % (ref 43.0–77.0)
Platelets: 248 10*3/uL (ref 150.0–400.0)
RBC: 4.84 Mil/uL (ref 3.87–5.11)
RDW: 15.6 % — ABNORMAL HIGH (ref 11.5–15.5)
WBC: 5.1 10*3/uL (ref 4.0–10.5)

## 2023-08-27 LAB — LIPID PANEL
Cholesterol: 181 mg/dL (ref 0–200)
HDL: 84.4 mg/dL (ref >39.00)
LDL Cholesterol: 86 mg/dL (ref 0–99)
NonHDL: 96.9
Total CHOL/HDL Ratio: 2
Triglycerides: 53 mg/dL (ref 0.0–149.0)
VLDL: 10.6 mg/dL (ref 0.0–40.0)

## 2023-08-27 LAB — COMPREHENSIVE METABOLIC PANEL
ALT: 11 U/L (ref 0–35)
AST: 17 U/L (ref 0–37)
Albumin: 4.2 g/dL (ref 3.5–5.2)
Alkaline Phosphatase: 72 U/L (ref 39–117)
BUN: 7 mg/dL (ref 6–23)
CO2: 30 meq/L (ref 19–32)
Calcium: 9.8 mg/dL (ref 8.4–10.5)
Chloride: 103 meq/L (ref 96–112)
Creatinine, Ser: 0.7 mg/dL (ref 0.40–1.20)
GFR: 92.84 mL/min (ref >60.00)
Glucose, Bld: 84 mg/dL (ref 70–99)
Potassium: 3.7 meq/L (ref 3.5–5.1)
Sodium: 142 meq/L (ref 135–145)
Total Bilirubin: 0.5 mg/dL (ref 0.2–1.2)
Total Protein: 7.4 g/dL (ref 6.0–8.3)

## 2023-08-27 LAB — HEMOGLOBIN A1C: Hgb A1c MFr Bld: 5.7 % (ref 4.6–6.5)

## 2023-08-27 LAB — TSH: TSH: 1.36 u[IU]/mL (ref 0.35–5.50)

## 2023-08-27 LAB — VITAMIN B12: Vitamin B-12: >1537 pg/mL — ABNORMAL HIGH (ref 211–911)

## 2023-08-27 LAB — MICROALBUMIN / CREATININE URINE RATIO
Creatinine,U: 116.6 mg/dL
Microalb Creat Ratio: 116.4 mg/g — ABNORMAL HIGH (ref 0.0–30.0)
Microalb, Ur: 13.6 mg/dL — ABNORMAL HIGH (ref 0.0–1.9)

## 2023-08-27 LAB — T4, FREE: Free T4: 1.07 ng/dL (ref 0.60–1.60)

## 2023-08-27 MED ORDER — OZEMPIC (0.25 OR 0.5 MG/DOSE) 2 MG/3ML ~~LOC~~ SOPN
0.5000 mg | PEN_INJECTOR | SUBCUTANEOUS | 1 refills | Status: DC
  Filled 2023-08-27: qty 9, 84d supply, fill #0
  Filled 2023-11-21: qty 9, 84d supply, fill #1

## 2023-08-27 NOTE — Progress Notes (Signed)
 Established Patient Visit  Patient: Shelly Cohen   DOB: 1961/02/27   63 y.o. Female  MRN: [id number] Visit Date: 08/27/2023  Subjective:    Chief Complaint  Patient presents with   Follow-up    HTN   HPI HTN (hypertension), benign  Home BP: 132/76, 138/79, 121/80 Hx of white coat syndrome Compliant with DASH diet and current med doses BP Readings from Last 3 Encounters:  08/27/23 121/80  05/20/23 (!) 180/90  05/14/23 134/76    Repeat CMP Maintain med doses F/up in 6months  Hypothyroidism Repeat TSH and T4 Maintain levothyroxine dose  Hyperlipidemia associated with type 2 diabetes mellitus (HCC) Repeat lipid panel  DM (diabetes mellitus) (HCC) No adverse effects with ozempic Negative UACr Repeat hgbA1c, lipid panel and CMP Maintain med dose F/up in 6months  Reviewed medical, surgical, and social history today  Medications: Outpatient Medications Prior to Visit  Medication Sig   albuterol (VENTOLIN HFA) 108 (90 Base) MCG/ACT inhaler Inhale 1-2 puffs into the lungs every 6 (six) hours as needed for wheezing or shortness of breath.   amLODipine (NORVASC) 10 MG tablet Take 1 tablet (10 mg total) by mouth every evening.   BIOTIN PO Take 1 tablet by mouth daily.   CALCIUM-VITAMIN D PO Take 1 tablet by mouth daily.   carvedilol (COREG) 3.125 MG tablet Take 1 tablet (3.125 mg total) by mouth 2 (two) times daily.   docusate sodium (COLACE) 100 MG capsule Take 100 mg by mouth every other day.   fluticasone (FLONASE) 50 MCG/ACT nasal spray Place 1 spray into both nostrils daily as needed for allergies or rhinitis.   gabapentin (NEURONTIN) 100 MG capsule Take 1 capsule (100 mg total) by mouth 3 (three) times daily.   latanoprost (XALATAN) 0.005 % ophthalmic solution Place 1 drop into both eyes every evening.   levothyroxine (SYNTHROID) 50 MCG tablet Take 1 tablet (50 mcg total) by mouth daily before breakfast.   MELATONIN PO Take 1 tablet by mouth at  bedtime.   [DISCONTINUED] Semaglutide,0.25 or 0.5MG /DOS, (OZEMPIC, 0.25 OR 0.5 MG/DOSE,) 2 MG/3ML SOPN Inject 0.25mg  into the skin weekly for 2 weeks then 0.5 mg into the skin every 7 (seven) days.   [DISCONTINUED] budesonide-formoterol (SYMBICORT) 160-4.5 MCG/ACT inhaler Inhale 1 puff into the lungs 2 (two) times daily. Rinse mouth after each use (Patient not taking: Reported on 08/27/2023)   [DISCONTINUED] promethazine-dextromethorphan (PROMETHAZINE-DM) 6.25-15 MG/5ML syrup Take 5 mLs by mouth 3 (three) times daily as needed for cough.   No facility-administered medications prior to visit.   Reviewed past medical and social history.   ROS per HPI above      Objective:  BP 121/80 (Cuff Size: Normal) Comment: home BP reading  Pulse 82   Temp 98 F (36.7 C) (Temporal)   Ht 5\' 2"  (1.575 m)   Wt 178 lb 6.4 oz (80.9 kg)   SpO2 96%   BMI 32.63 kg/m      Physical Exam Vitals and nursing note reviewed.  Cardiovascular:     Rate and Rhythm: Normal rate and regular rhythm.     Pulses: Normal pulses.     Heart sounds: Normal heart sounds.  Pulmonary:     Effort: Pulmonary effort is normal.     Breath sounds: Normal breath sounds.  Neurological:     Mental Status: She is alert and oriented to person, place, and time.     No results found  for any visits on 08/27/23.    Assessment & Plan:    Problem List Items Addressed This Visit     DM (diabetes mellitus) (HCC)   No adverse effects with ozempic Negative UACr Repeat hgbA1c, lipid panel and CMP Maintain med dose F/up in 6months      Relevant Medications   Semaglutide,0.25 or 0.5MG /DOS, (OZEMPIC, 0.25 OR 0.5 MG/DOSE,) 2 MG/3ML SOPN   Other Relevant Orders   Hemoglobin A1c   Comprehensive metabolic panel   Microalbumin / creatinine urine ratio   HTN (hypertension), benign - Primary    Home BP: 132/76, 138/79, 121/80 Hx of white coat syndrome Compliant with DASH diet and current med doses BP Readings from Last 3  Encounters:  08/27/23 121/80  05/20/23 (!) 180/90  05/14/23 134/76    Repeat CMP Maintain med doses F/up in 6months      Relevant Orders   Comprehensive metabolic panel   Hyperlipidemia associated with type 2 diabetes mellitus (HCC)   Repeat lipid panel      Relevant Medications   Semaglutide,0.25 or 0.5MG /DOS, (OZEMPIC, 0.25 OR 0.5 MG/DOSE,) 2 MG/3ML SOPN   Other Relevant Orders   Lipid panel   Hypothyroidism   Repeat TSH and T4 Maintain levothyroxine dose      Relevant Orders   TSH   T4, free   Iron deficiency anemia   Relevant Orders   CBC with Differential/Platelet   Iron, TIBC and Ferritin Panel   S/P laparoscopic sleeve gastrectomy   Relevant Orders   CBC with Differential/Platelet   Iron, TIBC and Ferritin Panel   B12   Other Visit Diagnoses       Immunization due       Relevant Orders   Pneumococcal conjugate vaccine 20-valent (Prevnar 20) (Completed)      Return in about 6 months (around 02/24/2024) for HTN, DM, Hypothyroidism, hyperlipidemia (fasting).     Alysia Penna, NP

## 2023-08-27 NOTE — Assessment & Plan Note (Signed)
 Home BP: 132/76, 138/79, 121/80 Hx of white coat syndrome Compliant with DASH diet and current med doses BP Readings from Last 3 Encounters:  08/27/23 121/80  05/20/23 (!) 180/90  05/14/23 134/76    Repeat CMP Maintain med doses F/up in 6months

## 2023-08-27 NOTE — Assessment & Plan Note (Signed)
 Repeat TSH and T4 Maintain levothyroxine dose

## 2023-08-27 NOTE — Patient Instructions (Signed)
 Go to lab Maintain Heart healthy diet and daily exercise. Maintain current medications.

## 2023-08-27 NOTE — Assessment & Plan Note (Signed)
 Repeat lipid panel ?

## 2023-08-27 NOTE — Assessment & Plan Note (Signed)
 No adverse effects with ozempic Negative UACr Repeat hgbA1c, lipid panel and CMP Maintain med dose F/up in 6months

## 2023-08-28 LAB — IRON,TIBC AND FERRITIN PANEL
%SAT: 22 % (ref 16–45)
Ferritin: 133 ng/mL (ref 16–288)
Iron: 67 ug/dL (ref 45–160)
TIBC: 300 ug/dL (ref 250–450)

## 2023-08-29 ENCOUNTER — Encounter: Payer: Self-pay | Admitting: Pharmacist

## 2023-08-29 ENCOUNTER — Other Ambulatory Visit (HOSPITAL_COMMUNITY): Payer: Self-pay

## 2023-08-29 ENCOUNTER — Other Ambulatory Visit: Payer: Self-pay

## 2023-08-30 ENCOUNTER — Encounter: Payer: Self-pay | Admitting: Nurse Practitioner

## 2023-08-30 ENCOUNTER — Other Ambulatory Visit (HOSPITAL_COMMUNITY): Payer: Self-pay

## 2023-08-30 ENCOUNTER — Other Ambulatory Visit: Payer: Self-pay

## 2023-08-30 DIAGNOSIS — E1169 Type 2 diabetes mellitus with other specified complication: Secondary | ICD-10-CM

## 2023-08-30 MED ORDER — PRAVASTATIN SODIUM 20 MG PO TABS
20.0000 mg | ORAL_TABLET | Freq: Every day | ORAL | 1 refills | Status: DC
  Filled 2023-08-30: qty 90, 90d supply, fill #0

## 2023-08-30 NOTE — Addendum Note (Signed)
 Addended by: Alysia Penna L on: 08/30/2023 10:20 AM   Modules accepted: Orders

## 2023-09-24 ENCOUNTER — Encounter: Payer: Self-pay | Admitting: Nurse Practitioner

## 2023-10-01 ENCOUNTER — Encounter: Payer: Self-pay | Admitting: Nurse Practitioner

## 2023-10-02 ENCOUNTER — Other Ambulatory Visit: Payer: Self-pay | Admitting: *Deleted

## 2023-10-02 ENCOUNTER — Ambulatory Visit (INDEPENDENT_AMBULATORY_CARE_PROVIDER_SITE_OTHER): Admitting: Nurse Practitioner

## 2023-10-02 ENCOUNTER — Encounter: Payer: Self-pay | Admitting: Nurse Practitioner

## 2023-10-02 VITALS — BP 142/80 | HR 85 | Temp 97.6°F | Ht 62.0 in | Wt 184.6 lb

## 2023-10-02 DIAGNOSIS — T466X5A Adverse effect of antihyperlipidemic and antiarteriosclerotic drugs, initial encounter: Secondary | ICD-10-CM

## 2023-10-02 DIAGNOSIS — E1165 Type 2 diabetes mellitus with hyperglycemia: Secondary | ICD-10-CM

## 2023-10-02 DIAGNOSIS — M791 Myalgia, unspecified site: Secondary | ICD-10-CM

## 2023-10-02 DIAGNOSIS — R809 Proteinuria, unspecified: Secondary | ICD-10-CM

## 2023-10-02 LAB — CBC
HCT: 40.7 % (ref 36.0–46.0)
Hemoglobin: 13.1 g/dL (ref 12.0–15.0)
MCHC: 32.1 g/dL (ref 30.0–36.0)
MCV: 82.3 fl (ref 78.0–100.0)
Platelets: 261 10*3/uL (ref 150.0–400.0)
RBC: 4.94 Mil/uL (ref 3.87–5.11)
RDW: 16.2 % — ABNORMAL HIGH (ref 11.5–15.5)
WBC: 6.1 10*3/uL (ref 4.0–10.5)

## 2023-10-02 LAB — BASIC METABOLIC PANEL WITH GFR
BUN: 13 mg/dL (ref 6–23)
CO2: 30 meq/L (ref 19–32)
Calcium: 9.8 mg/dL (ref 8.4–10.5)
Chloride: 103 meq/L (ref 96–112)
Creatinine, Ser: 0.81 mg/dL (ref 0.40–1.20)
GFR: 77.87 mL/min (ref >60.00)
Glucose, Bld: 84 mg/dL (ref 70–99)
Potassium: 3.7 meq/L (ref 3.5–5.1)
Sodium: 140 meq/L (ref 135–145)

## 2023-10-02 LAB — CK: Total CK: 92 U/L (ref 7–177)

## 2023-10-02 LAB — C-REACTIVE PROTEIN: CRP: 2.1 mg/dL (ref 0.5–20.0)

## 2023-10-02 LAB — SEDIMENTATION RATE: Sed Rate: 59 mm/h — ABNORMAL HIGH (ref 0–30)

## 2023-10-02 LAB — MICROALBUMIN / CREATININE URINE RATIO
Creatinine,U: 27.2 mg/dL
Microalb Creat Ratio: 56.7 mg/g — ABNORMAL HIGH (ref 0.0–30.0)
Microalb, Ur: 1.5 mg/dL (ref 0.0–1.9)

## 2023-10-02 NOTE — Addendum Note (Signed)
 Addended by: Tonita Phoenix on: 10/02/2023 01:12 PM   Modules accepted: Orders

## 2023-10-02 NOTE — Assessment & Plan Note (Signed)
 Increase muscle and joint pain with pravastatin prescribed 32month ago. She stopped med 32month ago with minimal improvement. No rash, no lymphadenopathy. No fever/chills, no unintentional weight loss, no joint effusion or erythema.  Continue to hold pravastatin Repeat CK, ESR and CRP today.

## 2023-10-02 NOTE — Progress Notes (Signed)
 Established Patient Visit  Patient: Shelly Cohen   DOB: 1961/06/17   63 y.o. Female  MRN: [id number] Visit Date: 10/02/2023  Subjective:    Chief Complaint  Patient presents with   Hip Pain    2 weeks achy pain    Hip Pain    Myalgia due to statin Increase muscle and joint pain with pravastatin prescribed 721month ago. She stopped med 721month ago with minimal improvement. No rash, no lymphadenopathy. No fever/chills, no unintentional weight loss, no joint effusion or erythema.  Continue to hold pravastatin Repeat CK, ESR and CRP today.  BP Readings from Last 3 Encounters:  10/02/23 (!) 142/80  08/27/23 121/80  05/20/23 (!) 180/90    Wt Readings from Last 3 Encounters:  10/02/23 184 lb 9.6 oz (83.7 kg)  08/27/23 178 lb 6.4 oz (80.9 kg)  05/20/23 181 lb 9.6 oz (82.4 kg)    Reviewed medical, surgical, and social history today  Medications: Outpatient Medications Prior to Visit  Medication Sig Note   albuterol (VENTOLIN HFA) 108 (90 Base) MCG/ACT inhaler Inhale 1-2 puffs into the lungs every 6 (six) hours as needed for wheezing or shortness of breath.    amLODipine (NORVASC) 10 MG tablet Take 1 tablet (10 mg total) by mouth every evening.    BIOTIN PO Take 1 tablet by mouth daily.    CALCIUM-VITAMIN D PO Take 1 tablet by mouth daily.    carvedilol (COREG) 3.125 MG tablet Take 1 tablet (3.125 mg total) by mouth 2 (two) times daily.    docusate sodium (COLACE) 100 MG capsule Take 100 mg by mouth every other day.    fluticasone (FLONASE) 50 MCG/ACT nasal spray Place 1 spray into both nostrils daily as needed for allergies or rhinitis.    gabapentin (NEURONTIN) 100 MG capsule Take 1 capsule (100 mg total) by mouth 3 (three) times daily.    latanoprost (XALATAN) 0.005 % ophthalmic solution Place 1 drop into both eyes every evening.    levothyroxine (SYNTHROID) 50 MCG tablet Take 1 tablet (50 mcg total) by mouth daily before breakfast.    MELATONIN PO Take 10 mg  by mouth at bedtime.    Semaglutide,0.25 or 0.5MG /DOS, (OZEMPIC, 0.25 OR 0.5 MG/DOSE,) 2 MG/3ML SOPN Inject 0.5 mg into the skin every 7 (seven) days.    [DISCONTINUED] pravastatin (PRAVACHOL) 20 MG tablet Take 1 tablet (20 mg total) by mouth daily. (Patient not taking: Reported on 10/02/2023) 10/02/2023: On hold per provider    No facility-administered medications prior to visit.   Reviewed past medical and social history.   ROS per HPI above      Objective:  BP (!) 142/80 (BP Location: Left Arm, Patient Position: Sitting, Cuff Size: Normal)   Pulse 85   Temp 97.6 F (36.4 C) (Temporal)   Ht 5\' 2"  (1.575 m)   Wt 184 lb 9.6 oz (83.7 kg)   SpO2 98%   BMI 33.76 kg/m      Physical Exam Vitals and nursing note reviewed.  Neck:     Thyroid: No thyroid mass, thyromegaly or thyroid tenderness.  Cardiovascular:     Rate and Rhythm: Normal rate.     Pulses: Normal pulses.  Pulmonary:     Effort: Pulmonary effort is normal.  Musculoskeletal:        General: No swelling.     Cervical back: Normal range of motion and neck supple.  Right lower leg: No edema.     Left lower leg: No edema.  Lymphadenopathy:     Cervical: No cervical adenopathy.  Neurological:     Mental Status: She is alert and oriented to person, place, and time.  Psychiatric:        Mood and Affect: Mood normal.        Thought Content: Thought content normal.     No results found for any visits on 10/02/23.    Assessment & Plan:    Problem List Items Addressed This Visit     Myalgia due to statin - Primary   Increase muscle and joint pain with pravastatin prescribed 50month ago. She stopped med 50month ago with minimal improvement. No rash, no lymphadenopathy. No fever/chills, no unintentional weight loss, no joint effusion or erythema.  Continue to hold pravastatin Repeat CK, ESR and CRP today.      Relevant Orders   CK   Sedimentation rate   C-reactive protein   CBC   Basic metabolic panel with  GFR   Return if symptoms worsen or fail to improve.     Alysia Penna, NP

## 2023-10-02 NOTE — Patient Instructions (Signed)
 Continue to hold pravastatin Ok to use advil or aleve  for pain. Take with food Maintain adequate oral hydration

## 2023-10-03 ENCOUNTER — Other Ambulatory Visit (HOSPITAL_COMMUNITY): Payer: Self-pay

## 2023-10-03 ENCOUNTER — Other Ambulatory Visit: Payer: Self-pay | Admitting: Nurse Practitioner

## 2023-10-03 ENCOUNTER — Encounter: Payer: Self-pay | Admitting: Nurse Practitioner

## 2023-10-03 DIAGNOSIS — R200 Anesthesia of skin: Secondary | ICD-10-CM

## 2023-10-03 MED ORDER — GABAPENTIN 100 MG PO CAPS
ORAL_CAPSULE | ORAL | 11 refills | Status: DC
  Filled 2023-10-03 – 2023-10-04 (×2): qty 120, 30d supply, fill #0

## 2023-10-04 ENCOUNTER — Other Ambulatory Visit (HOSPITAL_COMMUNITY): Payer: Self-pay

## 2023-10-04 ENCOUNTER — Other Ambulatory Visit: Payer: Self-pay

## 2023-10-09 ENCOUNTER — Encounter: Payer: Self-pay | Admitting: Nurse Practitioner

## 2023-10-17 ENCOUNTER — Other Ambulatory Visit (HOSPITAL_COMMUNITY): Payer: Self-pay

## 2023-10-17 ENCOUNTER — Encounter: Payer: Self-pay | Admitting: Nurse Practitioner

## 2023-10-17 DIAGNOSIS — R200 Anesthesia of skin: Secondary | ICD-10-CM

## 2023-10-17 MED ORDER — GABAPENTIN 300 MG PO CAPS
300.0000 mg | ORAL_CAPSULE | Freq: Two times a day (BID) | ORAL | 1 refills | Status: DC
  Filled 2023-10-17: qty 180, 90d supply, fill #0
  Filled 2024-01-11 (×2): qty 180, 90d supply, fill #1

## 2023-10-28 ENCOUNTER — Other Ambulatory Visit: Payer: Self-pay | Admitting: Nurse Practitioner

## 2023-10-28 DIAGNOSIS — Z1231 Encounter for screening mammogram for malignant neoplasm of breast: Secondary | ICD-10-CM

## 2023-11-06 ENCOUNTER — Other Ambulatory Visit (HOSPITAL_COMMUNITY): Payer: Self-pay

## 2023-11-11 DIAGNOSIS — H40011 Open angle with borderline findings, low risk, right eye: Secondary | ICD-10-CM | POA: Diagnosis not present

## 2023-11-11 DIAGNOSIS — H401121 Primary open-angle glaucoma, left eye, mild stage: Secondary | ICD-10-CM | POA: Diagnosis not present

## 2023-11-14 ENCOUNTER — Other Ambulatory Visit (HOSPITAL_COMMUNITY): Payer: Self-pay

## 2023-11-14 ENCOUNTER — Other Ambulatory Visit: Payer: Self-pay

## 2023-11-14 ENCOUNTER — Other Ambulatory Visit: Payer: Self-pay | Admitting: Nurse Practitioner

## 2023-11-14 DIAGNOSIS — I10 Essential (primary) hypertension: Secondary | ICD-10-CM

## 2023-11-14 MED ORDER — AMLODIPINE BESYLATE 10 MG PO TABS
10.0000 mg | ORAL_TABLET | Freq: Every evening | ORAL | 1 refills | Status: DC
  Filled 2023-11-14: qty 90, 90d supply, fill #0
  Filled 2024-02-14: qty 90, 90d supply, fill #1

## 2023-11-21 ENCOUNTER — Other Ambulatory Visit (HOSPITAL_COMMUNITY): Payer: Self-pay

## 2023-11-26 LAB — HM DIABETES EYE EXAM

## 2023-12-09 ENCOUNTER — Ambulatory Visit
Admission: RE | Admit: 2023-12-09 | Discharge: 2023-12-09 | Disposition: A | Discharge status: Home / Self Care | Source: Ambulatory Visit | Attending: Nurse Practitioner

## 2023-12-09 ENCOUNTER — Other Ambulatory Visit (INDEPENDENT_AMBULATORY_CARE_PROVIDER_SITE_OTHER): Payer: Self-pay

## 2023-12-09 ENCOUNTER — Ambulatory Visit (INDEPENDENT_AMBULATORY_CARE_PROVIDER_SITE_OTHER): Admitting: Orthopaedic Surgery

## 2023-12-09 DIAGNOSIS — Z1231 Encounter for screening mammogram for malignant neoplasm of breast: Secondary | ICD-10-CM

## 2023-12-09 DIAGNOSIS — M25552 Pain in left hip: Secondary | ICD-10-CM

## 2023-12-09 DIAGNOSIS — Z96641 Presence of right artificial hip joint: Secondary | ICD-10-CM | POA: Diagnosis not present

## 2023-12-09 DIAGNOSIS — M25551 Pain in right hip: Secondary | ICD-10-CM | POA: Diagnosis not present

## 2023-12-09 NOTE — Progress Notes (Signed)
 The patient is a 63 year old female who we replaced her right hip back in March 2020 secondary to severe arthritis.  She says the hip has never gotten better since surgery and still causes her to have a lot of pain.  She has been through several rounds of physical therapy.  We have replaced her husband's hip and he had done quite well.  Her right hip arthritis was quite significant.  She is now developing some left hip and groin pain.  Both hips move smoothly and fluidly but she has a lot of guarding with her right hip.  There is some pain in the groin with the left hip but no blocks or rotation.  An AP pelvis and lateral both hips shows a well-seated right total hip arthroplasty with no complicating features.  The left hip joint space is getting more narrow and more arthritic when compared to films from 2020.  I am not a loss of why her right hip hurts her the way that it does and especially the fact that she has never gotten relief even from surgery.  At this point we do need to obtain a MRI of her right hip and her left hip to assess the cartilage on the left hip and assess for any complicating features of the right hip replacement.  We will then see her in follow-up after this MRI.  She agrees with treatment plan.

## 2023-12-11 ENCOUNTER — Ambulatory Visit: Payer: Self-pay | Admitting: Nurse Practitioner

## 2023-12-17 ENCOUNTER — Encounter: Payer: Self-pay | Admitting: Orthopaedic Surgery

## 2023-12-18 ENCOUNTER — Other Ambulatory Visit: Payer: Self-pay

## 2023-12-18 DIAGNOSIS — Z96641 Presence of right artificial hip joint: Secondary | ICD-10-CM

## 2023-12-18 DIAGNOSIS — M25552 Pain in left hip: Secondary | ICD-10-CM

## 2023-12-21 ENCOUNTER — Ambulatory Visit
Admission: RE | Admit: 2023-12-21 | Discharge: 2023-12-21 | Disposition: A | Discharge status: Home / Self Care | Source: Ambulatory Visit | Attending: Orthopaedic Surgery | Admitting: Orthopaedic Surgery

## 2023-12-21 DIAGNOSIS — Z96641 Presence of right artificial hip joint: Secondary | ICD-10-CM | POA: Diagnosis not present

## 2023-12-21 DIAGNOSIS — M160 Bilateral primary osteoarthritis of hip: Secondary | ICD-10-CM | POA: Diagnosis not present

## 2023-12-21 DIAGNOSIS — M25552 Pain in left hip: Secondary | ICD-10-CM

## 2024-01-11 ENCOUNTER — Other Ambulatory Visit (HOSPITAL_COMMUNITY): Payer: Self-pay

## 2024-01-14 ENCOUNTER — Encounter: Payer: Self-pay | Admitting: Orthopaedic Surgery

## 2024-01-28 ENCOUNTER — Other Ambulatory Visit: Payer: Self-pay

## 2024-01-28 ENCOUNTER — Emergency Department (HOSPITAL_BASED_OUTPATIENT_CLINIC_OR_DEPARTMENT_OTHER): Admission: EM | Admit: 2024-01-28 | Discharge: 2024-01-28 | Disposition: A | Discharge status: Home / Self Care

## 2024-01-28 ENCOUNTER — Other Ambulatory Visit: Payer: Self-pay | Admitting: Nurse Practitioner

## 2024-01-28 ENCOUNTER — Encounter (HOSPITAL_BASED_OUTPATIENT_CLINIC_OR_DEPARTMENT_OTHER): Payer: Self-pay

## 2024-01-28 DIAGNOSIS — I10 Essential (primary) hypertension: Secondary | ICD-10-CM | POA: Diagnosis not present

## 2024-01-28 DIAGNOSIS — S46912A Strain of unspecified muscle, fascia and tendon at shoulder and upper arm level, left arm, initial encounter: Secondary | ICD-10-CM | POA: Insufficient documentation

## 2024-01-28 DIAGNOSIS — M62830 Muscle spasm of back: Secondary | ICD-10-CM | POA: Diagnosis not present

## 2024-01-28 DIAGNOSIS — S4992XA Unspecified injury of left shoulder and upper arm, initial encounter: Secondary | ICD-10-CM | POA: Diagnosis present

## 2024-01-28 DIAGNOSIS — T148XXA Other injury of unspecified body region, initial encounter: Secondary | ICD-10-CM

## 2024-01-28 DIAGNOSIS — X58XXXA Exposure to other specified factors, initial encounter: Secondary | ICD-10-CM | POA: Insufficient documentation

## 2024-01-28 DIAGNOSIS — S39012A Strain of muscle, fascia and tendon of lower back, initial encounter: Secondary | ICD-10-CM | POA: Diagnosis not present

## 2024-01-28 DIAGNOSIS — Z96641 Presence of right artificial hip joint: Secondary | ICD-10-CM | POA: Insufficient documentation

## 2024-01-28 DIAGNOSIS — E039 Hypothyroidism, unspecified: Secondary | ICD-10-CM

## 2024-01-28 MED ORDER — LIDOCAINE 5 % EX PTCH
1.0000 | MEDICATED_PATCH | CUTANEOUS | Status: DC
  Administered 2024-01-28: 1 via TRANSDERMAL
  Filled 2024-01-28: qty 1

## 2024-01-28 MED ORDER — KETOROLAC TROMETHAMINE 15 MG/ML IJ SOLN
15.0000 mg | Freq: Once | INTRAMUSCULAR | Status: AC
  Administered 2024-01-28: 15 mg via INTRAMUSCULAR
  Filled 2024-01-28: qty 1

## 2024-01-28 MED ORDER — LEVOTHYROXINE SODIUM 50 MCG PO TABS
50.0000 ug | ORAL_TABLET | Freq: Every day | ORAL | 1 refills | Status: DC
  Filled 2024-01-28: qty 90, 90d supply, fill #0
  Filled 2024-04-30: qty 90, 90d supply, fill #1

## 2024-01-28 NOTE — Discharge Instructions (Signed)
 For pain, you can take 1000 mg of Tylenol  or 1 g of Tylenol  every 6-8 hours.  Do not exceed more than 4000 mg or 4 g in a 24-hour period.  You can also take ibuprofen  600 to 800 mg every 6-8 hours as well.  Do not take this high-dose ibuprofen  for greater than a week.  Also purchase lidocaine  patches.  You can purchase this at any pharmacy.   If you have any kind of chest pain or shortness of breath and come to ED for further evaluation.   Is likely muscle strain versus irritation.  Please rest over the next couple days.

## 2024-01-28 NOTE — ED Provider Notes (Signed)
 [place] [facility] Provider Note   CSN: [id number] Arrival date & time: 01/28/24  1005     Patient presents with: Back Pain   Shelly Cohen is a 63 y.o. female.   HPI   ]  Patient presents because of left shoulder, left back pain.  Patient states that she woke up with this pain.  Hurts with movements.  Hurts with palpation along the left shoulder and left back along the left scapula.  No trauma.  No shortness of breath.  No chest pain.  No pleuritic chest pain.  No hemoptysis.  No numbness or ting anywhere.  Patient states that she just feels like it is cramping.  She took a muscle relaxer earlier which helped resolve some of the pain.  No exertional chest pain.  Denies all other complaints.  No midline back pain.  Previous medical history reviewed : Patient was seen by orthopedic surgery in June 2025.  Hip replacement right hip March 2020.  Left hip was noted to be more arthritic and narrow.  Orthopedic surgery recommend MRI of her right hip given ongoing pain as well as her left hip.  MRI completed of the right hip.  Unremarkable.  Left hip showed mild degenerative changes left hip.   Prior to Admission medications   Medication Sig Start Date End Date Taking? Authorizing Provider  albuterol  (VENTOLIN  HFA) 108 (90 Base) MCG/ACT inhaler Inhale 1-2 puffs into the lungs every 6 (six) hours as needed for wheezing or shortness of breath. 06/07/22   Nche, Roselie Rockford, NP  amLODipine  (NORVASC ) 10 MG tablet Take 1 tablet (10 mg total) by mouth every evening. 11/14/23   Nche, Roselie Rockford, NP  BIOTIN PO Take 1 tablet by mouth daily.    [provider]  CALCIUM-VITAMIN D  PO Take 1 tablet by mouth daily.    [provider]  carvedilol  (COREG ) 3.125 MG tablet Take 1 tablet (3.125 mg total) by mouth 2 (two) times daily. 05/20/23   Nche, Roselie Rockford, NP  docusate sodium  (COLACE) 100 MG capsule Take 100 mg by mouth every other day.     [provider]  fluticasone (FLONASE) 50 MCG/ACT nasal spray Place 1 spray into both nostrils daily as needed for allergies or rhinitis.    [provider]  gabapentin  (NEURONTIN ) 300 MG capsule Take 1 capsule (300 mg total) by mouth 2 (two) times daily. 10/17/23   Nche, Roselie Rockford, NP  latanoprost  (XALATAN ) 0.005 % ophthalmic solution Place 1 drop into both eyes every evening. 10/26/22     levothyroxine  (SYNTHROID ) 50 MCG tablet Take 1 tablet (50 mcg total) by mouth daily before breakfast. 05/20/23   Nche, Roselie Rockford, NP  MELATONIN PO Take 10 mg by mouth at bedtime.    [provider]  Semaglutide ,0.25 or 0.5MG /DOS, (OZEMPIC , 0.25 OR 0.5 MG/DOSE,) 2 MG/3ML SOPN Inject 0.5 mg into the skin every 7 (seven) days. 08/27/23   Nche, Roselie Rockford, NP    Allergies: Dilaudid  [hydromorphone  hcl], Diphenhydramine  hcl, Hydrocodone , Oxycodone , Azithromycin, Cephalexin, Penicillin g, and Zolpidem     Review of Systems  Constitutional:  Negative for chills and fever.  HENT:  Negative for ear pain and sore throat.   Eyes:  Negative for pain and visual disturbance.  Respiratory:  Negative for cough and shortness of breath.   Cardiovascular:  Negative for chest pain and palpitations.  Gastrointestinal:  Negative for abdominal pain and vomiting.  Genitourinary:  Negative for dysuria and hematuria.  Musculoskeletal:  Negative for arthralgias and back pain.  Skin:  Negative for color change and rash.  Neurological:  Negative for seizures and syncope.  All other systems reviewed and are negative.   Updated Vital Signs BP (!) 148/111 (BP Location: Right Arm)   Pulse 83   Temp 97.7 F (36.5 C) (Oral)   Resp 20   SpO2 100%   Physical Exam Vitals and nursing note reviewed.  Constitutional:      General: She is not in acute distress.    Appearance: She is well-developed.  HENT:     Head: Normocephalic and atraumatic.  Eyes:     Conjunctiva/sclera: Conjunctivae normal.   Cardiovascular:     Rate and Rhythm: Normal rate and regular rhythm.     Heart sounds: No murmur heard. Pulmonary:     Effort: Pulmonary effort is normal. No respiratory distress.     Breath sounds: Normal breath sounds.  Abdominal:     Palpations: Abdomen is soft.     Tenderness: There is no abdominal tenderness.  Musculoskeletal:        General: No swelling.       Arms:     Cervical back: Neck supple.  Skin:    General: Skin is warm and dry.     Capillary Refill: Capillary refill takes less than 2 seconds.  Neurological:     Mental Status: She is alert.  Psychiatric:        Mood and Affect: Mood normal.     (all labs ordered are listed, but only abnormal results are displayed) Labs Reviewed - No data to display  EKG: EKG Interpretation Date/Time:  Tuesday January 28 2024 10:35:58 EDT Ventricular Rate:  74 PR Interval:  200 QRS Duration:  96 QT Interval:  367 QTC Calculation: 408 R Axis:   27  Text Interpretation: Sinus rhythm Confirmed by Simon Rea [phone]) on 01/28/2024 10:37:02 AM  Radiology: No results found.   Procedures   Medications Ordered in the ED  lidocaine  (LIDODERM ) 5 % 1 patch (1 patch Transdermal Patch Applied 01/28/24 1037)  ketorolac  (TORADOL ) 15 MG/ML injection 15 mg (15 mg Intramuscular Given 01/28/24 1037)                                    Medical Decision Making Risk Prescription drug management.  Patient presents because of left shoulder, left back pain.  Patient states that she woke up with this pain.  Hurts with movements.  Hurts with palpation along the left shoulder and left back along the left scapula.  No trauma.  No shortness of breath.  No chest pain.  No pleuritic chest pain.  No hemoptysis.  No numbness or ting anywhere.  Patient states that she just feels like it is cramping.  She took a muscle relaxer earlier which helped resolve some of the pain.  No exertional chest pain.  Denies all other complaints.  No midline back  pain.  Previous medical history reviewed : Patient was seen by orthopedic surgery in June 2025.  Hip replacement right hip March 2020.  Left hip was noted to be more arthritic and narrow.  Orthopedic surgery recommend MRI of her right hip given ongoing pain as well as her left hip.  MRI completed of the right hip.  Unremarkable.  Left hip showed mild degenerative changes left hip.  Upon exam, and ankle stable.  ANO x 3 GCS 15.  The patient's pain is reproducible along the left shoulder as well as left back.  Hurts with all movements of the shoulder.  Both abduction and abduction as well as extension and flexion.  This causes pain behind the left scapula as well.  Hurts with manipulation of this area with palpation.  All consistent for MSK irritation/strain.  No concerns for, fracture.  No trauma.  Unclear etiology of this at this time.  Will start patient on IM Toradol  as well as lidocaine  patch here.  She can take Tylenol  and ibuprofen  at home.  No chest pain.  No shortness of breath.  No concern for cardiac etiology.  No concern for acute pulmonary etiology this time.  All seems to be very superficial along the muscle group.  Denies all chest pain.  No exertional chest pain.  I did obtain EKG but this was unremarkable.  Normal sinus rhythm.  No STEMI.    She can take   Final diagnoses:  Muscle strain  Muscle spasm of back    ED Discharge Orders     None          Simon Lavonia SAILOR, MD 01/28/24 1039

## 2024-01-28 NOTE — ED Triage Notes (Signed)
 Pt presents with complaints of upper back pain and left shoulder pain that started this am. Denies any other symptoms

## 2024-02-05 ENCOUNTER — Other Ambulatory Visit (HOSPITAL_COMMUNITY): Payer: Self-pay

## 2024-02-05 ENCOUNTER — Ambulatory Visit (INDEPENDENT_AMBULATORY_CARE_PROVIDER_SITE_OTHER): Admitting: Orthopaedic Surgery

## 2024-02-05 ENCOUNTER — Encounter: Payer: Self-pay | Admitting: Orthopaedic Surgery

## 2024-02-05 DIAGNOSIS — M25551 Pain in right hip: Secondary | ICD-10-CM | POA: Diagnosis not present

## 2024-02-05 DIAGNOSIS — Z96641 Presence of right artificial hip joint: Secondary | ICD-10-CM | POA: Diagnosis not present

## 2024-02-05 DIAGNOSIS — M25552 Pain in left hip: Secondary | ICD-10-CM | POA: Diagnosis not present

## 2024-02-05 MED ORDER — TIZANIDINE HCL 4 MG PO TABS
4.0000 mg | ORAL_TABLET | Freq: Two times a day (BID) | ORAL | 1 refills | Status: DC | PRN
  Filled 2024-02-05: qty 60, 30d supply, fill #0

## 2024-02-05 NOTE — Progress Notes (Signed)
 The patient comes in today to go over MRI of both her hips.  We replaced her right hip back in 2020.  That hip has been painful for her ever since surgery.  She has pain in the left hip and pain down her legs as well.  She cannot take anti-inflammatories secondary to previous gastric bypass surgery.  She says most of her discomfort is at night when she finally gets home and lays down.  Both hips move smoothly and fluidly but she does get a lot of guarding with the right hip.  The left hip moves smoothly today and no pain with the left hip.  X-rays of both hips were unremarkable showing a well-seated right total hip arthroplasty and well-maintained joint space in the left hip.  The MRI of both hips shows no complicating features of the right hip replacement.  There is no effusion or fluid collections.  There is no evidence of loosening or muscle or tendon issues.  The left hip only shows mild arthritis.  At this point I would like to send in a muscle relaxer for her to take in the evening which would be Zanaflex  and she will take this as needed.  She will still work on weight loss and activity modification but I am not sure what else that we can recommend based on what she is feeling.  She denies any weakness in her feet or numbness and tingling in her feet or legs.

## 2024-02-14 ENCOUNTER — Other Ambulatory Visit: Payer: Self-pay | Admitting: Nurse Practitioner

## 2024-02-14 ENCOUNTER — Other Ambulatory Visit (HOSPITAL_COMMUNITY): Payer: Self-pay

## 2024-02-14 ENCOUNTER — Other Ambulatory Visit: Payer: Self-pay

## 2024-02-14 DIAGNOSIS — E1165 Type 2 diabetes mellitus with hyperglycemia: Secondary | ICD-10-CM

## 2024-02-17 ENCOUNTER — Other Ambulatory Visit: Payer: Self-pay

## 2024-02-17 ENCOUNTER — Other Ambulatory Visit (HOSPITAL_COMMUNITY): Payer: Self-pay

## 2024-02-17 MED ORDER — OZEMPIC (0.25 OR 0.5 MG/DOSE) 2 MG/3ML ~~LOC~~ SOPN
0.5000 mg | PEN_INJECTOR | SUBCUTANEOUS | 0 refills | Status: DC
  Filled 2024-02-17: qty 9, 84d supply, fill #0

## 2024-02-25 ENCOUNTER — Ambulatory Visit (INDEPENDENT_AMBULATORY_CARE_PROVIDER_SITE_OTHER): Payer: Commercial Managed Care - PPO | Admitting: Nurse Practitioner

## 2024-02-25 ENCOUNTER — Encounter: Payer: Self-pay | Admitting: Nurse Practitioner

## 2024-02-25 ENCOUNTER — Other Ambulatory Visit (HOSPITAL_COMMUNITY): Payer: Self-pay

## 2024-02-25 ENCOUNTER — Other Ambulatory Visit: Payer: Self-pay

## 2024-02-25 VITALS — BP 136/78 | HR 90 | Temp 97.8°F | Ht 62.0 in | Wt 189.6 lb

## 2024-02-25 DIAGNOSIS — E039 Hypothyroidism, unspecified: Secondary | ICD-10-CM | POA: Diagnosis not present

## 2024-02-25 DIAGNOSIS — E1169 Type 2 diabetes mellitus with other specified complication: Secondary | ICD-10-CM | POA: Diagnosis not present

## 2024-02-25 DIAGNOSIS — I10 Essential (primary) hypertension: Secondary | ICD-10-CM | POA: Diagnosis not present

## 2024-02-25 DIAGNOSIS — E785 Hyperlipidemia, unspecified: Secondary | ICD-10-CM

## 2024-02-25 DIAGNOSIS — M791 Myalgia, unspecified site: Secondary | ICD-10-CM

## 2024-02-25 LAB — LIPID PANEL
Cholesterol: 205 mg/dL — ABNORMAL HIGH (ref 0–200)
HDL: 87.3 mg/dL (ref >39.00)
LDL Cholesterol: 102 mg/dL — ABNORMAL HIGH (ref 0–99)
NonHDL: 117.91
Total CHOL/HDL Ratio: 2
Triglycerides: 78 mg/dL (ref 0.0–149.0)
VLDL: 15.6 mg/dL (ref 0.0–40.0)

## 2024-02-25 LAB — HEMOGLOBIN A1C: Hgb A1c MFr Bld: 5.9 % (ref 4.6–6.5)

## 2024-02-25 LAB — BASIC METABOLIC PANEL WITH GFR
BUN: 9 mg/dL (ref 6–23)
CO2: 28 meq/L (ref 19–32)
Calcium: 10 mg/dL (ref 8.4–10.5)
Chloride: 103 meq/L (ref 96–112)
Creatinine, Ser: 0.71 mg/dL (ref 0.40–1.20)
GFR: 90.95 mL/min (ref >60.00)
Glucose, Bld: 89 mg/dL (ref 70–99)
Potassium: 3.8 meq/L (ref 3.5–5.1)
Sodium: 143 meq/L (ref 135–145)

## 2024-02-25 LAB — T4, FREE: Free T4: 0.86 ng/dL (ref 0.60–1.60)

## 2024-02-25 LAB — TSH: TSH: 1.86 u[IU]/mL (ref 0.35–5.50)

## 2024-02-25 MED ORDER — PREGABALIN 75 MG PO CAPS
75.0000 mg | ORAL_CAPSULE | Freq: Two times a day (BID) | ORAL | 5 refills | Status: DC
  Filled 2024-02-25: qty 60, 30d supply, fill #0
  Filled 2024-03-22 – 2024-03-24 (×2): qty 60, 30d supply, fill #1
  Filled 2024-05-16: qty 60, 30d supply, fill #2

## 2024-02-25 MED ORDER — AMLODIPINE BESYLATE 10 MG PO TABS
10.0000 mg | ORAL_TABLET | Freq: Every evening | ORAL | 3 refills | Status: AC
Start: 2024-02-25 — End: ?
  Filled 2024-02-25 – 2024-05-13 (×2): qty 90, 90d supply, fill #0

## 2024-02-25 MED ORDER — CARVEDILOL 3.125 MG PO TABS
3.1250 mg | ORAL_TABLET | Freq: Two times a day (BID) | ORAL | 3 refills | Status: AC
  Filled 2024-02-25 – 2024-05-13 (×2): qty 180, 90d supply, fill #0

## 2024-02-25 NOTE — Assessment & Plan Note (Signed)
 Repeat TSH and T4 Maintain levothyroxine dose

## 2024-02-25 NOTE — Patient Instructions (Addendum)
 Go to lab Stop gabapentin  Start lyrica  Maintain Heart healthy diet and daily exercise. Maintain current medications.

## 2024-02-25 NOTE — Assessment & Plan Note (Signed)
 Myalgia did not improve with discontinuation of statin Repeat lipid panel

## 2024-02-25 NOTE — Assessment & Plan Note (Signed)
 No adverse effects with ozempic  Normal foot exam BP at goal Requested eye exam report Improved UACr but not at goal, consider adding lisinopril  5mg  vs increase ozempic  dose? Repeat hgbA1c, lipid panel and BMP F/up in 3months

## 2024-02-25 NOTE — Progress Notes (Signed)
 Established Patient Visit  Patient: Shelly Cohen   DOB: 06-20-1961   63 y.o. Female  MRN: [id number] Visit Date: 02/25/2024  Subjective:    Chief Complaint  Patient presents with   Follow-up    FASTING 6 month follow up for DM, HTN, Hypothyroidism and Hyperlipidemia Left shoulder pain, still having achy-ness in bones  DUE for foot exam  REQUESTING eye exams from [place] and [facility]   HPI HTN (hypertension), benign  Home BP: 140/80, 136/79, 134/80, 139/81, 136/77, 135/80, 137/78 BP at goal in office today Hx of white coat syndrome Compliant with DASH diet and current med doses BP Readings from Last 3 Encounters:  02/25/24 136/78  01/28/24 (!) 142/63  10/02/23 (!) 142/80    Repeat BMP Maintain med doses F/up in 6months  Hypothyroidism Repeat TSH and T4 Maintain levothyroxine  dose  Hyperlipidemia associated with type 2 diabetes mellitus (HCC) Myalgia did not improve with discontinuation of statin Repeat lipid panel  DM (diabetes mellitus) (HCC) No adverse effects with ozempic  Normal foot exam BP at goal Requested eye exam report Improved UACr but not at goal, consider adding lisinopril  5mg  vs increase ozempic  dose? Repeat hgbA1c, lipid panel and BMP F/up in 3months  Myalgia No improvement with use of NSAIDs, muscle relaxant, and gabapentin . No improvement with discontinuation of statin 3months ago Normal vit. D, B12, folate, CK, Tsh, ANA, rh-factor, iron panel, CRP Mild elevation in Sed rate  Switch gabapentin  300mg  to lyrica  75mg  BID Entered referral to rheumatology F/up in 3months  Reviewed medical, surgical, and social history today  Medications: Outpatient Medications Prior to Visit  Medication Sig   albuterol  (VENTOLIN  HFA) 108 (90 Base) MCG/ACT inhaler Inhale 1-2 puffs into the lungs every 6 (six) hours as needed for wheezing or shortness of breath.   BIOTIN PO Take 1 tablet by mouth daily.   CALCIUM-VITAMIN D  PO Take 1  tablet by mouth daily.   docusate sodium  (COLACE) 100 MG capsule Take 100 mg by mouth every other day.   fluticasone (FLONASE) 50 MCG/ACT nasal spray Place 1 spray into both nostrils daily as needed for allergies or rhinitis.   latanoprost  (XALATAN ) 0.005 % ophthalmic solution Place 1 drop into both eyes every evening.   levothyroxine  (SYNTHROID ) 50 MCG tablet Take 1 tablet (50 mcg total) by mouth daily before breakfast.   MELATONIN PO Take 10 mg by mouth at bedtime.   Semaglutide ,0.25 or 0.5MG /DOS, (OZEMPIC , 0.25 OR 0.5 MG/DOSE,) 2 MG/3ML SOPN Inject 0.5 mg into the skin every 7 (seven) days.   tiZANidine  (ZANAFLEX ) 4 MG tablet Take 1 tablet (4 mg total) by mouth 2 (two) times daily as needed for muscle spasms.   [DISCONTINUED] amLODipine  (NORVASC ) 10 MG tablet Take 1 tablet (10 mg total) by mouth every evening.   [DISCONTINUED] carvedilol  (COREG ) 3.125 MG tablet Take 1 tablet (3.125 mg total) by mouth 2 (two) times daily.   [DISCONTINUED] gabapentin  (NEURONTIN ) 300 MG capsule Take 1 capsule (300 mg total) by mouth 2 (two) times daily.   No facility-administered medications prior to visit.   Reviewed past medical and social history.   ROS per HPI above      Objective:  BP 136/78 (BP Location: Left Arm, Patient Position: Sitting, Cuff Size: Large)   Pulse 90   Temp 97.8 F (36.6 C) (Oral)   Ht 5' 2 (1.575 m)   Wt 189 lb 9.6 oz (86 kg)   SpO2  98%   BMI 34.68 kg/m      Physical Exam Vitals and nursing note reviewed.  Constitutional:      Appearance: She is obese.  Cardiovascular:     Rate and Rhythm: Normal rate and regular rhythm.     Pulses: Normal pulses.          Dorsalis pedis pulses are 2+ on the right side and 2+ on the left side.       Posterior tibial pulses are 2+ on the right side and 2+ on the left side.     Heart sounds: Normal heart sounds.  Pulmonary:     Effort: Pulmonary effort is normal.     Breath sounds: Normal breath sounds.  Musculoskeletal:      Right lower leg: No edema.     Left lower leg: No edema.     Right foot: Normal range of motion. Deformity and prominent metatarsal heads present.     Left foot: Normal range of motion. No deformity or prominent metatarsal heads.  Feet:     Right foot:     Protective Sensation: 5 sites tested.  5 sites sensed.     Skin integrity: Skin integrity normal.     Toenail Condition: Right toenails are normal.     Left foot:     Protective Sensation: 5 sites tested.  5 sites sensed.     Skin integrity: Skin integrity normal.     Toenail Condition: Left toenails are normal.  Neurological:     Mental Status: She is alert and oriented to person, place, and time.  Psychiatric:        Mood and Affect: Mood normal.        Behavior: Behavior normal.        Thought Content: Thought content normal.     No results found for any visits on 02/25/24.    Assessment & Plan:    Problem List Items Addressed This Visit     DM (diabetes mellitus) (HCC)   No adverse effects with ozempic  Normal foot exam BP at goal Requested eye exam report Improved UACr but not at goal, consider adding lisinopril  5mg  vs increase ozempic  dose? Repeat hgbA1c, lipid panel and BMP F/up in 3months      Relevant Orders   Hemoglobin A1c   HTN (hypertension), benign - Primary    Home BP: 140/80, 136/79, 134/80, 139/81, 136/77, 135/80, 137/78 BP at goal in office today Hx of white coat syndrome Compliant with DASH diet and current med doses BP Readings from Last 3 Encounters:  02/25/24 136/78  01/28/24 (!) 142/63  10/02/23 (!) 142/80    Repeat BMP Maintain med doses F/up in 6months      Relevant Medications   amLODipine  (NORVASC ) 10 MG tablet   carvedilol  (COREG ) 3.125 MG tablet   Other Relevant Orders   Basic metabolic panel with GFR   Hyperlipidemia associated with type 2 diabetes mellitus (HCC)   Myalgia did not improve with discontinuation of statin Repeat lipid panel      Relevant Medications    amLODipine  (NORVASC ) 10 MG tablet   carvedilol  (COREG ) 3.125 MG tablet   Other Relevant Orders   Lipid panel   Hypothyroidism   Repeat TSH and T4 Maintain levothyroxine  dose      Relevant Medications   carvedilol  (COREG ) 3.125 MG tablet   Other Relevant Orders   T4, free   TSH   Myalgia   No improvement with use of NSAIDs, muscle relaxant, and  gabapentin . No improvement with discontinuation of statin 3months ago Normal vit. D, B12, folate, CK, Tsh, ANA, rh-factor, iron panel, CRP Mild elevation in Sed rate  Switch gabapentin  300mg  to lyrica  75mg  BID Entered referral to rheumatology F/up in 3months      Relevant Medications   pregabalin  (LYRICA ) 75 MG capsule   Other Relevant Orders   Ambulatory referral to Rheumatology   Return in about 3 months (around 05/27/2024) for HTN, DM, hyperlipidemia (fasting).     Roselie Mood, NP

## 2024-02-25 NOTE — Assessment & Plan Note (Addendum)
 No improvement with use of NSAIDs, muscle relaxant, and gabapentin . No improvement with discontinuation of statin 3months ago Normal vit. D, B12, folate, CK, Tsh, ANA, rh-factor, iron panel, CRP Mild elevation in Sed rate  Switch gabapentin  300mg  to lyrica  75mg  BID Entered referral to rheumatology F/up in 3months

## 2024-02-25 NOTE — Assessment & Plan Note (Signed)
 Home BP: 140/80, 136/79, 134/80, 139/81, 136/77, 135/80, 137/78 BP at goal in office today Hx of white coat syndrome Compliant with DASH diet and current med doses BP Readings from Last 3 Encounters:  02/25/24 136/78  01/28/24 (!) 142/63  10/02/23 (!) 142/80    Repeat BMP Maintain med doses F/up in 6months

## 2024-02-26 ENCOUNTER — Other Ambulatory Visit (HOSPITAL_COMMUNITY): Payer: Self-pay

## 2024-02-26 ENCOUNTER — Ambulatory Visit: Payer: Self-pay | Admitting: Nurse Practitioner

## 2024-02-26 ENCOUNTER — Other Ambulatory Visit: Payer: Self-pay

## 2024-02-26 DIAGNOSIS — E1169 Type 2 diabetes mellitus with other specified complication: Secondary | ICD-10-CM

## 2024-02-26 DIAGNOSIS — I10 Essential (primary) hypertension: Secondary | ICD-10-CM

## 2024-02-26 MED ORDER — LISINOPRIL 5 MG PO TABS
5.0000 mg | ORAL_TABLET | Freq: Every day | ORAL | 1 refills | Status: DC
  Filled 2024-02-26: qty 90, 90d supply, fill #0
  Filled 2024-05-16: qty 90, 90d supply, fill #1

## 2024-02-26 NOTE — Addendum Note (Signed)
 Addended by: KATHEEN ROSELIE CROME on: 02/26/2024 02:57 PM   Modules accepted: Orders

## 2024-03-05 ENCOUNTER — Other Ambulatory Visit: Payer: Self-pay

## 2024-03-05 ENCOUNTER — Other Ambulatory Visit (HOSPITAL_COMMUNITY): Payer: Self-pay

## 2024-03-05 MED ORDER — CHLORHEXIDINE GLUCONATE 0.12 % MT SOLN
15.0000 mL | Freq: Two times a day (BID) | OROMUCOSAL | 2 refills | Status: AC
Start: 2024-03-05 — End: ?
  Filled 2024-03-05: qty 473, 16d supply, fill #0
  Filled 2024-03-22: qty 473, 16d supply, fill #1
  Filled 2024-04-30: qty 473, 16d supply, fill #2

## 2024-03-09 ENCOUNTER — Ambulatory Visit (INDEPENDENT_AMBULATORY_CARE_PROVIDER_SITE_OTHER)

## 2024-03-09 ENCOUNTER — Ambulatory Visit (INDEPENDENT_AMBULATORY_CARE_PROVIDER_SITE_OTHER): Admitting: Podiatry

## 2024-03-09 ENCOUNTER — Other Ambulatory Visit (HOSPITAL_COMMUNITY): Payer: Self-pay

## 2024-03-09 ENCOUNTER — Other Ambulatory Visit: Payer: Self-pay

## 2024-03-09 DIAGNOSIS — M7741 Metatarsalgia, right foot: Secondary | ICD-10-CM

## 2024-03-09 DIAGNOSIS — M2031 Hallux varus (acquired), right foot: Secondary | ICD-10-CM

## 2024-03-09 DIAGNOSIS — Z9889 Other specified postprocedural states: Secondary | ICD-10-CM

## 2024-03-09 MED ORDER — DICLOFENAC SODIUM 1 % EX GEL
2.0000 g | Freq: Four times a day (QID) | CUTANEOUS | 2 refills | Status: AC
  Filled 2024-03-09: qty 100, 7d supply, fill #0
  Filled 2024-04-30: qty 100, 7d supply, fill #1
  Filled 2024-07-28: qty 100, 7d supply, fill #2

## 2024-03-10 NOTE — Progress Notes (Signed)
 Subjective:  Patient ID: Shelly Cohen, female    DOB: [birth year],  MRN: [id number]  Chief Complaint  Patient presents with   Foot Pain    Right foot pain  Pt stated that she has been having pain in the ball of her right foot for about 6 months she also stated that she has been having some swelling in her right great toe and it has been rubbing in her shoes     Discussed the use of AI scribe software for clinical note transcription with the patient, who gave verbal consent to proceed.  History of Present Illness Shelly Cohen is a 63 year old female who presents with soreness and achiness in the ball of the foot and toes.  She has experienced soreness and achiness in the ball of her foot and toes for approximately six months, with the achiness beginning around that time. The symptoms are localized to the ball of the foot and extend to the toes, with no known injury causing them.  She takes Lyrica  for nerve issues, which has improved tingling in the first two toes of both feet. She limits her use of ibuprofen  due to kidney function concerns and has recently finished using Voltaren  gel for her back.  She notices a slight imbalance and avoids wearing heels for extended periods, limiting her use to about one hour on Sundays. She typically wears flats to avoid discomfort.      Objective:  There were no vitals filed for this visit.  Physical Exam General: AAO x3, NAD  Dermatological: Skin is warm, dry and supple bilateral.  Incision from prior surgery well-healed.  There are no open sores, no preulcerative lesions, no rash or signs of infection present.  Vascular: Dorsalis Pedis artery and Posterior Tibial artery pedal pulses are 2/4 bilateral with immedate capillary fill time. There is no pain with calf compression, swelling, warmth, erythema.   Neruologic: Grossly intact via light touch bilateral.   Musculoskeletal: No significant pain along the previous first MTPJ  arthrodesis site.  Hallux malleus is present with decreased range of motion of the IPJ.  She does get discomfort submetatarsal area on the right foot.  There is no area of pinpoint tenderness.  No edema, erythema.  MMT 5/5.  Gait: Unassisted, Nonantalgic.     No images are attached to the encounter.    Results LABS Kidney function: Normal (02/25/2024)  RADIOLOGY Foot X-ray: Plates and screws in good position, interphalangeal joint of the big toe shows arthritis and hallux malleus.  No evidence of acute fracture otherwise.    Assessment:   1. Metatarsalgia, right foot   2. Hallux malleus, right      Plan:  Patient was evaluated and treated and all questions answered.  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 Condition exacerbated by walking patterns and pressure points with loss of fat pad cushioning. Limited use of ibuprofen  due to kidney concerns, but kidney function normal at this time. - Prescribed Voltaren  gel for topical application.  Discussed that she get this over-the-counter if needed. - Provided gel pads and cushions for pressure relief. - Referred to [facility] for custom shoe inserts. - Consider future steroid injection if needed.  Status post right foot surgery with retained hardware No movement or change in position of hardware from previous surgery. X-rays confirmed plates and screws  in good position.   Return for orthotics with [facility] .   Donnice JONELLE Fees DPM

## 2024-03-22 ENCOUNTER — Other Ambulatory Visit (HOSPITAL_COMMUNITY): Payer: Self-pay

## 2024-03-23 ENCOUNTER — Other Ambulatory Visit: Payer: Self-pay

## 2024-03-23 ENCOUNTER — Encounter: Payer: Self-pay | Admitting: Nurse Practitioner

## 2024-03-24 ENCOUNTER — Other Ambulatory Visit: Payer: Self-pay

## 2024-03-25 ENCOUNTER — Telehealth: Payer: Self-pay | Admitting: Nurse Practitioner

## 2024-03-25 NOTE — Telephone Encounter (Signed)
 Patient dropped off document Handicap Placard, to be filled out by provider. Patient requested to send it back via Call Patient to pick up within 7-days. Document is located in providers tray at front office.Please advise at Mobile [phone] (mobile)

## 2024-03-27 DIAGNOSIS — Z0279 Encounter for issue of other medical certificate: Secondary | ICD-10-CM

## 2024-03-27 NOTE — Telephone Encounter (Signed)
 CLINICAL USE BELOW THIS LINE (use X to signify taken)  _X___Form received and placed in providers office for signature. _X___Form completed and left at front office. _X___Form completed & LVM to notify pt ready for pick up _X___Charge sheet & copy of form in front office folder for office supervisor.

## 2024-03-27 NOTE — Telephone Encounter (Signed)
 Called and left a voice message for patient per DPR on file that her form was completed that she can pick up when she is able.

## 2024-04-02 ENCOUNTER — Other Ambulatory Visit: Payer: Self-pay

## 2024-04-02 ENCOUNTER — Ambulatory Visit (INDEPENDENT_AMBULATORY_CARE_PROVIDER_SITE_OTHER): Admitting: Nurse Practitioner

## 2024-04-02 ENCOUNTER — Emergency Department (HOSPITAL_COMMUNITY)
Admission: EM | Admit: 2024-04-02 | Discharge: 2024-04-02 | Disposition: A | Discharge status: Home / Self Care | Attending: Emergency Medicine | Admitting: Emergency Medicine

## 2024-04-02 ENCOUNTER — Encounter (HOSPITAL_COMMUNITY): Payer: Self-pay

## 2024-04-02 ENCOUNTER — Encounter: Payer: Self-pay | Admitting: Nurse Practitioner

## 2024-04-02 ENCOUNTER — Emergency Department (HOSPITAL_COMMUNITY)

## 2024-04-02 ENCOUNTER — Other Ambulatory Visit

## 2024-04-02 VITALS — BP 136/72 | HR 80 | Temp 96.2°F | Wt 187.2 lb

## 2024-04-02 DIAGNOSIS — R200 Anesthesia of skin: Secondary | ICD-10-CM

## 2024-04-02 DIAGNOSIS — R4701 Aphasia: Secondary | ICD-10-CM

## 2024-04-02 DIAGNOSIS — R202 Paresthesia of skin: Secondary | ICD-10-CM

## 2024-04-02 DIAGNOSIS — R9082 White matter disease, unspecified: Secondary | ICD-10-CM | POA: Diagnosis not present

## 2024-04-02 DIAGNOSIS — Z79899 Other long term (current) drug therapy: Secondary | ICD-10-CM | POA: Diagnosis not present

## 2024-04-02 DIAGNOSIS — I10 Essential (primary) hypertension: Secondary | ICD-10-CM | POA: Diagnosis not present

## 2024-04-02 DIAGNOSIS — R000 Tachycardia, unspecified: Secondary | ICD-10-CM | POA: Diagnosis not present

## 2024-04-02 DIAGNOSIS — Z7984 Long term (current) use of oral hypoglycemic drugs: Secondary | ICD-10-CM | POA: Insufficient documentation

## 2024-04-02 DIAGNOSIS — R531 Weakness: Secondary | ICD-10-CM | POA: Diagnosis not present

## 2024-04-02 DIAGNOSIS — G8194 Hemiplegia, unspecified affecting left nondominant side: Secondary | ICD-10-CM

## 2024-04-02 DIAGNOSIS — R29818 Other symptoms and signs involving the nervous system: Secondary | ICD-10-CM | POA: Diagnosis not present

## 2024-04-02 DIAGNOSIS — E119 Type 2 diabetes mellitus without complications: Secondary | ICD-10-CM | POA: Diagnosis not present

## 2024-04-02 LAB — COMPREHENSIVE METABOLIC PANEL WITH GFR
ALT: 12 U/L (ref 0–44)
AST: 17 U/L (ref 15–41)
Albumin: 3.5 g/dL (ref 3.5–5.0)
Alkaline Phosphatase: 72 U/L (ref 38–126)
Anion gap: 13 (ref 5–15)
BUN: 14 mg/dL (ref 8–23)
CO2: 24 mmol/L (ref 22–32)
Calcium: 9.5 mg/dL (ref 8.9–10.3)
Chloride: 104 mmol/L (ref 98–111)
Creatinine, Ser: 0.75 mg/dL (ref 0.44–1.00)
GFR, Estimated: >60 mL/min (ref >60)
Glucose, Bld: 100 mg/dL — ABNORMAL HIGH (ref 70–99)
Potassium: 3.7 mmol/L (ref 3.5–5.1)
Sodium: 141 mmol/L (ref 135–145)
Total Bilirubin: 0.5 mg/dL (ref 0.0–1.2)
Total Protein: 7.3 g/dL (ref 6.5–8.1)

## 2024-04-02 LAB — CBC
HCT: 40.1 % (ref 36.0–46.0)
Hemoglobin: 12.5 g/dL (ref 12.0–15.0)
MCH: 25.9 pg — ABNORMAL LOW (ref 26.0–34.0)
MCHC: 31.2 g/dL (ref 30.0–36.0)
MCV: 83 fL (ref 80.0–100.0)
Platelets: 266 K/uL (ref 150–400)
RBC: 4.83 MIL/uL (ref 3.87–5.11)
RDW: 15.5 % (ref 11.5–15.5)
WBC: 9.2 K/uL (ref 4.0–10.5)
nRBC: 0 % (ref 0.0–0.2)

## 2024-04-02 LAB — GLUCOSE, CAPILLARY: Glucose-Capillary: 105 mg/dL — ABNORMAL HIGH (ref 70–99)

## 2024-04-02 LAB — DIFFERENTIAL
Abs Immature Granulocytes: 0.02 K/uL (ref 0.00–0.07)
Basophils Absolute: 0 K/uL (ref 0.0–0.1)
Basophils Relative: 0 %
Eosinophils Absolute: 0.3 K/uL (ref 0.0–0.5)
Eosinophils Relative: 3 %
Immature Granulocytes: 0 %
Lymphocytes Relative: 35 %
Lymphs Abs: 3.2 K/uL (ref 0.7–4.0)
Monocytes Absolute: 0.6 K/uL (ref 0.1–1.0)
Monocytes Relative: 7 %
Neutro Abs: 5.1 K/uL (ref 1.7–7.7)
Neutrophils Relative %: 55 %

## 2024-04-02 LAB — I-STAT CHEM 8, ED
BUN: 13 mg/dL (ref 8–23)
Calcium, Ion: 1.21 mmol/L (ref 1.15–1.40)
Chloride: 107 mmol/L (ref 98–111)
Creatinine, Ser: 0.8 mg/dL (ref 0.44–1.00)
Glucose, Bld: 99 mg/dL (ref 70–99)
HCT: 40 % (ref 36.0–46.0)
Hemoglobin: 13.6 g/dL (ref 12.0–15.0)
Potassium: 3.8 mmol/L (ref 3.5–5.1)
Sodium: 142 mmol/L (ref 135–145)
TCO2: 25 mmol/L (ref 22–32)

## 2024-04-02 LAB — APTT: aPTT: 33 s (ref 24–36)

## 2024-04-02 LAB — PROTIME-INR
INR: 1 (ref 0.8–1.2)
Prothrombin Time: 13.4 s (ref 11.4–15.2)

## 2024-04-02 LAB — ETHANOL: Alcohol, Ethyl (B): <15 mg/dL (ref <15)

## 2024-04-02 MED ORDER — ASPIRIN 81 MG PO TBEC: 81.0000 mg | DELAYED_RELEASE_TABLET | Freq: Every day | ORAL | Status: AC

## 2024-04-02 NOTE — ED Triage Notes (Signed)
 Pt arrives via [facility] as a code stroke activated by ems. Pt reports she went to bed at 2200 feeling normal but then around 0115 she woke up with numbness to left side of her face, left arm and left leg as well as had some aphasia. No hx of stroke. A&OX4.   BP- 184/82, HR-82, 99% RA, CBG-99

## 2024-04-02 NOTE — Consult Note (Signed)
 NEUROLOGY CONSULT NOTE   Date of service: April 02, 2024 Patient Name: Shelly Cohen MRN:  [id number] DOB:  1961-03-27 Chief Complaint: Aphasia, L hemiplegia Requesting Provider: No att. providers found  History of Present Illness  Shelly Cohen is a 63 y.o. female with hx of chronic pain, R hip replacement, HTN, DM, HLD who presents as a code stroke for aphasia and left hemiplegia.  Patient reports going to sleep around 10pm. She woke up around 1am because she felt odd. Described it as numb on her entire left side as well as her right leg. She tried to sleep it off but wasn't able to. She then woke up and walked over to her husband to call 911. When EMS arrived, they noted word finding difficulty. Currently, reports no longer feeling numb in the right leg and that numbness of left side was improving as well as word finding difficulty.   LKW: 04/01/24 @ 10pm Modified rankin score: 0 IV Thrombolysis: No (reason) EVT: No (too mild to treat) ICH Score: N/A  NIHSS components Score: Comment  1a Level of Conscious 0[x]  1[]  2[]  3[]      1b LOC Questions 0[x]  1[]  2[]       1c LOC Commands 0[x]  1[]  2[]       2 Best Gaze 0[x]  1[]  2[]       3 Visual 0[x]  1[]  2[]  3[]      4 Facial Palsy 0[x]  1[]  2[]  3[]      5a Motor Arm - left 0[x]  1[]  2[]  3[]  4[]  UN[]    5b Motor Arm - Right 0[x]  1[]  2[]  3[]  4[]  UN[]    6a Motor Leg - Left 0[]  1[]  2[x]  3[]  4[]  UN[]    6b Motor Leg - Right 0[]  1[]  2[x]  3[]  4[]  UN[]    7 Limb Ataxia 0[x]  1[]  2[]  UN[]      8 Sensory 0[]  1[x]  2[]  UN[]      9 Best Language 0[x]  1[]  2[]  3[]      10 Dysarthria 0[x]  1[]  2[]  UN[]      11 Extinct. and Inattention 0[x]  1[]  2[]       TOTAL: 5       ROS  Comprehensive ROS performed and pertinent positives documented in HPI   Past History   Past Medical History:  Diagnosis Date   Allergy    Alpha thalassemia trait    Arthritis    hips   Fibroadenoma    Glaucoma    monitored every 6months, right eye worse than left    History  of positive PPD, treatment status unknown    Hypertension    OSA (obstructive sleep apnea)    mild with AHI 8/hr   Pre-diabetes    Thyroid  disease     Past Surgical History:  Procedure Laterality Date   ABDOMINAL HYSTERECTOMY  2013   BREAST BIOPSY  2013   BUNIONECTOMY  10/2014   CHOLECYSTECTOMY  2015   COLONOSCOPY     hallux limi Right 2019   great  toe revision   JOINT REPLACEMENT  08/31/2017   right hip   TOTAL HIP ARTHROPLASTY Right 09/12/2018   Procedure: RIGHT TOTAL HIP ARTHROPLASTY ANTERIOR APPROACH;  Surgeon: Vernetta Lonni GRADE, MD;  Location: [facility];  Service: Orthopedics;  Laterality: Right;   TUBAL LIGATION  1990   UPPER GI ENDOSCOPY N/A 02/20/2021   Procedure: UPPER GI ENDOSCOPY;  Surgeon: Tanda Locus, MD;  Location: [facility];  Service: General;  Laterality: N/A;    Family History: Family History  Problem Relation Age of Onset   Hypertension  Mother    Anemia Mother    Arthritis Mother        rheumatoid arthritis   Kidney disease Mother    Cataracts Mother    Heart disease Mother    Colon polyps Sister    Hypertension Sister    Cataracts Sister    Colon polyps Brother    Hypertension Brother    Glaucoma Brother    Diabetes Brother    Glaucoma Maternal Aunt    Cancer Maternal Grandmother        leukemia   Cancer Maternal Grandfather        lung   Asthma Maternal Aunt    COPD Maternal Aunt    Diabetes Maternal Aunt    Diabetes Brother    Heart disease Brother    Hypertension Brother    Vision loss Brother    Asthma Maternal Aunt    COPD Maternal Aunt    Diabetes Maternal Aunt    Colon cancer Neg Hx    Esophageal cancer Neg Hx    Rectal cancer Neg Hx    Stomach cancer Neg Hx     Social History  reports that she has never smoked. She has never used smokeless tobacco. She reports that she does not drink alcohol and does not use drugs.  Allergies  Allergen Reactions   Dilaudid  [Hydromorphone  Hcl] Itching   Diphenhydramine  Hcl Itching     hallucination    Hydrocodone  Nausea Only   Oxycodone  Nausea And Vomiting   Azithromycin Itching and Rash   Cephalexin Rash   Penicillin G Rash    Did it involve swelling of the face/tongue/throat, SOB, or low BP? No Did it involve sudden or severe rash/hives, skin peeling, or any reaction on the inside of your mouth or nose? No Did you need to seek medical attention at a hospital or doctor's office? No When did it last happen?      childhood If all above answers are "NO", may proceed with cephalosporin use.    Zolpidem  Itching and Rash    Medications  No current facility-administered medications for this encounter.  Current Outpatient Medications:    albuterol  (VENTOLIN  HFA) 108 (90 Base) MCG/ACT inhaler, Inhale 1-2 puffs into the lungs every 6 (six) hours as needed for wheezing or shortness of breath., Disp: 6.7 g, Rfl: 0   amLODipine  (NORVASC ) 10 MG tablet, Take 1 tablet (10 mg total) by mouth every evening., Disp: 90 tablet, Rfl: 3   BIOTIN PO, Take 1 tablet by mouth daily., Disp: , Rfl:    CALCIUM-VITAMIN D  PO, Take 1 tablet by mouth daily., Disp: , Rfl:    carvedilol  (COREG ) 3.125 MG tablet, Take 1 tablet (3.125 mg total) by mouth 2 (two) times daily., Disp: 180 tablet, Rfl: 3   chlorhexidine  (PERIDEX ) 0.12 % solution, Swish 15 mL in mouth for 30 seconds then spit out, use twice daily., Disp: 473 mL, Rfl: 2   diclofenac  Sodium (VOLTAREN ) 1 % GEL, Apply 2 g topically 4 (four) times daily. Rub into affected area of foot 2 to 4 times daily, Disp: 100 g, Rfl: 2   docusate sodium  (COLACE) 100 MG capsule, Take 100 mg by mouth every other day., Disp: , Rfl:    fluticasone (FLONASE) 50 MCG/ACT nasal spray, Place 1 spray into both nostrils daily as needed for allergies or rhinitis., Disp: , Rfl:    latanoprost  (XALATAN ) 0.005 % ophthalmic solution, Place 1 drop into both eyes every evening., Disp: 5 mL, Rfl: 6  levothyroxine  (SYNTHROID ) 50 MCG tablet, Take 1 tablet (50 mcg total) by mouth  daily before breakfast., Disp: 90 tablet, Rfl: 1   lisinopril  (ZESTRIL ) 5 MG tablet, Take 1 tablet (5 mg total) by mouth daily., Disp: 90 tablet, Rfl: 1   MELATONIN PO, Take 10 mg by mouth at bedtime., Disp: , Rfl:    pregabalin  (LYRICA ) 75 MG capsule, Take 1 capsule (75 mg total) by mouth 2 (two) times daily., Disp: 60 capsule, Rfl: 5   Semaglutide ,0.25 or 0.5MG /DOS, (OZEMPIC , 0.25 OR 0.5 MG/DOSE,) 2 MG/3ML SOPN, Inject 0.5 mg into the skin every 7 (seven) days., Disp: 9 mL, Rfl: 0   tiZANidine  (ZANAFLEX ) 4 MG tablet, Take 1 tablet (4 mg total) by mouth 2 (two) times daily as needed for muscle spasms., Disp: 60 tablet, Rfl: 1  Vitals   Vitals:   08-Apr-2024 0530 04-08-2024 0545 2024-04-08 0600 2024-04-08 0615  BP: 137/81 130/73 139/83 136/78  Pulse: 86 82 80 87  Resp: (!) 8 16 (!) 21 19  Temp:    98.2 F (36.8 C)  TempSrc:    Oral  SpO2: 100% 100% 99% 100%  Weight:        Body mass index is 35.24 kg/m.   Physical Exam   Constitutional: Appears well-developed and well-nourished.  Psych: Affect appropriate to situation.  Eyes: No scleral injection.  HENT: No OP obstruction.  Head: Normocephalic.  Cardiovascular: Normal rate and regular rhythm.  Respiratory: Effort normal, non-labored breathing.  GI: Soft.   Skin: WDI.   Neurologic Examination   Mental status: alert, oriented to person, place and time. Able to provide history. Speech: no dysarthria or paraphasic errors. Initially with word-finding difficulty that improved and resolved with conversation. Cranial nerves: PERRL EOMI VF full Face sensation intact bilaterally. Face symmetric at rest and with activation. With smile, noted to have trembling in the cheeks.  Hearing grossly intact. Palate elevation symmetric Tongue protrudes midline although is tremulous and does not protrude all the way. There is full range of motion in both directions.  SCM's full strength bilaterally. Motor: Normal bulk and tone. No abnormal  movements RUE: shoulder abduction 5/5, biceps 5/5, triceps 5/5, hand grip 5/5 LUE: shoulder abduction 5/5, biceps 5/5, triceps 5/5, hand grip 5/5 RLE: hip flexion 4/5, ankle dorsiflexion 5/5, plantar flexion 5/5 LLE: hip flexion 4/5, ankle dorsiflexion 5/5, plantar flexion 5/5 *Upper extremities initially with limited contraction of muscles, however with coaxing became 5.5 *Positive Hoover sign with LLE raise *RLE exam limited by pain Sensory: Diminished sensation to light touch on left limbs.  Reflexes: DTR's 2+. Downgoing toes bilaterally. Coordination: FTN intact. HTS intact. Gait: deferred    Labs/Imaging/Neurodiagnostic studies   CBC:  Recent Labs  Lab 04/08/24 0207 2024/04/08 0208  WBC 9.2  --   NEUTROABS 5.1  --   HGB 12.5 13.6  HCT 40.1 40.0  MCV 83.0  --   PLT 266  --    Basic Metabolic Panel:  Lab Results  Component Value Date   NA 142 04/08/2024   K 3.8 04/08/2024   CO2 24 04/08/24   GLUCOSE 99 04/08/2024   BUN 13 Apr 08, 2024   CREATININE 0.80 2024/04/08   CALCIUM 9.5 04-08-2024   GFRNONAA >60 Apr 08, 2024   GFRAA 84 10/06/2018   Lipid Panel:  Lab Results  Component Value Date   LDLCALC 102 (H) 02/25/2024   HgbA1c:  Lab Results  Component Value Date   HGBA1C 5.9 02/25/2024   Urine Drug Screen: No results found for:  LABOPIA, COCAINSCRNUR, LABBENZ, AMPHETMU, THCU, LABBARB  Alcohol Level     Component Value Date/Time   [facility] <15 04/02/2024 0207   INR  Lab Results  Component Value Date   INR 1.0 04/02/2024   APTT  Lab Results  Component Value Date   APTT 33 04/02/2024     CT Head without contrast(Personally reviewed): No acute abnormality.  MRI Brain(Personally reviewed): No acute abnormality.  ASSESSMENT   Truth Wolaver is a 63 y.o. female with PMH of chronic pain, R hip replacement, HTN, DM, HLD who presents as a code stroke for aphasia and left hemiplegia. Upon further interview, she had bilateral symptoms (both  legs with numbness). Presentation not consistent with one vascular territory. Exam also very effort-dependent concerning for functional overlay. It appears she has been having poorly controlled right hip pain.  RECOMMENDATIONS  - MRI brain to evaluate for stroke  ______________________________________________________________________    Signed, Normie CHRISTELLA Blower, MD [facility]

## 2024-04-02 NOTE — Progress Notes (Signed)
 Established Patient Visit  Patient: Shelly Cohen   DOB: 04-21-1961   63 y.o. Female  MRN: [id number] Visit Date: 04/02/2024  Subjective:    Chief Complaint  Patient presents with   Medical Management of Chronic Issues    Patient presents today stating she went to the ER for stroke-like symptoms. Patient states it began today at 1 am. Symptoms included numbness on left side of body. Happened while she was asleep and there was some tingling in her left sid eof her body and a little on her right, but her right side tingling subsided. Pt state she got up about 3 times to see to if the symptoms would go away. She didn't recognize any pain. States she didn't feel like herself, she felt very strange and abnormal.   HPI Paresthesia She woke up last night with left side of face of left side of body numbness. This was associated with aphasia and left side weakness. This led to an ED visit last night. She took 2tabs ASA 81mg  prior to EMS arrival to her home last night. I reviewed ED lab results results and imaging reports-CT and MRI brain indicated no acute finding. Lyrica  75mg  BID and zanaflex  4mg  BID was prescribed within the last 67month to manage chronic left hip pain. Today she reports persistent numbness of left side of face and dizziness. Resolved left side of body weakness and numbness.  Normal neuro exam, but appears fatigued and drowsy. She os alert and oriented. She follows all commends without hesitation. She is the exam room alone, but Her husband drove her to office today. I suspect symptoms may be related to med side effects Advised to stop zanaflex , decrease lyrica  dose to 75mg  in PM only, and start ASA 81mg  EC daily. Entered urgent neurology referral. F/up in 1week if not seen by neurology Provided ED precautions  Reviewed medical, surgical, and social history today  Medications: Outpatient Medications Prior to Visit  Medication Sig   albuterol  (VENTOLIN  HFA)  108 (90 Base) MCG/ACT inhaler Inhale 1-2 puffs into the lungs every 6 (six) hours as needed for wheezing or shortness of breath.   amLODipine  (NORVASC ) 10 MG tablet Take 1 tablet (10 mg total) by mouth every evening.   BIOTIN PO Take 1 tablet by mouth daily.   CALCIUM-VITAMIN D  PO Take 1 tablet by mouth daily.   carvedilol  (COREG ) 3.125 MG tablet Take 1 tablet (3.125 mg total) by mouth 2 (two) times daily.   chlorhexidine  (PERIDEX ) 0.12 % solution Swish 15 mL in mouth for 30 seconds then spit out, use twice daily.   diclofenac  Sodium (VOLTAREN ) 1 % GEL Apply 2 g topically 4 (four) times daily. Rub into affected area of foot 2 to 4 times daily   docusate sodium  (COLACE) 100 MG capsule Take 100 mg by mouth every other day.   fluticasone (FLONASE) 50 MCG/ACT nasal spray Place 1 spray into both nostrils daily as needed for allergies or rhinitis.   latanoprost  (XALATAN ) 0.005 % ophthalmic solution Place 1 drop into both eyes every evening.   levothyroxine  (SYNTHROID ) 50 MCG tablet Take 1 tablet (50 mcg total) by mouth daily before breakfast.   lisinopril  (ZESTRIL ) 5 MG tablet Take 1 tablet (5 mg total) by mouth daily.   MELATONIN PO Take 10 mg by mouth at bedtime.   pregabalin  (LYRICA ) 75 MG capsule Take 1 capsule (75 mg total) by mouth 2 (two) times daily.  Semaglutide ,0.25 or 0.5MG /DOS, (OZEMPIC , 0.25 OR 0.5 MG/DOSE,) 2 MG/3ML SOPN Inject 0.5 mg into the skin every 7 (seven) days.   tiZANidine  (ZANAFLEX ) 4 MG tablet Take 1 tablet (4 mg total) by mouth 2 (two) times daily as needed for muscle spasms.   No facility-administered medications prior to visit.   Reviewed past medical and social history.   ROS per HPI above  Last CBC Lab Results  Component Value Date   WBC 9.2 04/02/2024   HGB 13.6 04/02/2024   HCT 40.0 04/02/2024   MCV 83.0 04/02/2024   MCH 25.9 (L) 04/02/2024   RDW 15.5 04/02/2024   PLT 266 04/02/2024   Last metabolic panel Lab Results  Component Value Date   GLUCOSE 99  04/02/2024   NA 142 04/02/2024   K 3.8 04/02/2024   CL 107 04/02/2024   CO2 24 04/02/2024   BUN 13 04/02/2024   CREATININE 0.80 04/02/2024   GFRNONAA >60 04/02/2024   CALCIUM 9.5 04/02/2024   PHOS 3.5 08/08/2022   PROT 7.3 04/02/2024   ALBUMIN 3.5 04/02/2024   BILITOT 0.5 04/02/2024   ALKPHOS 72 04/02/2024   AST 17 04/02/2024   ALT 12 04/02/2024   ANIONGAP 13 04/02/2024   Last lipids Lab Results  Component Value Date   CHOL 205 (H) 02/25/2024   HDL 87.30 02/25/2024   LDLCALC 102 (H) 02/25/2024   TRIG 78.0 02/25/2024   CHOLHDL 2 02/25/2024   Last hemoglobin A1c Lab Results  Component Value Date   HGBA1C 5.9 02/25/2024   Last thyroid  functions Lab Results  Component Value Date   TSH 1.86 02/25/2024   T4TOTAL 9.9 02/27/2021   Last vitamin D  Lab Results  Component Value Date   VD25OH 51.93 04/05/2022   Last vitamin B12 and Folate Lab Results  Component Value Date   VITAMINB12 >1537 (H) 08/27/2023   FOLATE >23.9 04/05/2022        Objective:  BP 136/72 (BP Location: Right Arm, Patient Position: Sitting)   Pulse 80   Temp (!) 96.2 F (35.7 C) (Temporal)   Wt 187 lb 3.2 oz (84.9 kg)   SpO2 99%   BMI 34.24 kg/m      Physical Exam Vitals and nursing note reviewed.  Eyes:     General: No visual field deficit. Cardiovascular:     Rate and Rhythm: Normal rate and regular rhythm.     Pulses: Normal pulses.     Heart sounds: Normal heart sounds.  Pulmonary:     Effort: Pulmonary effort is normal.     Breath sounds: Normal breath sounds.  Musculoskeletal:     Cervical back: Normal range of motion and neck supple.     Right lower leg: No edema.     Left lower leg: No edema.  Lymphadenopathy:     Cervical: No cervical adenopathy.  Neurological:     Mental Status: She is oriented to person, place, and time. She is lethargic.     Cranial Nerves: No cranial nerve deficit, dysarthria or facial asymmetry.     Motor: No weakness.     Coordination:  Coordination is intact.     Gait: Gait is intact.     Deep Tendon Reflexes:     Reflex Scores:      Bicep reflexes are 2+ on the right side and 2+ on the left side.      Brachioradialis reflexes are 2+ on the right side and 2+ on the left side.    Results for  orders placed or performed during the hospital encounter of 04/02/24  Ethanol  Result Value Ref Range   Alcohol, Ethyl (B) <15 <15 mg/dL  Protime-INR  Result Value Ref Range   Prothrombin Time 13.4 11.4 - 15.2 seconds   INR 1.0 0.8 - 1.2  APTT  Result Value Ref Range   aPTT 33 24 - 36 seconds  CBC  Result Value Ref Range   WBC 9.2 4.0 - 10.5 K/uL   RBC 4.83 3.87 - 5.11 MIL/uL   Hemoglobin 12.5 12.0 - 15.0 g/dL   HCT 59.8 63.9 - 53.9 %   MCV 83.0 80.0 - 100.0 fL   MCH 25.9 (L) 26.0 - 34.0 pg   MCHC 31.2 30.0 - 36.0 g/dL   RDW 84.4 88.4 - 84.4 %   Platelets 266 150 - 400 K/uL   nRBC 0.0 0.0 - 0.2 %  Differential  Result Value Ref Range   Neutrophils Relative % 55 %   Neutro Abs 5.1 1.7 - 7.7 K/uL   Lymphocytes Relative 35 %   Lymphs Abs 3.2 0.7 - 4.0 K/uL   Monocytes Relative 7 %   Monocytes Absolute 0.6 0.1 - 1.0 K/uL   Eosinophils Relative 3 %   Eosinophils Absolute 0.3 0.0 - 0.5 K/uL   Basophils Relative 0 %   Basophils Absolute 0.0 0.0 - 0.1 K/uL   Immature Granulocytes 0 %   Abs Immature Granulocytes 0.02 0.00 - 0.07 K/uL  Comprehensive metabolic panel  Result Value Ref Range   Sodium 141 135 - 145 mmol/L   Potassium 3.7 3.5 - 5.1 mmol/L   Chloride 104 98 - 111 mmol/L   CO2 24 22 - 32 mmol/L   Glucose, Bld 100 (H) 70 - 99 mg/dL   BUN 14 8 - 23 mg/dL   Creatinine, Ser 9.24 0.44 - 1.00 mg/dL   Calcium 9.5 8.9 - 89.6 mg/dL   Total Protein 7.3 6.5 - 8.1 g/dL   Albumin 3.5 3.5 - 5.0 g/dL   AST 17 15 - 41 U/L   ALT 12 0 - 44 U/L   Alkaline Phosphatase 72 38 - 126 U/L   Total Bilirubin 0.5 0.0 - 1.2 mg/dL   GFR, Estimated >39 >39 mL/min   Anion gap 13 5 - 15  Glucose, capillary  Result Value Ref Range    Glucose-Capillary 105 (H) 70 - 99 mg/dL  I-stat chem 8, ED  Result Value Ref Range   Sodium 142 135 - 145 mmol/L   Potassium 3.8 3.5 - 5.1 mmol/L   Chloride 107 98 - 111 mmol/L   BUN 13 8 - 23 mg/dL   Creatinine, Ser 9.19 0.44 - 1.00 mg/dL   Glucose, Bld 99 70 - 99 mg/dL   Calcium, Ion 8.78 8.84 - 1.40 mmol/L   TCO2 25 22 - 32 mmol/L   Hemoglobin 13.6 12.0 - 15.0 g/dL   HCT 59.9 63.9 - 53.9 %      Assessment & Plan:    Problem List Items Addressed This Visit     Paresthesia - Primary   She woke up last night with left side of face of left side of body numbness. This was associated with aphasia and left side weakness. This led to an ED visit last night. She took 2tabs ASA 81mg  prior to EMS arrival to her home last night. I reviewed ED lab results results and imaging reports-CT and MRI brain indicated no acute finding. Lyrica  75mg  BID and zanaflex  4mg  BID was  prescribed within the last 9month to manage chronic left hip pain. Today she reports persistent numbness of left side of face and dizziness. Resolved left side of body weakness and numbness.  Normal neuro exam, but appears fatigued and drowsy. She os alert and oriented. She follows all commends without hesitation. She is the exam room alone, but Her husband drove her to office today. I suspect symptoms may be related to med side effects Advised to stop zanaflex , decrease lyrica  dose to 75mg  in PM only, and start ASA 81mg  EC daily. Entered urgent neurology referral. F/up in 1week if not seen by neurology Provided ED precautions      Relevant Medications   aspirin  EC 81 MG tablet   Other Relevant Orders   Ambulatory referral to Neurology   Return in about 1 week (around 04/09/2024) for parestheisia.     Roselie Mood, NP

## 2024-04-02 NOTE — ED Provider Notes (Signed)
[place] EMERGENCY DEPARTMENT AT [facility] Provider Note   CSN: [id number] Arrival date & time: 04/02/24  0201  An emergency department physician performed an initial assessment on this suspected stroke patient at 0201.  Patient presents with: Code Stroke   Shelly Cohen is a 63 y.o. female.    Cerebrovascular Accident This is a new problem. The current episode started 3 to 5 hours ago. The problem occurs constantly. The problem has not changed since onset.Pertinent negatives include no chest pain, no abdominal pain, no headaches and no shortness of breath. Nothing aggravates the symptoms. Nothing relieves the symptoms. She has tried nothing for the symptoms. The treatment provided no relief.   Patient has facial numbness last seen normal at 10 pm.      Prior to Admission medications   Medication Sig Start Date End Date Taking? Authorizing Provider  albuterol  (VENTOLIN  HFA) 108 (90 Base) MCG/ACT inhaler Inhale 1-2 puffs into the lungs every 6 (six) hours as needed for wheezing or shortness of breath. 06/07/22   Nche, Roselie Rockford, NP  amLODipine  (NORVASC ) 10 MG tablet Take 1 tablet (10 mg total) by mouth every evening. 02/25/24   Nche, Roselie Rockford, NP  BIOTIN PO Take 1 tablet by mouth daily.    [provider]  CALCIUM-VITAMIN D  PO Take 1 tablet by mouth daily.    [provider]  carvedilol  (COREG ) 3.125 MG tablet Take 1 tablet (3.125 mg total) by mouth 2 (two) times daily. 02/25/24   Nche, Roselie Rockford, NP  chlorhexidine  (PERIDEX ) 0.12 % solution Swish 15 mL in mouth for 30 seconds then spit out, use twice daily. 03/05/24   Clenton Oliva BIRCH, DMD  diclofenac  Sodium (VOLTAREN ) 1 % GEL Apply 2 g topically 4 (four) times daily. Rub into affected area of foot 2 to 4 times daily 03/09/24   Gershon Donnice SAUNDERS, DPM  docusate sodium  (COLACE) 100 MG capsule Take 100 mg by mouth every other day.    [provider]  fluticasone (FLONASE) 50 MCG/ACT nasal  spray Place 1 spray into both nostrils daily as needed for allergies or rhinitis.    [provider]  latanoprost  (XALATAN ) 0.005 % ophthalmic solution Place 1 drop into both eyes every evening. 10/26/22     levothyroxine  (SYNTHROID ) 50 MCG tablet Take 1 tablet (50 mcg total) by mouth daily before breakfast. 01/28/24   Nche, Roselie Rockford, NP  lisinopril  (ZESTRIL ) 5 MG tablet Take 1 tablet (5 mg total) by mouth daily. 02/26/24   Nche, Roselie Rockford, NP  MELATONIN PO Take 10 mg by mouth at bedtime.    [provider]  pregabalin  (LYRICA ) 75 MG capsule Take 1 capsule (75 mg total) by mouth 2 (two) times daily. 02/25/24   Nche, Roselie Rockford, NP  Semaglutide ,0.25 or 0.5MG /DOS, (OZEMPIC , 0.25 OR 0.5 MG/DOSE,) 2 MG/3ML SOPN Inject 0.5 mg into the skin every 7 (seven) days. 02/17/24   Nche, Roselie Rockford, NP  tiZANidine  (ZANAFLEX ) 4 MG tablet Take 1 tablet (4 mg total) by mouth 2 (two) times daily as needed for muscle spasms. 02/05/24   Vernetta Lonni GRADE, MD    Allergies: Dilaudid  [hydromorphone  hcl], Diphenhydramine  hcl, Hydrocodone , Oxycodone , Azithromycin, Cephalexin, Penicillin g, and Zolpidem     Review of Systems  Constitutional:  Negative for fever.  Respiratory:  Negative for shortness of breath.   Cardiovascular:  Negative for chest pain.  Gastrointestinal:  Negative for abdominal pain.  Neurological:  Positive for numbness. Negative for dizziness, facial asymmetry, speech difficulty  and headaches.  All other systems reviewed and are negative.   Updated Vital Signs BP 135/64   Pulse 85   Temp 98 F (36.7 C) (Temporal)   Resp 18   Wt 87.4 kg   SpO2 100%   BMI 35.24 kg/m   Physical Exam Vitals and nursing note reviewed.  Constitutional:      General: She is not in acute distress.    Appearance: Normal appearance. She is well-developed.  HENT:     Head: Normocephalic and atraumatic.     Nose: Nose normal.     Mouth/Throat:     Mouth: Mucous membranes are  moist.     Pharynx: Oropharynx is clear.  Eyes:     Extraocular Movements: Extraocular movements intact.     Pupils: Pupils are equal, round, and reactive to light.  Cardiovascular:     Rate and Rhythm: Normal rate and regular rhythm.     Pulses: Normal pulses.     Heart sounds: Normal heart sounds.  Pulmonary:     Effort: Pulmonary effort is normal. No respiratory distress.     Breath sounds: Normal breath sounds. No wheezing or rales.  Abdominal:     General: Bowel sounds are normal. There is no distension.     Palpations: Abdomen is soft.     Tenderness: There is no abdominal tenderness. There is no guarding or rebound.  Musculoskeletal:        General: Normal range of motion.     Cervical back: Neck supple.  Skin:    General: Skin is warm and dry.     Capillary Refill: Capillary refill takes less than 2 seconds.     Coloration: Skin is not jaundiced.     Findings: No erythema or rash.  Neurological:     General: No focal deficit present.     Mental Status: She is alert and oriented to person, place, and time.     Cranial Nerves: No cranial nerve deficit.     Motor: No weakness.     Gait: Gait normal.     Deep Tendon Reflexes: Reflexes normal.  Psychiatric:        Mood and Affect: Mood normal.     (all labs ordered are listed, but only abnormal results are displayed) Results for orders placed or performed during the hospital encounter of 04/02/24  Ethanol   Collection Time: 04/02/24  2:07 AM  Result Value Ref Range   Alcohol, Ethyl (B) <15 <15 mg/dL  Protime-INR   Collection Time: 04/02/24  2:07 AM  Result Value Ref Range   Prothrombin Time 13.4 11.4 - 15.2 seconds   INR 1.0 0.8 - 1.2  APTT   Collection Time: 04/02/24  2:07 AM  Result Value Ref Range   aPTT 33 24 - 36 seconds  CBC   Collection Time: 04/02/24  2:07 AM  Result Value Ref Range   WBC 9.2 4.0 - 10.5 K/uL   RBC 4.83 3.87 - 5.11 MIL/uL   Hemoglobin 12.5 12.0 - 15.0 g/dL   HCT 59.8 63.9 - 53.9 %    MCV 83.0 80.0 - 100.0 fL   MCH 25.9 (L) 26.0 - 34.0 pg   MCHC 31.2 30.0 - 36.0 g/dL   RDW 84.4 88.4 - 84.4 %   Platelets 266 150 - 400 K/uL   nRBC 0.0 0.0 - 0.2 %  Differential   Collection Time: 04/02/24  2:07 AM  Result Value Ref Range   Neutrophils Relative % 55 %  Neutro Abs 5.1 1.7 - 7.7 K/uL   Lymphocytes Relative 35 %   Lymphs Abs 3.2 0.7 - 4.0 K/uL   Monocytes Relative 7 %   Monocytes Absolute 0.6 0.1 - 1.0 K/uL   Eosinophils Relative 3 %   Eosinophils Absolute 0.3 0.0 - 0.5 K/uL   Basophils Relative 0 %   Basophils Absolute 0.0 0.0 - 0.1 K/uL   Immature Granulocytes 0 %   Abs Immature Granulocytes 0.02 0.00 - 0.07 K/uL  Comprehensive metabolic panel   Collection Time: 04/02/24  2:07 AM  Result Value Ref Range   Sodium 141 135 - 145 mmol/L   Potassium 3.7 3.5 - 5.1 mmol/L   Chloride 104 98 - 111 mmol/L   CO2 24 22 - 32 mmol/L   Glucose, Bld 100 (H) 70 - 99 mg/dL   BUN 14 8 - 23 mg/dL   Creatinine, Ser 9.24 0.44 - 1.00 mg/dL   Calcium 9.5 8.9 - 89.6 mg/dL   Total Protein 7.3 6.5 - 8.1 g/dL   Albumin 3.5 3.5 - 5.0 g/dL   AST 17 15 - 41 U/L   ALT 12 0 - 44 U/L   Alkaline Phosphatase 72 38 - 126 U/L   Total Bilirubin 0.5 0.0 - 1.2 mg/dL   GFR, Estimated >39 >39 mL/min   Anion gap 13 5 - 15  I-stat chem 8, ED   Collection Time: 04/02/24  2:08 AM  Result Value Ref Range   Sodium 142 135 - 145 mmol/L   Potassium 3.8 3.5 - 5.1 mmol/L   Chloride 107 98 - 111 mmol/L   BUN 13 8 - 23 mg/dL   Creatinine, Ser 9.19 0.44 - 1.00 mg/dL   Glucose, Bld 99 70 - 99 mg/dL   Calcium, Ion 8.78 8.84 - 1.40 mmol/L   TCO2 25 22 - 32 mmol/L   Hemoglobin 13.6 12.0 - 15.0 g/dL   HCT 59.9 63.9 - 53.9 %   MR BRAIN WO CONTRAST Result Date: 04/02/2024 EXAM: MRI BRAIN WITHOUT CONTRAST 04/02/2024 05:35:08 AM TECHNIQUE: Multiplanar multisequence MRI of the head/brain was performed without the administration of intravenous contrast. COMPARISON: MRI of the head dated 09/14/2018. CLINICAL  HISTORY: Neuro deficit, acute, stroke suspected. FINDINGS: BRAIN AND VENTRICLES: No acute infarct. No intracranial hemorrhage. No mass. No midline shift. No hydrocephalus. There is mild cerebral white matter disease present. The sella is unremarkable. Normal flow voids. ORBITS: No acute abnormality. SINUSES AND MASTOIDS: No acute abnormality. BONES AND SOFT TISSUES: Normal marrow signal. No acute soft tissue abnormality. IMPRESSION: 1. No acute intracranial abnormality. 2. Mild cerebral white matter disease. Electronically signed by: Evalene Coho MD 04/02/2024 05:38 AM EDT RP Workstation: [id number]   CT HEAD CODE STROKE WO CONTRAST Result Date: 04/02/2024 EXAM: CT HEAD WITHOUT CONTRAST 04/02/2024 02:08:48 AM TECHNIQUE: CT of the head was performed without the administration of intravenous contrast. Automated exposure control, iterative reconstruction, and/or weight based adjustment of the mA/kV was utilized to reduce the radiation dose to as low as reasonably achievable. COMPARISON: None available. CLINICAL HISTORY: Neuro deficit, acute, stroke suspected. Stroke Alert Dr. Sallyann [phone]; Left sided weakness, Aphasia. FINDINGS: BRAIN AND VENTRICLES: No acute hemorrhage. No evidence of acute infarct. ASPECTS is 10. No hydrocephalus. No extra-axial collection. No mass effect or midline shift. ORBITS: No acute abnormality. SINUSES: No acute abnormality. SOFT TISSUES AND SKULL: No acute soft tissue abnormality. No skull fracture. IMPRESSION: 1. No acute intracranial abnormality. 2. ASPECTS is 10. Findings communicated to Dr. MAGDALENO Sallyann at 2:16  AM on 04/02/2024. Electronically signed by: Franky Stanford MD 04/02/2024 02:17 AM EDT RP Workstation: [id number]   DG Foot Complete Right Result Date: 03/09/2024 Please see detailed radiograph report in office note.   Radiology: MR BRAIN WO CONTRAST Result Date: 04/02/2024 EXAM: MRI BRAIN WITHOUT CONTRAST 04/02/2024 05:35:08 AM TECHNIQUE: Multiplanar multisequence MRI of  the head/brain was performed without the administration of intravenous contrast. COMPARISON: MRI of the head dated 09/14/2018. CLINICAL HISTORY: Neuro deficit, acute, stroke suspected. FINDINGS: BRAIN AND VENTRICLES: No acute infarct. No intracranial hemorrhage. No mass. No midline shift. No hydrocephalus. There is mild cerebral white matter disease present. The sella is unremarkable. Normal flow voids. ORBITS: No acute abnormality. SINUSES AND MASTOIDS: No acute abnormality. BONES AND SOFT TISSUES: Normal marrow signal. No acute soft tissue abnormality. IMPRESSION: 1. No acute intracranial abnormality. 2. Mild cerebral white matter disease. Electronically signed by: Evalene Coho MD 04/02/2024 05:38 AM EDT RP Workstation: [id number]   CT HEAD CODE STROKE WO CONTRAST Result Date: 04/02/2024 EXAM: CT HEAD WITHOUT CONTRAST 04/02/2024 02:08:48 AM TECHNIQUE: CT of the head was performed without the administration of intravenous contrast. Automated exposure control, iterative reconstruction, and/or weight based adjustment of the mA/kV was utilized to reduce the radiation dose to as low as reasonably achievable. COMPARISON: None available. CLINICAL HISTORY: Neuro deficit, acute, stroke suspected. Stroke Alert Dr. Sallyann [phone]; Left sided weakness, Aphasia. FINDINGS: BRAIN AND VENTRICLES: No acute hemorrhage. No evidence of acute infarct. ASPECTS is 10. No hydrocephalus. No extra-axial collection. No mass effect or midline shift. ORBITS: No acute abnormality. SINUSES: No acute abnormality. SOFT TISSUES AND SKULL: No acute soft tissue abnormality. No skull fracture. IMPRESSION: 1. No acute intracranial abnormality. 2. ASPECTS is 10. Findings communicated to Dr. MAGDALENO Sallyann at 2:16 AM on 04/02/2024. Electronically signed by: Franky Stanford MD 04/02/2024 02:17 AM EDT RP Workstation: [id number]     Procedures   Medications Ordered in the ED - No data to display                                  Medical Decision  Making Amount and/or Complexity of Data Reviewed Labs: ordered.    Details: Negative etoh <15.  Normal white 9.2, 12.5, normal platelets.   Radiology: ordered.    Details: Negative MRi negative CT head by me  Discussion of management or test interpretation with external provider(s): 6:13 AM case d/w Dr. Sallyann, no stroke stable for discharge   Risk Risk Details: Code stroke was canceled.  Patient is well appearing without appreciable deficits at this time.  Will have patient follow up with neurology and PMD as an outpatient.  Stable for discharge with close follow up.  Strict returns      Final diagnoses:  Paresthesias   No signs of systemic illness or infection. The patient is nontoxic-appearing on exam and vital signs are within normal limits.  I have reviewed the triage vital signs and the nursing notes. Pertinent labs & imaging results that were available during my care of the patient were reviewed by me and considered in my medical decision making (see chart for details). After history, exam, and medical workup I feel the patient has been appropriately medically screened and is safe for discharge home. Pertinent diagnoses were discussed with the patient. Patient was given return precautions.    ED Discharge Orders     None          Lazar Tierce, MD  04/02/24 0622  

## 2024-04-02 NOTE — Patient Instructions (Addendum)
 Stop zanaflex  Decrease lyrica  dose to 75mg  at bedtime only Start aspirin  81mg  daily with food

## 2024-04-02 NOTE — Assessment & Plan Note (Signed)
 She woke up last night with left side of face of left side of body numbness. This was associated with aphasia and left side weakness. This led to an ED visit last night. She took 2tabs ASA 81mg  prior to EMS arrival to her home last night. I reviewed ED lab results results and imaging reports-CT and MRI brain indicated no acute finding. Lyrica  75mg  BID and zanaflex  4mg  BID was prescribed within the last 46month to manage chronic left hip pain. Today she reports persistent numbness of left side of face and dizziness. Resolved left side of body weakness and numbness.  Normal neuro exam, but appears fatigued and drowsy. She os alert and oriented. She follows all commends without hesitation. She is the exam room alone, but Her husband drove her to office today. I suspect symptoms may be related to med side effects Advised to stop zanaflex , decrease lyrica  dose to 75mg  in PM only, and start ASA 81mg  EC daily. Entered urgent neurology referral. F/up in 1week if not seen by neurology Provided ED precautions

## 2024-04-02 NOTE — Code Documentation (Signed)
 Stroke Response Nurse Documentation Code Documentation  Torunn Chancellor is a 63 y.o. female arriving to [facility]  via [facility] on 10/2 with past medical hx of HTN, OSA. On No antithrombotic. Code stroke was activated by EMS.   Patient from home where she was LKW at 2200 and now complaining of left sided numbness and word difficulty.  Stroke team at the bedside on patient arrival. Labs drawn and patient cleared for CT by Dr. Nettie. Patient to CT with team. NIHSS 5, see documentation for details and code stroke times. Patient with bilateral leg weakness and left decreased sensation on exam. The following imaging was completed:  CT Head. Patient is not a candidate for IV Thrombolytic due to too mild to treat. Patient is not a candidate for IR due to low suspicion for stroke.   Care Plan: Neuro checks q2 hrs.    Bedside handoff with ED RN Con.    Griselda Alm ORN  Rapid Response RN

## 2024-04-09 ENCOUNTER — Encounter: Payer: Self-pay | Admitting: Neurology

## 2024-04-09 DIAGNOSIS — M791 Myalgia, unspecified site: Secondary | ICD-10-CM | POA: Diagnosis not present

## 2024-04-09 DIAGNOSIS — M7918 Myalgia, other site: Secondary | ICD-10-CM | POA: Diagnosis not present

## 2024-04-09 DIAGNOSIS — M2550 Pain in unspecified joint: Secondary | ICD-10-CM | POA: Diagnosis not present

## 2024-04-10 ENCOUNTER — Other Ambulatory Visit: Payer: Self-pay

## 2024-04-10 ENCOUNTER — Encounter: Payer: Self-pay | Admitting: Nurse Practitioner

## 2024-04-10 ENCOUNTER — Ambulatory Visit (INDEPENDENT_AMBULATORY_CARE_PROVIDER_SITE_OTHER): Admitting: Nurse Practitioner

## 2024-04-10 ENCOUNTER — Other Ambulatory Visit (HOSPITAL_COMMUNITY): Payer: Self-pay

## 2024-04-10 VITALS — BP 134/76 | HR 81 | Ht 62.0 in | Wt 189.6 lb

## 2024-04-10 DIAGNOSIS — T466X5A Adverse effect of antihyperlipidemic and antiarteriosclerotic drugs, initial encounter: Secondary | ICD-10-CM

## 2024-04-10 DIAGNOSIS — E785 Hyperlipidemia, unspecified: Secondary | ICD-10-CM | POA: Diagnosis not present

## 2024-04-10 DIAGNOSIS — G720 Drug-induced myopathy: Secondary | ICD-10-CM

## 2024-04-10 DIAGNOSIS — E1169 Type 2 diabetes mellitus with other specified complication: Secondary | ICD-10-CM | POA: Diagnosis not present

## 2024-04-10 DIAGNOSIS — R202 Paresthesia of skin: Secondary | ICD-10-CM | POA: Diagnosis not present

## 2024-04-10 DIAGNOSIS — M791 Myalgia, unspecified site: Secondary | ICD-10-CM | POA: Diagnosis not present

## 2024-04-10 MED ORDER — EZETIMIBE 10 MG PO TABS
10.0000 mg | ORAL_TABLET | Freq: Every day | ORAL | 5 refills | Status: DC
  Filled 2024-04-10: qty 30, 30d supply, fill #0

## 2024-04-10 NOTE — Assessment & Plan Note (Signed)
 LDL at 89 Unable to tolerate statin She agreed to zetia Prescription F/up in 2months for repeat lipid panel and hepatic panel Encouraged to maintain a mediterranean diet

## 2024-04-10 NOTE — Patient Instructions (Signed)
 Start zetia Maintain other med doses and upcoming appointment with neurology

## 2024-04-10 NOTE — Assessment & Plan Note (Signed)
 Fibromyalgia diagnosis per rheumatology. Lyrica  dose decreased to 75mg  daily due to symptoms pf paresthesia and fatigue.  Consider switching lyrica  to savella or cymbalta if symptoms do not resolve F/up in 2months

## 2024-04-10 NOTE — Progress Notes (Signed)
 Established Patient Visit  Patient: Shelly Cohen   DOB: 08-May-1961   63 y.o. Female  MRN: [id number] Visit Date: 04/10/2024  Subjective:    Chief Complaint  Patient presents with   Follow-up    1 week follow up for paresthesias    HPI Paresthesia Improved symptoms with discontinuation of muscle relaxant and decreased lyrica  dose Describes symptoms as mild today. She has upcoming appointment with neurology on 04/22/2024 She had appointment with rheumatology yesterday, diagnosed with fibromyalgia, aquatic therapy recommended.  Advised to maintain current med doses and appointment with neurology. Consider switching lyrica  to cymbalta or savella? F/up in 2months  Myalgia Fibromyalgia diagnosis per rheumatology. Lyrica  dose decreased to 75mg  daily due to symptoms pf paresthesia and fatigue.  Consider switching lyrica  to savella or cymbalta if symptoms do not resolve F/up in 2months  Hyperlipidemia associated with type 2 diabetes mellitus (HCC) LDL at 89 Unable to tolerate statin She agreed to zetia Prescription F/up in 2months for repeat lipid panel and hepatic panel Encouraged to maintain a mediterranean diet  Reviewed medical, surgical, and social history today  Medications: Outpatient Medications Prior to Visit  Medication Sig   albuterol  (VENTOLIN  HFA) 108 (90 Base) MCG/ACT inhaler Inhale 1-2 puffs into the lungs every 6 (six) hours as needed for wheezing or shortness of breath.   amLODipine  (NORVASC ) 10 MG tablet Take 1 tablet (10 mg total) by mouth every evening.   aspirin  EC 81 MG tablet Take 1 tablet (81 mg total) by mouth daily. Swallow whole.   BIOTIN PO Take 1 tablet by mouth daily.   CALCIUM-VITAMIN D  PO Take 1 tablet by mouth daily.   carvedilol  (COREG ) 3.125 MG tablet Take 1 tablet (3.125 mg total) by mouth 2 (two) times daily.   chlorhexidine  (PERIDEX ) 0.12 % solution Swish 15 mL in mouth for 30 seconds then spit out, use twice daily.    diclofenac  Sodium (VOLTAREN ) 1 % GEL Apply 2 g topically 4 (four) times daily. Rub into affected area of foot 2 to 4 times daily   docusate sodium  (COLACE) 100 MG capsule Take 100 mg by mouth every other day.   fluticasone (FLONASE) 50 MCG/ACT nasal spray Place 1 spray into both nostrils daily as needed for allergies or rhinitis.   latanoprost  (XALATAN ) 0.005 % ophthalmic solution Place 1 drop into both eyes every evening.   levothyroxine  (SYNTHROID ) 50 MCG tablet Take 1 tablet (50 mcg total) by mouth daily before breakfast.   lisinopril  (ZESTRIL ) 5 MG tablet Take 1 tablet (5 mg total) by mouth daily.   MELATONIN PO Take 10 mg by mouth at bedtime.   pregabalin  (LYRICA ) 75 MG capsule Take 1 capsule (75 mg total) by mouth 2 (two) times daily.   Semaglutide ,0.25 or 0.5MG /DOS, (OZEMPIC , 0.25 OR 0.5 MG/DOSE,) 2 MG/3ML SOPN Inject 0.5 mg into the skin every 7 (seven) days.   [DISCONTINUED] tiZANidine  (ZANAFLEX ) 4 MG tablet Take 1 tablet (4 mg total) by mouth 2 (two) times daily as needed for muscle spasms. (Patient not taking: Reported on 04/10/2024)   No facility-administered medications prior to visit.   Reviewed past medical and social history.   ROS per HPI above      Objective:  BP 134/76 (BP Location: Left Arm, Patient Position: Sitting, Cuff Size: Large)   Pulse 81   Ht 5' 2 (1.575 m)   Wt 189 lb 9.6 oz (86 kg)   SpO2 99%  BMI 34.68 kg/m      Physical Exam Vitals and nursing note reviewed.  Constitutional:      General: She is not in acute distress. Cardiovascular:     Rate and Rhythm: Normal rate.     Pulses: Normal pulses.  Pulmonary:     Effort: Pulmonary effort is normal.  Neurological:     Mental Status: She is alert and oriented to person, place, and time.  Psychiatric:        Mood and Affect: Mood normal.        Behavior: Behavior normal.        Thought Content: Thought content normal.     No results found for any visits on 04/10/24.    Assessment & Plan:     Problem List Items Addressed This Visit     Hyperlipidemia associated with type 2 diabetes mellitus (HCC)   LDL at 89 Unable to tolerate statin She agreed to zetia Prescription F/up in 2months for repeat lipid panel and hepatic panel Encouraged to maintain a mediterranean diet      Relevant Medications   ezetimibe (ZETIA) 10 MG tablet   Myalgia   Fibromyalgia diagnosis per rheumatology. Lyrica  dose decreased to 75mg  daily due to symptoms pf paresthesia and fatigue.  Consider switching lyrica  to savella or cymbalta if symptoms do not resolve F/up in 2months      Paresthesia - Primary   Improved symptoms with discontinuation of muscle relaxant and decreased lyrica  dose Describes symptoms as mild today. She has upcoming appointment with neurology on 04/22/2024 She had appointment with rheumatology yesterday, diagnosed with fibromyalgia, aquatic therapy recommended.  Advised to maintain current med doses and appointment with neurology. Consider switching lyrica  to cymbalta or savella? F/up in 2months      Statin myopathy   Relevant Medications   ezetimibe (ZETIA) 10 MG tablet   Return for maintain upcoming appt for CPE (fasting).     Roselie Mood, NP

## 2024-04-10 NOTE — Assessment & Plan Note (Signed)
 Improved symptoms with discontinuation of muscle relaxant and decreased lyrica  dose Describes symptoms as mild today. She has upcoming appointment with neurology on 04/22/2024 She had appointment with rheumatology yesterday, diagnosed with fibromyalgia, aquatic therapy recommended.  Advised to maintain current med doses and appointment with neurology. Consider switching lyrica  to cymbalta or savella? F/up in 2months

## 2024-04-14 ENCOUNTER — Encounter: Payer: Self-pay | Admitting: Nurse Practitioner

## 2024-04-15 DIAGNOSIS — R202 Paresthesia of skin: Secondary | ICD-10-CM | POA: Diagnosis not present

## 2024-04-15 DIAGNOSIS — M1610 Unilateral primary osteoarthritis, unspecified hip: Secondary | ICD-10-CM | POA: Diagnosis not present

## 2024-04-15 DIAGNOSIS — E038 Other specified hypothyroidism: Secondary | ICD-10-CM | POA: Diagnosis not present

## 2024-04-15 DIAGNOSIS — G4733 Obstructive sleep apnea (adult) (pediatric): Secondary | ICD-10-CM | POA: Diagnosis not present

## 2024-04-15 DIAGNOSIS — Z9884 Bariatric surgery status: Secondary | ICD-10-CM | POA: Diagnosis not present

## 2024-04-15 DIAGNOSIS — R200 Anesthesia of skin: Secondary | ICD-10-CM | POA: Diagnosis not present

## 2024-04-15 DIAGNOSIS — I10 Essential (primary) hypertension: Secondary | ICD-10-CM | POA: Diagnosis not present

## 2024-04-21 NOTE — Progress Notes (Unsigned)
 NEUROLOGY CONSULTATION NOTE  Shelly Cohen MRN: [id number] DOB: 1961/01/05  Referring provider: Roselie Bishop Mood, NP Primary care provider: Roselie Bishop Mood, NP  Reason for consult:  paresthesias  Assessment/Plan:   Right hemispheric transient ischemic attack - I have no other explanation for unilateral numbness and speech disturbance.  She had pain as well as numbness in the right leg as well, which may be a component of her fibromyalgia. Hypertension Hyperlipidemia Prediabetes Obstructive sleep apnea.    Continue TIA workup: CTA head and neck 2D echo 2 week cardiac event monitor Secondary stroke prevention as managed by PCP: ASA 81mg  daily Normotensive blood pressure.  Follow up with PCP. LDL goal less than 70.  On Zetia.  If patient has statin intolerance, PCP may want to consider Repatha. Mediterranean diet, management of OSA and routine exercise Follow up in 7 months.   Subjective:  Shelly Cohen is a 63 year old right-handed female with HTN, prediabetes, mild OSA, alpha thalassemia trait, arthritis, thyroid  disease and fibromyalgia who presents for paresthesias.  History supplemented by ED and referring provider's notes.  On 04/02/2024, she woke up in the middle of the night and felt numbness of her entire left side of her body (face, arm, leg) as well as the right leg.  No facial droop or weakness.  She fell back asleep and still had symptoms when she woke up later that morning.  When EMS arrived, she was noted to have hesitant speech/word-finding difficulty.  She was seen in the ED at [facility] where CT head showed no acute findings and follow up MRI of brain showed mild chronic small vessel ischemic changes but no acute intracranial abnormality.  EKG, labs (CBC, CMP, ETOH, protime-INR) were unremarkable.  In the ED, left sided numbness and word-finding difficulty was improving and right leg numbness already resolved.  Evaluated by neurology who  did not suspect cerebrovascular event.  Exam notable for positive Hoover sign involving left leg raise, bilateral lower extremity weakness as well as weakness in upper extremities improved with encouragement, suggesting limitation due to chronic hip pain (has mild arthritis in the hips with history of right hip replacement with functional overlay.  She has chronic pain including myalgias for which she has been on Lyrica  and muscle relaxants.  She was recently diagnosed by rheumatology with fibromyalgia.     Labs: 02/25/2024 Lipid panel with t chol 202, HDL 87.30, LDL 102; Hgb A1c 5.9; TSH 8.13, free T4 0.86 10/02/2023 CK 92, sed rate 59, CRP 2.1 08/27/2023 B12 >1537  Current medications:  ASA 81mg  daily, Lyrica  75mg  QHS, Ozempic , lisinopril , Coreg , amlodipine , Synthroid , Zetia.  Unable to tolerate statins due to pain/myopathy.  PAST MEDICAL HISTORY: Past Medical History:  Diagnosis Date   Allergy    Alpha thalassemia trait    Arthritis    hips   Fibroadenoma    Glaucoma    monitored every 6months, right eye worse than left    History of positive PPD, treatment status unknown    Hypertension    OSA (obstructive sleep apnea)    mild with AHI 8/hr   Pre-diabetes    Thyroid  disease     PAST SURGICAL HISTORY: Past Surgical History:  Procedure Laterality Date   ABDOMINAL HYSTERECTOMY  2013   BREAST BIOPSY  2013   BUNIONECTOMY  10/2014   CHOLECYSTECTOMY  2015   COLONOSCOPY     hallux limi Right 2019   great  toe revision   JOINT REPLACEMENT  08/31/2017  right hip   TOTAL HIP ARTHROPLASTY Right 09/12/2018   Procedure: RIGHT TOTAL HIP ARTHROPLASTY ANTERIOR APPROACH;  Surgeon: Vernetta Lonni GRADE, MD;  Location: [facility];  Service: Orthopedics;  Laterality: Right;   TUBAL LIGATION  1990   UPPER GI ENDOSCOPY N/A 02/20/2021   Procedure: UPPER GI ENDOSCOPY;  Surgeon: Tanda Locus, MD;  Location: [facility];  Service: General;  Laterality: N/A;    MEDICATIONS: Current Outpatient  Medications on File Prior to Visit  Medication Sig Dispense Refill   albuterol  (VENTOLIN  HFA) 108 (90 Base) MCG/ACT inhaler Inhale 1-2 puffs into the lungs every 6 (six) hours as needed for wheezing or shortness of breath. 6.7 g 0   amLODipine  (NORVASC ) 10 MG tablet Take 1 tablet (10 mg total) by mouth every evening. 90 tablet 3   aspirin  EC 81 MG tablet Take 1 tablet (81 mg total) by mouth daily. Swallow whole.     BIOTIN PO Take 1 tablet by mouth daily.     CALCIUM-VITAMIN D  PO Take 1 tablet by mouth daily.     carvedilol  (COREG ) 3.125 MG tablet Take 1 tablet (3.125 mg total) by mouth 2 (two) times daily. 180 tablet 3   chlorhexidine  (PERIDEX ) 0.12 % solution Swish 15 mL in mouth for 30 seconds then spit out, use twice daily. 473 mL 2   diclofenac  Sodium (VOLTAREN ) 1 % GEL Apply 2 g topically 4 (four) times daily. Rub into affected area of foot 2 to 4 times daily 100 g 2   docusate sodium  (COLACE) 100 MG capsule Take 100 mg by mouth every other day.     ezetimibe (ZETIA) 10 MG tablet Take 1 tablet (10 mg total) by mouth daily. 30 tablet 5   fluticasone (FLONASE) 50 MCG/ACT nasal spray Place 1 spray into both nostrils daily as needed for allergies or rhinitis.     latanoprost  (XALATAN ) 0.005 % ophthalmic solution Place 1 drop into both eyes every evening. 5 mL 6   levothyroxine  (SYNTHROID ) 50 MCG tablet Take 1 tablet (50 mcg total) by mouth daily before breakfast. 90 tablet 1   lisinopril  (ZESTRIL ) 5 MG tablet Take 1 tablet (5 mg total) by mouth daily. 90 tablet 1   MELATONIN PO Take 10 mg by mouth at bedtime.     pregabalin  (LYRICA ) 75 MG capsule Take 1 capsule (75 mg total) by mouth 2 (two) times daily. 60 capsule 5   Semaglutide ,0.25 or 0.5MG /DOS, (OZEMPIC , 0.25 OR 0.5 MG/DOSE,) 2 MG/3ML SOPN Inject 0.5 mg into the skin every 7 (seven) days. 9 mL 0   No current facility-administered medications on file prior to visit.    ALLERGIES: Allergies  Allergen Reactions   Dilaudid  [Hydromorphone   Hcl] Itching   Diphenhydramine  Hcl Itching    hallucination    Hydrocodone  Nausea Only   Oxycodone  Nausea And Vomiting   Azithromycin Itching and Rash   Cephalexin Rash   Penicillin G Rash    Did it involve swelling of the face/tongue/throat, SOB, or low BP? No Did it involve sudden or severe rash/hives, skin peeling, or any reaction on the inside of your mouth or nose? No Did you need to seek medical attention at a hospital or doctor's office? No When did it last happen?      childhood If all above answers are "NO", may proceed with cephalosporin use.    Zolpidem  Itching and Rash    FAMILY HISTORY: Family History  Problem Relation Age of Onset   Hypertension Mother    Anemia  Mother    Arthritis Mother        rheumatoid arthritis   Kidney disease Mother    Cataracts Mother    Heart disease Mother    Colon polyps Sister    Hypertension Sister    Cataracts Sister    Colon polyps Brother    Hypertension Brother    Glaucoma Brother    Diabetes Brother    Glaucoma Maternal Aunt    Cancer Maternal Grandmother        leukemia   Cancer Maternal Grandfather        lung   Asthma Maternal Aunt    COPD Maternal Aunt    Diabetes Maternal Aunt    Diabetes Brother    Heart disease Brother    Hypertension Brother    Vision loss Brother    Asthma Maternal Aunt    COPD Maternal Aunt    Diabetes Maternal Aunt    Colon cancer Neg Hx    Esophageal cancer Neg Hx    Rectal cancer Neg Hx    Stomach cancer Neg Hx     Objective:  Blood pressure (!) 159/86, pulse 90, height 5' 2 (1.575 m), weight 186 lb (84.4 kg), SpO2 97%. General: No acute distress.  Patient appears well-groomed.   Head:  Normocephalic/atraumatic Eyes:  fundi examined but not visualized Neck: supple, no paraspinal tenderness, full range of motion Heart: regular rate and rhythm Neurological Exam: Mental status: alert and oriented to person, place, and time, speech fluent and not dysarthric, language  intact. Cranial nerves: CN I: not tested CN II: pupils equal, round and reactive to light, visual fields intact CN III, IV, VI:  full range of motion, no nystagmus, no ptosis CN V: facial sensation intact. CN VII: upper and lower face symmetric CN VIII: hearing intact CN IX, X: gag intact, uvula midline CN XI: sternocleidomastoid and trapezius muscles intact CN XII: tongue midline Bulk & Tone: normal, no fasciculations. Motor:  muscle strength 5/5 throughout Sensation:  Pinprick and vibratory sensation intact. Deep Tendon Reflexes:  2+ throughout,  toes downgoing.   Finger to nose testing:  Without dysmetria.   Gait:  Normal station and stride.  Romberg negative.    Thank you for allowing me to take part in the care of this patient.  Juliene Dunnings, DO  CC: Roselie Bishop Mood, NP

## 2024-04-22 ENCOUNTER — Ambulatory Visit: Attending: Neurology

## 2024-04-22 ENCOUNTER — Encounter: Payer: Self-pay | Admitting: Neurology

## 2024-04-22 ENCOUNTER — Ambulatory Visit (INDEPENDENT_AMBULATORY_CARE_PROVIDER_SITE_OTHER): Admitting: Neurology

## 2024-04-22 VITALS — BP 159/86 | HR 90 | Ht 62.0 in | Wt 186.0 lb

## 2024-04-22 DIAGNOSIS — G459 Transient cerebral ischemic attack, unspecified: Secondary | ICD-10-CM

## 2024-04-22 DIAGNOSIS — E1169 Type 2 diabetes mellitus with other specified complication: Secondary | ICD-10-CM

## 2024-04-22 DIAGNOSIS — R7303 Prediabetes: Secondary | ICD-10-CM

## 2024-04-22 DIAGNOSIS — I10 Essential (primary) hypertension: Secondary | ICD-10-CM

## 2024-04-22 DIAGNOSIS — E785 Hyperlipidemia, unspecified: Secondary | ICD-10-CM | POA: Diagnosis not present

## 2024-04-22 NOTE — Progress Notes (Unsigned)
Enrolled for [employer] to mail a ZIO AT Live Telemetry monitor to patients address on file.   Dr. Turner to read. 

## 2024-04-22 NOTE — Patient Instructions (Addendum)
 Continue aspirin  81mg  daily Continue blood pressure medications Consider alternative cholesterol lowering medications such as Repatha Check CTA head and neck. We have sent a referral to [facility] for your MRI and they will call you directly to schedule your appointment. They are located at [address] [facility]. If you need to contact them directly please call [phone].  Check 2D echocardiogram Check 2 week cardiac event monitor Mediterranean diet Follow up 7 months.

## 2024-04-23 NOTE — Progress Notes (Signed)
 Prior Authorization not required for Long Term Monitor-Live Telemetry (14 day monitor) [id number] [phone]. Call reference number#:[id number] [id number].   Prior Authorization not required for Echocardiogram Complete. REU#:[id number]. Call reference#:[id number].

## 2024-04-26 ENCOUNTER — Other Ambulatory Visit: Payer: Self-pay | Admitting: Nurse Practitioner

## 2024-04-26 DIAGNOSIS — G720 Drug-induced myopathy: Secondary | ICD-10-CM

## 2024-04-26 DIAGNOSIS — E1169 Type 2 diabetes mellitus with other specified complication: Secondary | ICD-10-CM

## 2024-04-26 DIAGNOSIS — Z8673 Personal history of transient ischemic attack (TIA), and cerebral infarction without residual deficits: Secondary | ICD-10-CM

## 2024-04-26 DIAGNOSIS — I10 Essential (primary) hypertension: Secondary | ICD-10-CM

## 2024-04-26 NOTE — Progress Notes (Unsigned)
 Advise Shelly Cohen that I entered a referral to the lipid clinic to discuss ues of repatha since she is unable to tolerate statins and LDL is >70 with current use of zetia.

## 2024-04-28 ENCOUNTER — Ambulatory Visit
Admission: RE | Admit: 2024-04-28 | Discharge: 2024-04-28 | Disposition: A | Discharge status: Home / Self Care | Source: Ambulatory Visit | Attending: Neurology | Admitting: Neurology

## 2024-04-28 DIAGNOSIS — G459 Transient cerebral ischemic attack, unspecified: Secondary | ICD-10-CM

## 2024-04-28 MED ORDER — IOPAMIDOL (ISOVUE-370) INJECTION 76%
80.0000 mL | Freq: Once | INTRAVENOUS | Status: AC | PRN
  Administered 2024-04-28: 80 mL via INTRAVENOUS

## 2024-04-28 NOTE — Progress Notes (Signed)
 Patient called and informed of referral to Lipid Clinic and to answer any unknown numbers for it may be the office calling to schedule an appointment. She verbalized understanding and all (if any) questions were answered.

## 2024-04-30 ENCOUNTER — Ambulatory Visit (HOSPITAL_COMMUNITY)
Admission: RE | Admit: 2024-04-30 | Discharge: 2024-04-30 | Disposition: A | Discharge status: Home / Self Care | Source: Ambulatory Visit | Attending: Internal Medicine | Admitting: Internal Medicine

## 2024-04-30 ENCOUNTER — Other Ambulatory Visit: Payer: Self-pay

## 2024-04-30 ENCOUNTER — Ambulatory Visit: Payer: Self-pay | Admitting: Neurology

## 2024-04-30 DIAGNOSIS — G459 Transient cerebral ischemic attack, unspecified: Secondary | ICD-10-CM

## 2024-04-30 LAB — ECHOCARDIOGRAM COMPLETE
Area-P 1/2: 5.88 cm2
S' Lateral: 3 cm

## 2024-04-30 NOTE — Progress Notes (Signed)
 Patient advised.

## 2024-05-04 ENCOUNTER — Encounter: Payer: Self-pay | Admitting: Radiology

## 2024-05-04 DIAGNOSIS — H401121 Primary open-angle glaucoma, left eye, mild stage: Secondary | ICD-10-CM | POA: Diagnosis not present

## 2024-05-04 DIAGNOSIS — H40011 Open angle with borderline findings, low risk, right eye: Secondary | ICD-10-CM | POA: Diagnosis not present

## 2024-05-04 NOTE — Telephone Encounter (Signed)
 Patient advised of results and orders place for U/S carotid.

## 2024-05-04 NOTE — Telephone Encounter (Signed)
-----   Message from Juliene Lamar Dunnings sent at 05/03/2024  4:49 PM EST ----- Arteries in the head do not show anything concerning.  I also had wanted to check CTA of the neck as well.  Instead, we can order a bilateral carotid ultrasound instead (for TIA).   ----- Message ----- From: Interface, Rad Results In Sent: 05/03/2024  11:25 AM EST To: Juliene JONELLE Dunnings, DO

## 2024-05-13 ENCOUNTER — Other Ambulatory Visit (HOSPITAL_BASED_OUTPATIENT_CLINIC_OR_DEPARTMENT_OTHER): Payer: Self-pay

## 2024-05-13 ENCOUNTER — Other Ambulatory Visit: Payer: Self-pay | Admitting: Nurse Practitioner

## 2024-05-13 ENCOUNTER — Other Ambulatory Visit (HOSPITAL_COMMUNITY): Payer: Self-pay

## 2024-05-13 ENCOUNTER — Encounter: Payer: Self-pay | Admitting: Emergency Medicine

## 2024-05-13 ENCOUNTER — Ambulatory Visit: Admission: EM | Admit: 2024-05-13 | Discharge: 2024-05-13 | Disposition: A | Discharge status: Home / Self Care

## 2024-05-13 ENCOUNTER — Other Ambulatory Visit: Payer: Self-pay

## 2024-05-13 DIAGNOSIS — E1165 Type 2 diabetes mellitus with hyperglycemia: Secondary | ICD-10-CM

## 2024-05-13 DIAGNOSIS — G9331 Postviral fatigue syndrome: Secondary | ICD-10-CM | POA: Diagnosis not present

## 2024-05-13 LAB — POC COVID19/FLU A&B COMBO
Covid Antigen, POC: NEGATIVE
Influenza A Antigen, POC: NEGATIVE
Influenza B Antigen, POC: NEGATIVE

## 2024-05-13 LAB — POCT RAPID STREP A (OFFICE): Rapid Strep A Screen: NEGATIVE

## 2024-05-13 MED ORDER — PREDNISONE 20 MG PO TABS: 40.0000 mg | ORAL_TABLET | Freq: Every day | ORAL | 0 refills | Status: AC

## 2024-05-13 MED ORDER — AZELASTINE HCL 0.1 % NA SOLN: 1.0000 | Freq: Two times a day (BID) | NASAL | 1 refills | Status: AC

## 2024-05-13 MED ORDER — OZEMPIC (0.25 OR 0.5 MG/DOSE) 2 MG/3ML ~~LOC~~ SOPN
0.5000 mg | PEN_INJECTOR | SUBCUTANEOUS | 0 refills | Status: DC
  Filled 2024-05-13: qty 9, 84d supply, fill #0

## 2024-05-13 MED ORDER — PROMETHAZINE-DM 6.25-15 MG/5ML PO SYRP: 10.0000 mL | ORAL_SOLUTION | Freq: Three times a day (TID) | ORAL | 0 refills | Status: AC | PRN

## 2024-05-13 MED ORDER — LATANOPROST 0.005 % OP SOLN
1.0000 [drp] | Freq: Every evening | OPHTHALMIC | 6 refills | Status: AC
  Filled 2024-05-13: qty 5, 50d supply, fill #0
  Filled 2024-07-28: qty 7.5, 84d supply, fill #1

## 2024-05-13 NOTE — Telephone Encounter (Signed)
 Requesting: OZEMPIC  (0.25 OR 0.5 MG/DOSE) 2 MG/3ML [place]wing SOPN Last Visit: 04/10/2024 Next Visit: 06/04/2024 Last Refill: 02/17/2024  Please Advise

## 2024-05-13 NOTE — ED Provider Notes (Signed)
 [facility]  Note:  This document was prepared using Dragon voice recognition software and may include unintentional dictation errors.  MRN: [id number] DOB: February 04, 1961  Subjective:   Shelly Cohen is a 63 y.o. female presenting for cough, sore throat, nasal congestion x 2 days.  Patient denies any known sick contacts.  Patient states that she has been using Mucinex for symptoms with minimal improvement.  Denies shortness of breath, chest pain, weakness, dizziness, body aches, severe fatigue.  No current facility-administered medications for this encounter.  Current Outpatient Medications:    azelastine (ASTELIN) 0.1 % nasal spray, Place 1 spray into both nostrils 2 (two) times daily. Use in each nostril as directed, Disp: 30 mL, Rfl: 1   predniSONE  (DELTASONE ) 20 MG tablet, Take 2 tablets (40 mg total) by mouth daily for 5 days., Disp: 10 tablet, Rfl: 0   promethazine -dextromethorphan (PROMETHAZINE -DM) 6.25-15 MG/5ML syrup, Take 10 mLs by mouth 3 (three) times daily as needed for cough., Disp: 240 mL, Rfl: 0   albuterol  (VENTOLIN  HFA) 108 (90 Base) MCG/ACT inhaler, Inhale 1-2 puffs into the lungs every 6 (six) hours as needed for wheezing or shortness of breath., Disp: 6.7 g, Rfl: 0   amLODipine  (NORVASC ) 10 MG tablet, Take 1 tablet (10 mg total) by mouth every evening., Disp: 90 tablet, Rfl: 3   aspirin  EC 81 MG tablet, Take 1 tablet (81 mg total) by mouth daily. Swallow whole., Disp: , Rfl:    BIOTIN PO, Take 1 tablet by mouth daily., Disp: , Rfl:    CALCIUM-VITAMIN D  PO, Take 1 tablet by mouth daily., Disp: , Rfl:    carvedilol  (COREG ) 3.125 MG tablet, Take 1 tablet (3.125 mg total) by mouth 2 (two) times daily., Disp: 180 tablet, Rfl: 3   chlorhexidine  (PERIDEX ) 0.12 % solution, Swish 15 mL in mouth for 30 seconds then spit out, use twice daily., Disp: 473 mL, Rfl: 2   diclofenac  Sodium (VOLTAREN ) 1 % GEL, Apply 2 g topically 4 (four) times daily. Rub into  affected area of foot 2 to 4 times daily, Disp: 100 g, Rfl: 2   docusate sodium  (COLACE) 100 MG capsule, Take 100 mg by mouth every other day., Disp: , Rfl:    ezetimibe (ZETIA) 10 MG tablet, Take 1 tablet (10 mg total) by mouth daily., Disp: 30 tablet, Rfl: 5   fluticasone (FLONASE) 50 MCG/ACT nasal spray, Place 1 spray into both nostrils daily as needed for allergies or rhinitis., Disp: , Rfl:    latanoprost  (XALATAN ) 0.005 % ophthalmic solution, Place 1 drop into both eyes every evening., Disp: 5 mL, Rfl: 6   levothyroxine  (SYNTHROID ) 50 MCG tablet, Take 1 tablet (50 mcg total) by mouth daily before breakfast., Disp: 90 tablet, Rfl: 1   lisinopril  (ZESTRIL ) 5 MG tablet, Take 1 tablet (5 mg total) by mouth daily., Disp: 90 tablet, Rfl: 1   MELATONIN PO, Take 10 mg by mouth at bedtime., Disp: , Rfl:    pregabalin  (LYRICA ) 75 MG capsule, Take 1 capsule (75 mg total) by mouth 2 (two) times daily. (Patient taking differently: Take 75 mg by mouth daily.), Disp: 60 capsule, Rfl: 5   Semaglutide ,0.25 or 0.5MG /DOS, (OZEMPIC , 0.25 OR 0.5 MG/DOSE,) 2 MG/3ML SOPN, Inject 0.5 mg into the skin every 7 (seven) days., Disp: 9 mL, Rfl: 0   Allergies  Allergen Reactions   Dilaudid  [Hydromorphone  Hcl] Itching   Diphenhydramine  Hcl Itching    hallucination    Hydrocodone  Nausea Only   Oxycodone  Nausea  And Vomiting   Azithromycin Itching and Rash   Cephalexin Rash   Penicillin G Rash    Did it involve swelling of the face/tongue/throat, SOB, or low BP? No Did it involve sudden or severe rash/hives, skin peeling, or any reaction on the inside of your mouth or nose? No Did you need to seek medical attention at a hospital or doctor's office? No When did it last happen?      childhood If all above answers are "NO", may proceed with cephalosporin use.    Zolpidem  Itching and Rash    Past Medical History:  Diagnosis Date   Allergy    Alpha thalassemia trait    Arthritis    hips   Fibroadenoma     Glaucoma    monitored every 6months, right eye worse than left    History of positive PPD, treatment status unknown    Hypertension    OSA (obstructive sleep apnea)    mild with AHI 8/hr   Pre-diabetes    Thyroid  disease      Past Surgical History:  Procedure Laterality Date   ABDOMINAL HYSTERECTOMY  2013   BREAST BIOPSY  2013   BUNIONECTOMY  10/2014   CHOLECYSTECTOMY  2015   COLONOSCOPY     hallux limi Right 2019   great  toe revision   JOINT REPLACEMENT  08/31/2017   right hip   TOTAL HIP ARTHROPLASTY Right 09/12/2018   Procedure: RIGHT TOTAL HIP ARTHROPLASTY ANTERIOR APPROACH;  Surgeon: Vernetta Lonni GRADE, MD;  Location: [facility];  Service: Orthopedics;  Laterality: Right;   TUBAL LIGATION  1990   UPPER GI ENDOSCOPY N/A 02/20/2021   Procedure: UPPER GI ENDOSCOPY;  Surgeon: Tanda Locus, MD;  Location: [facility];  Service: General;  Laterality: N/A;    Family History  Problem Relation Age of Onset   Hypertension Mother    Anemia Mother    Arthritis Mother        rheumatoid arthritis   Kidney disease Mother    Cataracts Mother    Heart disease Mother    Migraines Sister    Colon polyps Sister    Hypertension Sister    Cataracts Sister    Colon polyps Brother    Hypertension Brother    Glaucoma Brother    Diabetes Brother    Diabetes Brother    Heart disease Brother    Hypertension Brother    Vision loss Brother    Glaucoma Maternal Aunt    Asthma Maternal Aunt    COPD Maternal Aunt    Diabetes Maternal Aunt    Asthma Maternal Aunt    COPD Maternal Aunt    Diabetes Maternal Aunt    Cancer Maternal Grandmother        leukemia   Cancer Maternal Grandfather        lung   Colon cancer Neg Hx    Esophageal cancer Neg Hx    Rectal cancer Neg Hx    Stomach cancer Neg Hx     Social History   Tobacco Use   Smoking status: Never   Smokeless tobacco: Never  Vaping Use   Vaping status: Never Used  Substance Use Topics   Alcohol use: No   Drug use: No     ROS Refer to HPI for ROS details.  Objective:   Vitals: BP (!) 165/89 (BP Location: Right Arm)   Pulse (!) 104   Temp 98 F (36.7 C) (Oral)   Resp 17   SpO2  98%   Physical Exam Vitals and nursing note reviewed.  Constitutional:      General: She is not in acute distress.    Appearance: She is well-developed. She is not ill-appearing or toxic-appearing.  HENT:     Head: Normocephalic and atraumatic.     Nose: Congestion present. No rhinorrhea.     Mouth/Throat:     Mouth: Mucous membranes are moist.     Pharynx: Oropharynx is clear. Posterior oropharyngeal erythema present. No oropharyngeal exudate.  Cardiovascular:     Rate and Rhythm: Normal rate.  Pulmonary:     Effort: Pulmonary effort is normal. No respiratory distress.     Breath sounds: No stridor. No wheezing.  Chest:     Chest wall: No tenderness.  Skin:    General: Skin is warm and dry.  Neurological:     General: No focal deficit present.     Mental Status: She is alert and oriented to person, place, and time.  Psychiatric:        Mood and Affect: Mood normal.        Behavior: Behavior normal.     Procedures  Results for orders placed or performed during the hospital encounter of 05/13/24 (from the past 24 hours)  POCT rapid strep A     Status: Normal   Collection Time: 05/13/24 11:22 AM  Result Value Ref Range   Rapid Strep A Screen Negative Negative  POC Covid19/Flu A&B Antigen     Status: Normal   Collection Time: 05/13/24 11:49 AM  Result Value Ref Range   Influenza A Antigen, POC Negative Negative   Influenza B Antigen, POC Negative Negative   Covid Antigen, POC Negative Negative    No results found.   Assessment and Plan :     Discharge Instructions       1. Postviral syndrome (Primary) - POCT rapid strep A complete and UC is negative for strep pharyngitis - POC Covid19/Flu A&B Antigen complete in UC is negative for COVID and influenza - azelastine (ASTELIN) 0.1 % nasal  spray; Place 1 spray into both nostrils 2 (two) times daily. Use in each nostril as directed  Dispense: 30 mL; Refill: 1 - promethazine -dextromethorphan (PROMETHAZINE -DM) 6.25-15 MG/5ML syrup; Take 10 mLs by mouth 3 (three) times daily as needed for cough.  Dispense: 240 mL; Refill: 0 - predniSONE  (DELTASONE ) 20 MG tablet; Take 2 tablets (40 mg total) by mouth daily for 5 days.  Dispense: 10 tablet; Refill: 0 -Continue to monitor symptoms for any change in severity if there is any escalation of current symptoms or development of new symptoms follow-up in ER for further evaluation and management.      Tasman Zapata B Demetrie Borge   [place]ster Patrone, Lacey B, NP 05/13/24 1226

## 2024-05-13 NOTE — ED Triage Notes (Signed)
 Pt c/o cough, sore throat, congestion since Monday.

## 2024-05-13 NOTE — Discharge Instructions (Signed)
  1. Postviral syndrome (Primary) - POCT rapid strep A complete and UC is negative for strep pharyngitis - POC Covid19/Flu A&B Antigen complete in UC is negative for COVID and influenza - azelastine (ASTELIN) 0.1 % nasal spray; Place 1 spray into both nostrils 2 (two) times daily. Use in each nostril as directed  Dispense: 30 mL; Refill: 1 - promethazine -dextromethorphan (PROMETHAZINE -DM) 6.25-15 MG/5ML syrup; Take 10 mLs by mouth 3 (three) times daily as needed for cough.  Dispense: 240 mL; Refill: 0 - predniSONE  (DELTASONE ) 20 MG tablet; Take 2 tablets (40 mg total) by mouth daily for 5 days.  Dispense: 10 tablet; Refill: 0 -Continue to monitor symptoms for any change in severity if there is any escalation of current symptoms or development of new symptoms follow-up in ER for further evaluation and management.

## 2024-05-14 ENCOUNTER — Other Ambulatory Visit: Payer: Self-pay

## 2024-05-17 ENCOUNTER — Other Ambulatory Visit (HOSPITAL_COMMUNITY): Payer: Self-pay

## 2024-05-18 ENCOUNTER — Other Ambulatory Visit: Payer: Self-pay

## 2024-05-19 ENCOUNTER — Ambulatory Visit (HOSPITAL_COMMUNITY)
Admission: RE | Admit: 2024-05-19 | Discharge: 2024-05-19 | Disposition: A | Discharge status: Home / Self Care | Source: Ambulatory Visit | Attending: Neurology | Admitting: Neurology

## 2024-05-19 DIAGNOSIS — G459 Transient cerebral ischemic attack, unspecified: Secondary | ICD-10-CM | POA: Diagnosis not present

## 2024-05-20 ENCOUNTER — Ambulatory Visit: Payer: Self-pay | Admitting: Neurology

## 2024-05-20 NOTE — Progress Notes (Signed)
 Patient advised.

## 2024-05-26 ENCOUNTER — Other Ambulatory Visit (HOSPITAL_COMMUNITY): Payer: Self-pay

## 2024-05-26 ENCOUNTER — Encounter (HOSPITAL_BASED_OUTPATIENT_CLINIC_OR_DEPARTMENT_OTHER): Payer: Self-pay | Admitting: Internal Medicine

## 2024-05-26 ENCOUNTER — Telehealth: Payer: Self-pay | Admitting: Pharmacy Technician

## 2024-05-26 ENCOUNTER — Ambulatory Visit (INDEPENDENT_AMBULATORY_CARE_PROVIDER_SITE_OTHER): Admitting: Internal Medicine

## 2024-05-26 VITALS — BP 136/82 | HR 89 | Ht 62.0 in | Wt 189.5 lb

## 2024-05-26 DIAGNOSIS — T466X5A Adverse effect of antihyperlipidemic and antiarteriosclerotic drugs, initial encounter: Secondary | ICD-10-CM

## 2024-05-26 DIAGNOSIS — M791 Myalgia, unspecified site: Secondary | ICD-10-CM

## 2024-05-26 DIAGNOSIS — E785 Hyperlipidemia, unspecified: Secondary | ICD-10-CM | POA: Diagnosis not present

## 2024-05-26 DIAGNOSIS — T466X5D Adverse effect of antihyperlipidemic and antiarteriosclerotic drugs, subsequent encounter: Secondary | ICD-10-CM

## 2024-05-26 DIAGNOSIS — G459 Transient cerebral ischemic attack, unspecified: Secondary | ICD-10-CM | POA: Diagnosis not present

## 2024-05-26 DIAGNOSIS — E1169 Type 2 diabetes mellitus with other specified complication: Secondary | ICD-10-CM | POA: Diagnosis not present

## 2024-05-26 MED ORDER — REPATHA SURECLICK 140 MG/ML ~~LOC~~ SOAJ
140.0000 mg | SUBCUTANEOUS | 3 refills | Status: AC
  Filled 2024-05-26: qty 2, 28d supply, fill #0
  Filled 2024-06-22 – 2024-06-29 (×3): qty 2, 28d supply, fill #1
  Filled 2024-07-22 – 2024-07-28 (×2): qty 6, 84d supply, fill #2

## 2024-05-26 NOTE — Progress Notes (Signed)
 LIPID CLINIC CONSULT NOTE  Chief Complaint:  Manage dyslipidemia  Primary Care Physician: Nche, Roselie Rockford, NP  Primary Cardiologist:  Wilbert Bihari, MD  HPI:  Shelly Cohen is a 63 y.o. female who is being seen today for the evaluation of dyslipidemia at the request of Nche, Roselie Rockford, NP.  This is a pleasant 63 year old female kindly referred for evaluation management of dyslipidemia.  She has a history of presumed TIA as well as fibromyalgia and has been statin intolerant which caused worsening myalgias on rosuvastatin and atorvastatin.  Recently she was on ezetimibe  and had intolerance with that as well.  This was stopped around the time she was thought to have a TIA.  She also has type 2 diabetes with A1c 5.9%.  Recent lipid profile showed total cholesterol 205, triglycerides 78, HDL 87 and LDL 102.  PMHx:  Past Medical History:  Diagnosis Date   Allergy    Alpha thalassemia trait    Arthritis    hips   Fibroadenoma    Glaucoma    monitored every 6months, right eye worse than left    History of positive PPD, treatment status unknown    Hypertension    OSA (obstructive sleep apnea)    mild with AHI 8/hr   Pre-diabetes    Thyroid  disease     Past Surgical History:  Procedure Laterality Date   ABDOMINAL HYSTERECTOMY  2013   BREAST BIOPSY  2013   BUNIONECTOMY  10/2014   CHOLECYSTECTOMY  2015   COLONOSCOPY     hallux limi Right 2019   great  toe revision   JOINT REPLACEMENT  08/31/2017   right hip   TOTAL HIP ARTHROPLASTY Right 09/12/2018   Procedure: RIGHT TOTAL HIP ARTHROPLASTY ANTERIOR APPROACH;  Surgeon: Vernetta Lonni GRADE, MD;  Location: [facility];  Service: Orthopedics;  Laterality: Right;   TUBAL LIGATION  1990   UPPER GI ENDOSCOPY N/A 02/20/2021   Procedure: UPPER GI ENDOSCOPY;  Surgeon: Tanda Locus, MD;  Location: [facility];  Service: General;  Laterality: N/A;    FAMHx:  Family History  Problem Relation Age of Onset   Hypertension Mother     Anemia Mother    Arthritis Mother        rheumatoid arthritis   Kidney disease Mother    Cataracts Mother    Heart disease Mother    Migraines Sister    Colon polyps Sister    Hypertension Sister    Cataracts Sister    Colon polyps Brother    Hypertension Brother    Glaucoma Brother    Diabetes Brother    Diabetes Brother    Heart disease Brother    Hypertension Brother    Vision loss Brother    Glaucoma Maternal Aunt    Asthma Maternal Aunt    COPD Maternal Aunt    Diabetes Maternal Aunt    Asthma Maternal Aunt    COPD Maternal Aunt    Diabetes Maternal Aunt    Cancer Maternal Grandmother        leukemia   Cancer Maternal Grandfather        lung   Colon cancer Neg Hx    Esophageal cancer Neg Hx    Rectal cancer Neg Hx    Stomach cancer Neg Hx     SOCHx:   reports that she has never smoked. She has never used smokeless tobacco. She reports that she does not drink alcohol and does not use drugs.  ALLERGIES:  Allergies  Allergen Reactions   Dilaudid  [Hydromorphone  Hcl] Itching   Diphenhydramine  Hcl Itching    hallucination    Hydrocodone  Nausea Only   Oxycodone  Nausea And Vomiting   Azithromycin Itching and Rash   Cephalexin Rash   Penicillin G Rash    Did it involve swelling of the face/tongue/throat, SOB, or low BP? No Did it involve sudden or severe rash/hives, skin peeling, or any reaction on the inside of your mouth or nose? No Did you need to seek medical attention at a hospital or doctor's office? No When did it last happen?      childhood If all above answers are "NO", may proceed with cephalosporin use.    Zolpidem  Itching and Rash    ROS: Pertinent items noted in HPI and remainder of comprehensive ROS otherwise negative.  HOME MEDS: Current Outpatient Medications on File Prior to Visit  Medication Sig Dispense Refill   albuterol  (VENTOLIN  HFA) 108 (90 Base) MCG/ACT inhaler Inhale 1-2 puffs into the lungs every 6 (six) hours as needed for  wheezing or shortness of breath. 6.7 g 0   amLODipine  (NORVASC ) 10 MG tablet Take 1 tablet (10 mg total) by mouth every evening. 90 tablet 3   aspirin  EC 81 MG tablet Take 1 tablet (81 mg total) by mouth daily. Swallow whole.     azelastine  (ASTELIN ) 0.1 % nasal spray Place 1 spray into both nostrils 2 (two) times daily. Use in each nostril as directed 30 mL 1   BIOTIN PO Take 1 tablet by mouth daily.     CALCIUM-VITAMIN D  PO Take 1 tablet by mouth daily.     carvedilol  (COREG ) 3.125 MG tablet Take 1 tablet (3.125 mg total) by mouth 2 (two) times daily. 180 tablet 3   chlorhexidine  (PERIDEX ) 0.12 % solution Swish 15 mL in mouth for 30 seconds then spit out, use twice daily. 473 mL 2   diclofenac  Sodium (VOLTAREN ) 1 % GEL Apply 2 g topically 4 (four) times daily. Rub into affected area of foot 2 to 4 times daily 100 g 2   docusate sodium  (COLACE) 100 MG capsule Take 100 mg by mouth every other day.     ezetimibe  (ZETIA ) 10 MG tablet Take 1 tablet (10 mg total) by mouth daily. 30 tablet 5   fluticasone (FLONASE) 50 MCG/ACT nasal spray Place 1 spray into both nostrils daily as needed for allergies or rhinitis.     latanoprost  (XALATAN ) 0.005 % ophthalmic solution Place 1 drop into both eyes every evening. 5 mL 6   levothyroxine  (SYNTHROID ) 50 MCG tablet Take 1 tablet (50 mcg total) by mouth daily before breakfast. 90 tablet 1   lisinopril  (ZESTRIL ) 5 MG tablet Take 1 tablet (5 mg total) by mouth daily. 90 tablet 1   MELATONIN PO Take 10 mg by mouth at bedtime.     Omega 3 1000 MG CAPS Take 1 capsule by mouth.     pregabalin  (LYRICA ) 75 MG capsule Take 1 capsule (75 mg total) by mouth 2 (two) times daily. (Patient taking differently: Take 75 mg by mouth daily.) 60 capsule 5   promethazine -dextromethorphan (PROMETHAZINE -DM) 6.25-15 MG/5ML syrup Take 10 mLs by mouth 3 (three) times daily as needed for cough. 240 mL 0   Semaglutide ,0.25 or 0.5MG /DOS, (OZEMPIC , 0.25 OR 0.5 MG/DOSE,) 2 MG/3ML SOPN Inject  0.5 mg into the skin every 7 (seven) days. 9 mL 0   No current facility-administered medications on file prior to visit.    LABS/IMAGING: No  results found for this or any previous visit (from the past 48 hours). No results found.  LIPID PANEL:    Component Value Date/Time   CHOL 205 (H) 02/25/2024 0907   TRIG 78.0 02/25/2024 0907   HDL 87.30 02/25/2024 0907   CHOLHDL 2 02/25/2024 0907   VLDL 15.6 02/25/2024 0907   LDLCALC 102 (H) 02/25/2024 0907    No results found for: LIPOA   WEIGHTS: Wt Readings from Last 3 Encounters:  05/26/24 189 lb 8 oz (86 kg)  04/22/24 186 lb (84.4 kg)  04/10/24 189 lb 9.6 oz (86 kg)    VITALS: BP 136/82   Pulse 89   Ht 5' 2 (1.575 m)   Wt 189 lb 8 oz (86 kg)   SpO2 97%   BMI 34.66 kg/m   EXAM: Deferred  EKG: Deferred  ASSESSMENT: Dyslipidemia, goal LDL less than 55 Type 2 diabetes-A1c 5.9% History of possible TIA Fibromyalgia Statin and ezetimibe  intolerant-myalgias  PLAN: 1.   Ms. Luzader has a history of diabetes and possible TIA and has been intolerant to statins and ezetimibe .  She is a good candidate for PCSK9 inhibitor.  Will reach out for prior authorization for Repatha .  Plan repeat lipids including NMR and LP(a) in about 3 months.  Thanks again for the kind referral.  Vinie KYM Maxcy, MD, [facility], FNLA, FACP  [place]  [facility]  Medical Director of the [facility] Lipid Disorders &  Cardiovascular Risk Reduction Clinic Diplomate of the American Board of Clinical Lipidology Attending Cardiologist  Direct Dial: [phone]  Fax: [phone]  Website:  [email or web address] Vinie BROCKS Shalisha Clausing 05/26/2024, 12:10 PM

## 2024-05-26 NOTE — Telephone Encounter (Signed)
 Pharmacy Patient Advocate Encounter   Received notification from Physician's Office that prior authorization for repatha  is required/requested.   Insurance verification completed.   The patient is insured through [facility].   Per test claim: PA required; PA submitted to above mentioned insurance via Latent Key/confirmation #/EOC [id number] Status is pending

## 2024-05-26 NOTE — Patient Instructions (Signed)
 Medication Instructions:  Dr. Mona recommends Repatha (PCSK9). This is an injectable cholesterol medication self-administered once every 14 days. This medication will likely need prior approval with your insurance company, which we will work on. If the medication is not approved initially, we may need to do an appeal with your insurance. If approved, we will provide you with copay and cost information. We'll then send the prescription to your pharmacy. We would have you complete another set of fasting labs between 3-4 months to reassess cholesterol.   Repatha is self-injected once every 14 days in subcutaneous or fatty tissue - such as belly or side/outer/upper thigh. It is best stored in the refrigerator but is stable at room temp up to 28 days. Please take the pen-injector out of fridge about 30 minutes - 1 hour prior to injection, to allow it to warm closer to room temperature.   This medication is very effective in lowering LDL and can lower LPa, as Dr. Mona mentioned. It is also generally well tolerated -- most common reaction may be cold-like symptoms such as runny nose, scratchy throat, as this is an antibody therapy. It is generally self-limiting and after a few doses, your body should have normalized to the medication.   Here is a demo video: [email or web address]   If you need a co-pay card for Repatha: lawsponsor.fr  Patient Assistance:    These foundations have funds at various times.   The [employer]: https://www.panfoundation.org/disease-funds/hypercholesterolemia/ -- can sign up for wait list  [facility] offers assistance to help pay for medication copays.  They will cover copays for all cholesterol lowering meds, including statins, fibrates, omega-3 fish oils like Vascepa, ezetimibe, Repatha, Praluent, Nexletol, Nexlizet.  The cards are usually good for $2,500 or 12 months, whichever comes first. Our fax #  is [phone] (you will need this to apply) Go to healthwellfoundation.org Click on "Apply Now" Answer questions as to whom is applying (patient or representative) Your disease fund will be "hypercholesterolemia - Medicare access" They will ask questions about finances and which medications you are taking for cholesterol When you submit, the approval is usually within minutes.  You will need to print the card information from the site You will need to show this information to your pharmacy, they will bill your Medicare Part D plan first -then bill [employer] --for the copay.   You can also call them at [phone], although the hold times can be quite long.    *If you need a refill on your cardiac medications before your next appointment, please call your pharmacy*  Lab Work: Your physician recommends that you return for lab work in: 3 months   NMR Lipoprofile and Lp(a)  If you have labs (blood work) drawn today and your tests are completely normal, you will receive your results only by: MyChart Message (if you have MyChart) OR A paper copy in the mail If you have any lab test that is abnormal or we need to change your treatment, we will call you to review the results.  Testing/Procedures: none  Follow-Up: As needed

## 2024-05-27 ENCOUNTER — Other Ambulatory Visit (HOSPITAL_COMMUNITY): Payer: Self-pay

## 2024-05-27 NOTE — Telephone Encounter (Signed)
 Pharmacy Patient Advocate Encounter  Received notification from [employer] that Prior Authorization for Repatha  has been APPROVED from 05/26/24 to 05/25/25. Ran test claim, Copay is $24.99- one month. This test claim was processed through [facility]- copay amounts may vary at other pharmacies due to pharmacy/plan contracts, or as the patient moves through the different stages of their insurance plan.   PA #/Case ID/Reference #: [id number]

## 2024-05-30 DIAGNOSIS — G459 Transient cerebral ischemic attack, unspecified: Secondary | ICD-10-CM

## 2024-06-01 ENCOUNTER — Encounter (HOSPITAL_BASED_OUTPATIENT_CLINIC_OR_DEPARTMENT_OTHER): Payer: Self-pay | Admitting: *Deleted

## 2024-06-01 ENCOUNTER — Other Ambulatory Visit: Payer: Self-pay

## 2024-06-01 ENCOUNTER — Other Ambulatory Visit (HOSPITAL_COMMUNITY): Payer: Self-pay

## 2024-06-04 ENCOUNTER — Other Ambulatory Visit (HOSPITAL_COMMUNITY): Payer: Self-pay

## 2024-06-04 ENCOUNTER — Ambulatory Visit: Admitting: Nurse Practitioner

## 2024-06-04 ENCOUNTER — Encounter: Payer: Self-pay | Admitting: Nurse Practitioner

## 2024-06-04 VITALS — BP 138/76 | HR 88 | Temp 97.9°F | Ht 62.0 in | Wt 191.2 lb

## 2024-06-04 DIAGNOSIS — E785 Hyperlipidemia, unspecified: Secondary | ICD-10-CM | POA: Diagnosis not present

## 2024-06-04 DIAGNOSIS — M47816 Spondylosis without myelopathy or radiculopathy, lumbar region: Secondary | ICD-10-CM | POA: Insufficient documentation

## 2024-06-04 DIAGNOSIS — E039 Hypothyroidism, unspecified: Secondary | ICD-10-CM

## 2024-06-04 DIAGNOSIS — I10 Essential (primary) hypertension: Secondary | ICD-10-CM

## 2024-06-04 DIAGNOSIS — Z7985 Long-term (current) use of injectable non-insulin antidiabetic drugs: Secondary | ICD-10-CM

## 2024-06-04 DIAGNOSIS — Z0001 Encounter for general adult medical examination with abnormal findings: Secondary | ICD-10-CM

## 2024-06-04 DIAGNOSIS — Z0000 Encounter for general adult medical examination without abnormal findings: Secondary | ICD-10-CM

## 2024-06-04 DIAGNOSIS — M797 Fibromyalgia: Secondary | ICD-10-CM | POA: Insufficient documentation

## 2024-06-04 DIAGNOSIS — M791 Myalgia, unspecified site: Secondary | ICD-10-CM

## 2024-06-04 DIAGNOSIS — E1169 Type 2 diabetes mellitus with other specified complication: Secondary | ICD-10-CM | POA: Diagnosis not present

## 2024-06-04 DIAGNOSIS — M47817 Spondylosis without myelopathy or radiculopathy, lumbosacral region: Secondary | ICD-10-CM | POA: Insufficient documentation

## 2024-06-04 DIAGNOSIS — M2550 Pain in unspecified joint: Secondary | ICD-10-CM | POA: Insufficient documentation

## 2024-06-04 MED ORDER — PREGABALIN 75 MG PO CAPS
75.0000 mg | ORAL_CAPSULE | Freq: Every day | ORAL | 1 refills | Status: DC
  Filled 2024-06-04: qty 90, 90d supply, fill #0

## 2024-06-04 MED ORDER — LEVOTHYROXINE SODIUM 50 MCG PO TABS
50.0000 ug | ORAL_TABLET | Freq: Every day | ORAL | 1 refills | Status: AC
  Filled 2024-06-04 – 2024-07-28 (×2): qty 90, 90d supply, fill #0

## 2024-06-04 MED ORDER — LISINOPRIL 5 MG PO TABS
5.0000 mg | ORAL_TABLET | Freq: Every day | ORAL | 3 refills | Status: AC
  Filled 2024-06-04: qty 90, 90d supply, fill #0

## 2024-06-04 MED ORDER — OZEMPIC (0.25 OR 0.5 MG/DOSE) 2 MG/3ML ~~LOC~~ SOPN
0.5000 mg | PEN_INJECTOR | SUBCUTANEOUS | 1 refills | Status: AC
  Filled 2024-06-04 – 2024-08-01 (×2): qty 9, 84d supply, fill #0

## 2024-06-04 NOTE — Assessment & Plan Note (Signed)
 Controlled with ozempic  Normal foot exam BP at goal UTD with eye exam No neuropathy Statin and zetia  intolerance Under the care of Dr. Mona. Plans to start use of repatha  F/up in 3months

## 2024-06-04 NOTE — Progress Notes (Signed)
 Complete physical exam  Patient: Shelly Cohen   DOB: 1961-04-03   63 y.o. Female  MRN: [id number] Visit Date: 06/04/2024  Subjective:    Chief Complaint  Patient presents with   Annual Exam    FASTING  DUE for a Foot Exam    Shelly Cohen is a 63 y.o. female who presents today for a complete physical exam. She reports consuming a low fat and low sodium diet. Walking daily She generally feels well. She reports sleeping well. She does have additional problems to discuss today.  Vision:Yes Dental:Yes STD Screen:No  BP Readings from Last 3 Encounters:  06/04/24 138/76  05/26/24 136/82  05/13/24 (!) 165/89   Wt Readings from Last 3 Encounters:  06/04/24 191 lb 3.2 oz (86.7 kg)  05/26/24 189 lb 8 oz (86 kg)  04/22/24 186 lb (84.4 kg)    Most recent fall risk assessment:    06/04/2024    8:10 AM  Fall Risk   Falls in the past year? 0  Number falls in past yr: 0  Injury with Fall? 0  Risk for fall due to : No Fall Risks  Follow up Falls evaluation completed    Depression screen:Yes - No Depression Most recent depression screenings:    06/04/2024    8:10 AM 05/20/2023    7:59 AM  PHQ 2/9 Scores  PHQ - 2 Score 0 0  PHQ- 9 Score 0    HPI  HTN (hypertension), benign  BP at goal in office today Hx of white coat syndrome Compliant with DASH diet and current med doses BP Readings from Last 3 Encounters:  06/04/24 138/76  05/26/24 136/82  05/13/24 (!) 165/89    Maintain med doses F/up in 6months  Hypothyroidism Repeat TSH and T4 Maintain levothyroxine  dose  DM (diabetes mellitus) (HCC) Controlled with ozempic  Normal foot exam BP at goal UTD with eye exam No neuropathy Statin and zetia  intolerance Under the care of Dr. Mona. Plans to start use of repatha  F/up in 3months  Hyperlipidemia associated with type 2 diabetes mellitus (HCC) Unable to tolerate zetia  and statin Under the care of Dr. Mona   Past Medical History:  Diagnosis  Date   Allergy    Alpha thalassemia trait    Arthritis    hips   Fibroadenoma    Glaucoma    monitored every 6months, right eye worse than left    History of positive PPD, treatment status unknown    Hypertension    OSA (obstructive sleep apnea)    mild with AHI 8/hr   Pre-diabetes    Thyroid  disease    Past Surgical History:  Procedure Laterality Date   ABDOMINAL HYSTERECTOMY  2013   BREAST BIOPSY  2013   BUNIONECTOMY  10/2014   CHOLECYSTECTOMY  2015   COLONOSCOPY     hallux limi Right 2019   great  toe revision   JOINT REPLACEMENT  08/31/2017   right hip   TOTAL HIP ARTHROPLASTY Right 09/12/2018   Procedure: RIGHT TOTAL HIP ARTHROPLASTY ANTERIOR APPROACH;  Surgeon: Vernetta Lonni GRADE, MD;  Location: [facility];  Service: Orthopedics;  Laterality: Right;   TUBAL LIGATION  1990   UPPER GI ENDOSCOPY N/A 02/20/2021   Procedure: UPPER GI ENDOSCOPY;  Surgeon: Tanda Locus, MD;  Location: [facility];  Service: General;  Laterality: N/A;   Social History   Socioeconomic History   Marital status: Married    Spouse name: Not on file   Number of children: Not  on file   Years of education: Not on file   Highest education level: Associate degree: academic program  Occupational History   Not on file  Tobacco Use   Smoking status: Never   Smokeless tobacco: Never  Vaping Use   Vaping status: Never Used  Substance and Sexual Activity   Alcohol use: No   Drug use: No   Sexual activity: Yes    Birth control/protection: Surgical  Other Topics Concern   Not on file  Social History Narrative   Right handed.    Social Drivers of Corporate Investment Banker Strain: Low Risk  (06/01/2024)   Overall Financial Resource Strain (CARDIA)    Difficulty of Paying Living Expenses: Not hard at all  Food Insecurity: No Food Insecurity (06/01/2024)   Hunger Vital Sign    Worried About Running Out of Food in the Last Year: Never true    Ran Out of Food in the Last Year: Never true   Transportation Needs: No Transportation Needs (06/01/2024)   PRAPARE - Administrator, Civil Service (Medical): No    Lack of Transportation (Non-Medical): No  Physical Activity: Insufficiently Active (06/01/2024)   Exercise Vital Sign    Days of Exercise per Week: 2 days    Minutes of Exercise per Session: 30 min  Stress: No Stress Concern Present (06/01/2024)   [employer] of Occupational Health - Occupational Stress Questionnaire    Feeling of Stress: Not at all  Social Connections: Moderately Integrated (06/01/2024)   Social Connection and Isolation Panel    Frequency of Communication with Friends and Family: More than three times a week    Frequency of Social Gatherings with Friends and Family: More than three times a week    Attends Religious Services: More than 4 times per year    Active Member of [employer] or Organizations: Yes    Attends Engineer, Structural: More than 4 times per year    Marital Status: Separated  Intimate Partner Violence: Not on file   Family Status  Relation Name Status   Mother Levorn Lesches Deceased   Father  Alive   Sister Lonell Mau Alive   Brother [place] Deceased   Brother Prentice Lesches Alive   Brother [employer] Alive   Mat Aunt  Alive   Mat Aunt [place] Alive   Mat Aunt Lebron Conger Alive   MGM August Lesches (Not Specified)   MGF [facility] (Not Specified)   Neg Hx  (Not Specified)  No partnership data on file   Family History  Problem Relation Age of Onset   Hypertension Mother    Anemia Mother    Arthritis Mother        rheumatoid arthritis   Kidney disease Mother    Cataracts Mother    Heart disease Mother    Migraines Sister    Colon polyps Sister    Hypertension Sister    Cataracts Sister    Colon polyps Brother    Hypertension Brother    Glaucoma Brother    Diabetes Brother    Diabetes Brother    Heart disease Brother    Hypertension Brother    Vision loss Brother    Glaucoma  Maternal Aunt    Asthma Maternal Aunt    COPD Maternal Aunt    Diabetes Maternal Aunt    Asthma Maternal Aunt    COPD Maternal Aunt    Diabetes Maternal Aunt    Cancer Maternal  Grandmother        leukemia   Cancer Maternal Grandfather        lung   Colon cancer Neg Hx    Esophageal cancer Neg Hx    Rectal cancer Neg Hx    Stomach cancer Neg Hx    Allergies  Allergen Reactions   Dilaudid  [Hydromorphone  Hcl] Itching   Diphenhydramine  Hcl Itching    hallucination    Hydrocodone  Nausea Only   Oxycodone  Nausea And Vomiting   Azithromycin Itching and Rash   Cephalexin Rash   Penicillin G Rash    Did it involve swelling of the face/tongue/throat, SOB, or low BP? No Did it involve sudden or severe rash/hives, skin peeling, or any reaction on the inside of your mouth or nose? No Did you need to seek medical attention at a hospital or doctor's office? No When did it last happen?      childhood If all above answers are "NO", may proceed with cephalosporin use.    Zolpidem  Itching and Rash    Patient Care Team: Belina Mandile, Roselie Rockford, NP as PCP - General (Internal Medicine) Shlomo Wilbert SAUNDERS, MD as PCP - Cardiology [facility]    Medications: Outpatient Medications Prior to Visit  Medication Sig   albuterol  (VENTOLIN  HFA) 108 (90 Base) MCG/ACT inhaler Inhale 1-2 puffs into the lungs every 6 (six) hours as needed for wheezing or shortness of breath.   amLODipine  (NORVASC ) 10 MG tablet Take 1 tablet (10 mg total) by mouth every evening.   aspirin  EC 81 MG tablet Take 1 tablet (81 mg total) by mouth daily. Swallow whole.   azelastine  (ASTELIN ) 0.1 % nasal spray Place 1 spray into both nostrils 2 (two) times daily. Use in each nostril as directed   BIOTIN PO Take 1 tablet by mouth daily.   CALCIUM-VITAMIN D  PO Take 1 tablet by mouth daily.   carvedilol  (COREG ) 3.125 MG tablet Take 1 tablet (3.125 mg total) by mouth 2 (two) times daily.    chlorhexidine  (PERIDEX ) 0.12 % solution Swish 15 mL in mouth for 30 seconds then spit out, use twice daily.   diclofenac  Sodium (VOLTAREN ) 1 % GEL Apply 2 g topically 4 (four) times daily. Rub into affected area of foot 2 to 4 times daily   docusate sodium  (COLACE) 100 MG capsule Take 100 mg by mouth every other day.   Evolocumab  (REPATHA  SURECLICK) 140 MG/ML SOAJ Inject 140 mg into the skin every 14 (fourteen) days.   fluticasone (FLONASE) 50 MCG/ACT nasal spray Place 1 spray into both nostrils daily as needed for allergies or rhinitis.   latanoprost  (XALATAN ) 0.005 % ophthalmic solution Place 1 drop into both eyes every evening.   MELATONIN PO Take 10 mg by mouth at bedtime.   Omega 3 1000 MG CAPS Take 1 capsule by mouth.   promethazine -dextromethorphan (PROMETHAZINE -DM) 6.25-15 MG/5ML syrup Take 10 mLs by mouth 3 (three) times daily as needed for cough.   [DISCONTINUED] ezetimibe  (ZETIA ) 10 MG tablet Take 1 tablet (10 mg total) by mouth daily.   [DISCONTINUED] levothyroxine  (SYNTHROID ) 50 MCG tablet Take 1 tablet (50 mcg total) by mouth daily before breakfast.   [DISCONTINUED] lisinopril  (ZESTRIL ) 5 MG tablet Take 1 tablet (5 mg total) by mouth daily.   [DISCONTINUED] pregabalin  (LYRICA ) 75 MG capsule Take 1 capsule (75 mg total) by mouth 2 (two) times daily. (Patient taking differently: Take 75 mg by mouth daily.)   [DISCONTINUED] Semaglutide ,0.25 or 0.5MG /DOS, (OZEMPIC , 0.25 OR  0.5 MG/DOSE,) 2 MG/3ML SOPN Inject 0.5 mg into the skin every 7 (seven) days.   No facility-administered medications prior to visit.    Review of Systems  Constitutional:  Negative for activity change, appetite change and unexpected weight change.  Respiratory: Negative.    Cardiovascular: Negative.   Gastrointestinal: Negative.   Endocrine: Negative for cold intolerance and heat intolerance.  Genitourinary: Negative.   Musculoskeletal:  Positive for arthralgias and myalgias.  Skin: Negative.   Neurological:  Negative.   Hematological: Negative.   Psychiatric/Behavioral:  Negative for behavioral problems, decreased concentration, dysphoric mood, hallucinations, self-injury, sleep disturbance and suicidal ideas. The patient is not nervous/anxious.         Objective:  BP 138/76 (BP Location: Left Arm, Patient Position: Sitting, Cuff Size: Large)   Pulse 88   Temp 97.9 F (36.6 C) (Oral)   Ht 5' 2 (1.575 m)   Wt 191 lb 3.2 oz (86.7 kg)   SpO2 100%   BMI 34.97 kg/m     Physical Exam Vitals and nursing note reviewed.  Constitutional:      General: She is not in acute distress.    Appearance: She is obese.  HENT:     Right Ear: Tympanic membrane, ear canal and external ear normal.     Left Ear: Tympanic membrane, ear canal and external ear normal.     Nose: Nose normal.  Eyes:     Extraocular Movements: Extraocular movements intact.     Conjunctiva/sclera: Conjunctivae normal.     Pupils: Pupils are equal, round, and reactive to light.  Neck:     Thyroid : No thyroid  mass, thyromegaly or thyroid  tenderness.  Cardiovascular:     Rate and Rhythm: Normal rate and regular rhythm.     Pulses: Normal pulses.     Heart sounds: Normal heart sounds.  Pulmonary:     Effort: Pulmonary effort is normal.     Breath sounds: Normal breath sounds.  Abdominal:     General: Bowel sounds are normal.     Palpations: Abdomen is soft.  Musculoskeletal:     Cervical back: Normal range of motion and neck supple.     Right hip: Tenderness present. Decreased range of motion. Decreased strength.     Left hip: Normal.     Right upper leg: Normal.     Right knee: No swelling, deformity or effusion. Decreased range of motion. Tenderness present over the medial joint line and lateral joint line.     Right lower leg: No edema.     Left lower leg: No edema.     Right ankle: Normal.     Right Achilles Tendon: Normal.     Left ankle: Normal.     Left Achilles Tendon: Normal.     Right foot: Normal.      Left foot: Normal.  Lymphadenopathy:     Cervical: No cervical adenopathy.  Skin:    General: Skin is warm and dry.  Neurological:     Mental Status: She is alert and oriented to person, place, and time.     Cranial Nerves: No cranial nerve deficit.  Psychiatric:        Mood and Affect: Mood normal.        Behavior: Behavior normal.        Thought Content: Thought content normal.      No results found for any visits on 06/04/24.    Assessment & Plan:    Routine Health Maintenance and Physical Exam  Immunization History  Administered Date(s) Administered    sv, Bivalent, Protein Subunit Rsvpref,pf (Abrysvo) 04/18/2022   Influenza, Mdck, Trivalent,PF 6+ MOS(egg free) 03/28/2024   Influenza-Unspecified 03/19/2016, 03/19/2018, 03/24/2019, 03/22/2020, 03/24/2021, 03/24/2022, 03/22/2023   PFIZER Comirnaty(Gray Top)Covid-19 Tri-Sucrose Vaccine 10/21/2020   PFIZER(Purple Top)SARS-COV-2 Vaccination 06/30/2019, 07/21/2019, 04/01/2020, 10/21/2020   PNEUMOCOCCAL CONJUGATE-20 08/27/2023   Pfizer Covid-19 Vaccine Bivalent Booster 65yrs & up 04/12/2021   Pfizer(Comirnaty)Fall Seasonal Vaccine 12 years and older 04/18/2022, 05/02/2022, 04/09/2024   Tdap 12/15/2015, 12/26/2017   Zoster Recombinant(Shingrix ) 08/11/2019, 10/20/2019   Health Maintenance  Topic Date Due   HEMOGLOBIN A1C  08/27/2024   Diabetic kidney evaluation - Urine ACR  10/01/2024   OPHTHALMOLOGY EXAM  11/25/2024   Diabetic kidney evaluation - eGFR measurement  04/02/2025   FOOT EXAM  06/04/2025   Mammogram  12/08/2025   DTaP/Tdap/Td (3 - Td or Tdap) 12/27/2027   Colonoscopy  04/11/2033   Pneumococcal Vaccine: 50+ Years  Completed   Influenza Vaccine  Completed   COVID-19 Vaccine  Completed   Hepatitis C Screening  Completed   HIV Screening  Completed   Zoster Vaccines- Shingrix   Completed   Hepatitis B Vaccines 19-59 Average Risk  Aged Out   HPV VACCINES  Aged Out   Meningococcal B Vaccine  Aged Out   Discussed  health benefits of physical activity, and encouraged her to engage in regular exercise appropriate for her age and condition.  Problem List Items Addressed This Visit     DM (diabetes mellitus) (HCC)   Controlled with ozempic  Normal foot exam BP at goal UTD with eye exam No neuropathy Statin and zetia  intolerance Under the care of Dr. Mona. Plans to start use of repatha  F/up in 3months      Relevant Medications   lisinopril  (ZESTRIL ) 5 MG tablet   Semaglutide ,0.25 or 0.5MG /DOS, (OZEMPIC , 0.25 OR 0.5 MG/DOSE,) 2 MG/3ML SOPN   HTN (hypertension), benign    BP at goal in office today Hx of white coat syndrome Compliant with DASH diet and current med doses BP Readings from Last 3 Encounters:  06/04/24 138/76  05/26/24 136/82  05/13/24 (!) 165/89    Maintain med doses F/up in 6months      Relevant Medications   lisinopril  (ZESTRIL ) 5 MG tablet   Hyperlipidemia associated with type 2 diabetes mellitus (HCC)   Unable to tolerate zetia  and statin Under the care of Dr. Mona      Relevant Medications   lisinopril  (ZESTRIL ) 5 MG tablet   Semaglutide ,0.25 or 0.5MG /DOS, (OZEMPIC , 0.25 OR 0.5 MG/DOSE,) 2 MG/3ML SOPN   Hypothyroidism   Repeat TSH and T4 Maintain levothyroxine  dose      Relevant Medications   levothyroxine  (SYNTHROID ) 50 MCG tablet   Myofascial pain   Relevant Medications   pregabalin  (LYRICA ) 75 MG capsule   Other Visit Diagnoses       Encounter for preventative adult health care exam with abnormal findings    -  Primary      Return in about 3 months (around 09/02/2024) for HTN, DM, Hypothyroidism, hyperlipidemia (fasting).     Roselie Mood, NP

## 2024-06-04 NOTE — Assessment & Plan Note (Signed)
 BP at goal in office today Hx of white coat syndrome Compliant with DASH diet and current med doses BP Readings from Last 3 Encounters:  06/04/24 138/76  05/26/24 136/82  05/13/24 (!) 165/89    Maintain med doses F/up in 6months

## 2024-06-04 NOTE — Assessment & Plan Note (Signed)
 Unable to tolerate zetia  and statin Under the care of Dr. Mona

## 2024-06-04 NOTE — Assessment & Plan Note (Signed)
 Repeat TSH and T4 Maintain levothyroxine dose

## 2024-06-04 NOTE — Patient Instructions (Signed)
 Maintain Heart healthy diet and daily exercise. Maintain current medications.

## 2024-06-05 ENCOUNTER — Other Ambulatory Visit (HOSPITAL_COMMUNITY): Payer: Self-pay

## 2024-06-05 ENCOUNTER — Other Ambulatory Visit: Payer: Self-pay

## 2024-06-19 ENCOUNTER — Encounter: Payer: Self-pay | Admitting: Nurse Practitioner

## 2024-06-19 NOTE — Telephone Encounter (Signed)
 Called patient and she stated that she wants to know should she add back the morning dose of the Lyrica  dose to her regimen?

## 2024-06-21 ENCOUNTER — Other Ambulatory Visit: Payer: Self-pay | Admitting: Family

## 2024-06-21 DIAGNOSIS — M791 Myalgia, unspecified site: Secondary | ICD-10-CM

## 2024-06-21 MED ORDER — PREGABALIN 75 MG PO CAPS
75.0000 mg | ORAL_CAPSULE | Freq: Two times a day (BID) | ORAL | 1 refills | Status: AC
  Filled 2024-06-21: qty 180, 90d supply, fill #0

## 2024-06-22 ENCOUNTER — Other Ambulatory Visit (HOSPITAL_COMMUNITY): Payer: Self-pay

## 2024-06-22 ENCOUNTER — Other Ambulatory Visit: Payer: Self-pay

## 2024-06-23 ENCOUNTER — Other Ambulatory Visit (HOSPITAL_BASED_OUTPATIENT_CLINIC_OR_DEPARTMENT_OTHER): Payer: Self-pay

## 2024-06-23 ENCOUNTER — Other Ambulatory Visit: Payer: Self-pay

## 2024-06-29 ENCOUNTER — Other Ambulatory Visit (HOSPITAL_COMMUNITY): Payer: Self-pay

## 2024-06-29 ENCOUNTER — Other Ambulatory Visit: Payer: Self-pay

## 2024-07-10 ENCOUNTER — Telehealth

## 2024-07-10 ENCOUNTER — Other Ambulatory Visit (HOSPITAL_COMMUNITY): Payer: Self-pay

## 2024-07-10 ENCOUNTER — Telehealth: Admitting: Physician Assistant

## 2024-07-10 ENCOUNTER — Encounter

## 2024-07-10 ENCOUNTER — Other Ambulatory Visit (HOSPITAL_BASED_OUTPATIENT_CLINIC_OR_DEPARTMENT_OTHER): Payer: Self-pay

## 2024-07-10 DIAGNOSIS — J069 Acute upper respiratory infection, unspecified: Secondary | ICD-10-CM | POA: Diagnosis not present

## 2024-07-10 MED ORDER — BENZONATATE 100 MG PO CAPS
100.0000 mg | ORAL_CAPSULE | Freq: Three times a day (TID) | ORAL | 0 refills | Status: AC | PRN
  Filled 2024-07-10: qty 30, 5d supply, fill #0

## 2024-07-10 MED ORDER — LIDOCAINE VISCOUS HCL 2 % MT SOLN
5.0000 mL | Freq: Four times a day (QID) | OROMUCOSAL | 0 refills | Status: AC | PRN
  Filled 2024-07-10: qty 100, 3d supply, fill #0

## 2024-07-10 NOTE — Progress Notes (Signed)

## 2024-07-11 ENCOUNTER — Telehealth

## 2024-07-22 ENCOUNTER — Other Ambulatory Visit (HOSPITAL_COMMUNITY): Payer: Self-pay

## 2024-07-22 ENCOUNTER — Other Ambulatory Visit (HOSPITAL_BASED_OUTPATIENT_CLINIC_OR_DEPARTMENT_OTHER): Payer: Self-pay

## 2024-07-22 ENCOUNTER — Other Ambulatory Visit: Payer: Self-pay

## 2024-07-23 ENCOUNTER — Other Ambulatory Visit: Payer: Self-pay

## 2024-07-27 ENCOUNTER — Other Ambulatory Visit: Payer: Self-pay

## 2024-07-27 ENCOUNTER — Encounter: Admitting: Internal Medicine

## 2024-07-29 ENCOUNTER — Other Ambulatory Visit: Payer: Self-pay

## 2024-07-29 ENCOUNTER — Encounter: Payer: Self-pay | Admitting: Pharmacy Technician

## 2024-07-29 ENCOUNTER — Other Ambulatory Visit (HOSPITAL_COMMUNITY): Payer: Self-pay

## 2024-07-30 ENCOUNTER — Other Ambulatory Visit: Payer: Self-pay

## 2024-07-30 ENCOUNTER — Other Ambulatory Visit (HOSPITAL_COMMUNITY): Payer: Self-pay

## 2024-08-01 ENCOUNTER — Other Ambulatory Visit (HOSPITAL_COMMUNITY): Payer: Self-pay

## 2024-09-04 ENCOUNTER — Ambulatory Visit: Admitting: Nurse Practitioner

## 2024-12-07 ENCOUNTER — Ambulatory Visit: Admitting: Neurology
